# Patient Record
Sex: Male | Born: 1945 | Race: White | Hispanic: No | Marital: Married | Smoking: Current every day smoker
Health system: Southern US, Community
[De-identification: ages and names within clinical notes are randomized; demographics above are authoritative.]

## PROBLEM LIST (undated history)

## (undated) DIAGNOSIS — E785 Hyperlipidemia, unspecified: Secondary | ICD-10-CM

## (undated) DIAGNOSIS — Z720 Tobacco use: Secondary | ICD-10-CM

## (undated) DIAGNOSIS — G894 Chronic pain syndrome: Secondary | ICD-10-CM

## (undated) DIAGNOSIS — M4716 Other spondylosis with myelopathy, lumbar region: Secondary | ICD-10-CM

## (undated) DIAGNOSIS — M51369 Other intervertebral disc degeneration, lumbar region without mention of lumbar back pain or lower extremity pain: Secondary | ICD-10-CM

## (undated) DIAGNOSIS — M542 Cervicalgia: Secondary | ICD-10-CM

## (undated) DIAGNOSIS — I10 Essential (primary) hypertension: Secondary | ICD-10-CM

## (undated) DIAGNOSIS — I509 Heart failure, unspecified: Secondary | ICD-10-CM

## (undated) DIAGNOSIS — C8510 Unspecified B-cell lymphoma, unspecified site: Secondary | ICD-10-CM

## (undated) DIAGNOSIS — R7303 Prediabetes: Secondary | ICD-10-CM

## (undated) DIAGNOSIS — I739 Peripheral vascular disease, unspecified: Secondary | ICD-10-CM

## (undated) DIAGNOSIS — I2510 Atherosclerotic heart disease of native coronary artery without angina pectoris: Secondary | ICD-10-CM

## (undated) DIAGNOSIS — L8990 Pressure ulcer of unspecified site, unspecified stage: Secondary | ICD-10-CM

## (undated) DIAGNOSIS — J449 Chronic obstructive pulmonary disease, unspecified: Secondary | ICD-10-CM

## (undated) DIAGNOSIS — I350 Nonrheumatic aortic (valve) stenosis: Secondary | ICD-10-CM

## (undated) DIAGNOSIS — M5136 Other intervertebral disc degeneration, lumbar region: Secondary | ICD-10-CM

## (undated) DIAGNOSIS — G8929 Other chronic pain: Secondary | ICD-10-CM

## (undated) HISTORY — DX: Chronic obstructive pulmonary disease, unspecified: J44.9

## (undated) HISTORY — DX: Cervicalgia: M54.2

## (undated) HISTORY — DX: Tobacco use: Z72.0

## (undated) HISTORY — DX: Hyperlipidemia, unspecified: E78.5

## (undated) HISTORY — PX: VSD REPAIR: SHX276

## (undated) HISTORY — PX: CORONARY ARTERY BYPASS GRAFT: SHX141

## (undated) HISTORY — DX: Chronic pain syndrome: G89.4

## (undated) HISTORY — DX: Unspecified B-cell lymphoma, unspecified site: C85.10

## (undated) HISTORY — DX: Other intervertebral disc degeneration, lumbar region without mention of lumbar back pain or lower extremity pain: M51.369

## (undated) HISTORY — PX: CORONARY ANGIOPLASTY: SHX604

## (undated) HISTORY — DX: Other spondylosis with myelopathy, lumbar region: M47.16

## (undated) HISTORY — DX: Peripheral vascular disease, unspecified: I73.9

## (undated) HISTORY — DX: Atherosclerotic heart disease of native coronary artery without angina pectoris: I25.10

## (undated) HISTORY — DX: Heart failure, unspecified: I50.9

## (undated) HISTORY — DX: Other intervertebral disc degeneration, lumbar region: M51.36

## (undated) HISTORY — DX: Essential (primary) hypertension: I10

## (undated) HISTORY — DX: Other chronic pain: G89.29

## (undated) HISTORY — DX: Prediabetes: R73.03

## (undated) HISTORY — PX: OTHER SURGICAL HISTORY: SHX169

## (undated) HISTORY — DX: Pressure ulcer of unspecified site, unspecified stage: L89.90

## (undated) HISTORY — DX: Nonrheumatic aortic (valve) stenosis: I35.0

---

## 2004-01-30 ENCOUNTER — Ambulatory Visit: Payer: Self-pay | Admitting: Anesthesiology

## 2004-02-26 ENCOUNTER — Ambulatory Visit: Payer: Self-pay | Admitting: Anesthesiology

## 2004-04-01 ENCOUNTER — Ambulatory Visit: Payer: Self-pay | Admitting: Anesthesiology

## 2004-04-30 ENCOUNTER — Ambulatory Visit: Payer: Self-pay | Admitting: Anesthesiology

## 2004-06-04 ENCOUNTER — Ambulatory Visit: Payer: Self-pay | Admitting: Anesthesiology

## 2004-07-02 ENCOUNTER — Ambulatory Visit: Payer: Self-pay | Admitting: Anesthesiology

## 2004-07-28 ENCOUNTER — Ambulatory Visit: Payer: Self-pay | Admitting: Anesthesiology

## 2004-08-24 ENCOUNTER — Ambulatory Visit: Payer: Self-pay | Admitting: Anesthesiology

## 2004-09-24 ENCOUNTER — Ambulatory Visit: Payer: Self-pay | Admitting: Anesthesiology

## 2004-12-08 ENCOUNTER — Ambulatory Visit: Payer: Self-pay | Admitting: Anesthesiology

## 2005-01-21 ENCOUNTER — Ambulatory Visit: Payer: Self-pay | Admitting: Anesthesiology

## 2005-02-16 ENCOUNTER — Ambulatory Visit: Payer: Self-pay | Admitting: Anesthesiology

## 2005-02-25 ENCOUNTER — Ambulatory Visit: Payer: Self-pay | Admitting: Anesthesiology

## 2005-05-12 ENCOUNTER — Ambulatory Visit: Payer: Self-pay | Admitting: Anesthesiology

## 2005-06-10 ENCOUNTER — Ambulatory Visit: Payer: Self-pay | Admitting: Anesthesiology

## 2005-07-08 ENCOUNTER — Ambulatory Visit: Payer: Self-pay | Admitting: Anesthesiology

## 2005-07-28 ENCOUNTER — Ambulatory Visit: Payer: Self-pay | Admitting: Anesthesiology

## 2005-09-09 ENCOUNTER — Ambulatory Visit: Payer: Self-pay | Admitting: Anesthesiology

## 2005-09-30 ENCOUNTER — Ambulatory Visit: Payer: Self-pay | Admitting: Anesthesiology

## 2005-11-04 ENCOUNTER — Ambulatory Visit: Payer: Self-pay | Admitting: Anesthesiology

## 2005-12-09 ENCOUNTER — Ambulatory Visit: Payer: Self-pay | Admitting: Anesthesiology

## 2006-01-11 ENCOUNTER — Ambulatory Visit: Payer: Self-pay | Admitting: Anesthesiology

## 2006-02-10 ENCOUNTER — Ambulatory Visit: Payer: Self-pay | Admitting: Anesthesiology

## 2006-03-10 ENCOUNTER — Ambulatory Visit: Payer: Self-pay | Admitting: Anesthesiology

## 2006-03-30 ENCOUNTER — Ambulatory Visit: Payer: Self-pay | Admitting: Anesthesiology

## 2006-05-11 ENCOUNTER — Ambulatory Visit: Payer: Self-pay | Admitting: Anesthesiology

## 2006-06-06 ENCOUNTER — Ambulatory Visit: Payer: Self-pay | Admitting: Anesthesiology

## 2006-06-30 ENCOUNTER — Ambulatory Visit: Payer: Self-pay | Admitting: Anesthesiology

## 2006-08-08 ENCOUNTER — Ambulatory Visit: Payer: Self-pay | Admitting: Anesthesiology

## 2006-09-08 ENCOUNTER — Ambulatory Visit: Payer: Self-pay | Admitting: Anesthesiology

## 2006-10-10 ENCOUNTER — Ambulatory Visit: Payer: Self-pay | Admitting: Anesthesiology

## 2006-10-31 ENCOUNTER — Ambulatory Visit: Payer: Self-pay | Admitting: Anesthesiology

## 2006-11-28 ENCOUNTER — Ambulatory Visit: Payer: Self-pay | Admitting: Anesthesiology

## 2007-01-11 ENCOUNTER — Ambulatory Visit: Payer: Self-pay | Admitting: Anesthesiology

## 2007-02-14 ENCOUNTER — Ambulatory Visit: Payer: Self-pay | Admitting: Anesthesiology

## 2007-03-10 ENCOUNTER — Ambulatory Visit: Payer: Self-pay | Admitting: Anesthesiology

## 2007-04-05 ENCOUNTER — Ambulatory Visit: Payer: Self-pay | Admitting: Anesthesiology

## 2007-05-08 ENCOUNTER — Ambulatory Visit: Payer: Self-pay | Admitting: Anesthesiology

## 2007-06-02 ENCOUNTER — Ambulatory Visit: Payer: Self-pay | Admitting: Anesthesiology

## 2007-06-27 ENCOUNTER — Ambulatory Visit: Payer: Self-pay | Admitting: Anesthesiology

## 2007-07-27 ENCOUNTER — Ambulatory Visit: Payer: Self-pay | Admitting: Anesthesiology

## 2007-09-06 ENCOUNTER — Ambulatory Visit: Payer: Self-pay | Admitting: Anesthesiology

## 2007-10-06 ENCOUNTER — Ambulatory Visit: Payer: Self-pay | Admitting: Anesthesiology

## 2007-11-27 ENCOUNTER — Ambulatory Visit: Payer: Self-pay | Admitting: Anesthesiology

## 2008-01-30 ENCOUNTER — Ambulatory Visit: Payer: Self-pay | Admitting: Anesthesiology

## 2008-03-29 ENCOUNTER — Ambulatory Visit: Payer: Self-pay | Admitting: Anesthesiology

## 2008-05-21 ENCOUNTER — Ambulatory Visit: Payer: Self-pay | Admitting: Anesthesiology

## 2008-07-22 ENCOUNTER — Ambulatory Visit: Payer: Self-pay | Admitting: Anesthesiology

## 2008-10-09 ENCOUNTER — Ambulatory Visit: Payer: Self-pay | Admitting: Anesthesiology

## 2008-12-02 ENCOUNTER — Ambulatory Visit: Payer: Self-pay | Admitting: Anesthesiology

## 2009-02-06 ENCOUNTER — Ambulatory Visit: Payer: Self-pay | Admitting: Anesthesiology

## 2009-03-26 ENCOUNTER — Ambulatory Visit: Payer: Self-pay | Admitting: Anesthesiology

## 2009-06-04 ENCOUNTER — Ambulatory Visit: Payer: Self-pay | Admitting: Anesthesiology

## 2009-07-25 ENCOUNTER — Ambulatory Visit: Payer: Self-pay | Admitting: Anesthesiology

## 2009-09-25 ENCOUNTER — Ambulatory Visit: Payer: Self-pay | Admitting: Anesthesiology

## 2009-12-03 ENCOUNTER — Ambulatory Visit: Payer: Self-pay | Admitting: Anesthesiology

## 2010-01-26 ENCOUNTER — Ambulatory Visit: Payer: Self-pay | Admitting: Anesthesiology

## 2010-05-21 ENCOUNTER — Ambulatory Visit: Payer: Self-pay | Admitting: Anesthesiology

## 2010-07-17 ENCOUNTER — Ambulatory Visit: Payer: Self-pay | Admitting: Anesthesiology

## 2010-10-27 ENCOUNTER — Ambulatory Visit: Payer: Self-pay | Admitting: Anesthesiology

## 2011-01-25 ENCOUNTER — Ambulatory Visit: Payer: Self-pay | Admitting: Anesthesiology

## 2011-04-23 ENCOUNTER — Ambulatory Visit: Payer: Self-pay | Admitting: Anesthesiology

## 2011-07-14 ENCOUNTER — Ambulatory Visit: Payer: Self-pay | Admitting: Anesthesiology

## 2011-10-19 DIAGNOSIS — I2510 Atherosclerotic heart disease of native coronary artery without angina pectoris: Secondary | ICD-10-CM | POA: Insufficient documentation

## 2011-10-19 DIAGNOSIS — I350 Nonrheumatic aortic (valve) stenosis: Secondary | ICD-10-CM | POA: Insufficient documentation

## 2011-10-21 ENCOUNTER — Ambulatory Visit: Payer: Self-pay | Admitting: Anesthesiology

## 2012-01-10 ENCOUNTER — Ambulatory Visit: Payer: Self-pay | Admitting: Anesthesiology

## 2012-04-10 ENCOUNTER — Ambulatory Visit: Payer: Self-pay | Admitting: Anesthesiology

## 2012-06-14 ENCOUNTER — Ambulatory Visit: Payer: Self-pay | Admitting: Anesthesiology

## 2012-10-03 ENCOUNTER — Ambulatory Visit: Payer: Self-pay | Admitting: Anesthesiology

## 2013-01-02 ENCOUNTER — Ambulatory Visit: Payer: Self-pay | Admitting: Anesthesiology

## 2013-03-28 ENCOUNTER — Ambulatory Visit: Payer: Self-pay | Admitting: Anesthesiology

## 2013-07-03 ENCOUNTER — Ambulatory Visit: Payer: Self-pay | Admitting: Anesthesiology

## 2013-10-04 ENCOUNTER — Ambulatory Visit: Payer: Self-pay | Admitting: Anesthesiology

## 2013-11-21 ENCOUNTER — Ambulatory Visit: Payer: Self-pay | Admitting: Internal Medicine

## 2013-12-27 ENCOUNTER — Ambulatory Visit: Payer: Self-pay | Admitting: Anesthesiology

## 2014-04-03 ENCOUNTER — Ambulatory Visit: Payer: Self-pay | Admitting: Anesthesiology

## 2014-07-02 ENCOUNTER — Ambulatory Visit: Payer: Self-pay | Admitting: Anesthesiology

## 2014-09-26 DIAGNOSIS — M7918 Myalgia, other site: Secondary | ICD-10-CM | POA: Insufficient documentation

## 2014-09-26 DIAGNOSIS — M47816 Spondylosis without myelopathy or radiculopathy, lumbar region: Secondary | ICD-10-CM | POA: Insufficient documentation

## 2014-09-26 DIAGNOSIS — M5136 Other intervertebral disc degeneration, lumbar region: Secondary | ICD-10-CM | POA: Insufficient documentation

## 2014-09-26 NOTE — Progress Notes (Signed)
Chief complaint is low back pain  Procedure: none  History of present illness: [name] continues to do reasonably  well with current medication management. Pain continues to stay reasonably and no  significant changes are  noted in baseline symptom complex. No change in lower extremity strength or function or bowel bladder function. Based on the  narcotic assessment sheet, the  patient continues to do well with this current regimen with no evidence of diverting or illicit use.  There were no vitals taken for this visit.  No current outpatient prescriptions on file.  Patient Active Problem List   Diagnosis Date Noted  . Facet arthritis of lumbar region 09/26/2014  . DDD (degenerative disc disease), lumbar 09/26/2014  . Myofacial muscle pain 09/26/2014    Allergies not on file  Physical exam pupils are equally round and reactive to light  Extraocular muscles are intact   Heart is regular rate and rhythm and lower extremity strength and function remains a baseline with no significant changes noted.  Assessment #1 chronic low back pain #2 chronic opioid management #  Plan: We'll refill medications at present with return to clinic in the next 2-3 months for reevaluation. Patient is to continue with physical therapy exercises and aerobic conditioning as tolerated and continue follow-up with her primary care physician  Dr. Vashti Hey 7:12 PM

## 2014-09-30 ENCOUNTER — Encounter: Payer: Self-pay | Admitting: Anesthesiology

## 2014-09-30 ENCOUNTER — Ambulatory Visit: Payer: Medicare Other | Attending: Anesthesiology | Admitting: Anesthesiology

## 2014-09-30 VITALS — BP 128/48 | HR 68 | Temp 98.8°F | Resp 18 | Ht 68.0 in | Wt 169.0 lb

## 2014-09-30 DIAGNOSIS — M47816 Spondylosis without myelopathy or radiculopathy, lumbar region: Secondary | ICD-10-CM

## 2014-09-30 DIAGNOSIS — M51369 Other intervertebral disc degeneration, lumbar region without mention of lumbar back pain or lower extremity pain: Secondary | ICD-10-CM

## 2014-09-30 DIAGNOSIS — M7918 Myalgia, other site: Secondary | ICD-10-CM

## 2014-09-30 DIAGNOSIS — M545 Low back pain: Secondary | ICD-10-CM | POA: Insufficient documentation

## 2014-09-30 DIAGNOSIS — F1190 Opioid use, unspecified, uncomplicated: Secondary | ICD-10-CM | POA: Diagnosis not present

## 2014-09-30 DIAGNOSIS — M5136 Other intervertebral disc degeneration, lumbar region: Secondary | ICD-10-CM | POA: Diagnosis not present

## 2014-09-30 DIAGNOSIS — M791 Myalgia: Secondary | ICD-10-CM | POA: Diagnosis not present

## 2014-09-30 MED ORDER — FENTANYL 100 MCG/HR TD PT72: 100.0000 ug | MEDICATED_PATCH | TRANSDERMAL | Status: DC

## 2014-09-30 NOTE — Progress Notes (Signed)
Discharged to home ambulatory with script for fentaNYL PATCH  X 3 IN HAND.

## 2014-10-10 ENCOUNTER — Other Ambulatory Visit: Payer: Self-pay | Admitting: Anesthesiology

## 2014-12-09 ENCOUNTER — Encounter: Payer: Self-pay | Admitting: Anesthesiology

## 2014-12-09 ENCOUNTER — Ambulatory Visit: Payer: Medicare Other | Attending: Anesthesiology | Admitting: Anesthesiology

## 2014-12-09 VITALS — BP 122/56 | HR 93 | Temp 99.0°F | Resp 16 | Ht 69.0 in | Wt 174.0 lb

## 2014-12-09 DIAGNOSIS — I739 Peripheral vascular disease, unspecified: Secondary | ICD-10-CM | POA: Insufficient documentation

## 2014-12-09 DIAGNOSIS — F1190 Opioid use, unspecified, uncomplicated: Secondary | ICD-10-CM | POA: Diagnosis not present

## 2014-12-09 DIAGNOSIS — M545 Low back pain: Secondary | ICD-10-CM | POA: Insufficient documentation

## 2014-12-09 DIAGNOSIS — M5441 Lumbago with sciatica, right side: Secondary | ICD-10-CM

## 2014-12-09 DIAGNOSIS — M5136 Other intervertebral disc degeneration, lumbar region: Secondary | ICD-10-CM | POA: Diagnosis not present

## 2014-12-09 DIAGNOSIS — G8929 Other chronic pain: Secondary | ICD-10-CM | POA: Diagnosis not present

## 2014-12-09 DIAGNOSIS — M47816 Spondylosis without myelopathy or radiculopathy, lumbar region: Secondary | ICD-10-CM

## 2014-12-09 DIAGNOSIS — M1288 Other specific arthropathies, not elsewhere classified, other specified site: Secondary | ICD-10-CM | POA: Insufficient documentation

## 2014-12-09 MED ORDER — FENTANYL 100 MCG/HR TD PT72: 100.0000 ug | MEDICATED_PATCH | TRANSDERMAL | Status: DC

## 2014-12-09 NOTE — Patient Instructions (Signed)
A prescription for [place] X 3 was given to you today.

## 2014-12-09 NOTE — Progress Notes (Signed)
Safety precautions to be maintained throughout the outpatient stay will include: orient to surroundings, keep bed in low position, maintain call bell within reach at all times, provide assistance with transfer out of bed and ambulation.  

## 2014-12-10 NOTE — Progress Notes (Signed)
Chief complaint is low back pain  Procedure: none  History of present illness: [name] continues to do reasonably  well with current medication management. Pain continues to be stable in nature  and no  significant changes are  noted in baseline symptom complex. No change in lower extremity strength or function or bowel bladder function. Based on the  narcotic assessment sheet, the  patient continues to do well with this current regimen with no evidence of diverting or illicit use.  BP 122/56 mmHg  Pulse 93  Temp(Src) 99 F (37.2 C) (Oral)  Resp 16  Ht 5\' 9"  (1.753 m)  Wt 174 lb (78.926 kg)  BMI 25.68 kg/m2  SpO2 99%   Current outpatient prescriptions:  .  aspirin 325 MG tablet, Take 325 mg by mouth daily., Disp: , Rfl:  .  busPIRone (BUSPAR) 15 MG tablet, Take 15 mg by mouth 2 (two) times daily., Disp: , Rfl:  .  butalbital-acetaminophen-caffeine (FIORICET, ESGIC) 50-325-40 MG per tablet, Take 1 tablet by mouth 2 (two) times daily as needed for headache., Disp: , Rfl:  .  citalopram (CELEXA) 10 MG tablet, Take 10 mg by mouth daily., Disp: , Rfl:  .  docusate sodium (COLACE) 100 MG capsule, Take 100 mg by mouth 2 (two) times daily., Disp: , Rfl:  .  fentaNYL (DURAGESIC - DOSED MCG/HR) 100 MCG/HR, Place 1 patch (100 mcg total) onto the skin every other day., Disp: 15 patch, Rfl: 0 .  gemfibrozil (LOPID) 600 MG tablet, Take 600 mg by mouth 2 (two) times daily before a meal., Disp: , Rfl:  .  lisinopril (PRINIVIL,ZESTRIL) 10 MG tablet, Take 10 mg by mouth daily., Disp: , Rfl:  .  metoprolol tartrate (LOPRESSOR) 25 MG tablet, Take 25 mg by mouth daily., Disp: , Rfl:  .  Oxycodone HCl 10 MG TABS, Take by mouth. Prn, does not take every day, Disp: , Rfl:  .  tamsulosin (FLOMAX) 0.4 MG CAPS capsule, Take 0.4 mg by mouth daily., Disp: , Rfl:  .  busPIRone (BUSPAR) 10 MG tablet, Take 10 mg by mouth 3 (three) times daily., Disp: , Rfl:   Patient Active Problem List   Diagnosis Date  Noted  . Facet arthritis of lumbar region 09/26/2014  . DDD (degenerative disc disease), lumbar 09/26/2014  . Myofacial muscle pain 09/26/2014    Allergies  Allergen Reactions  . Penicillins Other (See Comments)    unknown    Physical exam pupils are equally round and reactive to light  Extraocular muscles are intact   Heart is regular rate and rhythm and lower extremity strength and function remains a baseline with no significant changes noted.  Assessment #1 chronic low back pain  #2 chronic opioid management #34facet arthropathy #4 peripheral vascular disease and history of aortic stenosis with valve repair  Plan: We'll refill medications at present with return to clinic in the next 2-3 months for reevaluation. Patient is to continue with physical therapy exercises and aerobic conditioning as tolerated and continue follow-up with her primary care physician  Dr. Vashti Hey 3:57 PM

## 2015-03-13 ENCOUNTER — Telehealth: Payer: Self-pay | Admitting: *Deleted

## 2015-03-13 NOTE — Telephone Encounter (Signed)
Per Dr. Andree Elk request : Called Dr. Doy Hutching office and informed that we give [name] fentanyl patches 151mcg -  15patches per month. Dr. Doy Hutching gives patient oxycodone and Dr. Andree Elk wanted to confirm that Dr. Doy Hutching was aware that we also give patient fentanyl.

## 2015-03-27 ENCOUNTER — Encounter: Payer: Self-pay | Admitting: Anesthesiology

## 2015-03-27 ENCOUNTER — Other Ambulatory Visit: Payer: Self-pay | Admitting: Anesthesiology

## 2015-03-27 ENCOUNTER — Ambulatory Visit: Payer: Medicare Other | Attending: Anesthesiology | Admitting: Anesthesiology

## 2015-03-27 VITALS — BP 124/52 | HR 72 | Temp 98.4°F | Resp 16 | Ht 68.0 in | Wt 176.0 lb

## 2015-03-27 DIAGNOSIS — M5136 Other intervertebral disc degeneration, lumbar region: Secondary | ICD-10-CM | POA: Insufficient documentation

## 2015-03-27 DIAGNOSIS — M5441 Lumbago with sciatica, right side: Secondary | ICD-10-CM | POA: Diagnosis not present

## 2015-03-27 DIAGNOSIS — M797 Fibromyalgia: Secondary | ICD-10-CM

## 2015-03-27 DIAGNOSIS — G8929 Other chronic pain: Secondary | ICD-10-CM | POA: Diagnosis not present

## 2015-03-27 DIAGNOSIS — M7918 Myalgia, other site: Secondary | ICD-10-CM

## 2015-03-27 DIAGNOSIS — I739 Peripheral vascular disease, unspecified: Secondary | ICD-10-CM | POA: Insufficient documentation

## 2015-03-27 DIAGNOSIS — M545 Low back pain: Secondary | ICD-10-CM | POA: Insufficient documentation

## 2015-03-27 DIAGNOSIS — Z9889 Other specified postprocedural states: Secondary | ICD-10-CM | POA: Insufficient documentation

## 2015-03-27 DIAGNOSIS — M1288 Other specific arthropathies, not elsewhere classified, other specified site: Secondary | ICD-10-CM | POA: Insufficient documentation

## 2015-03-27 DIAGNOSIS — M47816 Spondylosis without myelopathy or radiculopathy, lumbar region: Secondary | ICD-10-CM

## 2015-03-27 DIAGNOSIS — F1190 Opioid use, unspecified, uncomplicated: Secondary | ICD-10-CM | POA: Diagnosis not present

## 2015-03-27 MED ORDER — FENTANYL 100 MCG/HR TD PT72: 100.0000 ug | MEDICATED_PATCH | TRANSDERMAL | Status: DC

## 2015-03-27 NOTE — Progress Notes (Signed)
Safety precautions to be maintained throughout the outpatient stay will include: orient to surroundings, keep bed in low position, maintain call bell within reach at all times, provide assistance with transfer out of bed and ambulation.  

## 2015-03-28 NOTE — Progress Notes (Signed)
Chief complaint is low back pain  Procedure: none  History of present illness: [name] was last seen in August and continues to have baseline low back pain with radiation into the right hip and buttock and anterior thigh. The quality characteristic and distribution of his low back pain and leg pain main stable in nature with no changes noted today. He is taking his medication including the Duragesic patch applied every 2 days as recommended and this is well tolerated based on his narcotic assessment sheet. He does mention that he does receive when necessary oxycodone from Dr. Doy Hutching for additional pain affecting his shoulder and for breakthrough. He uses this approximately one or 2 tablets per day when necessary to cover breakthrough pain. He has been on opioids for a considerable period of time based on his diffuse body pain and has come quite tolerant. He denies problems with bowel or bladder function or any diverting or illicit use.    BP 124/52 mmHg  Pulse 72  Temp(Src) 98.4 F (36.9 C) (Oral)  Resp 16  Ht 5\' 8"  (1.727 m)  Wt 176 lb (79.833 kg)  BMI 26.77 kg/m2  SpO2 100%   Current outpatient prescriptions:  .  aspirin 325 MG tablet, Take 325 mg by mouth daily., Disp: , Rfl:  .  busPIRone (BUSPAR) 15 MG tablet, Take 15 mg by mouth 2 (two) times daily., Disp: , Rfl:  .  butalbital-acetaminophen-caffeine (FIORICET, ESGIC) 50-325-40 MG per tablet, Take 1 tablet by mouth 2 (two) times daily as needed for headache., Disp: , Rfl:  .  citalopram (CELEXA) 10 MG tablet, Take 10 mg by mouth daily., Disp: , Rfl:  .  docusate sodium (COLACE) 100 MG capsule, Take 100 mg by mouth 2 (two) times daily., Disp: , Rfl:  .  fentaNYL (DURAGESIC - DOSED MCG/HR) 100 MCG/HR, Place 1 patch (100 mcg total) onto the skin every other day., Disp: 15 patch, Rfl: 0 .  gemfibrozil (LOPID) 600 MG tablet, Take 600 mg by mouth 2 (two) times daily before a meal., Disp: , Rfl:  .  lisinopril (PRINIVIL,ZESTRIL) 10  MG tablet, Take 10 mg by mouth daily., Disp: , Rfl:  .  metoprolol tartrate (LOPRESSOR) 25 MG tablet, Take 25 mg by mouth daily., Disp: , Rfl:  .  Oxycodone HCl 10 MG TABS, Take by mouth. Prn, does not take every day, Disp: , Rfl:  .  tamsulosin (FLOMAX) 0.4 MG CAPS capsule, Take 0.4 mg by mouth daily., Disp: , Rfl:  .  busPIRone (BUSPAR) 10 MG tablet, Take 10 mg by mouth 3 (three) times daily., Disp: , Rfl:   Patient Active Problem List   Diagnosis Date Noted  . Facet arthritis of lumbar region 09/26/2014  . DDD (degenerative disc disease), lumbar 09/26/2014  . Myofacial muscle pain 09/26/2014    Allergies  Allergen Reactions  . Penicillins Other (See Comments)    unknown    Physical exam pupils are equally round and reactive to light  Extraocular muscles are intact   Heart is regular rate and rhythm and lower extremity strength and function remains a baseline with no significant changes noted.  Assessment  #1 chronic low back pain  #2 chronic opioid management #74facet arthropathy #4 peripheral vascular disease and history of aortic stenosis with valve repair  Plan: We'll refill medications at present with return to clinic in the next 2 months for reevaluation. He  is to continue with physical therapy exercises and aerobic conditioning as tolerated and continue  follow-up with his primary care physician, Dr. Doy Hutching. Should he have any changes in his symptom quality characteristic and distribution or strength he has been instructed to contact us for further evaluation. At this point he seems to be doing quite well with his regimen.  Dr. Vashti Hey 3:35 PM

## 2015-04-03 LAB — TOXASSURE SELECT 13 (MW), URINE

## 2015-05-21 ENCOUNTER — Ambulatory Visit: Payer: Medicare Other | Attending: Anesthesiology | Admitting: Anesthesiology

## 2015-05-21 ENCOUNTER — Encounter: Payer: Self-pay | Admitting: Anesthesiology

## 2015-05-21 VITALS — BP 124/84 | HR 70 | Temp 98.7°F | Resp 16 | Ht 68.0 in | Wt 175.0 lb

## 2015-05-21 DIAGNOSIS — M5136 Other intervertebral disc degeneration, lumbar region: Secondary | ICD-10-CM | POA: Diagnosis not present

## 2015-05-21 DIAGNOSIS — I739 Peripheral vascular disease, unspecified: Secondary | ICD-10-CM | POA: Diagnosis not present

## 2015-05-21 DIAGNOSIS — M797 Fibromyalgia: Secondary | ICD-10-CM | POA: Diagnosis not present

## 2015-05-21 DIAGNOSIS — M5441 Lumbago with sciatica, right side: Secondary | ICD-10-CM

## 2015-05-21 DIAGNOSIS — M1288 Other specific arthropathies, not elsewhere classified, other specified site: Secondary | ICD-10-CM | POA: Diagnosis not present

## 2015-05-21 DIAGNOSIS — M545 Low back pain: Secondary | ICD-10-CM | POA: Insufficient documentation

## 2015-05-21 DIAGNOSIS — F1190 Opioid use, unspecified, uncomplicated: Secondary | ICD-10-CM | POA: Insufficient documentation

## 2015-05-21 DIAGNOSIS — M4690 Unspecified inflammatory spondylopathy, site unspecified: Secondary | ICD-10-CM | POA: Diagnosis not present

## 2015-05-21 DIAGNOSIS — G8929 Other chronic pain: Secondary | ICD-10-CM | POA: Diagnosis not present

## 2015-05-21 DIAGNOSIS — M7918 Myalgia, other site: Secondary | ICD-10-CM

## 2015-05-21 DIAGNOSIS — I350 Nonrheumatic aortic (valve) stenosis: Secondary | ICD-10-CM | POA: Insufficient documentation

## 2015-05-21 DIAGNOSIS — M47816 Spondylosis without myelopathy or radiculopathy, lumbar region: Secondary | ICD-10-CM | POA: Diagnosis not present

## 2015-05-21 MED ORDER — FENTANYL 100 MCG/HR TD PT72: 100.0000 ug | MEDICATED_PATCH | TRANSDERMAL | Status: DC

## 2015-05-21 NOTE — Patient Instructions (Signed)
You were given 2 scripts for Duragesic patch today

## 2015-05-21 NOTE — Progress Notes (Signed)
Safety precautions to be maintained throughout the outpatient stay will include: orient to surroundings, keep bed in low position, maintain call bell within reach at all times, provide assistance with transfer out of bed and ambulation.  

## 2015-05-22 NOTE — Progress Notes (Signed)
Chief complaint is low back pain  Procedure: none  History of present illness: [name] was last seen in December and continues to have baseline low back pain with radiation into the right hip and buttock and anterior thigh. The quality characteristic and distribution of his low back pain and leg pain main stable in nature with no changes noted today. He is taking his medication including the Duragesic patch applied every 2 days as recommended and this is well tolerated based on his narcotic assessment sheet. He is scheduled for a vascular evaluation for some peripheral vascular disease of the lower extremities is still followed along with his primary care physicians. No new changes in lower extremity strength or function or bowel bladder function noted and is doing well with his medications based on his narcotic assessment sheet.   BP 124/84 mmHg  Pulse 70  Temp(Src) 98.7 F (37.1 C) (Oral)  Resp 16  Ht 5\' 8"  (1.727 m)  Wt 175 lb (79.379 kg)  BMI 26.61 kg/m2  SpO2 99%   Current outpatient prescriptions:  .  aspirin 325 MG tablet, Take 325 mg by mouth daily., Disp: , Rfl:  .  busPIRone (BUSPAR) 15 MG tablet, Take 15 mg by mouth 2 (two) times daily., Disp: , Rfl:  .  butalbital-acetaminophen-caffeine (FIORICET, ESGIC) 50-325-40 MG per tablet, Take 1 tablet by mouth 2 (two) times daily as needed for headache., Disp: , Rfl:  .  citalopram (CELEXA) 10 MG tablet, Take 10 mg by mouth daily., Disp: , Rfl:  .  docusate sodium (COLACE) 100 MG capsule, Take 100 mg by mouth 2 (two) times daily., Disp: , Rfl:  .  fentaNYL (DURAGESIC - DOSED MCG/HR) 100 MCG/HR, Place 1 patch (100 mcg total) onto the skin every other day. Fill on [id number], Disp: 15 patch, Rfl: 0 .  gemfibrozil (LOPID) 600 MG tablet, Take 600 mg by mouth 2 (two) times daily before a meal., Disp: , Rfl:  .  lisinopril (PRINIVIL,ZESTRIL) 10 MG tablet, Take 10 mg by mouth daily., Disp: , Rfl:  .  metoprolol tartrate (LOPRESSOR) 25  MG tablet, Take 25 mg by mouth daily., Disp: , Rfl:  .  Oxycodone HCl 10 MG TABS, Take by mouth. Prn, does not take every day, Disp: , Rfl:  .  tamsulosin (FLOMAX) 0.4 MG CAPS capsule, Take 0.4 mg by mouth daily., Disp: , Rfl:  .  busPIRone (BUSPAR) 10 MG tablet, Take 10 mg by mouth 3 (three) times daily. Reported on 05/21/2015, Disp: , Rfl:   Patient Active Problem List   Diagnosis Date Noted  . Facet arthritis of lumbar region 09/26/2014  . DDD (degenerative disc disease), lumbar 09/26/2014  . Myofacial muscle pain 09/26/2014    Allergies  Allergen Reactions  . Penicillins Other (See Comments)    unknown    Physical exam pupils are equally round and reactive to light  Extraocular muscles are intact   Heart is regular rate and rhythm and lower extremity strength and function remains a baseline with no significant changes noted.  Assessment  #1 chronic low back pain  #2 chronic opioid management #90facet arthropathy #4 peripheral vascular disease and history of aortic stenosis with valve repair  Plan: We'll refill medications at present with return to clinic in the next 2 months for reevaluation. He  is to continue with physical therapy exercises and aerobic conditioning as tolerated and continue follow-up with his primary care physician, Dr. Doy Hutching. Should he have any changes in his symptom quality  characteristic and distribution or strength he has been instructed to contact us for further evaluation. At this point he seems to be doing quite well with his regimen.  Dr. Vashti Hey 9:58 AM

## 2015-07-17 ENCOUNTER — Encounter: Payer: Self-pay | Admitting: Anesthesiology

## 2015-07-17 ENCOUNTER — Ambulatory Visit: Payer: Medicare Other | Attending: Anesthesiology | Admitting: Anesthesiology

## 2015-07-17 VITALS — BP 107/75 | HR 74 | Temp 98.4°F | Resp 16 | Ht 68.5 in | Wt 175.0 lb

## 2015-07-17 DIAGNOSIS — M47816 Spondylosis without myelopathy or radiculopathy, lumbar region: Secondary | ICD-10-CM

## 2015-07-17 DIAGNOSIS — M5136 Other intervertebral disc degeneration, lumbar region: Secondary | ICD-10-CM | POA: Diagnosis not present

## 2015-07-17 DIAGNOSIS — M797 Fibromyalgia: Secondary | ICD-10-CM | POA: Diagnosis not present

## 2015-07-17 DIAGNOSIS — G8929 Other chronic pain: Secondary | ICD-10-CM | POA: Insufficient documentation

## 2015-07-17 DIAGNOSIS — I739 Peripheral vascular disease, unspecified: Secondary | ICD-10-CM | POA: Insufficient documentation

## 2015-07-17 DIAGNOSIS — M791 Myalgia: Secondary | ICD-10-CM | POA: Insufficient documentation

## 2015-07-17 DIAGNOSIS — M4696 Unspecified inflammatory spondylopathy, lumbar region: Secondary | ICD-10-CM | POA: Insufficient documentation

## 2015-07-17 DIAGNOSIS — F1120 Opioid dependence, uncomplicated: Secondary | ICD-10-CM | POA: Insufficient documentation

## 2015-07-17 DIAGNOSIS — M545 Low back pain: Secondary | ICD-10-CM | POA: Diagnosis present

## 2015-07-17 DIAGNOSIS — I350 Nonrheumatic aortic (valve) stenosis: Secondary | ICD-10-CM | POA: Diagnosis not present

## 2015-07-17 DIAGNOSIS — M5441 Lumbago with sciatica, right side: Secondary | ICD-10-CM | POA: Diagnosis not present

## 2015-07-17 DIAGNOSIS — M7918 Myalgia, other site: Secondary | ICD-10-CM

## 2015-07-17 MED ORDER — FENTANYL 100 MCG/HR TD PT72: 100.0000 ug | MEDICATED_PATCH | TRANSDERMAL | Status: DC

## 2015-07-17 NOTE — Progress Notes (Signed)
Patient here for medication management Safety precautions to be maintained throughout the outpatient stay will include: orient to surroundings, keep bed in low position, maintain call bell within reach at all times, provide assistance with transfer out of bed and ambulation.  

## 2015-07-18 NOTE — Progress Notes (Signed)
Chief complaint is low back pain  Procedure: none  History of present illness: [name] presents today for reevaluation. He was last seen 2 months ago and pain control Center. The quality characteristic distribution pain continued to remain stable. He is taking his medications as prescribed. No significant changes are noted for reevaluation today. He is scheduled for a CT angiogram for lower extremity peripheral vascular disease in the near future.    BP 107/75 mmHg  Pulse 74  Temp(Src) 98.4 F (36.9 C) (Oral)  Resp 16  Ht 5' 8.5" (1.74 m)  Wt 175 lb (79.379 kg)  BMI 26.22 kg/m2  SpO2 100%   Current outpatient prescriptions:  .  aspirin 325 MG tablet, Take 325 mg by mouth daily., Disp: , Rfl:  .  busPIRone (BUSPAR) 15 MG tablet, Take 15 mg by mouth 2 (two) times daily., Disp: , Rfl:  .  butalbital-acetaminophen-caffeine (FIORICET, ESGIC) 50-325-40 MG per tablet, Take 1 tablet by mouth 2 (two) times daily as needed for headache., Disp: , Rfl:  .  citalopram (CELEXA) 10 MG tablet, Take 10 mg by mouth daily., Disp: , Rfl:  .  docusate sodium (COLACE) 100 MG capsule, Take 100 mg by mouth 2 (two) times daily., Disp: , Rfl:  .  gemfibrozil (LOPID) 600 MG tablet, Take 600 mg by mouth 2 (two) times daily before a meal., Disp: , Rfl:  .  lisinopril (PRINIVIL,ZESTRIL) 10 MG tablet, Take 10 mg by mouth daily., Disp: , Rfl:  .  metoprolol tartrate (LOPRESSOR) 25 MG tablet, Take 25 mg by mouth daily., Disp: , Rfl:  .  Oxycodone HCl 10 MG TABS, Take by mouth. Prn, does not take every day, Disp: , Rfl:  .  tamsulosin (FLOMAX) 0.4 MG CAPS capsule, Take 0.4 mg by mouth daily., Disp: , Rfl:  .  busPIRone (BUSPAR) 10 MG tablet, Take 10 mg by mouth 3 (three) times daily. Reported on 07/17/2015, Disp: , Rfl:  .  fentaNYL (DURAGESIC - DOSED MCG/HR) 100 MCG/HR, Place 1 patch (100 mcg total) onto the skin every other day., Disp: 15 patch, Rfl: 0  Patient Active Problem List   Diagnosis Date Noted  .  Facet arthritis of lumbar region 09/26/2014  . DDD (degenerative disc disease), lumbar 09/26/2014  . Myofacial muscle pain 09/26/2014    Allergies  Allergen Reactions  . Penicillins Other (See Comments)    unknown    Physical exam pupils are equally round and reactive to light  Extraocular muscles are intact   Heart is regular rate and rhythm and lower extremity strength and function remains a baseline with no significant changes noted.  His right leg strength compared to left slightly diminished which is his baseline. Otherwise no change on examination noted  Assessment  #1 chronic low back pain  #2 chronic opioid management #32facet arthropathy #4 peripheral vascular disease and history of aortic stenosis with valve repair  Plan: We'll refill medications at present with return to clinic in the next 2 months for reevaluation. He  is to continue with physical therapy exercises and aerobic conditioning as tolerated and continue follow-up with his primary care physician, Dr. Doy Hutching. Should he have any changes in his symptom quality characteristic and distribution or strength he has been instructed to contact us for further evaluation. At this point he seems to be doing quite well with his regimen.  Dr. Vashti Hey 8:24 AM

## 2015-09-11 ENCOUNTER — Encounter: Payer: Self-pay | Admitting: Anesthesiology

## 2015-09-11 ENCOUNTER — Ambulatory Visit: Payer: Medicare Other | Attending: Anesthesiology | Admitting: Anesthesiology

## 2015-09-11 VITALS — BP 129/59 | HR 74 | Temp 98.9°F | Resp 14 | Ht 68.0 in | Wt 172.0 lb

## 2015-09-11 DIAGNOSIS — M5136 Other intervertebral disc degeneration, lumbar region: Secondary | ICD-10-CM | POA: Insufficient documentation

## 2015-09-11 DIAGNOSIS — M7918 Myalgia, other site: Secondary | ICD-10-CM

## 2015-09-11 DIAGNOSIS — G8929 Other chronic pain: Secondary | ICD-10-CM | POA: Insufficient documentation

## 2015-09-11 DIAGNOSIS — M5441 Lumbago with sciatica, right side: Secondary | ICD-10-CM | POA: Diagnosis not present

## 2015-09-11 DIAGNOSIS — L8990 Pressure ulcer of unspecified site, unspecified stage: Secondary | ICD-10-CM

## 2015-09-11 DIAGNOSIS — M1288 Other specific arthropathies, not elsewhere classified, other specified site: Secondary | ICD-10-CM | POA: Diagnosis not present

## 2015-09-11 DIAGNOSIS — I350 Nonrheumatic aortic (valve) stenosis: Secondary | ICD-10-CM | POA: Diagnosis not present

## 2015-09-11 DIAGNOSIS — M47816 Spondylosis without myelopathy or radiculopathy, lumbar region: Secondary | ICD-10-CM | POA: Diagnosis not present

## 2015-09-11 DIAGNOSIS — M545 Low back pain: Secondary | ICD-10-CM | POA: Insufficient documentation

## 2015-09-11 DIAGNOSIS — I739 Peripheral vascular disease, unspecified: Secondary | ICD-10-CM | POA: Diagnosis not present

## 2015-09-11 DIAGNOSIS — M797 Fibromyalgia: Secondary | ICD-10-CM | POA: Diagnosis not present

## 2015-09-11 MED ORDER — FENTANYL 100 MCG/HR TD PT72: 100.0000 ug | MEDICATED_PATCH | TRANSDERMAL | Status: DC

## 2015-09-11 MED ORDER — MEPERIDINE HCL 50 MG PO TABS: 50.0000 mg | ORAL_TABLET | ORAL | Status: DC

## 2015-09-11 NOTE — Patient Instructions (Signed)
You were given 1 prescription for Meperidine and 2 prescriptions for Duragesic patch.

## 2015-09-11 NOTE — Progress Notes (Signed)
Safety precautions to be maintained throughout the outpatient stay will include: orient to surroundings, keep bed in low position, maintain call bell within reach at all times, provide assistance with transfer out of bed and ambulation.  #18 demerol 50 mg wasted in toilet with patient as witness and DW, DM, CG.

## 2015-09-15 NOTE — Progress Notes (Signed)
Chief complaint is low back pain  Procedure: none  History of present illness: [name] presents today for reevaluation. He was last seen 2 months ago and pain control Center. The quality characteristic distribution pain continued to remain stable. He is taking his medications as prescribed. No significant changes are noted for reevaluation today. Otherwise no changes to mention today and is doing well with his medications based on his narcotic assessment sheet. He has asked that we refill his meperidine and he is brought his previous tablets hear that he uses for breakthrough pain.    BP 129/59 mmHg  Pulse 74  Temp(Src) 98.9 F (37.2 C) (Oral)  Resp 14  Ht 5\' 8"  (1.727 m)  Wt 172 lb (78.019 kg)  BMI 26.16 kg/m2  SpO2 99%   Current outpatient prescriptions:  .  aspirin 325 MG tablet, Take 325 mg by mouth daily., Disp: , Rfl:  .  busPIRone (BUSPAR) 15 MG tablet, Take 15 mg by mouth 3 (three) times daily. , Disp: , Rfl:  .  butalbital-acetaminophen-caffeine (FIORICET, ESGIC) 50-325-40 MG per tablet, Take 1 tablet by mouth 2 (two) times daily as needed for headache., Disp: , Rfl:  .  citalopram (CELEXA) 10 MG tablet, Take 10 mg by mouth daily., Disp: , Rfl:  .  clotrimazole-betamethasone (LOTRISONE) cream, Apply 1 application topically 2 (two) times daily., Disp: , Rfl:  .  docusate sodium (COLACE) 100 MG capsule, Take 100 mg by mouth 2 (two) times daily., Disp: , Rfl:  .  fentaNYL (DURAGESIC - DOSED MCG/HR) 100 MCG/HR, Place 1 patch (100 mcg total) onto the skin every other day., Disp: 15 patch, Rfl: 0 .  gemfibrozil (LOPID) 600 MG tablet, Take 600 mg by mouth 2 (two) times daily before a meal., Disp: , Rfl:  .  lisinopril (PRINIVIL,ZESTRIL) 10 MG tablet, Take 10 mg by mouth daily., Disp: , Rfl:  .  metoprolol tartrate (LOPRESSOR) 25 MG tablet, Take 25 mg by mouth daily., Disp: , Rfl:  .  Oxycodone HCl 10 MG TABS, Take by mouth. Prn, does not take every day, Disp: , Rfl:  .   tamsulosin (FLOMAX) 0.4 MG CAPS capsule, Take 0.4 mg by mouth daily., Disp: , Rfl:  .  busPIRone (BUSPAR) 10 MG tablet, Take 10 mg by mouth 3 (three) times daily. Reported on 09/11/2015, Disp: , Rfl:  .  meperidine (DEMEROL) 50 MG tablet, Take 1 tablet (50 mg total) by mouth 1 day or 1 dose., Disp: 30 tablet, Rfl: 0  Patient Active Problem List   Diagnosis Date Noted  . Facet arthritis of lumbar region 09/26/2014  . DDD (degenerative disc disease), lumbar 09/26/2014  . Myofacial muscle pain 09/26/2014    Allergies  Allergen Reactions  . Celebrex [Celecoxib] Other (See Comments)    Chest pain  . Penicillins Other (See Comments)    unknown    Physical exam pupils are equally round and reactive to light  Extraocular muscles are intact   Heart is regular rate and rhythm and lower extremity strength and function remains a baseline with no significant changes noted.  His right leg strength compared to left slightly diminished which is his baseline. Otherwise no change on examination noted  Assessment  #1 chronic low back pain  #2 chronic opioid management #663facet arthropathy #4 peripheral vascular disease and history of aortic stenosis with valve repair  Plan: We'll refill medications at present with return to clinic in the next 2 months for reevaluation. He  is to  continue with physical therapy exercises and aerobic conditioning as tolerated and continue follow-up with his primary care physician, Dr. Doy Hutching. Should he have any changes in his symptom quality characteristic and distribution or strength he has been instructed to contact us for further evaluation. At this point he seems to be doing quite well with his regimen.  Dr. Vashti Hey 9:24 AM

## 2015-09-20 LAB — TOXASSURE SELECT 13 (MW), URINE

## 2015-09-29 ENCOUNTER — Encounter: Payer: Medicare Other | Attending: Surgery | Admitting: Surgery

## 2015-09-29 DIAGNOSIS — I509 Heart failure, unspecified: Secondary | ICD-10-CM | POA: Insufficient documentation

## 2015-09-29 DIAGNOSIS — I4891 Unspecified atrial fibrillation: Secondary | ICD-10-CM | POA: Diagnosis not present

## 2015-09-29 DIAGNOSIS — F17218 Nicotine dependence, cigarettes, with other nicotine-induced disorders: Secondary | ICD-10-CM | POA: Diagnosis not present

## 2015-09-29 DIAGNOSIS — Z880 Allergy status to penicillin: Secondary | ICD-10-CM | POA: Diagnosis not present

## 2015-09-29 DIAGNOSIS — L89153 Pressure ulcer of sacral region, stage 3: Secondary | ICD-10-CM | POA: Insufficient documentation

## 2015-09-29 DIAGNOSIS — G894 Chronic pain syndrome: Secondary | ICD-10-CM | POA: Insufficient documentation

## 2015-09-29 DIAGNOSIS — I739 Peripheral vascular disease, unspecified: Secondary | ICD-10-CM | POA: Insufficient documentation

## 2015-09-29 DIAGNOSIS — J449 Chronic obstructive pulmonary disease, unspecified: Secondary | ICD-10-CM | POA: Insufficient documentation

## 2015-09-29 DIAGNOSIS — I350 Nonrheumatic aortic (valve) stenosis: Secondary | ICD-10-CM | POA: Insufficient documentation

## 2015-09-29 DIAGNOSIS — I110 Hypertensive heart disease with heart failure: Secondary | ICD-10-CM | POA: Diagnosis not present

## 2015-09-29 DIAGNOSIS — I2510 Atherosclerotic heart disease of native coronary artery without angina pectoris: Secondary | ICD-10-CM | POA: Diagnosis not present

## 2015-09-29 NOTE — Progress Notes (Signed)
[name] ([id number]) Visit Report for 09/29/2015 Chief Complaint Document Details Patient Name: [name], [name] Date of Service: 09/29/2015 9:30 AM Medical Record Number: [id number] Patient Account Number: [id number] Date of Birth/Sex: 1946-04-19 (70 y.o. Male) Treating RN: [name] Primary Care Physician: [name] Other Clinician: Referring Physician: Fulton [name] Treating Physician/Extender: [name] in Treatment: 0 Information Obtained from: Patient Chief Complaint Patient presents to the wound care center for a consult due non healing wound for about 4 months now Electronic Signature(s) Signed: 09/29/2015 10:59:33 AM By: Christin Fudge MD, FACS Entered By: Christin Fudge on 09/29/2015 10:59:33 [name] ([id number]) -------------------------------------------------------------------------------- HPI Details Patient Name: [name], [name] Date of Service: 09/29/2015 9:30 AM Medical Record Number: [id number] Patient Account Number: [id number] Date of Birth/Sex: 1946/01/28 (70 y.o. Male) Treating RN: [name] Primary Care Physician: [name] Other Clinician: Referring Physician: Fulton [name] Treating Physician/Extender: [name] in Treatment: 0 History of Present Illness Location: ulcerated areas in the region of the sacrum bilaterally Quality: Patient reports experiencing a shooting pain to affected area(s). Severity: Patient states wound are getting worse. Duration: Patient has had the wound for > 3 months prior to seeking treatment at the wound center Timing: Pain in wound is constant (hurts all the time) Context: The wound would happen gradually Modifying Factors: Other treatment(s) tried include:has been treated by his PCP and has already been on chronic pain medications Associated Signs and Symptoms: Patient reports having difficulty standing for long periods. HPI Description: 70 year old gentleman  who was referred to [place] by his PCP Dr. Fulton [name] for decubitus ulcers in the region of the sacrum which are very painful and these had them for about 4 months now. The patient's past medical history includes aortic stenosis, atrial fibrillation, coronary artery disease, CHF, COPD, coronary artery disease, prediabetes, peripheral vascular disease, nicotine addiction. In the past he's had carotid artery angioplasty and coronary artery bypass graft and VSD repair. He now has a chronic hip condition which is causing him a lot of pain and he goes to a pain clinic for a long while for this. He's been a smoker for over 40 years and smokes about a pack of cigarettes a day. His last hemoglobin A1c done in February of this year was 5.7 Electronic Signature(s) Signed: 09/29/2015 11:02:23 AM By: Christin Fudge MD, FACS Entered By: Christin Fudge on 09/29/2015 11:02:23 [name] ([id number]) -------------------------------------------------------------------------------- Physical Exam Details Patient Name: [name], [name] Date of Service: 09/29/2015 9:30 AM Medical Record Number: [id number] Patient Account Number: [id number] Date of Birth/Sex: Jun 28, 1945 (70 y.o. Male) Treating RN: [name] Primary Care Physician: [name] Other Clinician: Referring Physician: Fulton [name] Treating Physician/Extender: [name] in Treatment: 0 Constitutional . Pulse regular. Respirations normal and unlabored. Afebrile. . Eyes Nonicteric. Reactive to light. Ears, Nose, Mouth, and Throat Lips, teeth, and gums WNL.Marland Kitchen Moist mucosa without lesions. Neck supple and nontender. No palpable supraclavicular or cervical adenopathy. Normal sized without goiter. Respiratory WNL. No retractions.. Cardiovascular Pedal Pulses WNL. No clubbing, cyanosis or edema. Gastrointestinal (GI) Abdomen without masses or tenderness.. No liver or spleen enlargement or tenderness.. Lymphatic No  adneopathy. No adenopathy. No adenopathy. Musculoskeletal Adexa without tenderness or enlargement.. Digits and nails w/o clubbing, cyanosis, infection, petechiae, ischemia, or inflammatory conditions.. Integumentary (Hair, Skin) No suspicious lesions. No crepitus or fluctuance. No peri-wound warmth or erythema. No masses.Marland Kitchen Psychiatric Judgement and insight Intact.. No evidence of depression, anxiety, or agitation.. Notes bilateral sacral region and gluteal areas he's got  stage III pressure ulcers with significant amount of tenderness and slough. No debridement was possible today. There is no surrounding erythema. Electronic Signature(s) Signed: 09/29/2015 11:02:57 AM By: Christin Fudge MD, FACS Entered By: Christin Fudge on 09/29/2015 11:02:57 [name] ([id number]) -------------------------------------------------------------------------------- Physician Orders Details Patient Name: [name], [name] Date of Service: 09/29/2015 9:30 AM Medical Record Number: [id number] Patient Account Number: [id number] Date of Birth/Sex: December 03, 1945 (70 y.o. Male) Treating RN: [name] Primary Care Physician: [name] Other Clinician: Referring Physician: Fulton [name] Treating Physician/Extender: [name] in Treatment: 0 Verbal / Phone Orders: Yes Clinician: Pinkerton, Debi Read Back and Verified: Yes Diagnosis Coding Wound Cleansing Wound #1 Left Gluteus o Clean wound with Normal Saline. o Cleanse wound with mild soap and water o May Shower, gently pat wound dry prior to applying new dressing. Wound #2 Right Gluteus o Clean wound with Normal Saline. o Cleanse wound with mild soap and water o May Shower, gently pat wound dry prior to applying new dressing. Anesthetic Wound #1 Left Gluteus o Topical Lidocaine 4% cream applied to wound bed prior to debridement - for clinic use Wound #2 Right Gluteus o Topical Lidocaine 4% cream applied to  wound bed prior to debridement - for clinic use Skin Barriers/Peri-Wound Care Wound #1 Left Gluteus o Skin Prep o Barrier cream Wound #2 Right Gluteus o Skin Prep o Barrier cream Primary Wound Dressing Wound #1 Left Gluteus o Santyl Ointment Wound #2 Right Gluteus o Santyl Ointment Secondary Dressing Wound #1 Left Gluteus Laneve, Zell B. ([id number]) o Dry Gauze o Boardered Foam Dressing Wound #2 Right Gluteus o Dry Gauze o Boardered Foam Dressing Dressing Change Frequency Wound #1 Left Gluteus o Change dressing every day. Wound #2 Right Gluteus o Change dressing every day. Follow-up Appointments Wound #1 Left Gluteus o Return Appointment in 1 week. Wound #2 Right Gluteus o Return Appointment in 1 week. Off-Loading Wound #1 Left Gluteus o Turn and reposition every 2 hours Wound #2 Right Gluteus o Turn and reposition every 2 hours Additional Orders / Instructions Wound #1 Left Gluteus o Stop Smoking o Increase protein intake. Wound #2 Right Gluteus o Stop Smoking o Increase protein intake. Medications-please add to medication list. Wound #1 Left Gluteus o Other: - Vitamin A, Vitamin C, Zinc, Multivitamins Wound #2 Right Gluteus o Santyl Enzymatic Ointment o Other: - Vitamin A, Vitamin C, Zinc, Multivitamins Patient Medications [name], [name] ([id number]) Allergies: Celebrex, penicillin Notifications Medication Indication Start End Santyl 09/29/2015 DOSE topical 250 unit/gram ointment - ointment topical as directed Electronic Signature(s) Signed: 09/29/2015 12:00:29 PM By: Christin Fudge MD, FACS Entered By: Christin Fudge on 09/29/2015 12:00:28 [name] ([id number]) -------------------------------------------------------------------------------- Prescription 09/29/2015 Patient Name: [name], [name] Physician: Christin Fudge MD Date of Birth: 07-Oct-1945 NPI#: [id number] Sex: Valeta Harms:  [id number] Phone #: [phone] License #: Patient Address: [place] [address] [place], [place] [address] [facility] [address], [place], [place] [address] [phone] Allergies Celebrex Reaction: chest pain Severity: Severe penicillin Reaction: unknown (childhood) Severity: Severe Physician's Orders Santyl Enzymatic Ointment Signature(s): Date(s): Electronic Signature(s) Signed: 09/29/2015 4:01:06 PM By: Christin Fudge MD, FACS Entered By: Christin Fudge on 09/29/2015 12:00:30 [name] ([id number]) --------------------------------------------------------------------------------  Problem List Details Patient Name: [name], [name] Date of Service: 09/29/2015 9:30 AM Medical Record Number: [id number] Patient Account Number: [id number] Date of Birth/Sex: 19-Sep-1945 (70 y.o. Male) Treating RN: [name] Primary Care Physician: [name] Other Clinician: Referring Physician:  Fulton [name] Treating Physician/Extender: [name] in Treatment: 0 Active Problems ICD-10 Encounter Code Description Active Date Diagnosis L89.153 Pressure ulcer of sacral region, stage 3 09/29/2015 Yes F17.218 Nicotine dependence, cigarettes, with other nicotine- 09/29/2015 Yes induced disorders G89.4 Chronic pain syndrome 09/29/2015 Yes Inactive Problems Resolved Problems Electronic Signature(s) Signed: 09/29/2015 10:59:11 AM By: Christin Fudge MD, FACS Entered By: Christin Fudge on 09/29/2015 10:59:10 [name] ([id number]) -------------------------------------------------------------------------------- Progress Note Details Patient Name: [name], [name] Date of Service: 09/29/2015 9:30 AM Medical Record Number: [id number] Patient Account Number: [id number] Date of Birth/Sex: [birth year] (70 y.o. Male) Treating RN: [name] Primary Care Physician: [name] Other  Clinician: Referring Physician: Fulton [name] Treating Physician/Extender: [name] in Treatment: 0 Subjective Chief Complaint Information obtained from Patient Patient presents to the wound care center for a consult due non healing wound for about 4 months now History of Present Illness (HPI) The following HPI elements were documented for the patient's wound: Location: ulcerated areas in the region of the sacrum bilaterally Quality: Patient reports experiencing a shooting pain to affected area(s). Severity: Patient states wound are getting worse. Duration: Patient has had the wound for > 3 months prior to seeking treatment at the wound center Timing: Pain in wound is constant (hurts all the time) Context: The wound would happen gradually Modifying Factors: Other treatment(s) tried include:has been treated by his PCP and has already been on chronic pain medications Associated Signs and Symptoms: Patient reports having difficulty standing for long periods. 69 year old gentleman who was referred to [place] by his PCP Dr. Fulton [name] for decubitus ulcers in the region of the sacrum which are very painful and these had them for about 4 months now. The patient's past medical history includes aortic stenosis, atrial fibrillation, coronary artery disease, CHF, COPD, coronary artery disease, prediabetes, peripheral vascular disease, nicotine addiction. In the past he's had carotid artery angioplasty and coronary artery bypass graft and VSD repair. He now has a chronic hip condition which is causing him a lot of pain and he goes to a pain clinic for a long while for this. He's been a smoker for over 40 years and smokes about a pack of cigarettes a day. His last hemoglobin A1c done in February of this year was 5.7 Wound History Patient presents with 1 open wound that has been present for approximately 4 months. Patient has been treating wound in the following manner: lotrisone,  aspercream wiht lidocaine. Laboratory tests have not been performed in the last month. Patient reportedly has not tested positive for an antibiotic resistant organism. Patient reportedly has not tested positive for osteomyelitis. Patient reportedly has had testing performed to evaluate circulation in the legs. Patient experiences the following problems associated with their wounds: infection, swelling. Patient History Information obtained from Patient, . Allergies Nelligan, Lavaris B. ([id number]) Celebrex (Severity: Severe, Reaction: chest pain), penicillin (Severity: Severe, Reaction: unknown (childhood)) Family History Diabetes - Siblings, Heart Disease - Siblings, Mother, Father, Paternal Grandparents, Maternal Grandparents, Kidney Disease - Mother, Seizures - Siblings, Thyroid Problems - Siblings, No family history of Cancer, Hereditary Spherocytosis, Hypertension, Lung Disease, Stroke, Tuberculosis. Social History Current some day smoker, Marital Status - Married, Alcohol Use - Never, Drug Use - No History, Caffeine Use - Daily. Medical History Respiratory Patient has history of Chronic Obstructive Pulmonary Disease (COPD) Cardiovascular Patient has history of Arrhythmia - A-FIB, Congestive Heart Failure, Coronary Artery Disease, Hypertension, Peripheral Venous Disease Denies history of Deep Vein Thrombosis Musculoskeletal Patient has history of Osteoarthritis  Review of Systems (ROS) Constitutional Symptoms (General Health) The patient has no complaints or symptoms. Eyes The patient has no complaints or symptoms. Ear/Nose/Mouth/Throat The patient has no complaints or symptoms. Hematologic/Lymphatic The patient has no complaints or symptoms. Cardiovascular aortic stenosis heart murmur hyperlipidemia Gastrointestinal The patient has no complaints or symptoms. Endocrine hx of DM pre DM II Genitourinary problems passing urine Immunological The patient has no complaints or  symptoms. Integumentary (Skin) Complains or has symptoms of Wounds. Musculoskeletal degenerative disc carpal tunnel Neurologic The patient has no complaints or symptoms. Oncologic The patient has no complaints or symptoms. Psychiatric GRAPER, [name] ([id number]) Complains or has symptoms of Anxiety. Medications Fiorinal 50 mg-325 mg-40 mg capsule oral 2 2 capsule oral four times daily aspirin 325 mg tablet,delayed release oral 1 1 tablet,delayed release (DR/EC) oral daily fentanyl 100 mcg/hr transdermal patch transdermal 1 1 patch 72 hour transdermal every other day oxycodone 10 mg tablet oral 1 1 tablet oral evey four hours as needed buspirone 15 mg tablet oral 1 1 tablet oral three times daily Valium 5 mg tablet oral 1 1 tablet oral every six hours as needed atorvastatin 10 mg tablet oral 1 1 tablet oral daily lisinopril 10 mg tablet oral 1 1 tablet oral daily tamsulosin 0.4 mg capsule oral 1 1 capsule,extended release 24hr oral daily metoprolol tartrate 25 mg tablet oral 1 1 tablet oral two times daily docusate sodium 100 mg capsule oral 1 1 capsule oral two times daily gemfibrozil 600 mg tablet oral 1 1 tablet oral two times daily before meals citalopram 20 mg tablet oral 1 1 tablet oral daily clotrimazole-betamethasone 1 %-0.05 % topical cream topical cream topical apply two times daily nitroglycerin 0.4 mg sublingual tablet sublingual 1 1 tablet, sublingual every five minutes up to three doses Objective Constitutional Pulse regular. Respirations normal and unlabored. Afebrile. Vitals Time Taken: 9:32 AM, Height: 68 in, Source: Stated, Weight: 175 lbs, Source: Stated, BMI: 26.6, Temperature: 98.6 F, Pulse: 77 bpm, Respiratory Rate: 20 breaths/min, Blood Pressure: 115/71 mmHg. Eyes Nonicteric. Reactive to light. Ears, Nose, Mouth, and Throat Lips, teeth, and gums WNL.Marland Kitchen Moist mucosa without lesions. Neck supple and nontender. No palpable supraclavicular or cervical  adenopathy. Normal sized without goiter. Respiratory WNL. No retractions.. Cardiovascular Pedal Pulses WNL. No clubbing, cyanosis or edema. [name], [name] ([id number]) Gastrointestinal (GI) Abdomen without masses or tenderness.. No liver or spleen enlargement or tenderness.. Lymphatic No adneopathy. No adenopathy. No adenopathy. Musculoskeletal Adexa without tenderness or enlargement.. Digits and nails w/o clubbing, cyanosis, infection, petechiae, ischemia, or inflammatory conditions.Marland Kitchen Psychiatric Judgement and insight Intact.. No evidence of depression, anxiety, or agitation.. General Notes: bilateral sacral region and gluteal areas he's got stage III pressure ulcers with significant amount of tenderness and slough. No debridement was possible today. There is no surrounding erythema. Integumentary (Hair, Skin) No suspicious lesions. No crepitus or fluctuance. No peri-wound warmth or erythema. No masses.. Wound #1 status is Open. Original cause of wound was Gradually Appeared. The wound is located on the Left Gluteus. The wound measures 7cm length x 1.5cm width x 0.1cm depth; 8.247cm^2 area and 0.825cm^3 volume. The wound is limited to skin breakdown. There is no tunneling or undermining noted. There is a large amount of serous drainage noted. The wound margin is flat and intact. There is small (1-33%) granulation within the wound bed. There is a large (67-100%) amount of necrotic tissue within the wound bed including Eschar. The periwound skin appearance exhibited: Localized Edema, Moist, Erythema. The surrounding wound  skin color is noted with erythema which is circumferential. Periwound temperature was noted as No Abnormality. The periwound has tenderness on palpation. Wound #2 status is Open. Original cause of wound was Gradually Appeared. The wound is located on the Right Gluteus. The wound measures 6cm length x 3.3cm width x 0.1cm depth; 15.551cm^2 area and 1.555cm^3 volume.  The wound is limited to skin breakdown. There is no tunneling or undermining noted. There is a large amount of serous drainage noted. The wound margin is flat and intact. There is small (1-33%) granulation within the wound bed. The periwound skin appearance exhibited: Localized Edema, Moist, Erythema. The surrounding wound skin color is noted with erythema which is circumferential. Periwound temperature was noted as No Abnormality. The periwound has tenderness on palpation. Assessment Active Problems ICD-10 L89.153 - Pressure ulcer of sacral region, stage 3 F17.218 - Nicotine dependence, cigarettes, with other nicotine-induced disorders G89.4 - Chronic pain syndrome PETIE, [name] ([id number]) this 70 year old gentleman who has chronic pain syndrome and has bilateral hip problems which restrict his movements and he spending several hours of the day sitting in a recliner and causing a lot of pressure injury to his lower sacrum and gluteal area. I have recommended: 1. Offloading and details of this have been discussed with him at great lengths 2. Santyl ointment locally and protecting it with a bordered foam dressing. 3. Adequate protein nutrition and vitamin A, vitamin C, zinc supplements 4. Spent over 3 minutes of time discussing the need to completely give up smoking. he and his wife have had all questions answered and the patient says she's been to be compliant Plan Wound Cleansing: Wound #1 Left Gluteus: Clean wound with Normal Saline. Cleanse wound with mild soap and water May Shower, gently pat wound dry prior to applying new dressing. Wound #2 Right Gluteus: Clean wound with Normal Saline. Cleanse wound with mild soap and water May Shower, gently pat wound dry prior to applying new dressing. Anesthetic: Wound #1 Left Gluteus: Topical Lidocaine 4% cream applied to wound bed prior to debridement - for clinic use Wound #2 Right Gluteus: Topical Lidocaine 4% cream applied to  wound bed prior to debridement - for clinic use Skin Barriers/Peri-Wound Care: Wound #1 Left Gluteus: Skin Prep Barrier cream Wound #2 Right Gluteus: Skin Prep Barrier cream Primary Wound Dressing: Wound #1 Left Gluteus: Santyl Ointment Wound #2 Right Gluteus: Santyl Ointment Secondary Dressing: Wound #1 Left GluteusNitai [name], [name] ([id number]) Dry Gauze Boardered Foam Dressing Wound #2 Right Gluteus: Dry Gauze Boardered Foam Dressing Dressing Change Frequency: Wound #1 Left Gluteus: Change dressing every day. Wound #2 Right Gluteus: Change dressing every day. Follow-up Appointments: Wound #1 Left Gluteus: Return Appointment in 1 week. Wound #2 Right Gluteus: Return Appointment in 1 week. Off-Loading: Wound #1 Left Gluteus: Turn and reposition every 2 hours Wound #2 Right Gluteus: Turn and reposition every 2 hours Additional Orders / Instructions: Wound #1 Left Gluteus: Stop Smoking Increase protein intake. Wound #2 Right Gluteus: Stop Smoking Increase protein intake. Medications-please add to medication list.: Wound #1 Left Gluteus: Other: - Vitamin A, Vitamin C, Zinc, Multivitamins Wound #2 Right Gluteus: Santyl Enzymatic Ointment Other: - Vitamin A, Vitamin C, Zinc, Multivitamins The following medication(s) was prescribed: Santyl topical 250 unit/gram ointment ointment topical as directed starting 09/29/2015 this 70 year old gentleman who has chronic pain syndrome and has bilateral hip problems which restrict his movements and he spending several hours of the day sitting in a recliner and causing a lot of pressure injury  to his lower sacrum and gluteal area. I have recommended: 1. Offloading and details of this have been discussed with him at great lengths 2. Santyl ointment locally and protecting it with a bordered foam dressing. [name], [name] ([id number]) 3. Adequate protein nutrition and vitamin A, vitamin C, zinc supplements 4. Spent over 3  minutes of time discussing the need to completely give up smoking. he and his wife have had all questions answered and the patient says she's been to be compliant Electronic Signature(s) Signed: 09/29/2015 12:00:57 PM By: Christin Fudge MD, FACS Previous Signature: 09/29/2015 11:05:09 AM Version By: Christin Fudge MD, FACS Entered By: Christin Fudge on 09/29/2015 12:00:57 [name] ([id number]) -------------------------------------------------------------------------------- ROS/PFSH Details Patient Name: [name], Dayten B. Date of Service: 09/29/2015 9:30 AM Medical Record Number: [id number] Patient Account Number: [id number] Date of Birth/Sex: 05-31-1945 (70 y.o. Male) Treating RN: [name] Primary Care Physician: [name] Other Clinician: Referring Physician: Fulton [name] Treating Physician/Extender: [name] in Treatment: 0 Information Obtained From Patient Other: Wound History Do you currently have one or more open woundso Yes How many open wounds do you currently haveo 1 Approximately how long have you had your woundso 4 months How have you been treating your wound(s) until nowo lotrisone, aspercream wiht lidocaine Has your wound(s) ever healed and then re-openedo No Have you had any lab work done in the past montho No Have you tested positive for an antibiotic resistant organism No (MRSA, VRE)o Have you tested positive for osteomyelitis (bone infection)o No Have you had any tests for circulation on your legso Yes [facility] ([facility] Augusto Gamble) [facility] Have you had other problems associated with your woundso Infection, Swelling Integumentary (Skin) Complaints and Symptoms: Positive for: Wounds Psychiatric Complaints and Symptoms: Positive for: Anxiety Constitutional Symptoms (General Health) Complaints and Symptoms: No Complaints or Symptoms Eyes Complaints and Symptoms: No Complaints or  Symptoms Ear/Nose/Mouth/Throat HILMAN, CATAPANO B. ([id number]) Complaints and Symptoms: No Complaints or Symptoms Hematologic/Lymphatic Complaints and Symptoms: No Complaints or Symptoms Respiratory Medical History: Positive for: Chronic Obstructive Pulmonary Disease (COPD) Cardiovascular Complaints and Symptoms: Review of System Notes: aortic stenosis heart murmur hyperlipidemia Medical History: Positive for: Arrhythmia - A-FIB; Congestive Heart Failure; Coronary Artery Disease; Hypertension; Peripheral Venous Disease Negative for: Deep Vein Thrombosis Gastrointestinal Complaints and Symptoms: No Complaints or Symptoms Endocrine Complaints and Symptoms: Review of System Notes: hx of DM pre DM II Genitourinary Complaints and Symptoms: Review of System Notes: problems passing urine Immunological Complaints and Symptoms: No Complaints or Symptoms Musculoskeletal Complaints and Symptoms: Review of System NotesRONON, GESLER. ([id number]) degenerative disc carpal tunnel Medical History: Positive for: Osteoarthritis Neurologic Complaints and Symptoms: No Complaints or Symptoms Oncologic Complaints and Symptoms: No Complaints or Symptoms Family and Social History Cancer: No; Diabetes: Yes - Siblings; Heart Disease: Yes - Siblings, Mother, Father, Paternal Grandparents, Maternal Grandparents; Hereditary Spherocytosis: No; Hypertension: No; Kidney Disease: Yes - Mother; Lung Disease: No; Seizures: Yes - Siblings; Stroke: No; Thyroid Problems: Yes - Siblings; Tuberculosis: No; Current some day smoker; Marital Status - Married; Alcohol Use: Never; Drug Use: No History; Caffeine Use: Daily; Financial Concerns: No; Food, Clothing or Shelter Needs: No; Support System Lacking: No; Transportation Concerns: No; Advanced Directives: No; Patient does not want information on Advanced Directives; Do not resuscitate: No; Living Will: No; Medical Power of Attorney:  No Physician Affirmation I have reviewed and agree with the above information. Electronic Signature(s) Signed: 09/29/2015 10:21:14 AM By: Christin Fudge MD,  FACS Signed: 09/29/2015 4:25:01 PM By: Alric Quan Previous Signature: 09/29/2015 10:19:20 AM Version By: Christin Fudge MD, FACS Entered By: Christin Fudge on 09/29/2015 10:21:13 [name] ([id number]) -------------------------------------------------------------------------------- SuperBill Details Patient Name: [name], Trentan B. Date of Service: 09/29/2015 Medical Record Number: [id number] Patient Account Number: [id number] Date of Birth/Sex: [birth year] (70 y.o. 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 dependence, cigarettes, with other nicotine-induced disorders G89.4 Chronic pain syndrome Facility Procedures CPT4 Code: [id number] Description: 99214 - WOUND CARE VISIT-LEV 4 EST PT Modifier: Quantity: 1 Physician Procedures CPT4 Code Description: [id number] - WC PHYS LEVEL 4 - NEW PT ICD-10 Description Diagnosis L89.153 Pressure ulcer of sacral region, stage 3 F17.218 Nicotine dependence, cigarettes, with other nicotine G89.4 Chronic pain syndrome Modifier: -induced dis Quantity: 1 orders CPT4 Code Description: [id number]- SMOKING CESSATION 3-10 MINS ICD-10 Description Diagnosis L89.153 Pressure ulcer of sacral region, stage 3 F17.218 Nicotine dependence, cigarettes, with other nicotine G89.4 Chronic pain syndrome Modifier: -induced dis Quantity: 1 orders Electronic Signature(s) Signed: 09/29/2015 4:01:06 PM By: Christin Fudge MD, FACS Signed: 09/29/2015 4:25:01 PM By: Alric Quan Previous Signature: 09/29/2015 11:05:41 AM Version By: Christin Fudge MD,  FACS Entered By: Alric Quan on 09/29/2015 11:19:24

## 2015-09-29 NOTE — Progress Notes (Signed)
[facility] ([id number]) Visit Report for 09/29/2015 Allergy List Details Patient Name: COWICK, Massie B. Date of Service: 09/29/2015 9:30 AM Medical Record Number: [id number] Patient Account Number: [id number] Date of Birth/Sex: 1945-10-19 (70 y.o. Male) Treating RN: Ahmed Prima Primary Care Physician: Fulton Reek Other Clinician: Referring Physician: Fulton Reek Treating Physician/Extender: Frann Rider in Treatment: 0 Allergies Active Allergies Celebrex Reaction: chest pain Severity: Severe penicillin Reaction: unknown (childhood) Severity: Severe Allergy Notes Electronic Signature(s) Signed: 09/29/2015 4:25:01 PM By: Alric Quan Entered By: Alric Quan on 09/29/2015 09:34:55 [name] ([id number]) -------------------------------------------------------------------------------- Arrival Information Details Patient Name: [name], Heriberto B. Date of Service: 09/29/2015 9:30 AM Medical Record Number: [id number] Patient Account Number: [id number] Date of Birth/Sex: 1945/05/05 (70 y.o. Male) Treating RN: Ahmed Prima Primary Care Physician: Fulton Reek Other Clinician: Referring Physician: Fulton Reek Treating Physician/Extender: Frann Rider in Treatment: 0 Visit Information Patient Arrived: Ambulatory Arrival Time: 09:28 Accompanied By: wife Transfer Assistance: None Patient Identification Verified: Yes Secondary Verification Process Yes Completed: Patient Requires Transmission-Based No Precautions: Patient Has Alerts: No Electronic Signature(s) Signed: 09/29/2015 4:25:01 PM By: Alric Quan Entered By: Alric Quan on 09/29/2015 09:31:55 [name] ([id number]) -------------------------------------------------------------------------------- Clinic Level of Care Assessment Details Patient Name: [name], Trinten B. Date of Service: 09/29/2015 9:30 AM Medical Record Number: [id number] Patient  Account Number: [id number] Date of Birth/Sex: Sep 13, 1945 (70 y.o. Male) Treating RN: Ahmed Prima Primary Care Physician: Fulton Reek Other Clinician: Referring Physician: Fulton Reek Treating Physician/Extender: Frann Rider in Treatment: 0 Clinic Level of Care Assessment Items TOOL 2 Quantity Score X - Use when only an EandM is performed on the INITIAL visit 1 0 ASSESSMENTS - Nursing Assessment / Reassessment X - General Physical Exam (combine w/ comprehensive assessment (listed just 1 20 below) when performed on new pt. evals) X - Comprehensive Assessment (HX, ROS, Risk Assessments, Wounds Hx, etc.) 1 25 ASSESSMENTS - Wound and Skin Assessment / Reassessment []  - Simple Wound Assessment / Reassessment - one wound 0 X - Complex Wound Assessment / Reassessment - multiple wounds 2 5 []  - Dermatologic / Skin Assessment (not related to wound area) 0 ASSESSMENTS - Ostomy and/or Continence Assessment and Care []  - Incontinence Assessment and Management 0 []  - Ostomy Care Assessment and Management (repouching, etc.) 0 PROCESS - Coordination of Care []  - Simple Patient / Family Education for ongoing care 0 X - Complex (extensive) Patient / Family Education for ongoing care 1 20 []  - Staff obtains Programmer, systems, Records, Test Results / Process Orders 0 []  - Staff telephones HHA, Nursing Homes / Clarify orders / etc 0 []  - Routine Transfer to another Facility (non-emergent condition) 0 []  - Routine Hospital Admission (non-emergent condition) 0 X - New Admissions / Biomedical engineer / Ordering NPWT, Apligraf, etc. 1 15 []  - Emergency Hospital Admission (emergent condition) 0 X - Simple Discharge Coordination 1 10 Cornacchia, Jsoeph B. ([id number]) []  - Complex (extensive) Discharge Coordination 0 PROCESS - Special Needs []  - Pediatric / Minor Patient Management 0 []  - Isolation Patient Management 0 []  - Hearing / Language / Visual special needs 0 []  - Assessment of  Community assistance (transportation, D/C planning, etc.) 0 []  - Additional assistance / Altered mentation 0 []  - Support Surface(s) Assessment (bed, cushion, seat, etc.) 0 INTERVENTIONS - Wound Cleansing / Measurement X - Wound Imaging (photographs - any number of wounds) 1 5 []  - Wound Tracing (instead of photographs) 0 []  - Simple Wound Measurement - one wound 0 X - Complex Wound  Measurement - multiple wounds 2 5 []  - Simple Wound Cleansing - one wound 0 X - Complex Wound Cleansing - multiple wounds 2 5 INTERVENTIONS - Wound Dressings []  - Small Wound Dressing one or multiple wounds 0 X - Medium Wound Dressing one or multiple wounds 1 15 []  - Large Wound Dressing one or multiple wounds 0 []  - Application of Medications - injection 0 INTERVENTIONS - Miscellaneous []  - External ear exam 0 []  - Specimen Collection (cultures, biopsies, blood, body fluids, etc.) 0 []  - Specimen(s) / Culture(s) sent or taken to Lab for analysis 0 []  - Patient Transfer (multiple staff / Harrel Lemon Lift / Similar devices) 0 []  - Simple Staple / Suture removal (25 or less) 0 []  - Complex Staple / Suture removal (26 or more) 0 Kysar, Garvey B. ([id number]) []  - Hypo / Hyperglycemic Management (close monitor of Blood Glucose) 0 []  - Ankle / Brachial Index (ABI) - do not check if billed separately 0 Has the patient been seen at the hospital within the last three years: Yes Total Score: 140 Level Of Care: New/Established - Level 4 Electronic Signature(s) Signed: 09/29/2015 4:25:01 PM By: Alric Quan Entered By: Alric Quan on 09/29/2015 11:19:14 [name] ([id number]) -------------------------------------------------------------------------------- Encounter Discharge Information Details Patient Name: [name], Clarnce B. Date of Service: 09/29/2015 9:30 AM Medical Record Number: [id number] Patient Account Number: [id number] Date of Birth/Sex: 12-18-1945 (70 y.o. Male) Treating RN: Ahmed Prima Primary Care Physician: Fulton Reek Other Clinician: Referring Physician: Fulton Reek Treating Physician/Extender: Frann Rider in Treatment: 0 Encounter Discharge Information Items Discharge Pain Level: 0 Discharge Condition: Stable Ambulatory Status: Ambulatory Discharge Destination: Home Private Transportation: Auto Accompanied By: wife Schedule Follow-up Appointment: Yes Medication Reconciliation completed and Yes provided to Patient/Care Giavana Rooke: Clinical Summary of Care: Electronic Signature(s) Signed: 09/29/2015 4:25:01 PM By: Alric Quan Entered By: Alric Quan on 09/29/2015 10:13:23 [name] ([id number]) -------------------------------------------------------------------------------- Lower Extremity Assessment Details Patient Name: [name], Daiwik B. Date of Service: 09/29/2015 9:30 AM Medical Record Number: [id number] Patient Account Number: [id number] Date of Birth/Sex: 01/02/1946 (70 y.o. Male) Treating RN: Ahmed Prima Primary Care Physician: Fulton Reek Other Clinician: Referring Physician: Fulton Reek Treating Physician/Extender: Frann Rider in Treatment: 0 Electronic Signature(s) Signed: 09/29/2015 4:25:01 PM By: Alric Quan Entered By: Alric Quan on 09/29/2015 09:33:41 [name] ([id number]) -------------------------------------------------------------------------------- Multi Wound Chart Details Patient Name: [name], Quavion B. Date of Service: 09/29/2015 9:30 AM Medical Record Number: [id number] Patient Account Number: [id number] Date of Birth/Sex: Jul 07, 1945 (70 y.o. Male) Treating RN: Ahmed Prima Primary Care Physician: Fulton Reek Other Clinician: Referring Physician: Fulton Reek Treating Physician/Extender: Frann Rider in Treatment: 0 Vital Signs Height(in): 68 Pulse(bpm): 77 Weight(lbs): 175 Blood Pressure 115/71 (mmHg): Body Mass Index(BMI):  27 Temperature(F): 98.6 Respiratory Rate 20 (breaths/min): Photos: [1:No Photos] [2:No Photos] [N/A:N/A] Wound Location: [1:Left Gluteus] [2:Right Gluteus] [N/A:N/A] Wounding Event: [1:Gradually Appeared] [2:Gradually Appeared] [N/A:N/A] Primary Etiology: [1:Pressure Ulcer] [2:Pressure Ulcer] [N/A:N/A] Comorbid History: [1:Chronic Obstructive Pulmonary Disease (COPD), Arrhythmia, Congestive Heart Failure, Coronary Artery Disease, Hypertension, Peripheral Venous Disease, Osteoarthritis] [2:Chronic Obstructive Pulmonary Disease (COPD), Arrhythmia,  Congestive Heart Failure, Coronary Artery Disease, Hypertension, Peripheral Venous Disease, Osteoarthritis] [N/A:N/A] Date Acquired: [1:06/01/2015] [2:06/01/2015] [N/A:N/A] Weeks of Treatment: [1:0] [2:0] [N/A:N/A] Wound Status: [1:Open] [2:Open] [N/A:N/A] Measurements L x W x D 7x1.5x0.1 [2:6x3.3x0.1] [N/A:N/A] (cm) Area (cm) : [1:8.247] [2:15.551] [N/A:N/A] Volume (cm) : [1:0.825] [2:1.555] [N/A:N/A] % Reduction in Area: [1:-180.00%] [2:N/A] [N/A:N/A] % Reduction in Volume: -179.70% [2:N/A] [N/A:N/A] Classification: [1:Category/Stage II] [2:Category/Stage II] [N/A:N/A]  Exudate Amount: [1:Large] [2:Large] [N/A:N/A] Exudate Type: [1:Serous] [2:Serous] [N/A:N/A] Exudate Color: [1:amber] [2:amber] [N/A:N/A] Wound Margin: [1:Flat and Intact] [2:Flat and Intact] [N/A:N/A] Granulation Amount: [1:Small (1-33%)] [2:Small (1-33%)] [N/A:N/A] Necrotic Amount: [1:Large (67-100%)] [2:N/A] [N/A:N/A] Necrotic Tissue: [1:Eschar] [2:N/A] [N/A:N/A] Exposed Structures: [N/A:N/A] Fascia: No Fascia: No Fat: No Fat: No Tendon: No Tendon: No Muscle: No Muscle: No Joint: No Joint: No Bone: No Bone: No Limited to Skin Limited to Skin Breakdown Breakdown Epithelialization: None None N/A Periwound Skin Texture: Edema: Yes Edema: Yes N/A Periwound Skin Moist: Yes Moist: Yes N/A Moisture: Periwound Skin Color: Erythema: Yes Erythema: Yes N/A Erythema  Location: Circumferential Circumferential N/A Temperature: No Abnormality No Abnormality N/A Tenderness on Yes Yes N/A Palpation: Wound Preparation: Ulcer Cleansing: Ulcer Cleansing: N/A Rinsed/Irrigated with Rinsed/Irrigated with Saline Saline Topical Anesthetic Topical Anesthetic Applied: Other: lidocaine Applied: Other: lidocaine 4% 4% Treatment Notes Electronic Signature(s) Signed: 09/29/2015 4:25:01 PM By: Alric Quan Entered By: Alric Quan on 09/29/2015 10:25:07 [name] ([id number]) -------------------------------------------------------------------------------- [place] Details Patient Name: [name], Durwood B. Date of Service: 09/29/2015 9:30 AM Medical Record Number: [id number] Patient Account Number: [id number] Date of Birth/Sex: Jun 25, 1945 (70 y.o. Male) Treating RN: Ahmed Prima Primary Care Physician: Fulton Reek Other Clinician: Referring Physician: Fulton Reek Treating Physician/Extender: Frann Rider in Treatment: 0 Active Inactive Orientation to the Wound Care Program Nursing Diagnoses: Knowledge deficit related to the wound healing center program Goals: Patient/caregiver will verbalize understanding of the [facility] Program Date Initiated: 09/29/2015 Goal Status: Active Interventions: Provide education on orientation to the wound center Notes: Pain, Acute or Chronic Nursing Diagnoses: Pain, acute or chronic: actual or potential Potential alteration in comfort, pain Goals: Patient will verbalize adequate pain control and receive pain control interventions during procedures as needed Date Initiated: 09/29/2015 Goal Status: Active Interventions: Assess comfort goal upon admission Complete pain assessment as per visit requirements Notes: Pressure Nursing Diagnoses: Knowledge deficit related to causes and risk factors for pressure ulcer development Knowledge deficit related to management  of pressures ulcers Birchard, Rylan B. ([id number]) Goals: Patient will remain free from development of additional pressure ulcers Date Initiated: 09/29/2015 Goal Status: Active Interventions: Assess offloading mechanisms upon admission and as needed Assess potential for pressure ulcer upon admission and as needed Notes: Wound/Skin Impairment Nursing Diagnoses: Impaired tissue integrity Knowledge deficit related to smoking impact on wound healing Goals: Patient will demonstrate a reduced rate of smoking or cessation of smoking Date Initiated: 09/29/2015 Goal Status: Active Ulcer/skin breakdown will have a volume reduction of 30% by week 4 Date Initiated: 09/29/2015 Goal Status: Active Ulcer/skin breakdown will have a volume reduction of 50% by week 8 Date Initiated: 09/29/2015 Goal Status: Active Ulcer/skin breakdown will have a volume reduction of 80% by week 12 Date Initiated: 09/29/2015 Goal Status: Active Interventions: Assess patient/caregiver ability to obtain necessary supplies Assess ulceration(s) every visit Provide education on smoking Notes: Electronic Signature(s) Signed: 09/29/2015 4:25:01 PM By: Alric Quan Entered By: Alric Quan on 09/29/2015 10:13:08 [name] ([id number]) -------------------------------------------------------------------------------- Pain Assessment Details Patient Name: [name], Danile B. Date of Service: 09/29/2015 9:30 AM Medical Record Number: [id number] Patient Account Number: [id number] Date of Birth/Sex: 1945/05/25 (70 y.o. Male) Treating RN: Ahmed Prima Primary Care Physician: Fulton Reek Other Clinician: Referring Physician: Fulton Reek Treating Physician/Extender: Frann Rider in Treatment: 0 Active Problems Location of Pain Severity and Description of Pain Patient Has Paino No Site Locations With Dressing Change: Yes Duration of the Pain. Constant / Intermittento Constant Rate the  pain.  Current Pain Level: 10 Worst Pain Level: 10 Least Pain Level: 10 Character of Pain Describe the Pain: Stabbing, Throbbing Pain Management and Medication Current Pain Management: Electronic Signature(s) Signed: 09/29/2015 4:25:01 PM By: Alric Quan Entered By: Alric Quan on 09/29/2015 09:32:51 [name] ([id number]) -------------------------------------------------------------------------------- Patient/Caregiver Education Details Patient Name: [name], Keedan B. Date of Service: 09/29/2015 9:30 AM Medical Record Number: [id number] Patient Account Number: [id number] Date of Birth/Gender: 11-10-1945 (70 y.o. Male) Treating RN: Ahmed Prima Primary Care Physician: Fulton Reek Other Clinician: Referring Physician: Fulton Reek Treating Physician/Extender: Frann Rider in Treatment: 0 Education Assessment Education Provided To: Patient Education Topics Provided Wound/Skin Impairment: Handouts: Other: change dressing as ordered Methods: Demonstration, Explain/Verbal Responses: State content correctly Electronic Signature(s) Signed: 09/29/2015 4:25:01 PM By: Alric Quan Entered By: Alric Quan on 09/29/2015 10:13:34 [name] ([id number]) -------------------------------------------------------------------------------- Wound Assessment Details Patient Name: [name], Thunder B. Date of Service: 09/29/2015 9:30 AM Medical Record Number: [id number] Patient Account Number: [id number] Date of Birth/Sex: [birth year] (70 y.o. Male) Treating RN: Ahmed Prima Primary Care Physician: Fulton Reek Other Clinician: Referring Physician: Fulton Reek Treating Physician/Extender: Frann Rider in Treatment: 0 Wound Status Wound Number: 1 Primary Pressure Ulcer Etiology: Wound Location: Left Gluteus Wound Open Wounding Event: Gradually Appeared Status: Date Acquired: 06/01/2015 Comorbid Chronic Obstructive Pulmonary  Disease Weeks Of Treatment: 0 History: (COPD), Arrhythmia, Congestive Heart Clustered Wound: No Failure, Coronary Artery Disease, Hypertension, Peripheral Venous Disease, Osteoarthritis Photos Photo Uploaded By: Alric Quan on 09/29/2015 15:49:29 Wound Measurements Length: (cm) 7 Width: (cm) 1.5 Depth: (cm) 0.1 Area: (cm) 8.247 Volume: (cm) 0.825 % Reduction in Area: -180% % Reduction in Volume: -179.7% Epithelialization: None Tunneling: No Undermining: No Wound Description Classification: Category/Stage II Wound Margin: Flat and Intact Exudate Amount: Large Exudate Type: Serous Exudate Color: amber Foul Odor After Cleansing: No Wound Bed Granulation Amount: Small (1-33%) Exposed Structure Necrotic Amount: Large (67-100%) Fascia Exposed: No Nesby, Shamond B. ([id number]) Necrotic Quality: Eschar Fat Layer Exposed: No Tendon Exposed: No Muscle Exposed: No Joint Exposed: No Bone Exposed: No Limited to Skin Breakdown Periwound Skin Texture Texture Color No Abnormalities Noted: No No Abnormalities Noted: No Localized Edema: Yes Erythema: Yes Erythema Location: Circumferential Moisture No Abnormalities Noted: No Temperature / Pain Moist: Yes Temperature: No Abnormality Tenderness on Palpation: Yes Wound Preparation Ulcer Cleansing: Rinsed/Irrigated with Saline Topical Anesthetic Applied: Other: lidocaine 4%, Treatment Notes Wound #1 (Left Gluteus) 1. Cleansed with: Clean wound with Normal Saline 2. Anesthetic Topical Lidocaine 4% cream to wound bed prior to debridement 3. Peri-wound Care: Barrier cream Skin Prep 4. Dressing Applied: Santyl Ointment 5. Secondary Dressing Applied Bordered Foam Dressing Dry Gauze Electronic Signature(s) Signed: 09/29/2015 4:25:01 PM By: Alric Quan Entered By: Alric Quan on 09/29/2015 10:08:42 [name] -------------------------------------------------------------------------------- Wound Assessment Details Patient Name: [name], Gaylin B. Date of Service: 09/29/2015 9:30 AM Medical Record Number: [id number] Patient Account Number: [id number] Date of Birth/Sex: 1945-06-17 (70 y.o. Male) Treating RN: Ahmed Prima Primary Care Physician: Fulton Reek Other Clinician: Referring Physician: Fulton Reek Treating Physician/Extender: Frann Rider in Treatment: 0 Wound Status Wound Number: 2 Primary Pressure Ulcer Etiology: Wound Location: Right Gluteus Wound Open Wounding Event: Gradually Appeared Status: Date Acquired: 06/01/2015 Comorbid Chronic Obstructive Pulmonary Disease Weeks Of Treatment: 0 History: (COPD), Arrhythmia, Congestive Heart Clustered Wound: No Failure, Coronary Artery Disease, Hypertension, Peripheral Venous Disease, Osteoarthritis Photos Photo Uploaded By: Alric Quan on 09/29/2015 15:49:29 Wound Measurements Length: (cm) 6 Width: (cm) 3.3 Depth: (cm) 0.1 Area: (cm) 15.551 Volume: (  cm) 1.555 % Reduction in Area: % Reduction in Volume: Epithelialization: None Tunneling: No Undermining: No Wound Description Classification: Category/Stage II Wound Margin: Flat and Intact Exudate Amount: Large Exudate Type: Serous Exudate Color: amber Foul Odor After Cleansing: No Wound Bed Granulation Amount: Small (1-33%) Exposed Structure Fascia Exposed: No Decarlo, Asah B. ([id number]) Fat Layer Exposed: No Tendon Exposed: No Muscle Exposed: No Joint Exposed: No Bone Exposed: No Limited to Skin Breakdown Periwound Skin Texture Texture Color No Abnormalities Noted: No No Abnormalities Noted: No Localized Edema: Yes Erythema: Yes Erythema Location: Circumferential Moisture No Abnormalities Noted: No Temperature / Pain Moist: Yes Temperature: No Abnormality Tenderness on Palpation: Yes Wound Preparation Ulcer  Cleansing: Rinsed/Irrigated with Saline Topical Anesthetic Applied: Other: lidocaine 4%, Treatment Notes Wound #2 (Right Gluteus) 1. Cleansed with: Clean wound with Normal Saline 2. Anesthetic Topical Lidocaine 4% cream to wound bed prior to debridement 3. Peri-wound Care: Barrier cream Skin Prep 4. Dressing Applied: Santyl Ointment 5. Secondary Dressing Applied Bordered Foam Dressing Dry Gauze Electronic Signature(s) Signed: 09/29/2015 4:25:01 PM By: Alric Quan Entered By: Alric Quan on 09/29/2015 10:08:29 [name] ([id number]) -------------------------------------------------------------------------------- Vitals Details Patient Name: [name], Franke B. Date of Service: 09/29/2015 9:30 AM Medical Record Number: [id number] Patient Account Number: [id number] Date of Birth/Sex: June 04, 1945 (70 y.o. Male) Treating RN: Carolyne Fiscal, Debi Primary Care Physician: Fulton Reek Other Clinician: Referring Physician: Fulton Reek Treating Physician/Extender: Frann Rider in Treatment: 0 Vital Signs Time Taken: 09:32 Temperature (F): 98.6 Height (in): 68 Pulse (bpm): 77 Source: Stated Respiratory Rate (breaths/min): 20 Weight (lbs): 175 Blood Pressure (mmHg): 115/71 Source: Stated Reference Range: 80 - 120 mg / dl Body Mass Index (BMI): 26.6 Electronic Signature(s) Signed: 09/29/2015 4:25:01 PM By: Alric Quan Entered By: Alric Quan on 09/29/2015 09:33:33

## 2015-09-29 NOTE — Progress Notes (Signed)
KAIO, PIROLLI ([id number]) Visit Report for 09/29/2015 Abuse/Suicide Risk Screen Details Patient Name: [name], [name] Date of Service: 09/29/2015 9:30 AM Medical Record Number: [id number] Patient Account Number: [id number] Date of Birth/Sex: 1945/09/09 (70 y.o. Male) Treating RN: Ahmed Prima Primary Care Physician: Fulton Reek Other Clinician: Referring Physician: Fulton Reek Treating Physician/Extender: Frann Rider in Treatment: 0 Abuse/Suicide Risk Screen Items Answer ABUSE/SUICIDE RISK SCREEN: Has anyone close to you tried to hurt or harm you recentlyo No Do you feel uncomfortable with anyone in your familyo No Has anyone forced you do things that you didnot want to doo No Do you have any thoughts of harming yourselfo No Patient displays signs or symptoms of abuse and/or neglect. No Electronic Signature(s) Signed: 09/29/2015 4:25:01 PM By: Alric Quan Entered By: Alric Quan on 09/29/2015 09:47:33 [name] ([id number]) -------------------------------------------------------------------------------- Activities of Daily Living Details Patient Name: [name], [name] Date of Service: 09/29/2015 9:30 AM Medical Record Number: [id number] Patient Account Number: [id number] Date of Birth/Sex: 1945/08/05 (70 y.o. Male) Treating RN: Ahmed Prima Primary Care Physician: Fulton Reek Other Clinician: Referring Physician: Fulton Reek Treating Physician/Extender: Frann Rider in Treatment: 0 Activities of Daily Living Items Answer Activities of Daily Living (Please select one for each item) Drive Automobile Completely Able Take Medications Completely Able Use Telephone Completely Able Care for Appearance Completely Able Use Toilet Completely Able Bath / Shower Completely Able Dress Self Completely Able Feed Self Completely Able Walk Completely Able Get In / Out Bed Completely Able Housework Completely Able Prepare  Meals Completely Able Handle Money Completely Able Shop for Self Completely Able Electronic Signature(s) Signed: 09/29/2015 4:25:01 PM By: Alric Quan Entered By: Alric Quan on 09/29/2015 09:47:56 [name] ([id number]) -------------------------------------------------------------------------------- Education Assessment Details Patient Name: [name], [name] Date of Service: 09/29/2015 9:30 AM Medical Record Number: [id number] Patient Account Number: [id number] Date of Birth/Sex: 10/16/1945 (70 y.o. Male) Treating RN: Ahmed Prima Primary Care Physician: Fulton Reek Other Clinician: Referring Physician: Fulton Reek Treating Physician/Extender: Frann Rider in Treatment: 0 Learning Preferences/Education Level/Primary Language Learning Preference: Explanation, Printed Material Highest Education Level: High School Preferred Language: English Cognitive Barrier Assessment/Beliefs Language Barrier: No Translator Needed: No Memory Deficit: No Emotional Barrier: No Cultural/Religious Beliefs Affecting Medical No Care: Physical Barrier Assessment Impaired Vision: No Impaired Hearing: No Decreased Hand dexterity: No Knowledge/Comprehension Assessment Knowledge Level: High Comprehension Level: High Ability to understand written High instructions: Ability to understand verbal High instructions: Motivation Assessment Anxiety Level: Calm Cooperation: Cooperative Education Importance: Acknowledges Need Interest in Health Problems: Asks Questions Perception: Coherent Willingness to Engage in Self- High Management Activities: Readiness to Engage in Self- High Management Activities: Electronic Signature(s) Signed: 09/29/2015 4:25:01 PM By: Ronnie Derby, [name] ([id number]) Entered By: Alric Quan on 09/29/2015 09:48:19 [name] -------------------------------------------------------------------------------- Fall Risk Assessment Details Patient Name: [name], [name] Date of Service: 09/29/2015 9:30 AM Medical Record Number: [id number] Patient Account Number: [id number] Date of Birth/Sex: Jun 21, 1945 (70 y.o. Male) Treating RN: Ahmed Prima Primary Care Physician: Fulton Reek Other Clinician: Referring Physician: Fulton Reek Treating Physician/Extender: Frann Rider in Treatment: 0 Fall Risk Assessment Items Have you had 2 or more falls in the last 12 monthso 0 Yes Have you had any fall that resulted in injury in the last 12 monthso 0 No FALL RISK ASSESSMENT: History of falling - immediate or within 3 months 0 No Secondary diagnosis 0 No Ambulatory aid None/bed rest/wheelchair/nurse 0 No Crutches/cane/walker 0 No Furniture 0 No IV Access/Saline  Lock 0 No Gait/Training Normal/bed rest/immobile 0 No Weak 0 No Impaired 0 No Mental Status Oriented to own ability 0 Yes Electronic Signature(s) Signed: 09/29/2015 4:25:01 PM By: Alric Quan Entered By: Alric Quan on 09/29/2015 09:49:36 [name] ([id number]) -------------------------------------------------------------------------------- Nutrition Risk Assessment Details Patient Name: [name], [name] Date of Service: 09/29/2015 9:30 AM Medical Record Number: [id number] Patient Account Number: [id number] Date of Birth/Sex: 06/05/1945 (70 y.o. Male) Treating RN: Ahmed Prima Primary Care Physician: Fulton Reek Other Clinician: Referring Physician: Fulton Reek Treating Physician/Extender: Frann Rider in Treatment: 0 Height (in): 68 Weight (lbs): 175 Body Mass Index (BMI): 26.6 Nutrition Risk Assessment Items NUTRITION RISK SCREEN: I have an illness or condition that made me change the kind and/or 0 No amount of food I eat I eat fewer than two meals per day 0 No I eat few fruits and  vegetables, or milk products 0 No I have three or more drinks of beer, liquor or wine almost every day 0 No I have tooth or mouth problems that make it hard for me to eat 0 No I don't always have enough money to buy the food I need 0 No I eat alone most of the time 0 No I take three or more different prescribed or over-the-counter drugs a 1 Yes day Without wanting to, I have lost or gained 10 pounds in the last six 0 No months I am not always physically able to shop, cook and/or feed myself 0 No Nutrition Protocols Good Risk Protocol Moderate Risk Protocol Electronic Signature(s) Signed: 09/29/2015 4:25:01 PM By: Alric Quan Entered By: Alric Quan on 09/29/2015 09:49:51

## 2015-10-06 ENCOUNTER — Encounter: Payer: Medicare Other | Admitting: Surgery

## 2015-10-06 DIAGNOSIS — L89153 Pressure ulcer of sacral region, stage 3: Secondary | ICD-10-CM | POA: Diagnosis not present

## 2015-10-07 NOTE — Progress Notes (Signed)
TRIGG, BANKOWSKI ([id number]) Visit Report for 10/06/2015 Arrival Information Details Patient Name: DEMIAN, Sun B. Date of Service: 10/06/2015 11:00 AM Medical Record Number: [id number] Patient Account Number: [id number] Date of Birth/Sex: 03/12/1946 (70 y.o. Male) Treating RN: Ahmed Prima Primary Care Physician: Fulton Reek Other Clinician: Referring Physician: Fulton Reek Treating Physician/Extender: Frann Rider in Treatment: 1 Visit Information History Since Last Visit All ordered tests and consults were completed: No Patient Arrived: Ambulatory Added or deleted any medications: No Arrival Time: 11:32 Any new allergies or adverse reactions: No Accompanied By: wife Had a fall or experienced change in No Transfer Assistance: None activities of daily living that may affect Patient Identification Verified: Yes risk of falls: Secondary Verification Process Yes Signs or symptoms of abuse/neglect since last No Completed: visito Patient Requires Transmission-Based No Hospitalized since last visit: No Precautions: Pain Present Now: Yes Patient Has Alerts: No Electronic Signature(s) Signed: 10/06/2015 5:25:44 PM By: Alric Quan Entered By: Alric Quan on 10/06/2015 11:32:35 [name] ([id number]) -------------------------------------------------------------------------------- Clinic Level of Care Assessment Details Patient Name: [name], Tobie B. Date of Service: 10/06/2015 11:00 AM Medical Record Number: [id number] Patient Account Number: [id number] Date of Birth/Sex: November 30, 1945 (70 y.o. Male) Treating RN: Ahmed Prima Primary Care Physician: Fulton Reek Other Clinician: Referring Physician: Fulton Reek Treating Physician/Extender: Frann Rider in Treatment: 1 Clinic Level of Care Assessment Items TOOL 4 Quantity Score X - Use when only an EandM is performed on FOLLOW-UP visit 1 0 ASSESSMENTS - Nursing  Assessment / Reassessment []  - Reassessment of Co-morbidities (includes updates in patient status) 0 []  - Reassessment of Adherence to Treatment Plan 0 ASSESSMENTS - Wound and Skin Assessment / Reassessment []  - Simple Wound Assessment / Reassessment - one wound 0 X - Complex Wound Assessment / Reassessment - multiple wounds 2 5 []  - Dermatologic / Skin Assessment (not related to wound area) 0 ASSESSMENTS - Focused Assessment []  - Circumferential Edema Measurements - multi extremities 0 []  - Nutritional Assessment / Counseling / Intervention 0 []  - Lower Extremity Assessment (monofilament, tuning fork, pulses) 0 []  - Peripheral Arterial Disease Assessment (using hand held doppler) 0 ASSESSMENTS - Ostomy and/or Continence Assessment and Care []  - Incontinence Assessment and Management 0 []  - Ostomy Care Assessment and Management (repouching, etc.) 0 PROCESS - Coordination of Care X - Simple Patient / Family Education for ongoing care 1 15 []  - Complex (extensive) Patient / Family Education for ongoing care 0 X - Staff obtains Programmer, systems, Records, Test Results / Process Orders 1 10 []  - Staff telephones HHA, Nursing Homes / Clarify orders / etc 0 []  - Routine Transfer to another Facility (non-emergent condition) 0 [place], [place] ([id number]) []  - Routine Hospital Admission (non-emergent condition) 0 []  - New Admissions / Insurance Authorizations / Ordering NPWT, Apligraf, etc. 0 []  - Emergency Hospital Admission (emergent condition) 0 X - Simple Discharge Coordination 1 10 []  - Complex (extensive) Discharge Coordination 0 PROCESS - Special Needs []  - Pediatric / Minor Patient Management 0 []  - Isolation Patient Management 0 []  - Hearing / Language / Visual special needs 0 []  - Assessment of Community assistance (transportation, D/C planning, etc.) 0 []  - Additional assistance / Altered mentation 0 []  - Support Surface(s) Assessment (bed, cushion, seat, etc.) 0 INTERVENTIONS - Wound  Cleansing / Measurement []  - Simple Wound Cleansing - one wound 0 X - Complex Wound Cleansing - multiple wounds 2 5 X - Wound Imaging (photographs - any number of wounds) 1 5 []  -  Wound Tracing (instead of photographs) 0 []  - Simple Wound Measurement - one wound 0 X - Complex Wound Measurement - multiple wounds 2 5 INTERVENTIONS - Wound Dressings []  - Small Wound Dressing one or multiple wounds 0 X - Medium Wound Dressing one or multiple wounds 1 15 []  - Large Wound Dressing one or multiple wounds 0 X - Application of Medications - topical 1 5 []  - Application of Medications - injection 0 INTERVENTIONS - Miscellaneous []  - External ear exam 0 Sessa, Llewellyn B. ([id number]) []  - Specimen Collection (cultures, biopsies, blood, body fluids, etc.) 0 []  - Specimen(s) / Culture(s) sent or taken to Lab for analysis 0 []  - Patient Transfer (multiple staff / Harrel Lemon Lift / Similar devices) 0 []  - Simple Staple / Suture removal (25 or less) 0 []  - Complex Staple / Suture removal (26 or more) 0 []  - Hypo / Hyperglycemic Management (close monitor of Blood Glucose) 0 []  - Ankle / Brachial Index (ABI) - do not check if billed separately 0 X - Vital Signs 1 5 Has the patient been seen at the hospital within the last three years: Yes Total Score: 95 Level Of Care: New/Established - Level 3 Electronic Signature(s) Signed: 10/06/2015 5:25:44 PM By: Alric Quan Entered By: Alric Quan on 10/06/2015 16:40:59 [name] ([id number]) -------------------------------------------------------------------------------- Encounter Discharge Information Details Patient Name: [name], Noah B. Date of Service: 10/06/2015 11:00 AM Medical Record Number: [id number] Patient Account Number: [id number] Date of Birth/Sex: Jul 16, 1945 (70 y.o. Male) Treating RN: Ahmed Prima Primary Care Physician: Fulton Reek Other Clinician: Referring Physician: Fulton Reek Treating Physician/Extender:  Frann Rider in Treatment: 1 Encounter Discharge Information Items Discharge Pain Level: 0 Discharge Condition: Stable Ambulatory Status: Ambulatory Discharge Destination: Home Transportation: Private Auto Accompanied By: self Schedule Follow-up Appointment: Yes Medication Reconciliation completed and provided to Patient/Care Yes Nattaly Yebra: Provided on Clinical Summary of Care: 10/06/2015 Form Type Recipient Paper Patient PB Electronic Signature(s) Signed: 10/06/2015 12:04:02 PM By: Ruthine Dose Entered By: Ruthine Dose on 10/06/2015 12:04:02 [name] ([id number]) -------------------------------------------------------------------------------- Lower Extremity Assessment Details Patient Name: [name], Campbell B. Date of Service: 10/06/2015 11:00 AM Medical Record Number: [id number] Patient Account Number: [id number] Date of Birth/Sex: 11-Oct-1945 (70 y.o. Male) Treating RN: Ahmed Prima Primary Care Physician: Fulton Reek Other Clinician: Referring Physician: Fulton Reek Treating Physician/Extender: Frann Rider in Treatment: 1 Electronic Signature(s) Signed: 10/06/2015 5:25:44 PM By: Alric Quan Entered By: Alric Quan on 10/06/2015 11:34:44 [name] ([id number]) -------------------------------------------------------------------------------- Multi Wound Chart Details Patient Name: [name], Tycen B. Date of Service: 10/06/2015 11:00 AM Medical Record Number: [id number] Patient Account Number: [id number] Date of Birth/Sex: 1946-03-24 (70 y.o. Male) Treating RN: Ahmed Prima Primary Care Physician: Fulton Reek Other Clinician: Referring Physician: Fulton Reek Treating Physician/Extender: Frann Rider in Treatment: 1 Vital Signs Height(in): 68 Pulse(bpm): 65 Weight(lbs): 175 Blood Pressure 120/53 (mmHg): Body Mass Index(BMI): 27 Temperature(F): 98.7 Respiratory  Rate 20 (breaths/min): Photos: [1:No Photos] [2:No Photos] [N/A:N/A] Wound Location: [1:Left Gluteus] [2:Right Gluteus] [N/A:N/A] Wounding Event: [1:Gradually Appeared] [2:Gradually Appeared] [N/A:N/A] Primary Etiology: [1:Pressure Ulcer] [2:Pressure Ulcer] [N/A:N/A] Comorbid History: [1:Chronic Obstructive Pulmonary Disease (COPD), Arrhythmia, Congestive Heart Failure, Coronary Artery Disease, Hypertension, Peripheral Venous Disease, Osteoarthritis] [2:Chronic Obstructive Pulmonary Disease (COPD), Arrhythmia,  Congestive Heart Failure, Coronary Artery Disease, Hypertension, Peripheral Venous Disease, Osteoarthritis] [N/A:N/A] Date Acquired: [1:06/01/2015] [2:06/01/2015] [N/A:N/A] Weeks of Treatment: [1:1] [2:1] [N/A:N/A] Wound Status: [1:Open] [2:Open] [N/A:N/A] Measurements L x W x D 7x2.7x0.1 [2:6x2.9x0.1] [N/A:N/A] (cm) Area (cm) : [1:14.844] [2:13.666] [N/A:N/A] Volume (cm) : [  1:1.484] [2:1.367] [N/A:N/A] % Reduction in Area: [1:-80.00%] [2:12.10%] [N/A:N/A] % Reduction in Volume: -79.90% [2:12.10%] [N/A:N/A] Classification: [1:Category/Stage II] [2:Category/Stage II] [N/A:N/A] Exudate Amount: [1:Large] [2:Large] [N/A:N/A] Exudate Type: [1:Serous] [2:Serous] [N/A:N/A] Exudate Color: [1:amber] [2:amber] [N/A:N/A] Wound Margin: [1:Flat and Intact] [2:Flat and Intact] [N/A:N/A] Granulation Amount: [1:None Present (0%)] [2:Small (1-33%)] [N/A:N/A] Necrotic Amount: [1:Large (67-100%)] [2:Large (67-100%)] [N/A:N/A] Necrotic Tissue: [1:Eschar] [2:Adherent Slough] [N/A:N/A] Exposed Structures: [N/A:N/A] Fascia: No Fascia: No Fat: No Fat: No Tendon: No Tendon: No Muscle: No Muscle: No Joint: No Joint: No Bone: No Bone: No Limited to Skin Limited to Skin Breakdown Breakdown Epithelialization: None None N/A Periwound Skin Texture: Edema: Yes Edema: Yes N/A Periwound Skin Moist: Yes Moist: Yes N/A Moisture: Periwound Skin Color: Erythema: Yes Erythema: Yes N/A Erythema  Location: Circumferential Circumferential N/A Temperature: No Abnormality No Abnormality N/A Tenderness on Yes Yes N/A Palpation: Wound Preparation: Ulcer Cleansing: Ulcer Cleansing: N/A Rinsed/Irrigated with Rinsed/Irrigated with Saline Saline Topical Anesthetic Topical Anesthetic Applied: Other: lidocaine Applied: Other: lidocaine 4% 4% Treatment Notes Electronic Signature(s) Signed: 10/06/2015 5:25:44 PM By: Alric Quan Entered By: Alric Quan on 10/06/2015 11:43:18 [name] ([id number]) -------------------------------------------------------------------------------- George Details Patient Name: [name], Ronzell B. Date of Service: 10/06/2015 11:00 AM Medical Record Number: [id number] Patient Account Number: [id number] Date of Birth/Sex: 1946/04/18 (70 y.o. Male) Treating RN: Ahmed Prima Primary Care Physician: Fulton Reek Other Clinician: Referring Physician: Fulton Reek Treating Physician/Extender: Frann Rider in Treatment: 1 Active Inactive Electronic Signature(s) Signed: 10/06/2015 5:25:44 PM By: Alric Quan Entered By: Alric Quan on 10/06/2015 11:50:06 [name] ([id number]) -------------------------------------------------------------------------------- Pain Assessment Details Patient Name: [name], Jason B. Date of Service: 10/06/2015 11:00 AM Medical Record Number: [id number] Patient Account Number: [id number] Date of Birth/Sex: 04-11-1946 (70 y.o. Male) Treating RN: Ahmed Prima Primary Care Physician: Fulton Reek Other Clinician: Referring Physician: Fulton Reek Treating Physician/Extender: Frann Rider in Treatment: 1 Active Problems Location of Pain Severity and Description of Pain Patient Has Paino Yes Site Locations Pain Location: Pain in Ulcers With Dressing Change: Yes Duration of the Pain. Constant / Intermittento Constant Rate the pain. Current  Pain Level: 8 Character of Pain Describe the Pain: Aching, Stabbing, Throbbing Pain Management and Medication Current Pain Management: Electronic Signature(s) Signed: 10/06/2015 5:25:44 PM By: Alric Quan Entered By: Alric Quan on 10/06/2015 11:32:53 [name] ([id number]) -------------------------------------------------------------------------------- Patient/Caregiver Education Details Patient Name: [name], Lowen B. Date of Service: 10/06/2015 11:00 AM Medical Record Number: [id number] Patient Account Number: [id number] Date of Birth/Gender: 1945/07/02 (70 y.o. Male) Treating RN: Ahmed Prima Primary Care Physician: Fulton Reek Other Clinician: Referring Physician: Fulton Reek Treating Physician/Extender: Frann Rider in Treatment: 1 Education Assessment Education Provided To: Patient Education Topics Provided Wound/Skin Impairment: Handouts: Other: change dressing as ordered Methods: Demonstration, Explain/Verbal Responses: State content correctly Electronic Signature(s) Signed: 10/06/2015 5:25:44 PM By: Alric Quan Entered By: Alric Quan on 10/06/2015 11:49:08 [name] ([id number]) -------------------------------------------------------------------------------- Wound Assessment Details Patient Name: [name], Rilley B. Date of Service: 10/06/2015 11:00 AM Medical Record Number: [id number] Patient Account Number: [id number] Date of Birth/Sex: 06-12-1945 (70 y.o. Male) Treating RN: Ahmed Prima Primary Care Physician: Fulton Reek Other Clinician: Referring Physician: Fulton Reek Treating Physician/Extender: Frann Rider in Treatment: 1 Wound Status Wound Number: 1 Primary Pressure Ulcer Etiology: Wound Location: Left Gluteus Wound Open Wounding Event: Gradually Appeared Status: Date Acquired: 06/01/2015 Comorbid Chronic Obstructive Pulmonary Disease Weeks Of Treatment: 1 History: (COPD),  Arrhythmia, Congestive Heart Clustered Wound: No Failure, Coronary Artery Disease, Hypertension, Peripheral Venous  Disease, Osteoarthritis Photos Photo Uploaded By: Alric Quan on 10/06/2015 16:45:04 Wound Measurements Length: (cm) 7 Width: (cm) 2.7 Depth: (cm) 0.1 Area: (cm) 14.844 Volume: (cm) 1.484 % Reduction in Area: -80% % Reduction in Volume: -79.9% Epithelialization: None Tunneling: No Undermining: No Wound Description Classification: Category/Stage II Wound Margin: Flat and Intact Exudate Amount: Large Exudate Type: Serous Exudate Color: amber Foul Odor After Cleansing: No Wound Bed Granulation Amount: None Present (0%) Exposed Structure Necrotic Amount: Large (67-100%) Fascia Exposed: No Kozlov, Ayuub B. ([id number]) Necrotic Quality: Eschar Fat Layer Exposed: No Tendon Exposed: No Muscle Exposed: No Joint Exposed: No Bone Exposed: No Limited to Skin Breakdown Periwound Skin Texture Texture Color No Abnormalities Noted: No No Abnormalities Noted: No Localized Edema: Yes Erythema: Yes Erythema Location: Circumferential Moisture No Abnormalities Noted: No Temperature / Pain Moist: Yes Temperature: No Abnormality Tenderness on Palpation: Yes Wound Preparation Ulcer Cleansing: Rinsed/Irrigated with Saline Topical Anesthetic Applied: Other: lidocaine 4%, Treatment Notes Wound #1 (Left Gluteus) 1. Cleansed with: Clean wound with Normal Saline 2. Anesthetic Topical Lidocaine 4% cream to wound bed prior to debridement 3. Peri-wound Care: Skin Prep 4. Dressing Applied: Santyl Ointment 5. Secondary Dressing Applied Bordered Foam Dressing Dry Gauze Electronic Signature(s) Signed: 10/06/2015 5:25:44 PM By: Alric Quan Entered By: Alric Quan on 10/06/2015 11:42:19 [name] ([id number]) -------------------------------------------------------------------------------- Wound Assessment Details Patient Name: [name],  Kazmir B. Date of Service: 10/06/2015 11:00 AM Medical Record Number: [id number] Patient Account Number: [id number] Date of Birth/Sex: Sep 12, 1945 (70 y.o. Male) Treating RN: Ahmed Prima Primary Care Physician: Fulton Reek Other Clinician: Referring Physician: Fulton Reek Treating Physician/Extender: Frann Rider in Treatment: 1 Wound Status Wound Number: 2 Primary Pressure Ulcer Etiology: Wound Location: Right Gluteus Wound Open Wounding Event: Gradually Appeared Status: Date Acquired: 06/01/2015 Comorbid Chronic Obstructive Pulmonary Disease Weeks Of Treatment: 1 History: (COPD), Arrhythmia, Congestive Heart Clustered Wound: No Failure, Coronary Artery Disease, Hypertension, Peripheral Venous Disease, Osteoarthritis Photos Photo Uploaded By: Alric Quan on 10/06/2015 16:45:04 Wound Measurements Length: (cm) 6 Width: (cm) 2.9 Depth: (cm) 0.1 Area: (cm) 13.666 Volume: (cm) 1.367 % Reduction in Area: 12.1% % Reduction in Volume: 12.1% Epithelialization: None Tunneling: No Undermining: No Wound Description Classification: Category/Stage II Wound Margin: Flat and Intact Exudate Amount: Large Exudate Type: Serous Exudate Color: amber Foul Odor After Cleansing: No Wound Bed Granulation Amount: Small (1-33%) Exposed Structure Necrotic Amount: Large (67-100%) Fascia Exposed: No Jarquin, Raydan B. ([id number]) Necrotic Quality: Adherent Slough Fat Layer Exposed: No Tendon Exposed: No Muscle Exposed: No Joint Exposed: No Bone Exposed: No Limited to Skin Breakdown Periwound Skin Texture Texture Color No Abnormalities Noted: No No Abnormalities Noted: No Localized Edema: Yes Erythema: Yes Erythema Location: Circumferential Moisture No Abnormalities Noted: No Temperature / Pain Moist: Yes Temperature: No Abnormality Tenderness on Palpation: Yes Wound Preparation Ulcer Cleansing: Rinsed/Irrigated with Saline Topical Anesthetic  Applied: Other: lidocaine 4%, Treatment Notes Wound #2 (Right Gluteus) 1. Cleansed with: Clean wound with Normal Saline 2. Anesthetic Topical Lidocaine 4% cream to wound bed prior to debridement 3. Peri-wound Care: Skin Prep 4. Dressing Applied: Santyl Ointment 5. Secondary Dressing Applied Bordered Foam Dressing Dry Gauze Electronic Signature(s) Signed: 10/06/2015 5:25:44 PM By: Alric Quan Entered By: Alric Quan on 10/06/2015 11:42:48 [name] ([id number]) -------------------------------------------------------------------------------- Vitals Details Patient Name: [name], Jasai B. Date of Service: 10/06/2015 11:00 AM Medical Record Number: [id number] Patient Account Number: [id number] Date of Birth/Sex: April 08, 1946 (70 y.o. Male) Treating RN: Ahmed Prima Primary Care Physician: Fulton Reek Other Clinician: Referring Physician: Fulton Reek  Treating Physician/Extender: Frann Rider in Treatment: 1 Vital Signs Time Taken: 11:32 Temperature (F): 98.7 Height (in): 68 Pulse (bpm): 65 Weight (lbs): 175 Respiratory Rate (breaths/min): 20 Body Mass Index (BMI): 26.6 Blood Pressure (mmHg): 120/53 Reference Range: 80 - 120 mg / dl Electronic Signature(s) Signed: 10/06/2015 5:25:44 PM By: Alric Quan Entered By: Alric Quan on 10/06/2015 11:34:36

## 2015-10-07 NOTE — Progress Notes (Signed)
[name], [name] ([id number]) Visit Report for 10/06/2015 Chief Complaint Document Details Patient Name: [name], [name] Date of Service: 10/06/2015 11:00 AM Medical Record Number: [id number] Patient Account Number: [id number] Date of Birth/Sex: 1946-03-03 (70 y.o. Male) Treating RN: Ahmed Prima Primary Care Physician: Fulton Reek Other Clinician: Referring Physician: Fulton Reek Treating Physician/Extender: Frann Rider in Treatment: 1 Information Obtained from: Patient Chief Complaint Patient presents to the wound care center for a consult due non healing wound for about 4 months now Electronic Signature(s) Signed: 10/06/2015 11:59:18 AM By: Christin Fudge MD, FACS Entered By: Christin Fudge on 10/06/2015 11:59:17 [name] ([id number]) -------------------------------------------------------------------------------- HPI Details Patient Name: [name], Becker B. Date of Service: 10/06/2015 11:00 AM Medical Record Number: [id number] Patient Account Number: [id number] Date of Birth/Sex: 1946-03-26 (70 y.o. Male) Treating RN: Ahmed Prima Primary Care Physician: Fulton Reek Other Clinician: Referring Physician: Fulton Reek Treating Physician/Extender: Frann Rider in Treatment: 1 History of Present Illness Location: ulcerated areas in the region of the sacrum bilaterally Quality: Patient reports experiencing a shooting pain to affected area(s). Severity: Patient states wound are getting worse. Duration: Patient has had the wound for > 3 months prior to seeking treatment at the wound center Timing: Pain in wound is constant (hurts all the time) Context: The wound would happen gradually Modifying Factors: Other treatment(s) tried include:has been treated by his PCP and has already been on chronic pain medications Associated Signs and Symptoms: Patient reports having difficulty standing for long periods. HPI Description: 70 year old  gentleman who was referred to [place] by his PCP Dr. Fulton Reek for decubitus ulcers in the region of the sacrum which are very painful and these had them for about 4 months now. The patient's past medical history includes aortic stenosis, atrial fibrillation, coronary artery disease, CHF, COPD, coronary artery disease, prediabetes, peripheral vascular disease, nicotine addiction. In the past he's had carotid artery angioplasty and coronary artery bypass graft and VSD repair. He now has a chronic hip condition which is causing him a lot of pain and he goes to a pain clinic for a long while for this. He's been a smoker for over 40 years and smokes about a pack of cigarettes a day. His last hemoglobin A1c done in February of this year was 5.7. 10/06/2015 -- he continues to have a lot of pain around his decubitus ulcers and other than that has been trying his best to offload. He is also working on his smoking cessation. Electronic Signature(s) Signed: 10/06/2015 12:00:00 PM By: Christin Fudge MD, FACS Entered By: Christin Fudge on 10/06/2015 12:00:00 [name] ([id number]) -------------------------------------------------------------------------------- Physical Exam Details Patient Name: [name], Terrelle B. Date of Service: 10/06/2015 11:00 AM Medical Record Number: [id number] Patient Account Number: [id number] Date of Birth/Sex: January 11, 1946 (70 y.o. Male) Treating RN: Ahmed Prima Primary Care Physician: Fulton Reek Other Clinician: Referring Physician: Fulton Reek Treating Physician/Extender: Frann Rider in Treatment: 1 Constitutional . Pulse regular. Respirations normal and unlabored. Afebrile. . Eyes Nonicteric. Reactive to light. Ears, Nose, Mouth, and Throat Lips, teeth, and gums WNL.Marland Kitchen Moist mucosa without lesions. Neck supple and nontender. No palpable supraclavicular or cervical adenopathy. Normal sized without goiter. Respiratory WNL. No  retractions.. Cardiovascular Pedal Pulses WNL. No clubbing, cyanosis or edema. Lymphatic No adneopathy. No adenopathy. No adenopathy. Musculoskeletal Adexa without tenderness or enlargement.. Digits and nails w/o clubbing, cyanosis, infection, petechiae, ischemia, or inflammatory conditions.. Integumentary (Hair, Skin) No suspicious lesions. No crepitus or fluctuance. No peri-wound warmth or erythema. No masses.Marland Kitchen Psychiatric Judgement  and insight Intact.. No evidence of depression, anxiety, or agitation.. Notes the ulcerations continue to have significant amount of slough but in spite of using local lidocaine he says his pain is about an 8 out of 10 and hence debridement is not possible. Electronic Signature(s) Signed: 10/06/2015 12:00:47 PM By: Christin Fudge MD, FACS Entered By: Christin Fudge on 10/06/2015 12:00:47 [name] ([id number]) -------------------------------------------------------------------------------- Physician Orders Details Patient Name: [name], [name] Date of Service: 10/06/2015 11:00 AM Medical Record Number: [id number] Patient Account Number: [id number] Date of Birth/Sex: 1946-02-02 (70 y.o. Male) Treating RN: Ahmed Prima Primary Care Physician: Fulton Reek Other Clinician: Referring Physician: Fulton Reek Treating Physician/Extender: Frann Rider in Treatment: 1 Verbal / Phone Orders: Yes Clinician: Pinkerton, Debi Read Back and Verified: Yes Diagnosis Coding Wound Cleansing Wound #1 Left Gluteus o Clean wound with Normal Saline. o Cleanse wound with mild soap and water o May Shower, gently pat wound dry prior to applying new dressing. Wound #2 Right Gluteus o Clean wound with Normal Saline. o Cleanse wound with mild soap and water o May Shower, gently pat wound dry prior to applying new dressing. Anesthetic Wound #1 Left Gluteus o Topical Lidocaine 4% cream applied to wound bed prior to debridement - for  clinic use Wound #2 Right Gluteus o Topical Lidocaine 4% cream applied to wound bed prior to debridement - for clinic use Skin Barriers/Peri-Wound Care Wound #1 Left Gluteus o Skin Prep o Barrier cream Wound #2 Right Gluteus o Skin Prep o Barrier cream Primary Wound Dressing Wound #1 Left Gluteus o Santyl Ointment Wound #2 Right Gluteus o Santyl Ointment Secondary Dressing Wound #1 Left Gluteus Knowlton, Ric B. ([id number]) o Dry Gauze o Boardered Foam Dressing Wound #2 Right Gluteus o Dry Gauze o Boardered Foam Dressing Dressing Change Frequency Wound #1 Left Gluteus o Change dressing every day. Wound #2 Right Gluteus o Change dressing every day. Follow-up Appointments Wound #1 Left Gluteus o Return Appointment in 1 week. Wound #2 Right Gluteus o Return Appointment in 1 week. Off-Loading Wound #1 Left Gluteus o Turn and reposition every 2 hours Wound #2 Right Gluteus o Turn and reposition every 2 hours Additional Orders / Instructions Wound #1 Left Gluteus o Stop Smoking o Increase protein intake. Wound #2 Right Gluteus o Stop Smoking o Increase protein intake. Medications-please add to medication list. Wound #1 Left Gluteus o Other: - Vitamin C, Zinc, Multivitamins Wound #2 Right Gluteus o Santyl Enzymatic Ointment o Other: - Vitamin C, Zinc, Multivitamins Maksim, Sicotte Medhansh B. ([id number]) Electronic Signature(s) Signed: 10/06/2015 4:16:32 PM By: Christin Fudge MD, FACS Signed: 10/06/2015 5:25:44 PM By: Alric Quan Entered By: Alric Quan on 10/06/2015 11:50:49 [name] ([id number]) -------------------------------------------------------------------------------- Prescription 10/06/2015 Patient Name: [name], Macio B. Physician: Christin Fudge MD Date of Birth: 21-Sep-1945 NPI#: [id number] Sex: Valeta Harms: [id number] Phone #: [phone] License #: Patient Address: [facility] [address] [place], [place] [address] [facility] [address] [place], [place] [address] [phone] Allergies Celebrex Reaction: chest pain Severity: Severe penicillin Reaction: unknown (childhood) Severity: Severe Physician's Orders Santyl Enzymatic Ointment Signature(s): Date(s): Electronic Signature(s) Signed: 10/06/2015 4:16:32 PM By: Christin Fudge MD, FACS Signed: 10/06/2015 5:25:44 PM By: Alric Quan Entered By: Alric Quan on 10/06/2015 11:50:49 [name] ([id number]) [name], Adger B. ([id number]) --------------------------------------------------------------------------------  Problem List Details Patient Name: [name], Cheston B. Date of Service: 10/06/2015 11:00 AM Medical Record Number: [id number] Patient Account Number: [id number] Date of Birth/Sex: 07-31-1945 (69  y.o. Male) Treating RN: Carolyne Fiscal, Debi Primary Care Physician: Fulton Reek Other Clinician: Referring Physician: Fulton Reek Treating Physician/Extender: Frann Rider in Treatment: 1 Active Problems ICD-10 Encounter Code Description Active Date Diagnosis L89.153 Pressure ulcer of sacral region, stage 3 09/29/2015 Yes F17.218 Nicotine dependence, cigarettes, with other nicotine- 09/29/2015 Yes induced disorders G89.4 Chronic pain syndrome 09/29/2015 Yes Inactive Problems Resolved Problems Electronic Signature(s) Signed: 10/06/2015 11:59:07 AM By: Christin Fudge MD, FACS Entered By: Christin Fudge on 10/06/2015 11:59:06 [name] ([id number]) -------------------------------------------------------------------------------- Progress Note Details Patient Name: [name], Jamieon B. Date of Service: 10/06/2015 11:00 AM Medical Record Number: [id number] Patient Account Number: [id number] Date of Birth/Sex: May 13, 1945 (70 y.o. Male) Treating RN: Ahmed Prima Primary Care Physician: Fulton Reek Other Clinician: Referring Physician: Fulton Reek Treating Physician/Extender: Frann Rider in Treatment: 1 Subjective Chief Complaint Information obtained from Patient Patient presents to the wound care center for a consult due non healing wound for about 4 months now History of Present Illness (HPI) The following HPI elements were documented for the patient's wound: Location: ulcerated areas in the region of the sacrum bilaterally Quality: Patient reports experiencing a shooting pain to affected area(s). Severity: Patient states wound are getting worse. Duration: Patient has had the wound for > 3 months prior to seeking treatment at the wound center Timing: Pain in wound is constant (hurts all the time) Context: The wound would happen gradually Modifying Factors: Other treatment(s) tried include:has been treated by his PCP and has already been on chronic pain medications Associated Signs and Symptoms: Patient reports having difficulty standing for long periods. 70 year old gentleman who was referred to [place] by his PCP Dr. Fulton Reek for decubitus ulcers in the region of the sacrum which are very painful and these had them for about 4 months now. The patient's past medical history includes aortic stenosis, atrial fibrillation, coronary artery disease, CHF, COPD, coronary artery disease, prediabetes, peripheral vascular disease, nicotine addiction. In the past he's had carotid artery angioplasty and coronary artery bypass graft and VSD repair. He now has a chronic hip condition which is causing him a lot of pain and he goes to a pain clinic for a long while for this. He's been a smoker for over 40 years and smokes about a pack of cigarettes a day. His last hemoglobin A1c done in February of this year was 5.7. 10/06/2015 -- he continues to have a lot of pain around his decubitus ulcers and other than that has been trying his best to offload. He is also working on his  smoking cessation. Objective Constitutional Pulse regular. Respirations normal and unlabored. Afebrile. Vitals Time Taken: 11:32 AM, Height: 68 in, Weight: 175 lbs, BMI: 26.6, Temperature: 98.7 F, Pulse: 65 Pagliuca, Yul B. ([id number]) bpm, Respiratory Rate: 20 breaths/min, Blood Pressure: 120/53 mmHg. Eyes Nonicteric. Reactive to light. Ears, Nose, Mouth, and Throat Lips, teeth, and gums WNL.Marland Kitchen Moist mucosa without lesions. Neck supple and nontender. No palpable supraclavicular or cervical adenopathy. Normal sized without goiter. Respiratory WNL. No retractions.. Cardiovascular Pedal Pulses WNL. No clubbing, cyanosis or edema. Lymphatic No adneopathy. No adenopathy. No adenopathy. Musculoskeletal Adexa without tenderness or enlargement.. Digits and nails w/o clubbing, cyanosis, infection, petechiae, ischemia, or inflammatory conditions.Marland Kitchen Psychiatric Judgement and insight Intact.. No evidence of depression, anxiety, or agitation.. General Notes: the ulcerations continue to have significant amount of slough but in spite of using local lidocaine he says his pain is about an 8 out of 10 and hence debridement is not possible. Integumentary (Hair,  Skin) No suspicious lesions. No crepitus or fluctuance. No peri-wound warmth or erythema. No masses.. Wound #1 status is Open. Original cause of wound was Gradually Appeared. The wound is located on the Left Gluteus. The wound measures 7cm length x 2.7cm width x 0.1cm depth; 14.844cm^2 area and 1.484cm^3 volume. The wound is limited to skin breakdown. There is no tunneling or undermining noted. There is a large amount of serous drainage noted. The wound margin is flat and intact. There is no granulation within the wound bed. There is a large (67-100%) amount of necrotic tissue within the wound bed including Eschar. The periwound skin appearance exhibited: Localized Edema, Moist, Erythema. The surrounding wound skin color is noted with  erythema which is circumferential. Periwound temperature was noted as No Abnormality. The periwound has tenderness on palpation. Wound #2 status is Open. Original cause of wound was Gradually Appeared. The wound is located on the Right Gluteus. The wound measures 6cm length x 2.9cm width x 0.1cm depth; 13.666cm^2 area and 1.367cm^3 volume. The wound is limited to skin breakdown. There is no tunneling or undermining noted. There is a large amount of serous drainage noted. The wound margin is flat and intact. There is small (1-33%) granulation within the wound bed. There is a large (67-100%) amount of necrotic tissue within the wound bed including Adherent Slough. The periwound skin appearance exhibited: Localized Edema, Moist, Erythema. The surrounding wound skin color is noted with erythema which is circumferential. Periwound Haye, Aveer B. ([id number]) temperature was noted as No Abnormality. The periwound has tenderness on palpation. Assessment Active Problems ICD-10 L89.153 - Pressure ulcer of sacral region, stage 3 F17.218 - Nicotine dependence, cigarettes, with other nicotine-induced disorders G89.4 - Chronic pain syndrome I have recommended: 1. Offloading and details of this have been discussed with him at great lengths 2. Santyl ointment locally and protecting it with a bordered foam dressing. 3. Adequate protein nutrition and vitamin C, zinc supplements. he thinks vitamin A is causing him swelling of the legs and so years. This. 4. we have had a discussion about the need to completely give up smoking. he and his wife have had all questions answered and the patient says she's been to be compliant Plan Wound Cleansing: Wound #1 Left Gluteus: Clean wound with Normal Saline. Cleanse wound with mild soap and water May Shower, gently pat wound dry prior to applying new dressing. Wound #2 Right Gluteus: Clean wound with Normal Saline. Cleanse wound with mild soap and water May  Shower, gently pat wound dry prior to applying new dressing. Anesthetic: Wound #1 Left Gluteus: Topical Lidocaine 4% cream applied to wound bed prior to debridement - for clinic use Wound #2 Right Gluteus: Topical Lidocaine 4% cream applied to wound bed prior to debridement - for clinic use Skin Barriers/Peri-Wound Care: Wound #1 Left Gluteus: Skin Prep Barrier cream Larry, Bartos Jaeshaun B. ([id number]) Wound #2 Right Gluteus: Skin Prep Barrier cream Primary Wound Dressing: Wound #1 Left Gluteus: Santyl Ointment Wound #2 Right Gluteus: Santyl Ointment Secondary Dressing: Wound #1 Left Gluteus: Dry Gauze Boardered Foam Dressing Wound #2 Right Gluteus: Dry Gauze Boardered Foam Dressing Dressing Change Frequency: Wound #1 Left Gluteus: Change dressing every day. Wound #2 Right Gluteus: Change dressing every day. Follow-up Appointments: Wound #1 Left Gluteus: Return Appointment in 1 week. Wound #2 Right Gluteus: Return Appointment in 1 week. Off-Loading: Wound #1 Left Gluteus: Turn and reposition every 2 hours Wound #2 Right Gluteus: Turn and reposition every 2 hours Additional Orders / Instructions: Wound #  1 Left Gluteus: Stop Smoking Increase protein intake. Wound #2 Right Gluteus: Stop Smoking Increase protein intake. Medications-please add to medication list.: Wound #1 Left Gluteus: Other: - Vitamin C, Zinc, Multivitamins Wound #2 Right Gluteus: Santyl Enzymatic Ointment Other: - Vitamin C, Zinc, Multivitamins I have recommended: Steichen, Armanii B. ([id number]) 1. Offloading and details of this have been discussed with him at great lengths 2. Santyl ointment locally and protecting it with a bordered foam dressing. 3. Adequate protein nutrition and vitamin C, zinc supplements. he thinks vitamin A is causing him swelling of the legs and so years. This. 4. we have had a discussion about the need to completely give up smoking. he and his wife have had all questions  answered and the patient says she's been to be compliant Electronic Signature(s) Signed: 10/06/2015 12:02:28 PM By: Christin Fudge MD, FACS Entered By: Christin Fudge on 10/06/2015 12:02:28 [name] ([id number]) -------------------------------------------------------------------------------- SuperBill Details Patient Name: [name], Vernard B. Date of Service: 10/06/2015 Medical Record Number: [id number] Patient Account Number: [id number] Date of Birth/Sex: 1946/03/14 (70 y.o. Male) Treating RN: Ahmed Prima Primary Care Physician: Fulton Reek Other Clinician: Referring Physician: Fulton Reek Treating Physician/Extender: Frann Rider in Treatment: 1 Diagnosis Coding ICD-10 Codes Code Description [phone] Pressure ulcer of sacral region, stage 3 F17.218 Nicotine dependence, cigarettes, with other nicotine-induced disorders G89.4 Chronic pain syndrome Facility Procedures CPT4 Code: [id number] Description: 99213 - WOUND CARE VISIT-LEV 3 EST PT Modifier: Quantity: 1 Physician Procedures CPT4 Code Description: [id number] - WC PHYS LEVEL 3 - EST PT ICD-10 Description Diagnosis L89.153 Pressure ulcer of sacral region, stage 3 F17.218 Nicotine dependence, cigarettes, with other nicotin G89.4 Chronic pain syndrome Modifier: e-induced dis Quantity: 1 orders Electronic Signature(s) Signed: 10/06/2015 5:25:44 PM By: Alric Quan Previous Signature: 10/06/2015 12:02:44 PM Version By: Christin Fudge MD, FACS Entered By: Alric Quan on 10/06/2015 16:41:15

## 2015-10-13 ENCOUNTER — Encounter: Payer: Medicare Other | Admitting: Surgery

## 2015-10-13 DIAGNOSIS — L89153 Pressure ulcer of sacral region, stage 3: Secondary | ICD-10-CM | POA: Diagnosis not present

## 2015-10-13 NOTE — Progress Notes (Signed)
[name], [name] ([id number]) Visit Report for 10/13/2015 Arrival Information Details Patient Name: [name], [name] Date of Service: 10/13/2015 10:45 AM Medical Record Number: [id number] Patient Account Number: [id number] Date of Birth/Sex: Nov 01, 1945 (70 y.o. Male) Treating RN: Baruch Gouty, RN, BSN, Velva Harman Primary Care Physician: Fulton Reek Other Clinician: Referring Physician: Fulton Reek Treating Physician/Extender: Frann Rider in Treatment: 2 Visit Information History Since Last Visit Added or deleted any medications: No Patient Arrived: Ambulatory Any new allergies or adverse reactions: No Arrival Time: 10:50 Had a fall or experienced change in No Accompanied By: wife activities of daily living that may affect Transfer Assistance: None risk of falls: Patient Identification Verified: Yes Signs or symptoms of abuse/neglect since last No Secondary Verification Process Yes visito Completed: Hospitalized since last visit: No Patient Requires Transmission-Based No Has Dressing in Place as Prescribed: Yes Precautions: Pain Present Now: No Patient Has Alerts: No Electronic Signature(s) Signed: 10/13/2015 2:18:57 PM By: Regan Lemming BSN, RN Entered By: Regan Lemming on 10/13/2015 10:50:36 [name] ([id number]) -------------------------------------------------------------------------------- Clinic Level of Care Assessment Details Patient Name: [name], [name] Date of Service: 10/13/2015 10:45 AM Medical Record Number: [id number] Patient Account Number: [id number] Date of Birth/Sex: 09/10/1945 (70 y.o. Male) Treating RN: Baruch Gouty, RN, BSN, [place] Primary Care Physician: Fulton Reek Other Clinician: Referring Physician: Fulton Reek Treating Physician/Extender: Frann Rider in Treatment: 2 Clinic Level of Care Assessment Items TOOL 4 Quantity Score []  - Use when only an EandM is performed on FOLLOW-UP visit 0 ASSESSMENTS - Nursing Assessment  / Reassessment X - Reassessment of Co-morbidities (includes updates in patient status) 1 10 X - Reassessment of Adherence to Treatment Plan 1 5 ASSESSMENTS - Wound and Skin Assessment / Reassessment X - Simple Wound Assessment / Reassessment - one wound 1 5 []  - Complex Wound Assessment / Reassessment - multiple wounds 0 []  - Dermatologic / Skin Assessment (not related to wound area) 0 ASSESSMENTS - Focused Assessment []  - Circumferential Edema Measurements - multi extremities 0 []  - Nutritional Assessment / Counseling / Intervention 0 []  - Lower Extremity Assessment (monofilament, tuning fork, pulses) 0 []  - Peripheral Arterial Disease Assessment (using hand held doppler) 0 ASSESSMENTS - Ostomy and/or Continence Assessment and Care []  - Incontinence Assessment and Management 0 []  - Ostomy Care Assessment and Management (repouching, etc.) 0 PROCESS - Coordination of Care X - Simple Patient / Family Education for ongoing care 1 15 []  - Complex (extensive) Patient / Family Education for ongoing care 0 []  - Staff obtains Programmer, systems, Records, Test Results / Process Orders 0 []  - Staff telephones HHA, Nursing Homes / Clarify orders / etc 0 []  - Routine Transfer to another Facility (non-emergent condition) 0 [place], [place]. ([id number]) []  - Routine Hospital Admission (non-emergent condition) 0 []  - New Admissions / Biomedical engineer / Ordering NPWT, Apligraf, etc. 0 []  - Emergency Hospital Admission (emergent condition) 0 []  - Simple Discharge Coordination 0 []  - Complex (extensive) Discharge Coordination 0 PROCESS - Special Needs []  - Pediatric / Minor Patient Management 0 []  - Isolation Patient Management 0 []  - Hearing / Language / Visual special needs 0 []  - Assessment of Community assistance (transportation, D/C planning, etc.) 0 []  - Additional assistance / Altered mentation 0 []  - Support Surface(s) Assessment (bed, cushion, seat, etc.) 0 INTERVENTIONS - Wound Cleansing /  Measurement X - Simple Wound Cleansing - one wound 1 5 []  - Complex Wound Cleansing - multiple wounds 0 X - Wound Imaging (photographs - any number of wounds)  1 5 []  - Wound Tracing (instead of photographs) 0 X - Simple Wound Measurement - one wound 1 5 []  - Complex Wound Measurement - multiple wounds 0 INTERVENTIONS - Wound Dressings []  - Small Wound Dressing one or multiple wounds 0 X - Medium Wound Dressing one or multiple wounds 1 15 []  - Large Wound Dressing one or multiple wounds 0 []  - Application of Medications - topical 0 []  - Application of Medications - injection 0 INTERVENTIONS - Miscellaneous []  - External ear exam 0 Tvedt, Goran B. ([id number]) []  - Specimen Collection (cultures, biopsies, blood, body fluids, etc.) 0 []  - Specimen(s) / Culture(s) sent or taken to Lab for analysis 0 []  - Patient Transfer (multiple staff / Harrel Lemon Lift / Similar devices) 0 []  - Simple Staple / Suture removal (25 or less) 0 []  - Complex Staple / Suture removal (26 or more) 0 []  - Hypo / Hyperglycemic Management (close monitor of Blood Glucose) 0 []  - Ankle / Brachial Index (ABI) - do not check if billed separately 0 X - Vital Signs 1 5 Has the patient been seen at the hospital within the last three years: Yes Total Score: 70 Level Of Care: New/Established - Level 2 Electronic Signature(s) Signed: 10/13/2015 2:18:57 PM By: Regan Lemming BSN, RN Entered By: Regan Lemming on 10/13/2015 11:08:58 [name] ([id number]) -------------------------------------------------------------------------------- Encounter Discharge Information Details Patient Name: [name], [name] Date of Service: 10/13/2015 10:45 AM Medical Record Number: [id number] Patient Account Number: [id number] Date of Birth/Sex: 1946/04/06 (70 y.o. Male) Treating RN: Baruch Gouty, RN, BSN, Velva Harman Primary Care Physician: Fulton Reek Other Clinician: Referring Physician: Fulton Reek Treating Physician/Extender: Frann Rider in Treatment: 2 Encounter Discharge Information Items Discharge Pain Level: 0 Discharge Condition: Stable Ambulatory Status: Ambulatory Discharge Destination: Home Transportation: Private Auto Accompanied By: self Schedule Follow-up Appointment: No Medication Reconciliation completed and provided to Patient/Care No Maddison Kilner: Provided on Clinical Summary of Care: 10/13/2015 Form Type Recipient Paper Patient PB Electronic Signature(s) Signed: 10/13/2015 11:14:45 AM By: Ruthine Dose Entered By: Ruthine Dose on 10/13/2015 11:14:45 [name] ([id number]) -------------------------------------------------------------------------------- Multi Wound Chart Details Patient Name: [name], Masaichi B. Date of Service: 10/13/2015 10:45 AM Medical Record Number: [id number] Patient Account Number: [id number] Date of Birth/Sex: 05-03-1945 (70 y.o. Male) Treating RN: Baruch Gouty, RN, BSN, Velva Harman Primary Care Physician: Fulton Reek Other Clinician: Referring Physician: Fulton Reek Treating Physician/Extender: Frann Rider in Treatment: 2 Vital Signs Height(in): 68 Pulse(bpm): 67 Weight(lbs): 175 Blood Pressure 132/68 (mmHg): Body Mass Index(BMI): 27 Temperature(F): 98.4 Respiratory Rate 18 (breaths/min): Photos: [1:No Photos] [2:No Photos] [N/A:N/A] Wound Location: [1:Left Gluteus] [2:Right Gluteus] [N/A:N/A] Wounding Event: [1:Gradually Appeared] [2:Gradually Appeared] [N/A:N/A] Primary Etiology: [1:Pressure Ulcer] [2:Pressure Ulcer] [N/A:N/A] Comorbid History: [1:N/A] [2:Chronic Obstructive Pulmonary Disease (COPD), Arrhythmia, Congestive Heart Failure, Coronary Artery Disease, Hypertension, Peripheral Venous Disease, Osteoarthritis] [N/A:N/A] Date Acquired: [1:06/01/2015] [2:06/01/2015] [N/A:N/A] Weeks of Treatment: [1:2] [2:2] [N/A:N/A] Wound Status: [1:Converted] [2:Open] [N/A:N/A] Measurements L x W x D 0x0x0 [2:8x6.5x0.1] [N/A:N/A] (cm) Area (cm) :  [1:0] [2:40.841] [N/A:N/A] Volume (cm) : [1:0] [2:4.084] [N/A:N/A] % Reduction in Area: [1:100.00%] [2:-162.60%] [N/A:N/A] % Reduction in Volume: 100.00% [2:-162.60%] [N/A:N/A] Classification: [1:Category/Stage II] [2:Category/Stage II] [N/A:N/A] Exudate Amount: [1:N/A] [2:Large] [N/A:N/A] Exudate Type: [1:N/A] [2:Serous] [N/A:N/A] Exudate Color: [1:N/A] [2:amber] [N/A:N/A] Wound Margin: [1:N/A] [2:Flat and Intact] [N/A:N/A] Granulation Amount: [1:N/A] [2:Small (1-33%)] [N/A:N/A] Necrotic Amount: [1:N/A] [2:Large (67-100%)] [N/A:N/A] Epithelialization: [1:N/A] [2:Small (1-33%)] [N/A:N/A] Periwound Skin Texture: No Abnormalities Noted [2:No Abnormalities Noted] [N/A:N/A] Periwound Skin No Abnormalities Noted Moist: Yes N/A Moisture:  Periwound Skin Color: No Abnormalities Noted Erythema: Yes N/A Erythema Location: N/A Circumferential N/A Temperature: N/A No Abnormality N/A Tenderness on No Yes N/A Palpation: Wound Preparation: N/A Ulcer Cleansing: N/A Rinsed/Irrigated with Saline Topical Anesthetic Applied: Other: lidocaine 4% Treatment Notes Electronic Signature(s) Signed: 10/13/2015 2:18:57 PM By: Regan Lemming BSN, RN Entered By: Regan Lemming on 10/13/2015 11:07:34 [name] ([id number]) -------------------------------------------------------------------------------- [place] Details Patient Name: [name], Chrisangel B. Date of Service: 10/13/2015 10:45 AM Medical Record Number: [id number] Patient Account Number: [id number] Date of Birth/Sex: 12-Aug-1945 (70 y.o. Male) Treating RN: Baruch Gouty, RN, BSN, Velva Harman Primary Care Physician: Fulton Reek Other Clinician: Referring Physician: Fulton Reek Treating Physician/Extender: Frann Rider in Treatment: 2 Active Inactive Electronic Signature(s) Signed: 10/13/2015 2:18:57 PM By: Regan Lemming BSN, RN Entered By: Regan Lemming on 10/13/2015 11:04:20 [name] -------------------------------------------------------------------------------- Pain Assessment Details Patient Name: [name], Sagan B. Date of Service: 10/13/2015 10:45 AM Medical Record Number: [id number] Patient Account Number: [id number] Date of Birth/Sex: [birth year] (70 y.o. Male) Treating RN: Baruch Gouty, RN, BSN, Velva Harman Primary Care Physician: Fulton Reek Other Clinician: Referring Physician: Fulton Reek Treating Physician/Extender: Frann Rider in Treatment: 2 Active Problems Location of Pain Severity and Description of Pain Patient Has Paino No Site Locations With Dressing Change: No Pain Management and Medication Current Pain Management: Electronic Signature(s) Signed: 10/13/2015 2:18:57 PM By: Regan Lemming BSN, RN Entered By: Regan Lemming on 10/13/2015 10:50:43 [name] ([id number]) -------------------------------------------------------------------------------- Patient/Caregiver Education Details Patient Name: [name], Janzen B. Date of Service: 10/13/2015 10:45 AM Medical Record Number: [id number] Patient Account Number: [id number] Date of Birth/Gender: 07/03/1945 (70 y.o. Male) Treating RN: Baruch Gouty, RN, BSN, Velva Harman Primary Care Physician: Fulton Reek Other Clinician: Referring Physician: Fulton Reek Treating Physician/Extender: Frann Rider in Treatment: 2 Education Assessment Education Provided To: Patient Education Topics Provided Basic Hygiene: Methods: Explain/Verbal Responses: State content correctly Wound/Skin Impairment: Methods: Explain/Verbal Responses: State content correctly Electronic Signature(s) Signed: 10/13/2015 2:18:57 PM By: Regan Lemming BSN, RN Entered By: Regan Lemming on 10/13/2015 11:13:20 [name] ([id number]) -------------------------------------------------------------------------------- Wound Assessment Details Patient Name: [name], Sheridan B. Date of Service: 10/13/2015 10:45  AM Medical Record Number: [id number] Patient Account Number: [id number] Date of Birth/Sex: [birth year] (70 y.o. Male) Treating RN: Baruch Gouty, RN, BSN, Velva Harman Primary Care Physician: Fulton Reek Other Clinician: Referring Physician: Fulton Reek Treating Physician/Extender: Frann Rider in Treatment: 2 Wound Status Wound Number: 1 Primary Etiology: Pressure Ulcer Wound Location: Left Gluteus Wound Status: Converted Wounding Event: Gradually Appeared Date Acquired: 06/01/2015 Weeks Of Treatment: 2 Clustered Wound: No Photos Photo Uploaded By: Regan Lemming on 10/13/2015 14:17:15 Wound Measurements Length: (cm) 0 % Reduction i Width: (cm) 0 % Reduction i Depth: (cm) 0 Area: (cm) 0 Volume: (cm) 0 n Area: 100% n Volume: 100% Wound Description Classification: Category/Stage II Periwound Skin Texture Texture Color No Abnormalities Noted: No No Abnormalities Noted: No Tu, Emanuelle B. ([id number]) Moisture No Abnormalities Noted: No Electronic Signature(s) Signed: 10/13/2015 2:18:57 PM By: Regan Lemming BSN, RN Entered By: Regan Lemming on 10/13/2015 10:57:24 [name] ([id number]) -------------------------------------------------------------------------------- Wound Assessment Details Patient Name: [name], Payton B. Date of Service: 10/13/2015 10:45 AM Medical Record Number: [id number] Patient Account Number: [id number] Date of Birth/Sex: 1946/02/10 (70 y.o. Male) Treating RN: Baruch Gouty, RN, BSN, Velva Harman Primary Care Physician: Fulton Reek Other Clinician: Referring Physician: Fulton Reek Treating Physician/Extender: Frann Rider in Treatment: 2 Wound Status Wound Number: 2 Primary Pressure Ulcer Etiology: Wound Location: Right Gluteus Wound Open Wounding Event: Gradually Appeared Status: Date  Acquired: 06/01/2015 Comorbid Chronic Obstructive Pulmonary Disease Weeks Of Treatment: 2 History: (COPD), Arrhythmia, Congestive Heart Clustered  Wound: No Failure, Coronary Artery Disease, Hypertension, Peripheral Venous Disease, Osteoarthritis Photos Photo Uploaded By: Regan Lemming on 10/13/2015 14:17:15 Wound Measurements Length: (cm) 8 Width: (cm) 6.5 Depth: (cm) 0.1 Area: (cm) 40.841 Volume: (cm) 4.084 % Reduction in Area: -162.6% % Reduction in Volume: -162.6% Epithelialization: Small (1-33%) Tunneling: No Undermining: No Wound Description Classification: Category/Stage II Wound Margin: Flat and Intact Dendinger, Koki B. ([id number]) Foul Odor After Cleansing: No Exudate Amount: Large Exudate Type: Serous Exudate Color: amber Wound Bed Granulation Amount: Small (1-33%) Exposed Structure Necrotic Amount: Large (67-100%) Fascia Exposed: No Necrotic Quality: Adherent Slough Fat Layer Exposed: No Tendon Exposed: No Muscle Exposed: No Joint Exposed: No Bone Exposed: No Limited to Skin Breakdown Periwound Skin Texture Texture Color No Abnormalities Noted: No No Abnormalities Noted: No Erythema: Yes Moisture Erythema Location: Circumferential No Abnormalities Noted: No Moist: Yes Temperature / Pain Temperature: No Abnormality Tenderness on Palpation: Yes Wound Preparation Ulcer Cleansing: Rinsed/Irrigated with Saline Topical Anesthetic Applied: Other: lidocaine 4%, Electronic Signature(s) Signed: 10/13/2015 2:18:57 PM By: Regan Lemming BSN, RN Entered By: Regan Lemming on 10/13/2015 10:57:59 [name] ([id number]) -------------------------------------------------------------------------------- Vitals Details Patient Name: [name], Jamesmichael B. Date of Service: 10/13/2015 10:45 AM Medical Record Number: [id number] Patient Account Number: [id number] Date of Birth/Sex: Dec 22, 1945 (70 y.o. Male) Treating RN: Baruch Gouty, RN, BSN, [place] Primary Care Physician: Fulton Reek Other Clinician: Referring Physician: Fulton Reek Treating Physician/Extender: Frann Rider in Treatment: 2 Vital  Signs Time Taken: 10:50 Temperature (F): 98.4 Height (in): 68 Pulse (bpm): 67 Weight (lbs): 175 Respiratory Rate (breaths/min): 18 Body Mass Index (BMI): 26.6 Blood Pressure (mmHg): 132/68 Reference Range: 80 - 120 mg / dl Electronic Signature(s) Signed: 10/13/2015 2:18:57 PM By: Regan Lemming BSN, RN Entered By: Regan Lemming on 10/13/2015 10:51:00

## 2015-10-13 NOTE — Progress Notes (Addendum)
IVA, BANDUCCI ([id number]) Visit Report for 10/13/2015 Chief Complaint Document Details Patient Name: [name] Date of Service: 10/13/2015 10:45 AM Medical Record Number: [id number] Patient Account Number: [id number] Date of Birth/Sex: 03-23-1946 (70 y.o. Male) Treating RN: Baruch Gouty, RN, BSN, Velva Harman Primary Care Physician: Fulton Reek Other Clinician: Referring Physician: Fulton Reek Treating Physician/Extender: Frann Rider in Treatment: 2 Information Obtained from: Patient Chief Complaint Patient presents to the wound care center for a consult due non healing wound for about 4 months now Electronic Signature(s) Signed: 10/13/2015 11:11:06 AM By: Christin Fudge MD, FACS Entered By: Christin Fudge on 10/13/2015 11:11:06 [name] ([id number]) -------------------------------------------------------------------------------- HPI Details Patient Name: [name] Date of Service: 10/13/2015 10:45 AM Medical Record Number: [id number] Patient Account Number: [id number] Date of Birth/Sex: [birth year] (70 y.o. Male) Treating RN: Baruch Gouty, RN, BSN, Velva Harman Primary Care Physician: Fulton Reek Other Clinician: Referring Physician: Fulton Reek Treating Physician/Extender: Frann Rider in Treatment: 2 History of Present Illness Location: ulcerated areas in the region of the sacrum bilaterally Quality: Patient reports experiencing a shooting pain to affected area(s). Severity: Patient states wound are getting worse. Duration: Patient has had the wound for > 3 months prior to seeking treatment at the wound center Timing: Pain in wound is constant (hurts all the time) Context: The wound would happen gradually Modifying Factors: Other treatment(s) tried include:has been treated by his PCP and has already been on chronic pain medications Associated Signs and Symptoms: Patient reports having difficulty standing for long periods. HPI Description:  70 year old gentleman who was referred to [place] by his PCP Dr. Fulton Reek for decubitus ulcers in the region of the sacrum which are very painful and these had them for about 4 months now. The patient's past medical history includes aortic stenosis, atrial fibrillation, coronary artery disease, CHF, COPD, coronary artery disease, prediabetes, peripheral vascular disease, nicotine addiction. In the past he's had carotid artery angioplasty and coronary artery bypass graft and VSD repair. He now has a chronic hip condition which is causing him a lot of pain and he goes to a pain clinic for a long while for this. He's been a smoker for over 40 years and smokes about a pack of cigarettes a day. His last hemoglobin A1c done in February of this year was 5.7. 10/06/2015 -- he continues to have a lot of pain around his decubitus ulcers and other than that has been trying his best to offload. He is also working on his smoking cessation. Electronic Signature(s) Signed: 10/13/2015 11:11:18 AM By: Christin Fudge MD, FACS Entered By: Christin Fudge on 10/13/2015 11:11:18 [name] ([id number]) -------------------------------------------------------------------------------- Physical Exam Details Patient Name: [name], [name] Date of Service: 10/13/2015 10:45 AM Medical Record Number: [id number] Patient Account Number: [id number] Date of Birth/Sex: 04-02-1946 (70 y.o. Male) Treating RN: Baruch Gouty, RN, BSN, Velva Harman Primary Care Physician: Fulton Reek Other Clinician: Referring Physician: Fulton Reek Treating Physician/Extender: Frann Rider in Treatment: 2 Constitutional . Pulse regular. Respirations normal and unlabored. Afebrile. . Eyes Nonicteric. Reactive to light. Ears, Nose, Mouth, and Throat Lips, teeth, and gums WNL.Marland Kitchen Moist mucosa without lesions. Neck supple and nontender. No palpable supraclavicular or cervical adenopathy. Normal sized without goiter. Respiratory WNL. No  retractions.. Cardiovascular Pedal Pulses WNL. No clubbing, cyanosis or edema. Lymphatic No adneopathy. No adenopathy. No adenopathy. Musculoskeletal Adexa without tenderness or enlargement.. Digits and nails w/o clubbing, cyanosis, infection, petechiae, ischemia, or inflammatory conditions.. Integumentary (Hair, Skin) No suspicious lesions. No crepitus or fluctuance. No peri-wound warmth  or erythema. No masses.Marland Kitchen Psychiatric Judgement and insight Intact.. No evidence of depression, anxiety, or agitation.. Notes the ulcerations are still covered partially with slough and some of them are looking better with clean granulation tissue. He also has had some tape burns in the region of the buttocks. No debridement is possible today as his pain is extremely high. Electronic Signature(s) Signed: 10/13/2015 11:12:09 AM By: Christin Fudge MD, FACS Entered By: Christin Fudge on 10/13/2015 11:12:08 [name] ([id number]) -------------------------------------------------------------------------------- Physician Orders Details Patient Name: [name], [name] Date of Service: 10/13/2015 10:45 AM Medical Record Number: [id number] Patient Account Number: [id number] Date of Birth/Sex: Mar 13, 1946 (70 y.o. Male) Treating RN: Baruch Gouty, RN, BSN, Velva Harman Primary Care Physician: Fulton Reek Other Clinician: Referring Physician: Fulton Reek Treating Physician/Extender: Frann Rider in Treatment: 2 Verbal / Phone Orders: Yes Clinician: Afful, RN, BSN, Rita Read Back and Verified: Yes Diagnosis Coding Wound Cleansing Wound #2 Sacrum o Clean wound with Normal Saline. o Cleanse wound with mild soap and water o May Shower, gently pat wound dry prior to applying new dressing. Anesthetic Wound #2 Sacrum o Topical Lidocaine 4% cream applied to wound bed prior to debridement - for clinic use Skin Barriers/Peri-Wound Care Wound #2 Sacrum o Skin Prep o Barrier cream Primary  Wound Dressing Wound #2 Sacrum o Santyl Ointment Secondary Dressing Wound #2 Sacrum o Dry Gauze o Boardered Foam Dressing Dressing Change Frequency Wound #2 Sacrum o Change dressing every day. Follow-up Appointments Wound #2 Sacrum o Return Appointment in 1 week. Off-Loading Wound #2 Sacrum o Turn and reposition every 2 hours [place], [name] ([id number]) Additional Orders / Instructions Wound #2 Sacrum o Stop Smoking o Increase protein intake. Medications-please add to medication list. Wound #2 Sacrum o Santyl Enzymatic Ointment o Other: - Vitamin C, Zinc, Multivitamins Electronic Signature(s) Signed: 10/13/2015 2:18:57 PM By: Regan Lemming BSN, RN Signed: 10/13/2015 5:05:24 PM By: Christin Fudge MD, FACS Entered By: Regan Lemming on 10/13/2015 11:08:10 [name] ([id number]) -------------------------------------------------------------------------------- Prescription 10/13/2015 Patient Name: [name] Physician: Christin Fudge MD Date of Birth: 07/08/1945 NPI#: [id number] Sex: Valeta Harms: [id number] Phone #: [phone] License #: Patient Address: [place] [address] [place], [place] [address] [facility] [address], [place], [place] [address] [phone] Allergies Celebrex Reaction: chest pain Severity: Severe penicillin Reaction: unknown (childhood) Severity: Severe Physician's Orders Santyl Enzymatic Ointment Signature(s): Date(s): Electronic Signature(s) Signed: 10/13/2015 2:18:57 PM By: Regan Lemming BSN, RN Signed: 10/13/2015 5:05:24 PM By: Christin Fudge MD, FACS Entered By: Regan Lemming on 10/13/2015 11:08:12 [name] ([id number]) [name], [name] BMarland Kitchen ([id number]) --------------------------------------------------------------------------------  Problem List Details Patient Name: [name] Date of Service: 10/13/2015 10:45  AM Medical Record Number: [id number] Patient Account Number: [id number] Date of Birth/Sex: Nov 21, 1945 (70 y.o. Male) Treating RN: Baruch Gouty, RN, BSN, Velva Harman Primary Care Physician: Fulton Reek Other Clinician: Referring Physician: Fulton Reek Treating Physician/Extender: Frann Rider in Treatment: 2 Active Problems ICD-10 Encounter Code Description Active Date Diagnosis L89.153 Pressure ulcer of sacral region, stage 3 09/29/2015 Yes F17.218 Nicotine dependence, cigarettes, with other nicotine- 09/29/2015 Yes induced disorders G89.4 Chronic pain syndrome 09/29/2015 Yes L89.313 Pressure ulcer of right buttock, stage 3 10/20/2015 Yes Inactive Problems Resolved Problems Electronic Signature(s) Signed: 10/20/2015 10:33:07 AM By: Christin Fudge MD, FACS Previous Signature: 10/13/2015 11:10:35 AM Version By: Christin Fudge MD, FACS Entered By: Christin Fudge on 10/20/2015 10:33:07 [name] ([id number]) -------------------------------------------------------------------------------- Progress Note Details Patient Name: [name] Date  of Service: 10/13/2015 10:45 AM Medical Record Number: [id number] Patient Account Number: [id number] Date of Birth/Sex: Jun 15, 1945 (70 y.o. Male) Treating RN: Baruch Gouty, RN, BSN, Velva Harman Primary Care Physician: Fulton Reek Other Clinician: Referring Physician: Fulton Reek Treating Physician/Extender: Frann Rider in Treatment: 2 Subjective Chief Complaint Information obtained from Patient Patient presents to the wound care center for a consult due non healing wound for about 4 months now History of Present Illness (HPI) The following HPI elements were documented for the patient's wound: Location: ulcerated areas in the region of the sacrum bilaterally Quality: Patient reports experiencing a shooting pain to affected area(s). Severity: Patient states wound are getting worse. Duration: Patient has had the wound for > 3 months  prior to seeking treatment at the wound center Timing: Pain in wound is constant (hurts all the time) Context: The wound would happen gradually Modifying Factors: Other treatment(s) tried include:has been treated by his PCP and has already been on chronic pain medications Associated Signs and Symptoms: Patient reports having difficulty standing for long periods. 70 year old gentleman who was referred to [place] by his PCP Dr. Fulton Reek for decubitus ulcers in the region of the sacrum which are very painful and these had them for about 4 months now. The patient's past medical history includes aortic stenosis, atrial fibrillation, coronary artery disease, CHF, COPD, coronary artery disease, prediabetes, peripheral vascular disease, nicotine addiction. In the past he's had carotid artery angioplasty and coronary artery bypass graft and VSD repair. He now has a chronic hip condition which is causing him a lot of pain and he goes to a pain clinic for a long while for this. He's been a smoker for over 40 years and smokes about a pack of cigarettes a day. His last hemoglobin A1c done in February of this year was 5.7. 10/06/2015 -- he continues to have a lot of pain around his decubitus ulcers and other than that has been trying his best to offload. He is also working on his smoking cessation. Objective Constitutional Pulse regular. Respirations normal and unlabored. Afebrile. Vitals Time Taken: 10:50 AM, Height: 68 in, Weight: 175 lbs, BMI: 26.6, Temperature: 98.4 F, Pulse: 67 Ir[place]oyen, Woodruff [name] ([id number]) bpm, Respiratory Rate: 18 breaths/min, Blood Pressure: 132/68 mmHg. Eyes Nonicteric. Reactive to light. Ears, Nose, Mouth, and Throat Lips, teeth, and gums WNL.Marland Kitchen Moist mucosa without lesions. Neck supple and nontender. No palpable supraclavicular or cervical adenopathy. Normal sized without goiter. Respiratory WNL. No retractions.. Cardiovascular Pedal Pulses WNL. No clubbing, cyanosis  or edema. Lymphatic No adneopathy. No adenopathy. No adenopathy. Musculoskeletal Adexa without tenderness or enlargement.. Digits and nails w/o clubbing, cyanosis, infection, petechiae, ischemia, or inflammatory conditions.Marland Kitchen Psychiatric Judgement and insight Intact.. No evidence of depression, anxiety, or agitation.. General Notes: the ulcerations are still covered partially with slough and some of them are looking better with clean granulation tissue. He also has had some tape burns in the region of the buttocks. No debridement is possible today as his pain is extremely high. Integumentary (Hair, Skin) No suspicious lesions. No crepitus or fluctuance. No peri-wound warmth or erythema. No masses.. Wound #1 status is Converted. Original cause of wound was Gradually Appeared. The wound is located on the Left Gluteus. The wound measures 0cm length x 0cm width x 0cm depth; 0cm^2 area and 0cm^3 volume. Wound #2 status is Open. Original cause of wound was Gradually Appeared. The wound is located on the Sacrum. The wound measures 8cm length x 6.5cm width x 0.1cm depth; 40.841cm^2 area and 4.084cm^3  volume. The wound is limited to skin breakdown. There is no tunneling or undermining noted. There is a large amount of serous drainage noted. The wound margin is flat and intact. There is small (1-33%) granulation within the wound bed. There is a large (67-100%) amount of necrotic tissue within the wound bed including Adherent Slough. The periwound skin appearance exhibited: Moist, Erythema. The surrounding wound skin color is noted with erythema which is circumferential. Periwound temperature was noted as No Abnormality. The periwound has tenderness on palpation. DRAXTON, PARROW ([id number]) Assessment Active Problems ICD-10 L89.153 - Pressure ulcer of sacral region, stage 3 F17.218 - Nicotine dependence, cigarettes, with other nicotine-induced disorders G89.4 - Chronic pain  syndrome Plan Wound Cleansing: Wound #2 Sacrum: Clean wound with Normal Saline. Cleanse wound with mild soap and water May Shower, gently pat wound dry prior to applying new dressing. Anesthetic: Wound #2 Sacrum: Topical Lidocaine 4% cream applied to wound bed prior to debridement - for clinic use Skin Barriers/Peri-Wound Care: Wound #2 Sacrum: Skin Prep Barrier cream Primary Wound Dressing: Wound #2 Sacrum: Santyl Ointment Secondary Dressing: Wound #2 Sacrum: Dry Gauze Boardered Foam Dressing Dressing Change Frequency: Wound #2 Sacrum: Change dressing every day. Follow-up Appointments: Wound #2 Sacrum: Return Appointment in 1 week. Off-Loading: Wound #2 Sacrum: Turn and reposition every 2 hours Additional Orders / Instructions: Wound #2 Sacrum: Stop Smoking Increase protein intake. Sandford, Badgley Keylon [name] ([id number]) Medications-please add to medication list.: Wound #2 Sacrum: Santyl Enzymatic Ointment Other: - Vitamin C, Zinc, Multivitamins I have recommended: 1. Offloading and details of this have been discussed with him at great lengths 2. Santyl ointment locally and protecting it with a bordered foam dressing. 3. Adequate protein nutrition and vitamin C, zinc supplements. he thinks vitamin A is causing him swelling of the legs and so years. This. 4. we have had a discussion about the need to completely give up smoking. he is working on it and says he's cut down significantly. he and his wife have had all questions answered and the patient says she's been to be compliant Electronic Signature(s) Signed: 10/13/2015 11:12:36 AM By: Christin Fudge MD, FACS Entered By: Christin Fudge on 10/13/2015 11:12:36 [name] ([id number]) -------------------------------------------------------------------------------- SuperBill Details Patient Name: [name], Avi [name] Date of Service: 10/13/2015 Medical Record Number: [id number] Patient Account Number: [id number] Date of  Birth/Sex: 1946-03-14 (69 y.o. Male) Treating RN: Baruch Gouty, RN, BSN, Velva Harman Primary Care Physician: Fulton Reek Other Clinician: Referring Physician: Fulton Reek Treating Physician/Extender: Frann Rider in Treatment: 2 Diagnosis Coding ICD-10 Codes Code Description [phone] Pressure ulcer of sacral region, stage 3 F17.218 Nicotine dependence, cigarettes, with other nicotine-induced disorders G89.4 Chronic pain syndrome Facility Procedures CPT4 Code: [id number] Description: [phone] - WOUND CARE VISIT-LEV 2 EST PT Modifier: Quantity: 1 Physician Procedures CPT4 Code Description: [id number] - WC PHYS LEVEL 3 - EST PT ICD-10 Description Diagnosis L89.153 Pressure ulcer of sacral region, stage 3 F17.218 Nicotine dependence, cigarettes, with other nicot G89.4 Chronic pain syndrome Modifier: ine-induced di Quantity: 1 sorders Electronic Signature(s) Signed: 10/13/2015 11:17:26 AM By: Christin Fudge MD, FACS Entered By: Christin Fudge on 10/13/2015 11:17:26

## 2015-10-20 ENCOUNTER — Encounter: Payer: Medicare Other | Admitting: Surgery

## 2015-10-20 DIAGNOSIS — L89153 Pressure ulcer of sacral region, stage 3: Secondary | ICD-10-CM | POA: Diagnosis not present

## 2015-10-20 NOTE — Progress Notes (Addendum)
JOREL, NARDONE ([id number]) Visit Report for 10/20/2015 Arrival Information Details Patient Name: [name], [name] Date of Service: 10/20/2015 10:00 AM Medical Record Number: [id number] Patient Account Number: [id number] Date of Birth/Sex: 02/15/1946 (70 y.o. Male) Treating RN: Cornell Barman Primary Care Physician: Fulton Reek Other Clinician: Referring Physician: Fulton Reek Treating Physician/Extender: Frann Rider in Treatment: 3 Visit Information History Since Last Visit Added or deleted any medications: No Patient Arrived: Ambulatory Any new allergies or adverse reactions: No Arrival Time: 10:08 Had a fall or experienced change in No Accompanied By: wife activities of daily living that may affect Transfer Assistance: None risk of falls: Patient Identification Verified: Yes Signs or symptoms of abuse/neglect since last No Secondary Verification Process Yes visito Completed: Hospitalized since last visit: No Patient Requires Transmission-Based No Pain Present Now: No Precautions: Patient Has Alerts: No Electronic Signature(s) Signed: 10/20/2015 5:08:33 PM By: Gretta Cool, RN, BSN, Kim RN, BSN Entered By: Gretta Cool, RN, BSN, Kim on 10/20/2015 10:09:03 [name] ([id number]) -------------------------------------------------------------------------------- Encounter Discharge Information Details Patient Name: [name], Asuncion B. Date of Service: 10/20/2015 10:00 AM Medical Record Number: [id number] Patient Account Number: [id number] Date of Birth/Sex: January 13, 1946 (70 y.o. Male) Treating RN: Cornell Barman Primary Care Physician: Fulton Reek Other Clinician: Referring Physician: Fulton Reek Treating Physician/Extender: Frann Rider in Treatment: 3 Encounter Discharge Information Items Discharge Pain Level: 3 Discharge Condition: Stable Ambulatory Status: Ambulatory Discharge Destination: Home Transportation: Private Auto Accompanied By:  wife Schedule Follow-up Appointment: Yes Medication Reconciliation completed and provided to Patient/Care Yes Malic Rosten: Provided on Clinical Summary of Care: 10/20/2015 Form Type Recipient Paper Patient PB Electronic Signature(s) Signed: 10/20/2015 10:42:21 AM By: Ruthine Dose Entered By: Ruthine Dose on 10/20/2015 10:42:21 [name] ([id number]) -------------------------------------------------------------------------------- Multi Wound Chart Details Patient Name: [name], Artavis B. Date of Service: 10/20/2015 10:00 AM Medical Record Number: [id number] Patient Account Number: [id number] Date of Birth/Sex: 05-Aug-1945 (70 y.o. Male) Treating RN: Cornell Barman Primary Care Physician: Fulton Reek Other Clinician: Referring Physician: Fulton Reek Treating Physician/Extender: Frann Rider in Treatment: 3 Vital Signs Height(in): 68 Pulse(bpm): 72 Weight(lbs): 175 Blood Pressure 141/60 (mmHg): Body Mass Index(BMI): 27 Temperature(F): 98.5 Respiratory Rate 18 (breaths/min): Photos: [2:No Photos] [3:No Photos] [N/A:N/A] Wound Location: [2:Sacrum] [3:Right Gluteus - Distal] [N/A:N/A] Wounding Event: [2:Gradually Appeared] [3:Pressure Injury] [N/A:N/A] Primary Etiology: [2:Pressure Ulcer] [3:Pressure Ulcer] [N/A:N/A] Comorbid History: [2:Chronic Obstructive Pulmonary Disease (COPD), Arrhythmia, Congestive Heart Failure, Congestive Heart Failure, Coronary Artery Disease, Coronary Artery Disease, Hypertension, Peripheral Hypertension, Peripheral Venous Disease,  Osteoarthritis] [3:Chronic Obstructive Pulmonary Disease (COPD), Arrhythmia, Venous Disease, Osteoarthritis] [N/A:N/A] Date Acquired: [2:06/01/2015] [3:10/06/2015] [N/A:N/A] Weeks of Treatment: [2:3] [3:0] [N/A:N/A] Wound Status: [2:Open] [3:Open] [N/A:N/A] Measurements L x W x D 5x3x0.1 [3:0.9x0.4x0.1] [N/A:N/A] (cm) Area (cm) : [2:11.781] [3:0.283] [N/A:N/A] Volume (cm) : [2:1.178] [3:0.028]  [N/A:N/A] % Reduction in Area: [2:24.20%] [3:N/A] [N/A:N/A] % Reduction in Volume: 24.20% [3:N/A] [N/A:N/A] Classification: [2:Category/Stage II] [3:Category/Stage II] [N/A:N/A] Exudate Amount: [2:Large] [3:Medium] [N/A:N/A] Exudate Type: [2:Serous] [3:Serosanguineous] [N/A:N/A] Exudate Color: [2:amber] [3:red, brown] [N/A:N/A] Wound Margin: [2:Flat and Intact] [3:Flat and Intact] [N/A:N/A] Granulation Amount: [2:Medium (34-66%)] [3:Small (1-33%)] [N/A:N/A] Granulation Quality: [2:Pink] [3:Pink] [N/A:N/A] Necrotic Amount: [2:Medium (34-66%)] [3:Large (67-100%)] [N/A:N/A] Exposed Structures: [N/A:N/A] Fascia: No Fascia: No Fat: No Fat: No Tendon: No Tendon: No Muscle: No Muscle: No Joint: No Joint: No Bone: No Bone: No Limited to Skin Limited to Skin Breakdown Breakdown Epithelialization: Small (1-33%) None N/A Periwound Skin Texture: No Abnormalities Noted Edema: No N/A Excoriation: No Induration: No Callus: No Crepitus: No Fluctuance: No Friable:  No Rash: No Scarring: No Periwound Skin Moist: Yes Moist: Yes N/A Moisture: Maceration: No Dry/Scaly: No Periwound Skin Color: Erythema: Yes Atrophie Blanche: No N/A Cyanosis: No Ecchymosis: No Erythema: No Hemosiderin Staining: No Mottled: No Pallor: No Rubor: No Erythema Location: Circumferential N/A N/A Temperature: No Abnormality N/A N/A Tenderness on Yes No N/A Palpation: Wound Preparation: Ulcer Cleansing: Ulcer Cleansing: N/A Rinsed/Irrigated with Rinsed/Irrigated with Saline Saline Topical Anesthetic Topical Anesthetic Applied: Other: lidocaine Applied: Other: lidociane 4% 4% Treatment Notes Electronic Signature(s) Signed: 10/20/2015 5:08:33 PM By: Gretta Cool, RN, BSN, Kim RN, BSN Entered By: Gretta Cool, RN, BSN, Kim on 10/20/2015 10:21:39 [name] ([id number]) -------------------------------------------------------------------------------- Leroy Plan Details Patient Name:  [name], [name] Date of Service: 10/20/2015 10:00 AM Medical Record Number: [id number] Patient Account Number: [id number] Date of Birth/Sex: 1945/09/27 (70 y.o. Male) Treating RN: Cornell Barman Primary Care Physician: Fulton Reek Other Clinician: Referring Physician: Fulton Reek Treating Physician/Extender: Frann Rider in Treatment: 3 Active Inactive Orientation to the Wound Care Program Nursing Diagnoses: Knowledge deficit related to the wound healing center program Goals: Patient/caregiver will verbalize understanding of the [facility] Date Initiated: 10/20/2015 Goal Status: Active Interventions: Provide education on orientation to the wound center Notes: Pressure Nursing Diagnoses: Knowledge deficit related to management of pressures ulcers Goals: Patient will remain free from development of additional pressure ulcers Date Initiated: 10/20/2015 Goal Status: Active Interventions: Assess: immobility, friction, shearing, incontinence upon admission and as needed Notes: Electronic Signature(s) Signed: 10/20/2015 5:08:33 PM By: Gretta Cool, RN, BSN, Kim RN, BSN Entered By: Gretta Cool, RN, BSN, Kim on 10/20/2015 10:21:31 [name] ([id number]) -------------------------------------------------------------------------------- Pain Assessment Details Patient Name: [name], Donaldo B. Date of Service: 10/20/2015 10:00 AM Medical Record Number: [id number] Patient Account Number: [id number] Date of Birth/Sex: 10/20/1945 (70 y.o. Male) Treating RN: Cornell Barman Primary Care Physician: Fulton Reek Other Clinician: Referring Physician: Fulton Reek Treating Physician/Extender: Frann Rider in Treatment: 3 Active Problems Location of Pain Severity and Description of Pain Patient Has Paino No Site Locations With Dressing Change: No Pain Management and Medication Current Pain Management: Electronic Signature(s) Signed: 10/20/2015 5:08:33 PM  By: Gretta Cool, RN, BSN, Kim RN, BSN Entered By: Gretta Cool, RN, BSN, Kim on 10/20/2015 10:09:11 [name] ([id number]) -------------------------------------------------------------------------------- Patient/Caregiver Education Details Patient Name: [name], Lorin B. Date of Service: 10/20/2015 10:00 AM Medical Record Number: [id number] Patient Account Number: [id number] Date of Birth/Gender: 31-Oct-1945 (70 y.o. Male) Treating RN: Cornell Barman Primary Care Physician: Fulton Reek Other Clinician: Referring Physician: Fulton Reek Treating Physician/Extender: Frann Rider in Treatment: 3 Education Assessment Education Provided To: Patient Education Topics Provided Wound/Skin Impairment: Handouts: Caring for Your Ulcer, Other: wound care as prescribed Electronic Signature(s) Signed: 10/20/2015 5:08:33 PM By: Gretta Cool, RN, BSN, Kim RN, BSN Entered By: Gretta Cool, RN, BSN, Kim on 10/20/2015 10:39:27 [name] ([id number]) -------------------------------------------------------------------------------- Wound Assessment Details Patient Name: [name], Barrington B. Date of Service: 10/20/2015 10:00 AM Medical Record Number: [id number] Patient Account Number: [id number] Date of Birth/Sex: 04-22-1946 (70 y.o. Male) Treating RN: Cornell Barman Primary Care Physician: Fulton Reek Other Clinician: Referring Physician: Fulton Reek Treating Physician/Extender: Frann Rider in Treatment: 3 Wound Status Wound Number: 2 Primary Pressure Ulcer Etiology: Wound Location: Sacrum Wound Open Wounding Event: Gradually Appeared Status: Date Acquired: 06/01/2015 Comorbid Chronic Obstructive Pulmonary Disease Weeks Of Treatment: 3 History: (COPD), Arrhythmia, Congestive Heart Clustered Wound: No Failure, Coronary Artery Disease, Hypertension, Peripheral Venous Disease, Osteoarthritis Photos Photo Uploaded By: Gretta Cool RN, BSN, Kim on 10/20/2015 10:24:20 Wound  Measurements Length: (  cm) 5 Width: (cm) 3 Depth: (cm) 0.1 Area: (cm) 11.781 Volume: (cm) 1.178 % Reduction in Area: 24.2% % Reduction in Volume: 24.2% Epithelialization: Small (1-33%) Wound Description Classification: Category/Stage II Wound Margin: Flat and Intact Exudate Amount: Large Exudate Type: Serous Exudate Color: amber Foul Odor After Cleansing: No Wound Bed Granulation Amount: Medium (34-66%) Exposed Structure Granulation Quality: Pink Fascia Exposed: No Rottman, Theoden B. ([id number]) Necrotic Amount: Medium (34-66%) Fat Layer Exposed: No Necrotic Quality: Adherent Slough Tendon Exposed: No Muscle Exposed: No Joint Exposed: No Bone Exposed: No Limited to Skin Breakdown Periwound Skin Texture Texture Color No Abnormalities Noted: No No Abnormalities Noted: No Erythema: Yes Moisture Erythema Location: Circumferential No Abnormalities Noted: No Moist: Yes Temperature / Pain Temperature: No Abnormality Tenderness on Palpation: Yes Wound Preparation Ulcer Cleansing: Rinsed/Irrigated with Saline Topical Anesthetic Applied: Other: lidocaine 4%, Treatment Notes Wound #2 (Sacrum) 1. Cleansed with: Clean wound with Normal Saline 2. Anesthetic Topical Lidocaine 4% cream to wound bed prior to debridement 4. Dressing Applied: Santyl Ointment 5. Secondary Dressing Applied Bordered Foam Dressing Electronic Signature(s) Signed: 10/20/2015 5:08:33 PM By: Gretta Cool, RN, BSN, Kim RN, BSN Entered By: Gretta Cool, RN, BSN, Kim on 10/20/2015 10:19:15 [name] ([id number]) -------------------------------------------------------------------------------- Wound Assessment Details Patient Name: [name], Hendrik B. Date of Service: 10/20/2015 10:00 AM Medical Record Number: [id number] Patient Account Number: [id number] Date of Birth/Sex: [birth year] (70 y.o. Male) Treating RN: Cornell Barman Primary Care Physician: Fulton Reek Other Clinician: Referring Physician:  Fulton Reek Treating Physician/Extender: Frann Rider in Treatment: 3 Wound Status Wound Number: 3 Primary Pressure Ulcer Etiology: Wound Location: Right Gluteus - Distal Wound Open Wounding Event: Pressure Injury Status: Date Acquired: 10/06/2015 Comorbid Chronic Obstructive Pulmonary Disease Weeks Of Treatment: 0 History: (COPD), Arrhythmia, Congestive Heart Clustered Wound: No Failure, Coronary Artery Disease, Hypertension, Peripheral Venous Disease, Osteoarthritis Photos Photo Uploaded By: Gretta Cool, RN, BSN, Kim on 10/20/2015 10:24:20 Wound Measurements Length: (cm) 0.9 Width: (cm) 0.4 Depth: (cm) 0.1 Area: (cm) 0.283 Volume: (cm) 0.028 % Reduction in Area: % Reduction in Volume: Epithelialization: None Tunneling: No Undermining: No Wound Description Classification: Category/Stage II Wound Margin: Flat and Intact Exudate Amount: Medium Exudate Type: Serosanguineous Exudate Color: red, brown Wound Bed Granulation Amount: Small (1-33%) Exposed Structure Granulation Quality: Pink Fascia Exposed: No [place], Jamarrion B. ([id number]) Necrotic Amount: Large (67-100%) Fat Layer Exposed: No Necrotic Quality: Adherent Slough Tendon Exposed: No Muscle Exposed: No Joint Exposed: No Bone Exposed: No Limited to Skin Breakdown Periwound Skin Texture Texture Color No Abnormalities Noted: No No Abnormalities Noted: No Callus: No Atrophie Blanche: No Crepitus: No Cyanosis: No Excoriation: No Ecchymosis: No Fluctuance: No Erythema: No Friable: No Hemosiderin Staining: No Induration: No Mottled: No Localized Edema: No Pallor: No Rash: No Rubor: No Scarring: No Moisture No Abnormalities Noted: No Dry / Scaly: No Maceration: No Moist: Yes Wound Preparation Ulcer Cleansing: Rinsed/Irrigated with Saline Topical Anesthetic Applied: Other: lidociane 4%, Treatment Notes Wound #3 (Right, Distal Gluteus) 1. Cleansed with: Clean wound with Normal  Saline 2. Anesthetic Topical Lidocaine 4% cream to wound bed prior to debridement 4. Dressing Applied: Santyl Ointment 5. Secondary Dressing Applied Bordered Foam Dressing Electronic Signature(s) Signed: 10/20/2015 5:08:33 PM By: Gretta Cool, RN, BSN, Kim RN, BSN Entered By: Gretta Cool, RN, BSN, Kim on 10/20/2015 10:20:35 [name] ([id number]) -------------------------------------------------------------------------------- Vitals Details Patient Name: [name], Kaydan B. Date of Service: 10/20/2015 10:00 AM Medical Record Number: [id number] Patient Account Number: [id number] Date of Birth/Sex: Apr 07, 1946 (70 y.o. Male) Treating RN: Cornell Barman Primary Care Physician: Doy Hutching,  Dellis Filbert Other Clinician: Referring Physician: Fulton Reek Treating Physician/Extender: Frann Rider in Treatment: 3 Vital Signs Time Taken: 10:09 Temperature (F): 98.5 Height (in): 68 Pulse (bpm): 72 Weight (lbs): 175 Respiratory Rate (breaths/min): 18 Body Mass Index (BMI): 26.6 Blood Pressure (mmHg): 141/60 Reference Range: 80 - 120 mg / dl Electronic Signature(s) Signed: 10/20/2015 5:08:33 PM By: Gretta Cool, RN, BSN, Kim RN, BSN Entered By: Gretta Cool, RN, BSN, Kim on 10/20/2015 10:09:40

## 2015-10-20 NOTE — Progress Notes (Addendum)
KIRKE, CAPTAIN ([id number]) Visit Report for 10/20/2015 Chief Complaint Document Details Patient Name: [name], [name] Date of Service: 10/20/2015 10:00 AM Medical Record Number: [id number] Patient Account Number: [id number] Date of Birth/Sex: [birth year] (70 y.o. Male) Treating RN: Cornell Barman Primary Care Physician: Fulton Reek Other Clinician: Referring Physician: Fulton Reek Treating Physician/Extender: Frann Rider in Treatment: 3 Information Obtained from: Patient Chief Complaint Patient presents to the wound care center for a consult due non healing wound for about 4 months now Electronic Signature(s) Signed: 10/20/2015 10:59:47 AM By: Christin Fudge MD, FACS Entered By: Christin Fudge on 10/20/2015 10:59:47 [name] ([id number]) -------------------------------------------------------------------------------- Debridement Details Patient Name: [name], [name] Date of Service: 10/20/2015 10:00 AM Medical Record Number: [id number] Patient Account Number: [id number] Date of Birth/Sex: 03-17-1946 (70 y.o. Male) Treating RN: Cornell Barman Primary Care Physician: Fulton Reek Other Clinician: Referring Physician: Fulton Reek Treating Physician/Extender: Frann Rider in Treatment: 3 Debridement Performed for Wound #2 Sacrum Assessment: Performed By: Physician Christin Fudge, MD Debridement: Debridement Pre-procedure Yes Verification/Time Out Taken: Start Time: 10:24 Pain Control: Other : lidocaine 4% Level: Skin/Subcutaneous Tissue Total Area Debrided (L x 3 (cm) x 2 (cm) = 6 (cm) W): Tissue and other Viable, Non-Viable, Eschar, Exudate, Fibrin/Slough, Subcutaneous material debrided: Instrument: Curette Bleeding: Minimum Hemostasis Achieved: Pressure End Time: 10:27 Procedural Pain: 3 Post Procedural Pain: 0 Response to Treatment: Procedure was tolerated well Post Debridement Measurements of Total Wound Length: (cm)  3 Stage: Category/Stage II Width: (cm) 2 Depth: (cm) 0.1 Volume: (cm) 0.471 Post Procedure Diagnosis Same as Pre-procedure Electronic Signature(s) Signed: 10/20/2015 10:59:41 AM By: Christin Fudge MD, FACS Signed: 10/20/2015 5:08:33 PM By: Gretta Cool RN, BSN, Kim RN, BSN Entered By: Christin Fudge on 10/20/2015 10:59:41 [name] ([id number]) -------------------------------------------------------------------------------- HPI Details Patient Name: [name], [name] Date of Service: 10/20/2015 10:00 AM Medical Record Number: [id number] Patient Account Number: [id number] Date of Birth/Sex: November 01, 1945 (70 y.o. Male) Treating RN: Cornell Barman Primary Care Physician: Fulton Reek Other Clinician: Referring Physician: Fulton Reek Treating Physician/Extender: Frann Rider in Treatment: 3 History of Present Illness Location: ulcerated areas in the region of the sacrum bilaterally Quality: Patient reports experiencing a shooting pain to affected area(s). Severity: Patient states wound are getting worse. Duration: Patient has had the wound for > 3 months prior to seeking treatment at the wound center Timing: Pain in wound is constant (hurts all the time) Context: The wound would happen gradually Modifying Factors: Other treatment(s) tried include:has been treated by his PCP and has already been on chronic pain medications Associated Signs and Symptoms: Patient reports having difficulty standing for long periods. HPI Description: 70 year old gentleman who was referred to [place] by his PCP Dr. Fulton Reek for decubitus ulcers in the region of the sacrum which are very painful and these had them for about 4 months now. The patient's past medical history includes aortic stenosis, atrial fibrillation, coronary artery disease, CHF, COPD, coronary artery disease, prediabetes, peripheral vascular disease, nicotine addiction. In the past he's had carotid artery angioplasty and  coronary artery bypass graft and VSD repair. He now has a chronic hip condition which is causing him a lot of pain and he goes to a pain clinic for a long while for this. He's been a smoker for over 40 years and smokes about a pack of cigarettes a day. His last hemoglobin A1c done in February of this year was 5.7. 10/06/2015 -- he continues to have a lot of pain around his decubitus ulcers and  other than that has been trying his best to offload. He is also working on his smoking cessation. Electronic Signature(s) Signed: 10/20/2015 10:59:52 AM By: Christin Fudge MD, FACS Entered By: Christin Fudge on 10/20/2015 10:59:52 [name] ([id number]) -------------------------------------------------------------------------------- Physical Exam Details Patient Name: [name], [name] Date of Service: 10/20/2015 10:00 AM Medical Record Number: [id number] Patient Account Number: [id number] Date of Birth/Sex: 02-13-1946 (70 y.o. Male) Treating RN: Cornell Barman Primary Care Physician: Fulton Reek Other Clinician: Referring Physician: Fulton Reek Treating Physician/Extender: Frann Rider in Treatment: 3 Constitutional . Pulse regular. Respirations normal and unlabored. Afebrile. . Eyes Nonicteric. Reactive to light. Ears, Nose, Mouth, and Throat Lips, teeth, and gums WNL.Marland Kitchen Moist mucosa without lesions. Neck supple and nontender. No palpable supraclavicular or cervical adenopathy. Normal sized without goiter. Respiratory WNL. No retractions.. Breath sounds WNL, No rubs, rales, rhonchi, or wheeze.. Cardiovascular Heart rhythm and rate regular, no murmur or gallop.. Pedal Pulses WNL. No clubbing, cyanosis or edema. Lymphatic No adneopathy. No adenopathy. No adenopathy. Musculoskeletal Adexa without tenderness or enlargement.. Digits and nails w/o clubbing, cyanosis, infection, petechiae, ischemia, or inflammatory conditions.. Integumentary (Hair, Skin) No suspicious lesions.  No crepitus or fluctuance. No peri-wound warmth or erythema. No masses.Marland Kitchen Psychiatric Judgement and insight Intact.. No evidence of depression, anxiety, or agitation.. Notes using a #3 curet several of the areas were sharply debrided to remove subcutaneous debris. Electronic Signature(s) Signed: 10/20/2015 11:00:17 AM By: Christin Fudge MD, FACS Entered By: Christin Fudge on 10/20/2015 11:00:16 [name] ([id number]) -------------------------------------------------------------------------------- Physician Orders Details Patient Name: [name], Oziel B. Date of Service: 10/20/2015 10:00 AM Medical Record Number: [id number] Patient Account Number: [id number] Date of Birth/Sex: [birth year] (70 y.o. Male) Treating RN: Cornell Barman Primary Care Physician: Fulton Reek Other Clinician: Referring Physician: Fulton Reek Treating Physician/Extender: Frann Rider in Treatment: 3 Verbal / Phone Orders: Yes Clinician: Cornell Barman Read Back and Verified: Yes Diagnosis Coding Wound Cleansing Wound #2 Sacrum o Clean wound with Normal Saline. o Cleanse wound with mild soap and water o May Shower, gently pat wound dry prior to applying new dressing. Wound #3 Right,Distal Gluteus o Clean wound with Normal Saline. o Cleanse wound with mild soap and water o May Shower, gently pat wound dry prior to applying new dressing. Skin Barriers/Peri-Wound Care Wound #2 Sacrum o Skin Prep Wound #3 Right,Distal Gluteus o Skin Prep Primary Wound Dressing Wound #2 Sacrum o Santyl Ointment Wound #3 Right,Distal Gluteus o Santyl Ointment Secondary Dressing Wound #2 Sacrum o Dry Gauze o Boardered Foam Dressing Wound #3 Right,Distal Gluteus o Dry Gauze o Boardered Foam Dressing Dressing Change Frequency Wound #2 Sacrum Tenny, Vannak B. ([id number]) o Change dressing every day. Wound #3 Right,Distal Gluteus o Change dressing every day. Follow-up  Appointments Wound #2 Sacrum o Return Appointment in 1 week. Wound #3 Right,Distal Gluteus o Return Appointment in 1 week. Off-Loading Wound #2 Sacrum o Turn and reposition every 2 hours Wound #3 Right,Distal Gluteus o Turn and reposition every 2 hours Additional Orders / Instructions Wound #2 Sacrum o Stop Smoking o Increase protein intake. Wound #3 Right,Distal Gluteus o Stop Smoking o Increase protein intake. Medications-please add to medication list. Wound #2 Sacrum o Santyl Enzymatic Ointment - continue o Other: - Vitamin C, Zinc, Multivitamins Wound #3 Right,Distal Gluteus o Santyl Enzymatic Ointment - continue o Other: - Vitamin C, Zinc, Multivitamins Patient Medications Allergies: Celebrex, penicillin Notifications Medication Indication Start End lidocaine 10/20/2015 DOSE topical 4 % cream - cream topical during dressing changes only Electronic  Signature(s) TAIYO, KUDER ([id number]) Signed: 10/20/2015 10:57:34 AM By: Christin Fudge MD, FACS Entered By: Christin Fudge on 10/20/2015 10:57:33 [name] ([id number]) -------------------------------------------------------------------------------- Prescription 10/20/2015 Patient Name: [name], Eliab B. Physician: Christin Fudge MD Date of Birth: 1945-09-17 NPI#: [id number] Sex: Valeta Harms: [id number] Phone #: [phone] License #: Patient Address: [place] [address] [place], [place] [address] [facility] [address], [place], [place] [address] [phone] Allergies Celebrex Reaction: chest pain Severity: Severe penicillin Reaction: unknown (childhood) Severity: Severe Physician's Orders Santyl Enzymatic Ointment - continue Signature(s): Date(s): Electronic Signature(s) Signed: 10/20/2015 4:13:37 PM By: Christin Fudge MD, FACS Entered By: Christin Fudge on 10/20/2015 10:57:40 [name]  ([id number]) --------------------------------------------------------------------------------  Problem List Details Patient Name: [name], [name] Date of Service: 10/20/2015 10:00 AM Medical Record Number: [id number] Patient Account Number: [id number] Date of Birth/Sex: August 11, 1945 (70 y.o. 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 Date of Service: 10/20/2015 10:00 AM Medical Record Number: [id number] Patient Account Number: [id number] Date of Birth/Sex: [birth year] (70 y.o. Male) Treating RN: Cornell Barman Primary Care Physician: Fulton Reek Other Clinician: Referring Physician: Fulton Reek Treating Physician/Extender: Frann Rider in Treatment: 3 Subjective Chief Complaint Information obtained from Patient Patient presents to the wound care center for a consult due non healing wound for about 4 months now History of Present Illness (HPI) The following HPI elements were documented for the patient's wound: Location: ulcerated areas in the region of the sacrum bilaterally Quality: Patient reports experiencing a shooting pain to affected area(s). Severity:  Patient states wound are getting worse. Duration: Patient has had the wound for > 3 months prior to seeking treatment at the wound center Timing: Pain in wound is constant (hurts all the time) Context: The wound would happen gradually Modifying Factors: Other treatment(s) tried include:has been treated by his PCP and has already been on chronic pain medications Associated Signs and Symptoms: Patient reports having difficulty standing for long periods. 70 year old gentleman who was referred to [place] by his PCP Dr. Fulton Reek for decubitus ulcers in the region of the sacrum which are very painful and these had them for about 4 months now. The patient's past medical history includes aortic stenosis, atrial fibrillation, coronary artery disease, CHF, COPD, coronary artery disease, prediabetes, peripheral vascular disease, nicotine addiction. In the past he's had carotid artery angioplasty and coronary artery bypass graft and VSD repair. He now has a chronic hip condition which is causing him a lot of pain and he goes to a pain clinic for a long while for this. He's been a smoker for over 40 years and smokes about a pack of cigarettes a day. His last hemoglobin A1c done in February of this year was 5.7. 10/06/2015 -- he continues to have a lot of pain around his decubitus ulcers and other than that has been trying his best to offload. He is also working on  his smoking cessation. Objective Constitutional Pulse regular. Respirations normal and unlabored. Afebrile. Vitals Time Taken: 10:09 AM, Height: 68 in, Weight: 175 lbs, BMI: 26.6, Temperature: 98.5 F, Pulse: 72 Knappenberger, Braxson B. ([id number]) bpm, Respiratory Rate: 18 breaths/min, Blood Pressure: 141/60 mmHg. Eyes Nonicteric. Reactive to light. Ears, Nose, Mouth, and Throat Lips, teeth, and gums WNL.Marland Kitchen Moist mucosa without lesions. Neck supple and nontender. No palpable supraclavicular or cervical adenopathy. Normal sized without  goiter. Respiratory WNL. No retractions.. Breath sounds WNL, No rubs, rales, rhonchi, or wheeze.. Cardiovascular Heart rhythm and rate regular, no murmur or gallop.. Pedal Pulses WNL. No clubbing, cyanosis or edema. Lymphatic No adneopathy. No adenopathy. No adenopathy. Musculoskeletal Adexa without tenderness or enlargement.. Digits and nails w/o clubbing, cyanosis, infection, petechiae, ischemia, or inflammatory conditions.Marland Kitchen Psychiatric Judgement and insight Intact.. No evidence of depression, anxiety, or agitation.. General Notes: using a #3 curet several of the areas were sharply debrided to remove subcutaneous debris. Integumentary (Hair, Skin) No suspicious lesions. No crepitus or fluctuance. No peri-wound warmth or erythema. No masses.. Wound #2 status is Open. Original cause of wound was Gradually Appeared. The wound is located on the Sacrum. The wound measures 5cm length x 3cm width x 0.1cm depth; 11.781cm^2 area and 1.178cm^3 volume. The wound is limited to skin breakdown. There is a large amount of serous drainage noted. The wound margin is flat and intact. There is medium (34-66%) pink granulation within the wound bed. There is a medium (34-66%) amount of necrotic tissue within the wound bed including Adherent Slough. The periwound skin appearance exhibited: Moist, Erythema. The surrounding wound skin color is noted with erythema which is circumferential. Periwound temperature was noted as No Abnormality. The periwound has tenderness on palpation. Wound #3 status is Open. Original cause of wound was Pressure Injury. The wound is located on the Right,Distal Gluteus. The wound measures 0.9cm length x 0.4cm width x 0.1cm depth; 0.283cm^2 area and 0.028cm^3 volume. The wound is limited to skin breakdown. There is no tunneling or undermining noted. There is a medium amount of serosanguineous drainage noted. The wound margin is flat and intact. There is small (1-33%) pink  granulation within the wound bed. There is a large (67-100%) amount of necrotic tissue within the wound bed including Adherent Slough. The periwound skin appearance exhibited: Moist. The periwound skin appearance did not exhibit: Callus, Crepitus, Excoriation, Fluctuance, Aries, Jopp, Damichael B. ([id number]) Induration, Localized Edema, Rash, Scarring, Dry/Scaly, Maceration, Atrophie Blanche, Cyanosis, Ecchymosis, Hemosiderin Staining, Mottled, Pallor, Rubor, Erythema. Assessment Active Problems ICD-10 L89.153 - Pressure ulcer of sacral region, stage 3 F17.218 - Nicotine dependence, cigarettes, with other nicotine-induced disorders G89.4 - Chronic pain syndrome L89.313 - Pressure ulcer of right buttock, stage 3 Procedures Wound #2 Wound #2 is a Pressure Ulcer located on the Sacrum . There was a Skin/Subcutaneous Tissue Debridement [id number]) debridement with total area of 6 sq cm performed by Christin Fudge, MD. with the following instrument(s): Curette to remove Viable and Non-Viable tissue/material including Exudate, Fibrin/Slough, Eschar, and Subcutaneous after achieving pain control using Other (lidocaine 4%). A time out was conducted prior to the start of the procedure. A Minimum amount of bleeding was controlled with Pressure. The procedure was tolerated well with a pain level of 3 throughout and a pain level of 0 following the procedure. Post Debridement Measurements: 3cm length x 2cm width x 0.1cm depth; 0.471cm^3 volume. Post debridement Stage noted as Category/Stage II. Post procedure Diagnosis Wound #2: Same as Pre-Procedure Plan Wound Cleansing: Wound #2 Sacrum: Clean wound  with Normal Saline. Cleanse wound with mild soap and water May Shower, gently pat wound dry prior to applying new dressing. Wound #3 Right,Distal Gluteus: Clean wound with Normal Saline. Cleanse wound with mild soap and water Ruvalcaba, [name] ([id number]) May Shower, gently pat wound dry prior  to applying new dressing. Skin Barriers/Peri-Wound Care: Wound #2 Sacrum: Skin Prep Wound #3 Right,Distal Gluteus: Skin Prep Primary Wound Dressing: Wound #2 Sacrum: Santyl Ointment Wound #3 Right,Distal Gluteus: Santyl Ointment Secondary Dressing: Wound #2 Sacrum: Dry Gauze Boardered Foam Dressing Wound #3 Right,Distal Gluteus: Dry Gauze Boardered Foam Dressing Dressing Change Frequency: Wound #2 Sacrum: Change dressing every day. Wound #3 Right,Distal Gluteus: Change dressing every day. Follow-up Appointments: Wound #2 Sacrum: Return Appointment in 1 week. Wound #3 Right,Distal Gluteus: Return Appointment in 1 week. Off-Loading: Wound #2 Sacrum: Turn and reposition every 2 hours Wound #3 Right,Distal Gluteus: Turn and reposition every 2 hours Additional Orders / Instructions: Wound #2 Sacrum: Stop Smoking Increase protein intake. Wound #3 Right,Distal Gluteus: Stop Smoking Increase protein intake. Medications-please add to medication list.: Wound #2 Sacrum: Santyl Enzymatic Ointment - continue Other: - Vitamin C, Zinc, Multivitamins Wound #3 Right,Distal Gluteus: Santyl Enzymatic Ointment - continue Other: - Vitamin C, Zinc, Multivitamins The following medication(s) was prescribed: lidocaine topical 4 % cream cream topical during dressing changes only starting 10/20/2015 Carbary, [name] ([id number]) I have recommended: 1. Offloading and details of this have been discussed with him at great lengths 2. Santyl ointment locally and protecting it with a bordered foam dressing. the new wound would require a separate bordered foam as it is closer to the buttock and anus 3. We will also give him some 4% lidocaine cream to apply before his dressing changes 4. Adequate protein nutrition and vitamin C, zinc supplements. he thinks vitamin A is causing him swelling of the legs and so years. This. 5. we have had a discussion about the need to completely give up smoking.  he is working on it and says he's cut down significantly. he and his wife have had all questions answered and the patient says she's been to be compliant Electronic Signature(s) Signed: 10/20/2015 4:19:01 PM By: Christin Fudge MD, FACS Previous Signature: 10/20/2015 11:01:10 AM Version By: Christin Fudge MD, FACS Entered By: Christin Fudge on 10/20/2015 16:19:01 [name] ([id number]) -------------------------------------------------------------------------------- SuperBill Details Patient Name: [name], Lynton B. Date of Service: 10/20/2015 Medical Record Number: [id number] Patient Account Number: [id number] Date of Birth/Sex: 08/14/1945 (70 y.o. Male) Treating RN: Cornell Barman Primary Care Physician: Fulton Reek Other Clinician: Referring Physician: Fulton Reek Treating Physician/Extender: Frann Rider in Treatment: 3 Diagnosis Coding ICD-10 Codes Code Description [phone] Pressure ulcer of sacral region, stage 3 F17.218 Nicotine dependence, cigarettes, with other nicotine-induced disorders G89.4 Chronic pain syndrome L89.313 Pressure ulcer of right buttock, stage 3 Facility Procedures CPT4 Code: [id number] Description: 11042 - DEB SUBQ TISSUE 20 SQ CM/< ICD-10 Description Diagnosis L89.153 Pressure ulcer of sacral region, stage 3 L89.313 Pressure ulcer of right buttock, stage 3 Modifier: Quantity: 1 Physician Procedures CPT4 Code: [id number] Description: 99213 - WC PHYS LEVEL 3 - EST PT ICD-10 Description Diagnosis L89.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

## 2015-10-27 ENCOUNTER — Encounter: Payer: Medicare Other | Attending: Surgery | Admitting: Surgery

## 2015-10-27 DIAGNOSIS — L89153 Pressure ulcer of sacral region, stage 3: Secondary | ICD-10-CM | POA: Diagnosis not present

## 2015-10-27 DIAGNOSIS — J449 Chronic obstructive pulmonary disease, unspecified: Secondary | ICD-10-CM | POA: Diagnosis not present

## 2015-10-27 DIAGNOSIS — G894 Chronic pain syndrome: Secondary | ICD-10-CM | POA: Diagnosis not present

## 2015-10-27 DIAGNOSIS — I739 Peripheral vascular disease, unspecified: Secondary | ICD-10-CM | POA: Diagnosis not present

## 2015-10-27 DIAGNOSIS — I110 Hypertensive heart disease with heart failure: Secondary | ICD-10-CM | POA: Insufficient documentation

## 2015-10-27 DIAGNOSIS — I4891 Unspecified atrial fibrillation: Secondary | ICD-10-CM | POA: Insufficient documentation

## 2015-10-27 DIAGNOSIS — F17218 Nicotine dependence, cigarettes, with other nicotine-induced disorders: Secondary | ICD-10-CM | POA: Insufficient documentation

## 2015-10-27 DIAGNOSIS — I350 Nonrheumatic aortic (valve) stenosis: Secondary | ICD-10-CM | POA: Diagnosis not present

## 2015-10-27 DIAGNOSIS — L89313 Pressure ulcer of right buttock, stage 3: Secondary | ICD-10-CM | POA: Insufficient documentation

## 2015-10-27 DIAGNOSIS — I2510 Atherosclerotic heart disease of native coronary artery without angina pectoris: Secondary | ICD-10-CM | POA: Diagnosis not present

## 2015-10-27 DIAGNOSIS — I509 Heart failure, unspecified: Secondary | ICD-10-CM | POA: Diagnosis not present

## 2015-10-27 DIAGNOSIS — Z880 Allergy status to penicillin: Secondary | ICD-10-CM | POA: Diagnosis not present

## 2015-10-27 NOTE — Progress Notes (Signed)
MOICES, MORRICE ([id number]) Visit Report for 10/27/2015 Chief Complaint Document Details Patient Name: [name], [name] Date of Service: 10/27/2015 12:45 PM Medical Record Number: [id number] Patient Account Number: [id number] Date of Birth/Sex: [birth year] (70 y.o. Male) Treating RN: Ahmed Prima Primary Care Physician: Fulton Reek Other Clinician: Referring Physician: Fulton Reek Treating Physician/Extender: Frann Rider in Treatment: 4 Information Obtained from: Patient Chief Complaint Patient presents to the wound care center for a consult due non healing wound for about 4 months now Electronic Signature(s) Signed: 10/27/2015 1:37:34 PM By: Christin Fudge MD, FACS Entered By: Christin Fudge on 10/27/2015 13:37:34 [name] ([id number]) -------------------------------------------------------------------------------- HPI Details Patient Name: [name], [name] Date of Service: 10/27/2015 12:45 PM Medical Record Number: [id number] Patient Account Number: [id number] Date of Birth/Sex: [birth year] (70 y.o. Male) Treating RN: Ahmed Prima Primary Care Physician: Fulton Reek Other Clinician: Referring Physician: Fulton Reek Treating Physician/Extender: Frann Rider in Treatment: 4 History of Present Illness Location: ulcerated areas in the region of the sacrum bilaterally Quality: Patient reports experiencing a shooting pain to affected area(s). Severity: Patient states wound are getting worse. Duration: Patient has had the wound for > 3 months prior to seeking treatment at the wound center Timing: Pain in wound is constant (hurts all the time) Context: The wound would happen gradually Modifying Factors: Other treatment(s) tried include:has been treated by his PCP and has already been on chronic pain medications Associated Signs and Symptoms: Patient reports having difficulty standing for long periods. HPI Description: 70 year old  gentleman who was referred to [place] by his PCP Dr. Fulton Reek for decubitus ulcers in the region of the sacrum which are very painful and these had them for about 4 months now. The patient's past medical history includes aortic stenosis, atrial fibrillation, coronary artery disease, CHF, COPD, coronary artery disease, prediabetes, peripheral vascular disease, nicotine addiction. In the past he's had carotid artery angioplasty and coronary artery bypass graft and VSD repair. He now has a chronic hip condition which is causing him a lot of pain and he goes to a pain clinic for a long while for this. He's been a smoker for over 40 years and smokes about a pack of cigarettes a day. His last hemoglobin A1c done in February of this year was 5.7. 10/06/2015 -- he continues to have a lot of pain around his decubitus ulcers and other than that has been trying his best to offload. He is also working on his smoking cessation. Electronic Signature(s) Signed: 10/27/2015 1:37:38 PM By: Christin Fudge MD, FACS Entered By: Christin Fudge on 10/27/2015 13:37:38 [name] ([id number]) -------------------------------------------------------------------------------- Physical Exam Details Patient Name: [name], [name] Date of Service: 10/27/2015 12:45 PM Medical Record Number: [id number] Patient Account Number: [id number] Date of Birth/Sex: 08/05/1945 (70 y.o. Male) Treating RN: Ahmed Prima Primary Care Physician: Fulton Reek Other Clinician: Referring Physician: Fulton Reek Treating Physician/Extender: Frann Rider in Treatment: 4 Constitutional . Pulse regular. Respirations normal and unlabored. Afebrile. . Eyes Nonicteric. Reactive to light. Ears, Nose, Mouth, and Throat Lips, teeth, and gums WNL.Marland Kitchen Moist mucosa without lesions. Neck supple and nontender. No palpable supraclavicular or cervical adenopathy. Normal sized without goiter. Respiratory WNL. No  retractions.. Cardiovascular Pedal Pulses WNL. No clubbing, cyanosis or edema. Lymphatic No adneopathy. No adenopathy. No adenopathy. Musculoskeletal Adexa without tenderness or enlargement.. Digits and nails w/o clubbing, cyanosis, infection, petechiae, ischemia, or inflammatory conditions.. Integumentary (Hair, Skin) No suspicious lesions. No crepitus or fluctuance. No peri-wound warmth or erythema. No masses.Marland Kitchen Psychiatric Judgement  and insight Intact.. No evidence of depression, anxiety, or agitation.. Notes the wounds are looking very good today and several of them have completely healed. No sharp debridement was required today. Electronic Signature(s) Signed: 10/27/2015 1:38:30 PM By: Christin Fudge MD, FACS Entered By: Christin Fudge on 10/27/2015 13:38:30 [name] ([id number]) -------------------------------------------------------------------------------- Physician Orders Details Patient Name: [name], [name] Date of Service: 10/27/2015 12:45 PM Medical Record Number: [id number] Patient Account Number: [id number] Date of Birth/Sex: 1946/03/27 (70 y.o. Male) Treating RN: Ahmed Prima Primary Care Physician: Fulton Reek Other Clinician: Referring Physician: Fulton Reek Treating Physician/Extender: Frann Rider in Treatment: 4 Verbal / Phone Orders: Yes Clinician: Carolyne Fiscal, Debi Read Back and Verified: Yes Diagnosis Coding Wound Cleansing Wound #2 Sacrum o Clean wound with Normal Saline. o Cleanse wound with mild soap and water o May Shower, gently pat wound dry prior to applying new dressing. Wound #3 Right,Distal Gluteus o Clean wound with Normal Saline. o Cleanse wound with mild soap and water o May Shower, gently pat wound dry prior to applying new dressing. Skin Barriers/Peri-Wound Care Wound #2 Sacrum o Skin Prep Wound #3 Right,Distal Gluteus o Skin Prep Primary Wound Dressing Wound #2 Sacrum o Santyl  Ointment Wound #3 Right,Distal Gluteus o Santyl Ointment Secondary Dressing Wound #2 Sacrum o Dry Gauze o Boardered Foam Dressing Wound #3 Right,Distal Gluteus o Dry Gauze o Boardered Foam Dressing Dressing Change Frequency Wound #2 Sacrum [name], [name] ([id number]) o Change dressing every day. Wound #3 Right,Distal Gluteus o Change dressing every day. Follow-up Appointments Wound #2 Sacrum o Return Appointment in 1 week. Wound #3 Right,Distal Gluteus o Return Appointment in 1 week. Off-Loading Wound #2 Sacrum o Turn and reposition every 2 hours Wound #3 Right,Distal Gluteus o Turn and reposition every 2 hours Additional Orders / Instructions Wound #2 Sacrum o Stop Smoking o Increase protein intake. Wound #3 Right,Distal Gluteus o Stop Smoking o Increase protein intake. Medications-please add to medication list. Wound #2 Sacrum o Santyl Enzymatic Ointment - continue o Other: - Vitamin C, Zinc, Multivitamins Wound #3 Right,Distal Gluteus o Santyl Enzymatic Ointment - continue o Other: - Vitamin C, Zinc, Multivitamins Electronic Signature(s) Signed: 10/27/2015 4:11:02 PM By: Christin Fudge MD, FACS Signed: 10/27/2015 4:51:59 PM By: Alric Quan Entered By: Alric Quan on 10/27/2015 13:12:13 [name] ([id number]) -------------------------------------------------------------------------------- Prescription 10/27/2015 Patient Name: [name], [name] Physician: Christin Fudge MD Date of Birth: 1945-07-24 NPI#: [id number] Sex: Valeta Harms: [id number] Phone #: [phone] License #: Patient Address: [place] [address] [place], [place] [address] [facility] [address] [place], [place] [address] [phone] Allergies Celebrex Reaction: chest pain Severity: Severe penicillin Reaction: unknown (childhood) Severity: Severe Physician's  Orders Santyl Enzymatic Ointment - continue Signature(s): Date(s): Electronic Signature(s) Signed: 10/27/2015 4:11:02 PM By: Christin Fudge MD, FACS Signed: 10/27/2015 4:51:59 PM By: Alric Quan Entered By: Alric Quan on 10/27/2015 13:12:13 [name] ([id number]) [name], Japheth B. ([id number]) --------------------------------------------------------------------------------  Problem List Details Patient Name: [name], [name] Date of Service: 10/27/2015 12:45 PM Medical Record Number: [id number] Patient Account Number: [id number] Date of Birth/Sex: 1945-07-18 (70 y.o. Male) Treating RN: Ahmed Prima Primary Care Physician: Fulton Reek Other Clinician: Referring Physician: Fulton Reek Treating Physician/Extender: Frann Rider in Treatment: 4 Active Problems ICD-10 Encounter Code Description Active Date Diagnosis L89.153 Pressure ulcer of sacral region, stage 3 09/29/2015 Yes F17.218 Nicotine dependence, cigarettes, with other nicotine- 09/29/2015 Yes induced disorders G89.4 Chronic pain syndrome 09/29/2015 Yes L89.313 Pressure  ulcer of right buttock, stage 3 10/20/2015 Yes Inactive Problems Resolved Problems Electronic Signature(s) Signed: 10/27/2015 1:37:28 PM By: Christin Fudge MD, FACS Entered By: Christin Fudge on 10/27/2015 13:37:28 [name] ([id number]) -------------------------------------------------------------------------------- Progress Note Details Patient Name: [name], [name] Date of Service: 10/27/2015 12:45 PM Medical Record Number: [id number] Patient Account Number: [id number] Date of Birth/Sex: 08/15/1945 (70 y.o. Male) Treating RN: Ahmed Prima Primary Care Physician: Fulton Reek Other Clinician: Referring Physician: Fulton Reek Treating Physician/Extender: Frann Rider in Treatment: 4 Subjective Chief Complaint Information obtained from Patient Patient presents to the wound care center for a  consult due non healing wound for about 4 months now History of Present Illness (HPI) The following HPI elements were documented for the patient's wound: Location: ulcerated areas in the region of the sacrum bilaterally Quality: Patient reports experiencing a shooting pain to affected area(s). Severity: Patient states wound are getting worse. Duration: Patient has had the wound for > 3 months prior to seeking treatment at the wound center Timing: Pain in wound is constant (hurts all the time) Context: The wound would happen gradually Modifying Factors: Other treatment(s) tried include:has been treated by his PCP and has already been on chronic pain medications Associated Signs and Symptoms: Patient reports having difficulty standing for long periods. 70 year old gentleman who was referred to [place] by his PCP Dr. Fulton Reek for decubitus ulcers in the region of the sacrum which are very painful and these had them for about 4 months now. The patient's past medical history includes aortic stenosis, atrial fibrillation, coronary artery disease, CHF, COPD, coronary artery disease, prediabetes, peripheral vascular disease, nicotine addiction. In the past he's had carotid artery angioplasty and coronary artery bypass graft and VSD repair. He now has a chronic hip condition which is causing him a lot of pain and he goes to a pain clinic for a long while for this. He's been a smoker for over 40 years and smokes about a pack of cigarettes a day. His last hemoglobin A1c done in February of this year was 5.7. 10/06/2015 -- he continues to have a lot of pain around his decubitus ulcers and other than that has been trying his best to offload. He is also working on his smoking cessation. Objective Constitutional Pulse regular. Respirations normal and unlabored. Afebrile. Vitals Time Taken: 12:53 PM, Height: 68 in, Weight: 175 lbs, BMI: 26.6, Temperature: 98.5 F, Pulse: 72 [name], [name]  ([id number]) bpm, Respiratory Rate: 18 breaths/min, Blood Pressure: 111/52 mmHg. Eyes Nonicteric. Reactive to light. Ears, Nose, Mouth, and Throat Lips, teeth, and gums WNL.Marland Kitchen Moist mucosa without lesions. Neck supple and nontender. No palpable supraclavicular or cervical adenopathy. Normal sized without goiter. Respiratory WNL. No retractions.. Cardiovascular Pedal Pulses WNL. No clubbing, cyanosis or edema. Lymphatic No adneopathy. No adenopathy. No adenopathy. Musculoskeletal Adexa without tenderness or enlargement.. Digits and nails w/o clubbing, cyanosis, infection, petechiae, ischemia, or inflammatory conditions.Marland Kitchen Psychiatric Judgement and insight Intact.. No evidence of depression, anxiety, or agitation.. General Notes: the wounds are looking very good today and several of them have completely healed. No sharp debridement was required today. Integumentary (Hair, Skin) No suspicious lesions. No crepitus or fluctuance. No peri-wound warmth or erythema. No masses.. Wound #2 status is Open. Original cause of wound was Gradually Appeared. The wound is located on the Sacrum. The wound measures 1.5cm length x 1cm width x 0.1cm depth; 1.178cm^2 area and 0.118cm^3 volume. The wound is limited to skin breakdown. There is no tunneling or undermining noted. There is  a large amount of serous drainage noted. The wound margin is flat and intact. There is medium (34-66%) pink granulation within the wound bed. There is a medium (34-66%) amount of necrotic tissue within the wound bed including Adherent Slough. The periwound skin appearance exhibited: Moist, Erythema. The surrounding wound skin color is noted with erythema which is circumferential. Periwound temperature was noted as No Abnormality. The periwound has tenderness on palpation. Wound #3 status is Open. Original cause of wound was Pressure Injury. The wound is located on the Right,Distal Gluteus. The wound measures 4cm length x 0.3cm  width x 0.1cm depth; 0.942cm^2 area and 0.094cm^3 volume. The wound is limited to skin breakdown. There is no tunneling or undermining noted. There is a medium amount of serous drainage noted. The wound margin is flat and intact. There is small (1-33%) pink granulation within the wound bed. There is a large (67-100%) amount of necrotic tissue within the wound bed including Adherent Slough. The periwound skin appearance exhibited: Moist. The periwound skin appearance did not exhibit: Callus, Crepitus, Excoriation, Fluctuance, Friable, Induration, Localized [name], [name] ([id number]) Edema, Rash, Scarring, Dry/Scaly, Maceration, Atrophie Blanche, Cyanosis, Ecchymosis, Hemosiderin Staining, Mottled, Pallor, Rubor, Erythema. Assessment Active Problems ICD-10 L89.153 - Pressure ulcer of sacral region, stage 3 F17.218 - Nicotine dependence, cigarettes, with other nicotine-induced disorders G89.4 - Chronic pain syndrome L89.313 - Pressure ulcer of right buttock, stage 3 Plan Wound Cleansing: Wound #2 Sacrum: Clean wound with Normal Saline. Cleanse wound with mild soap and water May Shower, gently pat wound dry prior to applying new dressing. Wound #3 Right,Distal Gluteus: Clean wound with Normal Saline. Cleanse wound with mild soap and water May Shower, gently pat wound dry prior to applying new dressing. Skin Barriers/Peri-Wound Care: Wound #2 Sacrum: Skin Prep Wound #3 Right,Distal Gluteus: Skin Prep Primary Wound Dressing: Wound #2 Sacrum: Santyl Ointment Wound #3 Right,Distal Gluteus: Santyl Ointment Secondary Dressing: Wound #2 Sacrum: Dry Gauze Boardered Foam Dressing Wound #3 Right,Distal Gluteus: Dry Gauze Boardered Foam Dressing Dressing Change FrequencyKairo [name], [name] ([id number]) Wound #2 Sacrum: Change dressing every day. Wound #3 Right,Distal Gluteus: Change dressing every day. Follow-up Appointments: Wound #2 Sacrum: Return Appointment in 1  week. Wound #3 Right,Distal Gluteus: Return Appointment in 1 week. Off-Loading: Wound #2 Sacrum: Turn and reposition every 2 hours Wound #3 Right,Distal Gluteus: Turn and reposition every 2 hours Additional Orders / Instructions: Wound #2 Sacrum: Stop Smoking Increase protein intake. Wound #3 Right,Distal Gluteus: Stop Smoking Increase protein intake. Medications-please add to medication list.: Wound #2 Sacrum: Santyl Enzymatic Ointment - continue Other: - Vitamin C, Zinc, Multivitamins Wound #3 Right,Distal Gluteus: Santyl Enzymatic Ointment - continue Other: - Vitamin C, Zinc, Multivitamins I have recommended: 1. Offloading and details of this have been discussed with him at great lengths 2. Santyl ointment locally and protecting it with a bordered foam dressing. the new wound would require a separate bordered foam as it is closer to the buttock and anus 3. We will also give him some 4% lidocaine cream to apply before his dressing changes 4. Adequate protein nutrition and vitamin C, zinc supplements. he thinks vitamin A is causing him swelling of the legs and so years. This. 5. we have had a discussion about the need to completely give up smoking. he is working on it and says he's cut down significantly. he and his wife have had all questions answered and the patient says she's been to be compliant [name], [name] ([id number]) Electronic Signature(s) Signed: 10/27/2015 1:41:30 PM  By: Christin Fudge MD, FACS Entered By: Christin Fudge on 10/27/2015 13:41:30 [name] ([id number]) -------------------------------------------------------------------------------- SuperBill Details Patient Name: [name], Chozen B. Date of Service: 10/27/2015 Medical Record Number: [id number] Patient Account Number: [id number] Date of Birth/Sex: [birth year] (70 y.o. Male) Treating RN: Ahmed Prima Primary Care Physician: Fulton Reek Other Clinician: Referring Physician: Fulton Reek Treating Physician/Extender: Frann Rider in Treatment: 4 Diagnosis Coding ICD-10 Codes Code Description [id number] Pressure ulcer of sacral region, stage 3 F17.218 Nicotine dependence, cigarettes, with other nicotine-induced disorders G89.4 Chronic pain syndrome L89.313 Pressure ulcer of right buttock, stage 3 Physician Procedures CPT4 Code Description: [id number] - WC PHYS LEVEL 3 - EST PT ICD-10 Description Diagnosis L89.153 Pressure ulcer of sacral region, stage 3 F17.218 Nicotine dependence, cigarettes, with other nicotin G89.4 Chronic pain syndrome L89.313 Pressure ulcer  of right buttock, stage 3 Modifier: e-induced dis Quantity: 1 orders Electronic Signature(s) Signed: 10/27/2015 1:41:56 PM By: Christin Fudge MD, FACS Entered By: Christin Fudge on 10/27/2015 13:41:56

## 2015-10-27 NOTE — Progress Notes (Signed)
[name] ([id number]) Visit Report for 10/27/2015 Arrival Information Details Patient Name: [name], [name] Date of Service: 10/27/2015 12:45 PM Medical Record Number: [id number] Patient Account Number: [id number] Date of Birth/Sex: 1945/12/05 (70 y.o. Male) Treating RN: Ahmed Prima Primary Care Physician: Fulton Reek Other Clinician: Referring Physician: Fulton Reek Treating Physician/Extender: Frann Rider in Treatment: 4 Visit Information History Since Last Visit All ordered tests and consults were completed: No Patient Arrived: Ambulatory Added or deleted any medications: No Arrival Time: 12:52 Any new allergies or adverse reactions: No Accompanied By: wife Had a fall or experienced change in No Transfer Assistance: None activities of daily living that may affect Patient Identification Verified: Yes risk of falls: Secondary Verification Process Yes Signs or symptoms of abuse/neglect since last No Completed: visito Patient Requires Transmission-Based No Hospitalized since last visit: No Precautions: Pain Present Now: No Patient Has Alerts: No Electronic Signature(s) Signed: 10/27/2015 4:51:59 PM By: Alric Quan Entered By: Alric Quan on 10/27/2015 12:53:13 [name] ([id number]) -------------------------------------------------------------------------------- Encounter Discharge Information Details Patient Name: [name] Ada, [name] Date of Service: 10/27/2015 12:45 PM Medical Record Number: [id number] Patient Account Number: [id number] Date of Birth/Sex: 23-Sep-1945 (70 y.o. Male) Treating RN: Ahmed Prima Primary Care Physician: Fulton Reek Other Clinician: Referring Physician: Fulton Reek Treating Physician/Extender: Frann Rider in Treatment: 4 Encounter Discharge Information Items Discharge Pain Level: 0 Discharge Condition: Stable Ambulatory Status: Ambulatory Discharge Destination:  Home Transportation: Private Auto Accompanied By: wife Schedule Follow-up Appointment: Yes Medication Reconciliation completed and provided to Patient/Care Yes [name]: Provided on Clinical Summary of Care: 10/27/2015 Form Type Recipient Paper Patient PB Electronic Signature(s) Signed: 10/27/2015 1:27:08 PM By: Ruthine Dose Entered By: Ruthine Dose on 10/27/2015 13:27:07 [name] ([id number]) -------------------------------------------------------------------------------- Lower Extremity Assessment Details Patient Name: [name] Ada, [name] Date of Service: 10/27/2015 12:45 PM Medical Record Number: [id number] Patient Account Number: [id number] Date of Birth/Sex: 09-15-1945 (70 y.o. Male) Treating RN: Ahmed Prima Primary Care Physician: Fulton Reek Other Clinician: Referring Physician: Fulton Reek Treating Physician/Extender: Frann Rider in Treatment: 4 Electronic Signature(s) Signed: 10/27/2015 4:51:59 PM By: Alric Quan Entered By: Alric Quan on 10/27/2015 12:55:55 [name] ([id number]) -------------------------------------------------------------------------------- Multi Wound Chart Details Patient Name: [name] Ada, [name] Date of Service: 10/27/2015 12:45 PM Medical Record Number: [id number] Patient Account Number: [id number] Date of Birth/Sex: May 10, 1945 (70 y.o. Male) Treating RN: Ahmed Prima Primary Care Physician: Fulton Reek Other Clinician: Referring Physician: Fulton Reek Treating Physician/Extender: Frann Rider in Treatment: 4 Vital Signs Height(in): 68 Pulse(bpm): 72 Weight(lbs): 175 Blood Pressure 111/52 (mmHg): Body Mass Index(BMI): 27 Temperature(F): 98.5 Respiratory Rate 18 (breaths/min): Photos: [2:No Photos] [3:No Photos] [N/A:N/A] Wound Location: [2:Sacrum] [3:Right Gluteus - Distal] [N/A:N/A] Wounding Event: [2:Gradually Appeared] [3:Pressure Injury] [N/A:N/A] Primary  Etiology: [2:Pressure Ulcer] [3:Pressure Ulcer] [N/A:N/A] Comorbid History: [2:Chronic Obstructive Pulmonary Disease (COPD), Arrhythmia, Congestive Heart Failure, Congestive Heart Failure, Coronary Artery Disease, Coronary Artery Disease, Hypertension, Peripheral Hypertension, Peripheral Venous Disease,  Osteoarthritis] [3:Chronic Obstructive Pulmonary Disease (COPD), Arrhythmia, Venous Disease, Osteoarthritis] [N/A:N/A] Date Acquired: [2:06/01/2015] [3:10/06/2015] [N/A:N/A] Weeks of Treatment: [2:4] [3:1] [N/A:N/A] Wound Status: [2:Open] [3:Open] [N/A:N/A] Measurements L x W x D 4.7x2.4x0.1 [3:0.7x0.3x0.1] [N/A:N/A] (cm) Area (cm) : [2:8.859] [3:0.165] [N/A:N/A] Volume (cm) : [2:0.886] [3:0.016] [N/A:N/A] % Reduction in Area: [2:43.00%] [3:41.70%] [N/A:N/A] % Reduction in Volume: 43.00% [3:42.90%] [N/A:N/A] Classification: [2:Category/Stage II] [3:Category/Stage II] [N/A:N/A] Exudate Amount: [2:Large] [3:Medium] [N/A:N/A] Exudate Type: [2:Serous] [3:Serous] [N/A:N/A] Exudate Color: [2:amber] [3:amber] [N/A:N/A] Wound Margin: [2:Flat and Intact] [3:Flat and Intact] [N/A:N/A] Granulation Amount: [2:Medium (34-66%)] [  3:Small (1-33%)] [N/A:N/A] Granulation Quality: [2:Pink] [3:Pink] [N/A:N/A] Necrotic Amount: [2:Medium (34-66%)] [3:Large (67-100%)] [N/A:N/A] Exposed Structures: [N/A:N/A] Fascia: No Fascia: No Fat: No Fat: No Tendon: No Tendon: No Muscle: No Muscle: No Joint: No Joint: No Bone: No Bone: No Limited to Skin Limited to Skin Breakdown Breakdown Epithelialization: Small (1-33%) None N/A Periwound Skin Texture: No Abnormalities Noted Edema: No N/A Excoriation: No Induration: No Callus: No Crepitus: No Fluctuance: No Friable: No Rash: No Scarring: No Periwound Skin Moist: Yes Moist: Yes N/A Moisture: Maceration: No Dry/Scaly: No Periwound Skin Color: Erythema: Yes Atrophie Blanche: No N/A Cyanosis: No Ecchymosis: No Erythema: No Hemosiderin Staining:  No Mottled: No Pallor: No Rubor: No Erythema Location: Circumferential N/A N/A Temperature: No Abnormality N/A N/A Tenderness on Yes No N/A Palpation: Wound Preparation: Ulcer Cleansing: Ulcer Cleansing: N/A Rinsed/Irrigated with Rinsed/Irrigated with Saline Saline Topical Anesthetic Topical Anesthetic Applied: Other: lidocaine Applied: Other: lidociane 4% 4% Treatment Notes Electronic Signature(s) Signed: 10/27/2015 4:51:59 PM By: Alric Quan Entered By: Alric Quan on 10/27/2015 13:06:10 [name] ([id number]) -------------------------------------------------------------------------------- [place] Plan Details Patient Name: [name] Ada, [name] Date of Service: 10/27/2015 12:45 PM Medical Record Number: [id number] Patient Account Number: [id number] Date of Birth/Sex: 04/12/1946 (70 y.o. Male) Treating RN: Ahmed Prima Primary Care Physician: Fulton Reek Other Clinician: Referring Physician: Fulton Reek Treating Physician/Extender: Frann Rider in Treatment: 4 Active Inactive Orientation to the Wound Care Program Nursing Diagnoses: Knowledge deficit related to the wound healing center program Goals: Patient/caregiver will verbalize understanding of the [facility] Date Initiated: 10/20/2015 Goal Status: Active Interventions: Provide education on orientation to the wound center Notes: Pressure Nursing Diagnoses: Knowledge deficit related to management of pressures ulcers Goals: Patient will remain free from development of additional pressure ulcers Date Initiated: 10/20/2015 Goal Status: Active Interventions: Assess: immobility, friction, shearing, incontinence upon admission and as needed Notes: Electronic Signature(s) Signed: 10/27/2015 4:51:59 PM By: Alric Quan Entered By: Alric Quan on 10/27/2015 [place], [place].  ([id number]) -------------------------------------------------------------------------------- Pain Assessment Details Patient Name: [name] Ada, [name] Date of Service: 10/27/2015 12:45 PM Medical Record Number: [id number] Patient Account Number: [id number] Date of Birth/Sex: June 01, 1945 (70 y.o. Male) Treating RN: Ahmed Prima Primary Care Physician: Fulton Reek Other Clinician: Referring Physician: Fulton Reek Treating Physician/Extender: Frann Rider in Treatment: 4 Active Problems Location of Pain Severity and Description of Pain Patient Has Paino No Site Locations With Dressing Change: No Pain Management and Medication Current Pain Management: Electronic Signature(s) Signed: 10/27/2015 4:51:59 PM By: Alric Quan Entered By: Alric Quan on 10/27/2015 12:53:19 [name] ([id number]) -------------------------------------------------------------------------------- Patient/Caregiver Education Details Patient Name: [name] Ada, Slayton B. Date of Service: 10/27/2015 12:45 PM Medical Record Number: [id number] Patient Account Number: [id number] Date of Birth/Gender: 10/02/1945 (70 y.o. Male) Treating RN: Ahmed Prima Primary Care Physician: Fulton Reek Other Clinician: Referring Physician: Fulton Reek Treating Physician/Extender: Frann Rider in Treatment: 4 Education Assessment Education Provided To: Patient Education Topics Provided Wound/Skin Impairment: Handouts: Other: change dressing as ordered Methods: Demonstration, Explain/Verbal Responses: State content correctly Electronic Signature(s) Signed: 10/27/2015 4:51:59 PM By: Alric Quan Entered By: Alric Quan on 10/27/2015 13:13:21 [name] ([id number]) -------------------------------------------------------------------------------- Wound Assessment Details Patient Name: [name] Ada, [name] Date of Service: 10/27/2015 12:45 PM Medical Record Number:  [id number] Patient Account Number: [id number] Date of Birth/Sex: Apr 14, 1946 (70 y.o. Male) Treating RN: Ahmed Prima Primary Care Physician: Fulton Reek Other Clinician: Referring Physician: Fulton Reek Treating Physician/Extender: Frann Rider in Treatment: 4 Wound Status Wound Number: 2 Primary Pressure Ulcer  Etiology: Wound Location: Sacrum Wound Open Wounding Event: Gradually Appeared Status: Date Acquired: 06/01/2015 Comorbid Chronic Obstructive Pulmonary Disease Weeks Of Treatment: 4 History: (COPD), Arrhythmia, Congestive Heart Clustered Wound: No Failure, Coronary Artery Disease, Hypertension, Peripheral Venous Disease, Osteoarthritis Photos Photo Uploaded By: Alric Quan on 10/27/2015 16:26:13 Wound Measurements Length: (cm) 1.5 Width: (cm) 1 Depth: (cm) 0.1 Area: (cm) 1.178 Volume: (cm) 0.118 % Reduction in Area: 92.4% % Reduction in Volume: 92.4% Epithelialization: Small (1-33%) Tunneling: No Undermining: No Wound Description Classification: Category/Stage II Wound Margin: Flat and Intact Exudate Amount: Large Exudate Type: Serous Exudate Color: amber Foul Odor After Cleansing: No Wound Bed Granulation Amount: Medium (34-66%) Exposed Structure Granulation Quality: Pink Fascia Exposed: No Bitting, Ketih B. ([id number]) Necrotic Amount: Medium (34-66%) Fat Layer Exposed: No Necrotic Quality: Adherent Slough Tendon Exposed: No Muscle Exposed: No Joint Exposed: No Bone Exposed: No Limited to Skin Breakdown Periwound Skin Texture Texture Color No Abnormalities Noted: No No Abnormalities Noted: No Erythema: Yes Moisture Erythema Location: Circumferential No Abnormalities Noted: No Moist: Yes Temperature / Pain Temperature: No Abnormality Tenderness on Palpation: Yes Wound Preparation Ulcer Cleansing: Rinsed/Irrigated with Saline Topical Anesthetic Applied: Other: lidocaine 4%, Treatment Notes Wound #2 (Sacrum) 1.  Cleansed with: Clean wound with Normal Saline 2. Anesthetic Topical Lidocaine 4% cream to wound bed prior to debridement 3. Peri-wound Care: Barrier cream Skin Prep 4. Dressing Applied: Santyl Ointment 5. Secondary Dressing Applied Bordered Foam Dressing Electronic Signature(s) Signed: 10/27/2015 4:51:59 PM By: Alric Quan Entered By: Alric Quan on 10/27/2015 13:16:44 [name] ([id number]) -------------------------------------------------------------------------------- Wound Assessment Details Patient Name: [name] Ada, [name] Date of Service: 10/27/2015 12:45 PM Medical Record Number: [id number] Patient Account Number: [id number] Date of Birth/Sex: 07-15-1945 (70 y.o. Male) Treating RN: Ahmed Prima Primary Care Physician: Fulton Reek Other Clinician: Referring Physician: Fulton Reek Treating Physician/Extender: Frann Rider in Treatment: 4 Wound Status Wound Number: 3 Primary Pressure Ulcer Etiology: Wound Location: Right, Distal Gluteus Wound Open Wounding Event: Pressure Injury Status: Date Acquired: 10/06/2015 Comorbid Chronic Obstructive Pulmonary Disease Weeks Of Treatment: 1 History: (COPD), Arrhythmia, Congestive Heart Clustered Wound: No Failure, Coronary Artery Disease, Hypertension, Peripheral Venous Disease, Osteoarthritis Photos Photo Uploaded By: Alric Quan on 10/27/2015 16:26:13 Wound Measurements Length: (cm) 4 Width: (cm) 0.3 Depth: (cm) 0.1 Area: (cm) 0.942 Volume: (cm) 0.094 % Reduction in Area: -232.9% % Reduction in Volume: -235.7% Epithelialization: None Tunneling: No Undermining: No Wound Description Classification: Category/Stage II Wound Margin: Flat and Intact Exudate Amount: Medium Exudate Type: Serous Exudate Color: amber Wound Bed Granulation Amount: Small (1-33%) Exposed Structure Granulation Quality: Pink Fascia Exposed: No Belk, [name] ([id number]) Necrotic Amount: Large  (67-100%) Fat Layer Exposed: No Necrotic Quality: Adherent Slough Tendon Exposed: No Muscle Exposed: No Joint Exposed: No Bone Exposed: No Limited to Skin Breakdown Periwound Skin Texture Texture Color No Abnormalities Noted: No No Abnormalities Noted: No Callus: No Atrophie Blanche: No Crepitus: No Cyanosis: No Excoriation: No Ecchymosis: No Fluctuance: No Erythema: No Friable: No Hemosiderin Staining: No Induration: No Mottled: No Localized Edema: No Pallor: No Rash: No Rubor: No Scarring: No Moisture No Abnormalities Noted: No Dry / Scaly: No Maceration: No Moist: Yes Wound Preparation Ulcer Cleansing: Rinsed/Irrigated with Saline Topical Anesthetic Applied: Other: lidociane 4%, Treatment Notes Wound #3 (Right, Distal Gluteus) 1. Cleansed with: Clean wound with Normal Saline 2. Anesthetic Topical Lidocaine 4% cream to wound bed prior to debridement 3. Peri-wound Care: Barrier cream Skin Prep 4. Dressing Applied: Santyl Ointment 5. Secondary Dressing Applied Bordered Foam Dressing Electronic  Signature(s) Signed: 10/27/2015 4:51:59 PM By: Alric Quan Entered By: Alric Quan on 10/27/2015 13:16:34 [name] ([id number]) [name] Ada, [name] ([id number]) -------------------------------------------------------------------------------- Vitals Details Patient Name: [name] Ada, [name] Date of Service: 10/27/2015 12:45 PM Medical Record Number: [id number] Patient Account Number: [id number] Date of Birth/Sex: [birth year] (70 y.o. Male) Treating RN: Ahmed Prima Primary Care Physician: Fulton Reek Other Clinician: Referring Physician: Fulton Reek Treating Physician/Extender: Frann Rider in Treatment: 4 Vital Signs Time Taken: 12:53 Temperature (F): 98.5 Height (in): 68 Pulse (bpm): 72 Weight (lbs): 175 Respiratory Rate (breaths/min): 18 Body Mass Index (BMI): 26.6 Blood Pressure (mmHg): 111/52 Reference Range: 80 -  120 mg / dl Electronic Signature(s) Signed: 10/27/2015 4:51:59 PM By: Alric Quan Entered By: Alric Quan on 10/27/2015 12:55:07

## 2015-11-03 ENCOUNTER — Encounter (HOSPITAL_BASED_OUTPATIENT_CLINIC_OR_DEPARTMENT_OTHER): Payer: Medicare Other | Admitting: General Surgery

## 2015-11-03 DIAGNOSIS — L89153 Pressure ulcer of sacral region, stage 3: Secondary | ICD-10-CM | POA: Diagnosis not present

## 2015-11-03 DIAGNOSIS — L89101 Pressure ulcer of unspecified part of back, stage 1: Secondary | ICD-10-CM

## 2015-11-03 NOTE — Progress Notes (Signed)
Ulcers almost healed.  Continue to off load

## 2015-11-04 NOTE — Progress Notes (Addendum)
[name] ([id number]) Visit Report for 11/03/2015 Chief Complaint Document Details Patient Name: [name], [name] Date of Service: 11/03/2015 1:30 PM Medical Record Number: [id number] Patient Account Number: [id number] Date of Birth/Sex: [birth year] (70 y.o. Male) Treating RN: [name] Primary Care Physician: [name] Other Clinician: Referring Physician: Fulton [name] Treating Physician/Extender: [name] in Treatment: 5 Information Obtained from: Patient Chief Complaint Patient presents to the wound care center for a consult due non healing wound for about 4 months now Electronic Signature(s) Signed: 11/03/2015 2:07:21 PM By: [name] Companion MD Entered By: [name] on 11/03/2015 14:07:20 [name] ([id number]) -------------------------------------------------------------------------------- HPI Details Patient Name: [name], [name] Date of Service: 11/03/2015 1:30 PM Medical Record Number: [id number] Patient Account Number: [id number] Date of Birth/Sex: 09-Jan-1946 (69 y.o. Male) Treating RN: [name] Primary Care Physician: [name] Other Clinician: Referring Physician: Fulton [name] Treating Physician/Extender: [name] in Treatment: 5 History of Present Illness Location: ulcerated areas in the region of the sacrum bilaterally Quality: Patient reports experiencing a shooting pain to affected area(s). Severity: Patient states wound are getting worse. Duration: Patient has had the wound for > 3 months prior to seeking treatment at the wound center Timing: Pain in wound is constant (hurts all the time) Context: The wound would happen gradually Modifying Factors: Other treatment(s) tried include:has been treated by his PCP and has already been on chronic pain medications Associated Signs and Symptoms: Patient reports having difficulty standing for long periods. HPI Description: 70 year old gentleman who  was referred to [place] by his PCP Dr. Fulton [name] for decubitus ulcers in the region of the sacrum which are very painful and these had them for about 4 months now. The patient's past medical history includes aortic stenosis, atrial fibrillation, coronary artery disease, CHF, COPD, coronary artery disease, prediabetes, peripheral vascular disease, nicotine addiction. In the past he's had carotid artery angioplasty and coronary artery bypass graft and VSD repair. He now has a chronic hip condition which is causing him a lot of pain and he goes to a pain clinic for a long while for this. He's been a smoker for over 40 years and smokes about a pack of cigarettes a day. His last hemoglobin A1c done in February of this year was 5.7. 10/06/2015 -- he continues to have a lot of pain around his decubitus ulcers and other than that has been trying his best to offload. He is also working on his smoking cessation. Electronic Signature(s) Signed: 11/03/2015 2:07:31 PM By: [name] Companion MD Entered By: [name] on 11/03/2015 14:07:31 [name] ([id number]) -------------------------------------------------------------------------------- Physical Exam Details Patient Name: [name], [name] Date of Service: 11/03/2015 1:30 PM Medical Record Number: [id number] Patient Account Number: [id number] Date of Birth/Sex: [birth year] (70 y.o. Male) Treating RN: [name] Primary Care Physician: [name] Other Clinician: Referring Physician: Fulton [name] Treating Physician/Extender: [name] in Treatment: 5 Electronic Signature(s) Signed: 11/03/2015 2:07:38 PM By: [name] Companion MD Entered By: [name] on 11/03/2015 14:07:38 [name] ([id number]) -------------------------------------------------------------------------------- Physician Orders Details Patient Name: [name], [name] Date of Service: 11/03/2015 1:30 PM Medical Record Number: [id number] Patient  Account Number: [id number] Date of Birth/Sex: [birth year] (70 y.o. Male) Treating RN: [name] Primary Care Physician: [name] Other Clinician: Referring Physician: Fulton [name] Treating Physician/Extender: [name] in Treatment: 5 Verbal / Phone Orders: Yes Clinician: Montey [name] Read Back and Verified: Yes Diagnosis Coding Wound Cleansing Wound #2 Sacrum o Clean wound with Normal Saline. o Cleanse  wound with mild soap and water o May Shower, gently pat wound dry prior to applying new dressing. Skin Barriers/Peri-Wound Care Wound #2 Sacrum o Skin Prep Secondary Dressing Wound #2 Sacrum o Dry Gauze o Boardered Foam Dressing Dressing Change Frequency Wound #2 Sacrum o Change dressing every day. Follow-up Appointments Wound #2 Sacrum o Return Appointment in 1 week. Off-Loading Wound #2 Sacrum o Turn and reposition every 2 hours Additional Orders / Instructions Wound #2 Sacrum o Stop Smoking o Increase protein intake. Medications-please add to medication list. Wound #2 Sacrum o Santyl Enzymatic Ointment - continue [name], [name] ([id number]) o Other: - Vitamin C, Zinc, Multivitamins Electronic Signature(s) Signed: 11/03/2015 5:21:35 PM By: [name] Signed: 11/04/2015 1:16:39 PM By: [name] Companion MD Entered By: [name] on 11/03/2015 13:56:16 [name] ([id number]) -------------------------------------------------------------------------------- Prescription 11/03/2015 Patient Name: [name], [name] Physician: [name] Companion MD Date of Birth: 08-Jul-1945 NPI#: [id number] Sex: M DEA#: Phone #: [phone] License #: Patient Address: [place] [address] [place], [place] [address] [facility] [address] [place], [place] [address] [phone] Allergies Celebrex Reaction: chest pain Severity:  Severe penicillin Reaction: unknown (childhood) Severity: Severe Physician's Orders Santyl Enzymatic Ointment - continue Signature(s): Date(s): Electronic Signature(s) Signed: 11/03/2015 2:09:28 PM By: [name] Companion MD Entered By: [name] on 11/03/2015 14:09:28 [name] ([id number]) --------------------------------------------------------------------------------  Problem List Details Patient Name: [name], [name] Date of Service: 11/03/2015 1:30 PM Medical Record Number: [id number] Patient Account Number: [id number] Date of Birth/Sex: [birth year] (70 y.o. Male) Treating RN: [name] Primary Care Physician: [name] Other Clinician: Referring Physician: Fulton [name] Treating Physician/Extender: [name] in Treatment: 5 Active Problems ICD-10 Encounter Code Description Active Date Diagnosis L89.153 Pressure ulcer of sacral region, stage 3 09/29/2015 Yes F17.218 Nicotine dependence, cigarettes, with other nicotine- 09/29/2015 Yes induced disorders G89.4 Chronic pain syndrome 09/29/2015 Yes L89.313 Pressure ulcer of right buttock, stage 3 10/20/2015 Yes Inactive Problems Resolved Problems Electronic Signature(s) Signed: 11/03/2015 2:07:12 PM By: [name] Companion MD Entered By: [name] on 11/03/2015 14:07:12 [name] ([id number]) -------------------------------------------------------------------------------- Progress Note Details Patient Name: [name], [name] Date of Service: 11/03/2015 1:30 PM Medical Record Number: [id number] Patient Account Number: [id number] Date of Birth/Sex: 25-May-1945 (70 y.o. Male) Treating RN: [name] Primary Care Physician: [name] Other Clinician: Referring Physician: Fulton [name] Treating Physician/Extender: [name] in Treatment: 5 Subjective Chief Complaint Information obtained from Patient Patient presents to the wound care center for a consult due non  healing wound for about 4 months now History of Present Illness (HPI) The following HPI elements were documented for the patient's wound: Location: ulcerated areas in the region of the sacrum bilaterally Quality: Patient reports experiencing a shooting pain to affected area(s). Severity: Patient states wound are getting worse. Duration: Patient has had the wound for > 3 months prior to seeking treatment at the wound center Timing: Pain in wound is constant (hurts all the time) Context: The wound would happen gradually Modifying Factors: Other treatment(s) tried include:has been treated by his PCP and has already been on chronic pain medications Associated Signs and Symptoms: Patient reports having difficulty standing for long periods. 71 year old gentleman who was referred to [place] by his PCP Dr. Fulton [name] for decubitus ulcers in the region of the sacrum which are very painful and these had them for about 4 months now. The patient's past medical history includes aortic stenosis, atrial fibrillation, coronary artery disease, CHF, COPD, coronary  artery disease, prediabetes, peripheral vascular disease, nicotine addiction. In the past he's had carotid artery angioplasty and coronary artery bypass graft and VSD repair. He now has a chronic hip condition which is causing him a lot of pain and he goes to a pain clinic for a long while for this. He's been a smoker for over 40 years and smokes about a pack of cigarettes a day. His last hemoglobin A1c done in February of this year was 5.7. 10/06/2015 -- he continues to have a lot of pain around his decubitus ulcers and other than that has been trying his best to offload. He is also working on his smoking cessation. Objective Constitutional Vitals Time Taken: 1:43 PM, Height: 68 in, Weight: 175 lbs, BMI: 26.6, Temperature: 98.5 F, Pulse: 78 bpm, Respiratory Rate: 18 breaths/min, Blood Pressure: 142/69 mmHg. [name], [name]  ([id number]) Integumentary (Hair, Skin) Wound #2 status is Open. Original cause of wound was Gradually Appeared. The wound is located on the Sacrum. The wound measures 0.6cm length x 0.2cm width x 0.1cm depth; 0.094cm^2 area and 0.009cm^3 volume. The wound is limited to skin breakdown. There is no tunneling or undermining noted. There is a large amount of serous drainage noted. The wound margin is flat and intact. There is medium (34-66%) pink granulation within the wound bed. There is a medium (34-66%) amount of necrotic tissue within the wound bed including Adherent Slough. The periwound skin appearance exhibited: Moist, Erythema. The surrounding wound skin color is noted with erythema which is circumferential. Periwound temperature was noted as No Abnormality. The periwound has tenderness on palpation. Wound #3 status is Healed - Epithelialized. Original cause of wound was Pressure Injury. The wound is located on the Right,Distal Gluteus. The wound measures 0cm length x 0cm width x 0cm depth; 0cm^2 area and 0cm^3 volume. Assessment Active Problems ICD-10 L89.153 - Pressure ulcer of sacral region, stage 3 F17.218 - Nicotine dependence, cigarettes, with other nicotine-induced disorders G89.4 - Chronic pain syndrome L89.313 - Pressure ulcer of right buttock, stage 3 Ulcers 99% healed. Continue prism and off loading Plan Wound Cleansing: Wound #2 Sacrum: Clean wound with Normal Saline. Cleanse wound with mild soap and water May Shower, gently pat wound dry prior to applying new dressing. Skin Barriers/Peri-Wound Care: Wound #2 Sacrum: Skin Prep Secondary Dressing: Wound #2 Sacrum: Dry Gauze Boardered Foam Dressing [name], [name] ([id number]) Dressing Change Frequency: Wound #2 Sacrum: Change dressing every day. Follow-up Appointments: Wound #2 Sacrum: Return Appointment in 1 week. Off-Loading: Wound #2 Sacrum: Turn and reposition every 2 hours Additional Orders /  Instructions: Wound #2 Sacrum: Stop Smoking Increase protein intake. Medications-please add to medication list.: Wound #2 Sacrum: Santyl Enzymatic Ointment - continue Other: - Vitamin C, Zinc, Multivitamins Follow-Up Appointments: A follow-up appointment should be scheduled. Medication Reconciliation completed and provided to Patient/Care Provider. A Patient Clinical Summary of Care was provided to PB Electronic Signature(s) Signed: 11/14/2015 11:01:18 AM By: [name] Companion MD Previous Signature: 11/03/2015 2:09:03 PM Version By: [name] Companion MD Entered By: [name] on 11/14/2015 11:01:18 [name] ([id number]) -------------------------------------------------------------------------------- SuperBill Details Patient Name: [name], [name] Date of Service: 11/03/2015 Medical Record Number: [id number] Patient Account Number: [id number] Date of Birth/Sex: Aug 31, 1945 (70 y.o. Male) Treating RN: [name] Primary Care Physician: [name] Other Clinician: Referring Physician: Fulton [name] Treating Physician/Extender: [name] in Treatment: 5 Diagnosis Coding ICD-10 Codes Code Description [phone] Pressure ulcer of sacral region, stage 3 F17.218 Nicotine dependence, cigarettes, with other nicotine-induced disorders G89.4 Chronic  pain syndrome L89.313 Pressure ulcer of right buttock, stage 3 Facility Procedures CPT4 Code: [id number] Description: [phone] - WOUND CARE VISIT-LEV 2 EST PT Modifier: Quantity: 1 Physician Procedures CPT4 Code: [id number] Description: [phone] - WC PHYS LEVEL 2 - EST PT ICD-10 Description Diagnosis L89.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

## 2015-11-04 NOTE — Progress Notes (Signed)
ZIKORA, DANKERS ([id number]) Visit Report for 11/03/2015 Arrival Information Details Patient Name: [name], [name] Date of Service: 11/03/2015 1:30 PM Medical Record Number: [id number] Patient Account Number: [id number] Date of Birth/Sex: 1946/02/09 (71 y.o. Male) Treating RN: Montey Hora Primary Care Physician: Fulton Reek Other Clinician: Referring Physician: Fulton Reek Treating Physician/Extender: Benjaman Pott in Treatment: 5 Visit Information History Since Last Visit Added or deleted any medications: No Patient Arrived: Ambulatory Any new allergies or adverse reactions: No Arrival Time: 13:41 Had a fall or experienced change in No Accompanied By: spouse activities of daily living that may affect Transfer Assistance: None risk of falls: Patient Identification Verified: Yes Signs or symptoms of abuse/neglect since last No Secondary Verification Process Yes visito Completed: Hospitalized since last visit: No Patient Requires Transmission-Based No Has Dressing in Place as Prescribed: Yes Precautions: Pain Present Now: No Patient Has Alerts: No Electronic Signature(s) Signed: 11/03/2015 5:21:35 PM By: Montey Hora Entered By: Montey Hora on 11/03/2015 13:41:54 [name] ([id number]) -------------------------------------------------------------------------------- Clinic Level of Care Assessment Details Patient Name: [name] Ada, [name] Date of Service: 11/03/2015 1:30 PM Medical Record Number: [id number] Patient Account Number: [id number] Date of Birth/Sex: January 24, 1946 (70 y.o. Male) Treating RN: Montey Hora Primary Care Physician: Fulton Reek Other Clinician: Referring Physician: Fulton Reek Treating Physician/Extender: Benjaman Pott in Treatment: 5 Clinic Level of Care Assessment Items TOOL 4 Quantity Score []  - Use when only an EandM is performed on FOLLOW-UP visit 0 ASSESSMENTS - Nursing Assessment /  Reassessment X - Reassessment of Co-morbidities (includes updates in patient status) 1 10 X - Reassessment of Adherence to Treatment Plan 1 5 ASSESSMENTS - Wound and Skin Assessment / Reassessment X - Simple Wound Assessment / Reassessment - one wound 1 5 []  - Complex Wound Assessment / Reassessment - multiple wounds 0 []  - Dermatologic / Skin Assessment (not related to wound area) 0 ASSESSMENTS - Focused Assessment []  - Circumferential Edema Measurements - multi extremities 0 []  - Nutritional Assessment / Counseling / Intervention 0 []  - Lower Extremity Assessment (monofilament, tuning fork, pulses) 0 []  - Peripheral Arterial Disease Assessment (using hand held doppler) 0 ASSESSMENTS - Ostomy and/or Continence Assessment and Care []  - Incontinence Assessment and Management 0 []  - Ostomy Care Assessment and Management (repouching, etc.) 0 PROCESS - Coordination of Care X - Simple Patient / Family Education for ongoing care 1 15 []  - Complex (extensive) Patient / Family Education for ongoing care 0 []  - Staff obtains Programmer, systems, Records, Test Results / Process Orders 0 []  - Staff telephones HHA, Nursing Homes / Clarify orders / etc 0 []  - Routine Transfer to another Facility (non-emergent condition) 0 [name] ([id number]) []  - Routine Hospital Admission (non-emergent condition) 0 []  - New Admissions / Biomedical engineer / Ordering NPWT, Apligraf, etc. 0 []  - Emergency Hospital Admission (emergent condition) 0 X - Simple Discharge Coordination 1 10 []  - Complex (extensive) Discharge Coordination 0 PROCESS - Special Needs []  - Pediatric / Minor Patient Management 0 []  - Isolation Patient Management 0 []  - Hearing / Language / Visual special needs 0 []  - Assessment of Community assistance (transportation, D/C planning, etc.) 0 []  - Additional assistance / Altered mentation 0 []  - Support Surface(s) Assessment (bed, cushion, seat, etc.) 0 INTERVENTIONS - Wound Cleansing /  Measurement X - Simple Wound Cleansing - one wound 1 5 []  - Complex Wound Cleansing - multiple wounds 0 X - Wound Imaging (photographs - any number of wounds) 1 5 []  - Wound  Tracing (instead of photographs) 0 X - Simple Wound Measurement - one wound 1 5 []  - Complex Wound Measurement - multiple wounds 0 INTERVENTIONS - Wound Dressings X - Small Wound Dressing one or multiple wounds 1 10 []  - Medium Wound Dressing one or multiple wounds 0 []  - Large Wound Dressing one or multiple wounds 0 []  - Application of Medications - topical 0 []  - Application of Medications - injection 0 INTERVENTIONS - Miscellaneous []  - External ear exam 0 [name], [name] ([id number]) []  - Specimen Collection (cultures, biopsies, blood, body fluids, etc.) 0 []  - Specimen(s) / Culture(s) sent or taken to Lab for analysis 0 []  - Patient Transfer (multiple staff / Civil Service fast streamer / Similar devices) 0 []  - Simple Staple / Suture removal (25 or less) 0 []  - Complex Staple / Suture removal (26 or more) 0 []  - Hypo / Hyperglycemic Management (close monitor of Blood Glucose) 0 []  - Ankle / Brachial Index (ABI) - do not check if billed separately 0 X - Vital Signs 1 5 Has the patient been seen at the hospital within the last three years: Yes Total Score: 75 Level Of Care: New/Established - Level 2 Electronic Signature(s) Unsigned Entered By: Montey Hora on 11/04/2015 12:44:38 Signature(s): Date(s): [name] ([id number]) -------------------------------------------------------------------------------- Encounter Discharge Information Details Patient Name: [name], [name] Date of Service: 11/03/2015 1:30 PM Medical Record Number: [id number] Patient Account Number: [id number] Date of Birth/Sex: [birth year] (70 y.o. Male) Treating RN: Montey Hora Primary Care Physician: Fulton Reek Other Clinician: Referring Physician: Fulton Reek Treating Physician/Extender: Benjaman Pott in  Treatment: 5 Encounter Discharge Information Items Discharge Pain Level: 0 Discharge Condition: Stable Ambulatory Status: Ambulatory Discharge Destination: Home Transportation: Private Auto Accompanied By: wife Schedule Follow-up Appointment: Yes Medication Reconciliation completed and provided to Patient/Care Yes Ota Ebersole: Provided on Clinical Summary of Care: 11/03/2015 Form Type Recipient Paper Patient PB Electronic Signature(s) Signed: 11/03/2015 2:10:03 PM By: Judene Companion MD Previous Signature: 11/03/2015 2:03:37 PM Version By: Ruthine Dose Entered By: Judene Companion on 11/03/2015 14:10:03 [name] ([id number]) -------------------------------------------------------------------------------- Lower Extremity Assessment Details Patient Name: [name] Ada, [name] Date of Service: 11/03/2015 1:30 PM Medical Record Number: [id number] Patient Account Number: [id number] Date of Birth/Sex: [birth year] (70 y.o. Male) Treating RN: Montey Hora Primary Care Physician: Fulton Reek Other Clinician: Referring Physician: Fulton Reek Treating Physician/Extender: Judene Companion Weeks in Treatment: 5 Electronic Signature(s) Signed: 11/03/2015 5:21:35 PM By: Montey Hora Entered By: Montey Hora on 11/03/2015 13:46:02 [name] ([id number]) -------------------------------------------------------------------------------- Multi Wound Chart Details Patient Name: [name] Ada, [name] Date of Service: 11/03/2015 1:30 PM Medical Record Number: [id number] Patient Account Number: [id number] Date of Birth/Sex: 1946-01-03 (70 y.o. Male) Treating RN: Montey Hora Primary Care Physician: Fulton Reek Other Clinician: Referring Physician: Fulton Reek Treating Physician/Extender: Benjaman Pott in Treatment: 5 Vital Signs Height(in): 68 Pulse(bpm): 78 Weight(lbs): 175 Blood Pressure 142/69 (mmHg): Body Mass Index(BMI): 27 Temperature(F):  98.5 Respiratory Rate 18 (breaths/min): Photos: [2:No Photos] [3:No Photos] [N/A:N/A] Wound Location: [2:Sacrum] [3:Right, Distal Gluteus] [N/A:N/A] Wounding Event: [2:Gradually Appeared] [3:Pressure Injury] [N/A:N/A] Primary Etiology: [2:Pressure Ulcer] [3:Pressure Ulcer] [N/A:N/A] Comorbid History: [2:Chronic Obstructive Pulmonary Disease (COPD), Arrhythmia, Congestive Heart Failure, Coronary Artery Disease, Hypertension, Peripheral Venous Disease, Osteoarthritis] [3:N/A] [N/A:N/A] Date Acquired: [2:06/01/2015] [3:10/06/2015] [N/A:N/A] Weeks of Treatment: [2:5] [3:2] [N/A:N/A] Wound Status: [2:Open] [3:Healed - Epithelialized] [N/A:N/A] Measurements L x W x D 0.6x0.2x0.1 [3:0x0x0] [N/A:N/A] (cm) Area (cm) : [2:0.094] [3:0] [N/A:N/A] Volume (cm) : [2:0.009] [3:0] [N/A:N/A] % Reduction in Area: [2:99.40%] [3:100.00%] [N/A:N/A] %  Reduction in Volume: 99.40% [3:100.00%] [N/A:N/A] Classification: [2:Category/Stage II] [3:Category/Stage II] [N/A:N/A] Exudate Amount: [2:Large] [3:N/A] [N/A:N/A] Exudate Type: [2:Serous] [3:N/A] [N/A:N/A] Exudate Color: [2:amber] [3:N/A] [N/A:N/A] Wound Margin: [2:Flat and Intact] [3:N/A] [N/A:N/A] Granulation Amount: [2:Medium (34-66%)] [3:N/A] [N/A:N/A] Granulation Quality: [2:Pink] [3:N/A] [N/A:N/A] Necrotic Amount: [2:Medium (34-66%)] [3:N/A] [N/A:N/A] Exposed Structures: [3:N/A] [N/A:N/A] Fascia: No Fat: No Tendon: No Muscle: No Joint: No Bone: No Limited to Skin Breakdown Epithelialization: Medium (34-66%) N/A N/A Periwound Skin Texture: No Abnormalities Noted No Abnormalities Noted N/A Periwound Skin Moist: Yes No Abnormalities Noted N/A Moisture: Periwound Skin Color: Erythema: Yes No Abnormalities Noted N/A Erythema Location: Circumferential N/A N/A Temperature: No Abnormality N/A N/A Tenderness on Yes No N/A Palpation: Wound Preparation: Ulcer Cleansing: N/A N/A Rinsed/Irrigated with Saline Topical Anesthetic Applied: Other:  lidocaine 4% Treatment Notes Electronic Signature(s) Signed: 11/03/2015 5:21:35 PM By: Montey Hora Entered By: Montey Hora on 11/03/2015 13:49:04 [name] ([id number]) -------------------------------------------------------------------------------- [place] Details Patient Name: [name] Ada, [name] Date of Service: 11/03/2015 1:30 PM Medical Record Number: [id number] Patient Account Number: [id number] Date of Birth/Sex: [birth year] (70 y.o. Male) Treating RN: Montey Hora Primary Care Physician: Fulton Reek Other Clinician: Referring Physician: Fulton Reek Treating Physician/Extender: Benjaman Pott in Treatment: 5 Active Inactive Orientation to the Wound Care Program Nursing Diagnoses: Knowledge deficit related to the wound healing center program Goals: Patient/caregiver will verbalize understanding of the [facility] Program Date Initiated: 10/20/2015 Goal Status: Active Interventions: Provide education on orientation to the wound center Notes: Pressure Nursing Diagnoses: Knowledge deficit related to management of pressures ulcers Goals: Patient will remain free from development of additional pressure ulcers Date Initiated: 10/20/2015 Goal Status: Active Interventions: Assess: immobility, friction, shearing, incontinence upon admission and as needed Notes: Electronic Signature(s) Signed: 11/03/2015 5:21:35 PM By: Montey Hora Entered By: Montey Hora on 11/03/2015 13:48:55 [name] ([id number]) -------------------------------------------------------------------------------- Pain Assessment Details Patient Name: [name] Ada, [name] Date of Service: 11/03/2015 1:30 PM Medical Record Number: [id number] Patient Account Number: [id number] Date of Birth/Sex: [birth year] (70 y.o. Male) Treating RN: Montey Hora Primary Care Physician: Fulton Reek Other Clinician: Referring Physician: Fulton Reek Treating Physician/Extender: Benjaman Pott in Treatment: 5 Active Problems Location of Pain Severity and Description of Pain Patient Has Paino Yes Site Locations Pain Location: Pain in Ulcers With Dressing Change: Yes Duration of the Pain. Constant / Intermittento Constant Rate the pain. Current Pain Level: 5 Pain Management and Medication Current Pain Management: Notes Topical or injectable lidocaine is offered to patient for acute pain when surgical debridement is performed. If needed, Patient is instructed to use over the counter pain medication for the following 24-48 hours after debridement. Wound care MDs do not prescribed pain medications. Patient has chronic pain or uncontrolled pain. Patient has been instructed to make an appointment with their Primary Care Physician for pain management. Electronic Signature(s) Signed: 11/03/2015 5:21:35 PM By: Montey Hora Entered By: Montey Hora on 11/03/2015 13:43:24 [name] ([id number]) -------------------------------------------------------------------------------- Patient/Caregiver Education Details Patient Name: [name] Ada, [name] Date of Service: 11/03/2015 1:30 PM Medical Record Number: [id number] Patient Account Number: [id number] Date of Birth/Gender: [birth year] (70 y.o. Male) Treating RN: Montey Hora Primary Care Physician: Fulton Reek Other Clinician: Referring Physician: Fulton Reek Treating Physician/Extender: Benjaman Pott in Treatment: 5 Education Assessment Education Provided To: Patient Education Topics Provided Wound/Skin Impairment: Handouts: Other: change dressing as ordered Methods: Demonstration, Explain/Verbal Responses: State content correctly Electronic Signature(s) Signed: 11/04/2015 1:16:39 PM By: Judene Companion MD Entered By: Judene Companion on 11/03/2015 14:10:15  Chijioke, Lungren Sujay [name] Kitchen  ([id number]) -------------------------------------------------------------------------------- Wound Assessment Details Patient Name: DANEY, [name] Date of Service: 11/03/2015 1:30 PM Medical Record Number: [id number] Patient Account Number: [id number] Date of Birth/Sex: April 08, 1946 (70 y.o. Male) Treating RN: Montey Hora Primary Care Physician: Fulton Reek Other Clinician: Referring Physician: Fulton Reek Treating Physician/Extender: Benjaman Pott in Treatment: 5 Wound Status Wound Number: 2 Primary Pressure Ulcer Etiology: Wound Location: Sacrum Wound Open Wounding Event: Gradually Appeared Status: Date Acquired: 06/01/2015 Comorbid Chronic Obstructive Pulmonary Disease Weeks Of Treatment: 5 History: (COPD), Arrhythmia, Congestive Heart Clustered Wound: No Failure, Coronary Artery Disease, Hypertension, Peripheral Venous Disease, Osteoarthritis Photos Photo Uploaded By: Montey Hora on 11/03/2015 14:53:10 Wound Measurements Length: (cm) 0.6 Width: (cm) 0.2 Depth: (cm) 0.1 Area: (cm) 0.094 Volume: (cm) 0.009 % Reduction in Area: 99.4% % Reduction in Volume: 99.4% Epithelialization: Medium (34-66%) Tunneling: No Undermining: No Wound Description Classification: Category/Stage II Wound Margin: Flat and Intact Exudate Amount: Large Exudate Type: Serous Exudate Color: amber Foul Odor After Cleansing: No Wound Bed Granulation Amount: Medium (34-66%) Exposed Structure Granulation Quality: Pink Fascia Exposed: No [name], [name] ([id number]) Necrotic Amount: Medium (34-66%) Fat Layer Exposed: No Necrotic Quality: Adherent Slough Tendon Exposed: No Muscle Exposed: No Joint Exposed: No Bone Exposed: No Limited to Skin Breakdown Periwound Skin Texture Texture Color No Abnormalities Noted: No No Abnormalities Noted: No Erythema: Yes Moisture Erythema Location: Circumferential No Abnormalities Noted: No Moist: Yes Temperature /  Pain Temperature: No Abnormality Tenderness on Palpation: Yes Wound Preparation Ulcer Cleansing: Rinsed/Irrigated with Saline Topical Anesthetic Applied: Other: lidocaine 4%, Treatment Notes Wound #2 (Sacrum) 1. Cleansed with: Clean wound with Normal Saline 2. Anesthetic Topical Lidocaine 4% cream to wound bed prior to debridement 3. Peri-wound Care: Skin Prep 5. Secondary Dressing Applied Bordered Foam Dressing Electronic Signature(s) Signed: 11/03/2015 5:21:35 PM By: Montey Hora Entered By: Montey Hora on 11/03/2015 13:48:33 [name] ([id number]) -------------------------------------------------------------------------------- Wound Assessment Details Patient Name: [name] Ada, [name] Date of Service: 11/03/2015 1:30 PM Medical Record Number: [id number] Patient Account Number: [id number] Date of Birth/Sex: [birth year] (70 y.o. Male) Treating RN: Montey Hora Primary Care Physician: Fulton Reek Other Clinician: Referring Physician: Fulton Reek Treating Physician/Extender: Benjaman Pott in Treatment: 5 Wound Status Wound Number: 3 Primary Etiology: Pressure Ulcer Wound Location: Right, Distal Gluteus Wound Status: Healed - Epithelialized Wounding Event: Pressure Injury Date Acquired: 10/06/2015 Weeks Of Treatment: 2 Clustered Wound: No Photos Photo Uploaded By: Montey Hora on 11/03/2015 14:53:11 Wound Measurements Length: (cm) 0 Width: (cm) 0 Depth: (cm) 0 Area: (cm) 0 Volume: (cm) 0 % Reduction in Area: 100% % Reduction in Volume: 100% Wound Description Classification: Category/Stage II Periwound Skin Texture Texture Color No Abnormalities Noted: No No Abnormalities Noted: No Moisture No Abnormalities Noted: No Electronic Signature(s) Signed: 11/03/2015 5:21:35 PM By: Michelene Heady, [name] Kitchen ([id number]) Entered By: Montey Hora on 11/03/2015 13:48:46 [name] -------------------------------------------------------------------------------- Vitals Details Patient Name: [name] Ada, [name] Date of Service: 11/03/2015 1:30 PM Medical Record Number: [id number] Patient Account Number: [id number] Date of Birth/Sex: 1945-07-11 (70 y.o. Male) Treating RN: Montey Hora Primary Care Physician: Fulton Reek Other Clinician: Referring Physician: Fulton Reek Treating Physician/Extender: Benjaman Pott in Treatment: 5 Vital Signs Time Taken: 13:43 Temperature (F): 98.5 Height (in): 68 Pulse (bpm): 78 Weight (lbs): 175 Respiratory Rate (breaths/min): 18 Body Mass Index (BMI): 26.6 Blood Pressure (mmHg): 142/69 Reference Range: 80 - 120 mg / dl Electronic Signature(s) Signed: 11/03/2015 5:21:35 PM By: Montey Hora Entered By: Montey Hora on 11/03/2015  13:43:47 

## 2015-11-10 ENCOUNTER — Encounter: Payer: Medicare Other | Admitting: Surgery

## 2015-11-10 DIAGNOSIS — L89153 Pressure ulcer of sacral region, stage 3: Secondary | ICD-10-CM | POA: Diagnosis not present

## 2015-11-10 NOTE — Progress Notes (Signed)
[name], BICKHART ([id number]) Visit Report for 11/10/2015 Chief Complaint Document Details Patient Name: [name], [name] Date of Service: 11/10/2015 10:00 AM Medical Record Number: [id number] Patient Account Number: [id number] Date of Birth/Sex: 06/13/1945 (70 y.o. Male) Treating RN: Montey Hora Primary Care Physician: Fulton Reek Other Clinician: Referring Physician: Fulton Reek Treating Physician/Extender: Frann Rider in Treatment: 6 Information Obtained from: Patient Chief Complaint Patient presents to the wound care center for a consult due non healing wound for about 4 months now Electronic Signature(s) Signed: 11/10/2015 10:32:51 AM By: Christin Fudge MD, FACS Entered By: Christin Fudge on 11/10/2015 10:32:51 [name] ([id number]) -------------------------------------------------------------------------------- Debridement Details Patient Name: [name], [name] Date of Service: 11/10/2015 10:00 AM Medical Record Number: [id number] Patient Account Number: [id number] Date of Birth/Sex: 1946/02/27 (70 y.o. Male) Treating RN: Montey Hora Primary Care Physician: Fulton Reek Other Clinician: Referring Physician: Fulton Reek Treating Physician/Extender: Frann Rider in Treatment: 6 Debridement Performed for Wound #2 Sacrum Assessment: Performed By: Physician Christin Fudge, MD Debridement: Debridement Pre-procedure Yes Verification/Time Out Taken: Start Time: 10:26 Pain Control: Lidocaine 4% Topical Solution Level: Skin/Subcutaneous Tissue Total Area Debrided (L x 0.5 (cm) x 0.2 (cm) = 0.1 (cm) W): Tissue and other Viable, Non-Viable, Fibrin/Slough, Subcutaneous material debrided: Instrument: Curette Bleeding: Minimum Hemostasis Achieved: Pressure End Time: 10:28 Procedural Pain: 0 Post Procedural Pain: 0 Response to Treatment: Procedure was tolerated well Post Debridement Measurements of Total Wound Length: (cm)  0.5 Stage: Category/Stage II Width: (cm) 0.2 Depth: (cm) 0.1 Volume: (cm) 0.008 Post Procedure Diagnosis Same as Pre-procedure Electronic Signature(s) Signed: 11/10/2015 10:32:44 AM By: Christin Fudge MD, FACS Signed: 11/10/2015 1:02:04 PM By: Montey Hora Entered By: Christin Fudge on 11/10/2015 10:32:44 [name] ([id number]) -------------------------------------------------------------------------------- HPI Details Patient Name: [name], [name] Date of Service: 11/10/2015 10:00 AM Medical Record Number: [id number] Patient Account Number: [id number] Date of Birth/Sex: May 29, 1945 (70 y.o. Male) Treating RN: Montey Hora Primary Care Physician: Fulton Reek Other Clinician: Referring Physician: Fulton Reek Treating Physician/Extender: Frann Rider in Treatment: 6 History of Present Illness Location: ulcerated areas in the region of the sacrum bilaterally Quality: Patient reports experiencing a shooting pain to affected area(s). Severity: Patient states wound are getting worse. Duration: Patient has had the wound for > 3 months prior to seeking treatment at the wound center Timing: Pain in wound is constant (hurts all the time) Context: The wound would happen gradually Modifying Factors: Other treatment(s) tried include:has been treated by his PCP and has already been on chronic pain medications Associated Signs and Symptoms: Patient reports having difficulty standing for long periods. HPI Description: 70 year old gentleman who was referred to [place] by his PCP Dr. Fulton Reek for decubitus ulcers in the region of the sacrum which are very painful and these had them for about 4 months now. The patient's past medical history includes aortic stenosis, atrial fibrillation, coronary artery disease, CHF, COPD, coronary artery disease, prediabetes, peripheral vascular disease, nicotine addiction. In the past he's had carotid artery angioplasty and coronary  artery bypass graft and VSD repair. He now has a chronic hip condition which is causing him a lot of pain and he goes to a pain clinic for a long while for this. He's been a smoker for over 40 years and smokes about a pack of cigarettes a day. His last hemoglobin A1c done in February of this year was 5.7. 10/06/2015 -- he continues to have a lot of pain around his decubitus ulcers and other than that has been trying  his best to offload. He is also working on his smoking cessation. Electronic Signature(s) Signed: 11/10/2015 10:32:56 AM By: Christin Fudge MD, FACS Entered By: Christin Fudge on 11/10/2015 10:32:56 [name] ([id number]) -------------------------------------------------------------------------------- Physical Exam Details Patient Name: [name], [name] Date of Service: 11/10/2015 10:00 AM Medical Record Number: [id number] Patient Account Number: [id number] Date of Birth/Sex: [birth year] (70 y.o. Male) Treating RN: Montey Hora Primary Care Physician: Fulton Reek Other Clinician: Referring Physician: Fulton Reek Treating Physician/Extender: Frann Rider in Treatment: 6 Constitutional . Pulse regular. Respirations normal and unlabored. Afebrile. . Eyes Nonicteric. Reactive to light. Ears, Nose, Mouth, and Throat Lips, teeth, and gums WNL.Marland Kitchen Moist mucosa without lesions. Neck supple and nontender. No palpable supraclavicular or cervical adenopathy. Normal sized without goiter. Respiratory WNL. No retractions.. Breath sounds WNL, No rubs, rales, rhonchi, or wheeze.. Cardiovascular Heart rhythm and rate regular, no murmur or gallop.. Pedal Pulses WNL. No clubbing, cyanosis or edema. Lymphatic No adneopathy. No adenopathy. No adenopathy. Musculoskeletal Adexa without tenderness or enlargement.. Digits and nails w/o clubbing, cyanosis, infection, petechiae, ischemia, or inflammatory conditions.. Integumentary (Hair, Skin) No suspicious lesions. No  crepitus or fluctuance. No peri-wound warmth or erythema. No masses.Marland Kitchen Psychiatric Judgement and insight Intact.. No evidence of depression, anxiety, or agitation.. Notes the wounds have done remarkably well and except for a small area on the mid sacral region which need sharp debridement with a #3 curet the rest of them of all healed. Subcutaneous slough was removed and bleeding controlled with pressure. Electronic Signature(s) Signed: 11/10/2015 10:33:31 AM By: Christin Fudge MD, FACS Entered By: Christin Fudge on 11/10/2015 10:33:31 [name] ([id number]) -------------------------------------------------------------------------------- Physician Orders Details Patient Name: [name], Manjinder B. Date of Service: 11/10/2015 10:00 AM Medical Record Number: [id number] Patient Account Number: [id number] Date of Birth/Sex: 01/22/1946 (70 y.o. Male) Treating RN: Montey Hora Primary Care Physician: Fulton Reek Other Clinician: Referring Physician: Fulton Reek Treating Physician/Extender: Frann Rider in Treatment: 6 Verbal / Phone Orders: Yes Clinician: Montey Hora Read Back and Verified: Yes Diagnosis Coding Wound Cleansing Wound #2 Sacrum o Clean wound with Normal Saline. o Cleanse wound with mild soap and water o May Shower, gently pat wound dry prior to applying new dressing. Skin Barriers/Peri-Wound Care Wound #2 Sacrum o Skin Prep Primary Wound Dressing Wound #2 Sacrum o Prisma Ag Secondary Dressing Wound #2 Sacrum o Boardered Foam Dressing Dressing Change Frequency Wound #2 Sacrum o Change dressing every day. Follow-up Appointments Wound #2 Sacrum o Return Appointment in 1 week. Off-Loading Wound #2 Sacrum o Turn and reposition every 2 hours Additional Orders / Instructions Wound #2 Sacrum o Stop Smoking o Increase protein intake. FREDERICH, STELLHORN ([id number]) Medications-please add to medication list. Wound #2  Sacrum o Other: - Vitamin C, Zinc, Multivitamins Electronic Signature(s) Signed: 11/10/2015 1:02:04 PM By: Montey Hora Signed: 11/10/2015 3:43:28 PM By: Christin Fudge MD, FACS Entered By: Montey Hora on 11/10/2015 10:29:11 [name] ([id number]) -------------------------------------------------------------------------------- Problem List Details Patient Name: [name], Betzalel B. Date of Service: 11/10/2015 10:00 AM Medical Record Number: [id number] Patient Account Number: [id number] Date of Birth/Sex: 1945/10/07 (70 y.o. Male) Treating RN: Montey Hora Primary Care Physician: Fulton Reek Other Clinician: Referring Physician: Fulton Reek Treating Physician/Extender: Frann Rider in Treatment: 6 Active Problems ICD-10 Encounter Code Description Active Date Diagnosis L89.153 Pressure ulcer of sacral region, stage 3 09/29/2015 Yes F17.218 Nicotine dependence, cigarettes, with other nicotine- 09/29/2015 Yes induced disorders G89.4 Chronic pain syndrome 09/29/2015 Yes L89.313 Pressure ulcer of right buttock, stage 3 10/20/2015  Yes Inactive Problems Resolved Problems Electronic Signature(s) Signed: 11/10/2015 10:32:21 AM By: Christin Fudge MD, FACS Entered By: Christin Fudge on 11/10/2015 10:32:21 [name] ([id number]) -------------------------------------------------------------------------------- Progress Note Details Patient Name: [name], Sostenes B. Date of Service: 11/10/2015 10:00 AM Medical Record Number: [id number] Patient Account Number: [id number] Date of Birth/Sex: 16-Nov-1945 (70 y.o. Male) Treating RN: Montey Hora Primary Care Physician: Fulton Reek Other Clinician: Referring Physician: Fulton Reek Treating Physician/Extender: Frann Rider in Treatment: 6 Subjective Chief Complaint Information obtained from Patient Patient presents to the wound care center for a consult due non healing wound for about 4 months  now History of Present Illness (HPI) The following HPI elements were documented for the patient's wound: Location: ulcerated areas in the region of the sacrum bilaterally Quality: Patient reports experiencing a shooting pain to affected area(s). Severity: Patient states wound are getting worse. Duration: Patient has had the wound for > 3 months prior to seeking treatment at the wound center Timing: Pain in wound is constant (hurts all the time) Context: The wound would happen gradually Modifying Factors: Other treatment(s) tried include:has been treated by his PCP and has already been on chronic pain medications Associated Signs and Symptoms: Patient reports having difficulty standing for long periods. 70 year old gentleman who was referred to [place] by his PCP Dr. Fulton Reek for decubitus ulcers in the region of the sacrum which are very painful and these had them for about 4 months now. The patient's past medical history includes aortic stenosis, atrial fibrillation, coronary artery disease, CHF, COPD, coronary artery disease, prediabetes, peripheral vascular disease, nicotine addiction. In the past he's had carotid artery angioplasty and coronary artery bypass graft and VSD repair. He now has a chronic hip condition which is causing him a lot of pain and he goes to a pain clinic for a long while for this. He's been a smoker for over 40 years and smokes about a pack of cigarettes a day. His last hemoglobin A1c done in February of this year was 5.7. 10/06/2015 -- he continues to have a lot of pain around his decubitus ulcers and other than that has been trying his best to offload. He is also working on his smoking cessation. Objective Constitutional Pulse regular. Respirations normal and unlabored. Afebrile. Vitals Time Taken: 10:09 AM, Height: 68 in, Weight: 175 lbs, BMI: 26.6, Temperature: 98.5 F, Pulse: 86 Autrey, Raylyn B. ([id number]) bpm, Respiratory Rate: 18 breaths/min, Blood  Pressure: 121/64 mmHg. Eyes Nonicteric. Reactive to light. Ears, Nose, Mouth, and Throat Lips, teeth, and gums WNL.Marland Kitchen Moist mucosa without lesions. Neck supple and nontender. No palpable supraclavicular or cervical adenopathy. Normal sized without goiter. Respiratory WNL. No retractions.. Breath sounds WNL, No rubs, rales, rhonchi, or wheeze.. Cardiovascular Heart rhythm and rate regular, no murmur or gallop.. Pedal Pulses WNL. No clubbing, cyanosis or edema. Lymphatic No adneopathy. No adenopathy. No adenopathy. Musculoskeletal Adexa without tenderness or enlargement.. Digits and nails w/o clubbing, cyanosis, infection, petechiae, ischemia, or inflammatory conditions.Marland Kitchen Psychiatric Judgement and insight Intact.. No evidence of depression, anxiety, or agitation.. General Notes: the wounds have done remarkably well and except for a small area on the mid sacral region which need sharp debridement with a #3 curet the rest of them of all healed. Subcutaneous slough was removed and bleeding controlled with pressure. Integumentary (Hair, Skin) No suspicious lesions. No crepitus or fluctuance. No peri-wound warmth or erythema. No masses.. Wound #2 status is Open. Original cause of wound was Gradually Appeared. The wound is located on the Sacrum.  The wound measures 0.5cm length x 0.2cm width x 0.1cm depth; 0.079cm^2 area and 0.008cm^3 volume. The wound is limited to skin breakdown. There is no tunneling or undermining noted. There is a large amount of serous drainage noted. The wound margin is flat and intact. There is large (67-100%) pink granulation within the wound bed. There is a small (1-33%) amount of necrotic tissue within the wound bed including Adherent Slough. The periwound skin appearance exhibited: Moist, Erythema. The surrounding wound skin color is noted with erythema which is circumferential. Periwound temperature was noted as No Abnormality. The periwound has tenderness on  palpation. Assessment Hobert, Schaum Cher B. ([id number]) Active Problems ICD-10 L89.153 - Pressure ulcer of sacral region, stage 3 F17.218 - Nicotine dependence, cigarettes, with other nicotine-induced disorders G89.4 - Chronic pain syndrome L89.313 - Pressure ulcer of right buttock, stage 3 Procedures Wound #2 Wound #2 is a Pressure Ulcer located on the Sacrum . There was a Skin/Subcutaneous Tissue Debridement [id number]) debridement with total area of 0.1 sq cm performed by Christin Fudge, MD. with the following instrument(s): Curette to remove Viable and Non-Viable tissue/material including Fibrin/Slough and Subcutaneous after achieving pain control using Lidocaine 4% Topical Solution. A time out was conducted prior to the start of the procedure. A Minimum amount of bleeding was controlled with Pressure. The procedure was tolerated well with a pain level of 0 throughout and a pain level of 0 following the procedure. Post Debridement Measurements: 0.5cm length x 0.2cm width x 0.1cm depth; 0.008cm^3 volume. Post debridement Stage noted as Category/Stage II. Post procedure Diagnosis Wound #2: Same as Pre-Procedure Plan Wound Cleansing: Wound #2 Sacrum: Clean wound with Normal Saline. Cleanse wound with mild soap and water May Shower, gently pat wound dry prior to applying new dressing. Skin Barriers/Peri-Wound Care: Wound #2 Sacrum: Skin Prep Primary Wound Dressing: Wound #2 Sacrum: Prisma Ag Secondary Dressing: Wound #2 Sacrum: Boardered Foam Dressing Dressing Change Frequency: Wound #2 Sacrum: Change dressing every day. DONTRAE, GAWRONSKI ([id number]) Follow-up Appointments: Wound #2 Sacrum: Return Appointment in 1 week. Off-Loading: Wound #2 Sacrum: Turn and reposition every 2 hours Additional Orders / Instructions: Wound #2 Sacrum: Stop Smoking Increase protein intake. Medications-please add to medication list.: Wound #2 Sacrum: Other: - Vitamin C, Zinc,  Multivitamins At this stage I have asked him to continue to offload as well as possible. Having removed the slough today we will use Prisma AG and a bordered foam over the open area. I anticipate discharge soon. Electronic Signature(s) Signed: 11/10/2015 10:34:16 AM By: Christin Fudge MD, FACS Entered By: Christin Fudge on 11/10/2015 10:34:16 [name] ([id number]) -------------------------------------------------------------------------------- SuperBill Details Patient Name: [name], Lacy B. Date of Service: 11/10/2015 Medical Record Number: [id number] Patient Account Number: [id number] Date of Birth/Sex: [birth year] (70 y.o. Male) Treating RN: Montey Hora Primary Care Physician: Fulton Reek Other Clinician: Referring Physician: Fulton Reek Treating Physician/Extender: Frann Rider in Treatment: 6 Diagnosis Coding ICD-10 Codes Code Description [phone] Pressure ulcer of sacral region, stage 3 F17.218 Nicotine dependence, cigarettes, with other nicotine-induced disorders G89.4 Chronic pain syndrome L89.313 Pressure ulcer of right buttock, stage 3 Facility Procedures CPT4 Code Description: [id number] - DEB SUBQ TISSUE 20 SQ CM/< ICD-10 Description Diagnosis L89.153 Pressure ulcer of sacral region, stage 3 F17.218 Nicotine dependence, cigarettes, with other nicotin G89.4 Chronic pain syndrome L89.313 Pressure  ulcer of right buttock, stage 3 Modifier: e-induced di Quantity: 1 sorders Physician Procedures CPT4 Code Description: [id number] 11042 - WC PHYS SUBQ TISS 20 SQ CM ICD-10 Description Diagnosis L89.153  Pressure ulcer of sacral region, stage 3 F17.218 Nicotine dependence, cigarettes, with other nicotin G89.4 Chronic pain syndrome L89.313 Pressure ulcer  of right buttock, stage 3 Modifier: e-induced dis Quantity: 1 orders Electronic Signature(s) Signed: 11/10/2015 10:34:26 AM By: Christin Fudge MD, FACS Entered By: Christin Fudge on 11/10/2015 10:34:26

## 2015-11-10 NOTE — Progress Notes (Signed)
[name], BICKSLER ([id number]) Visit Report for 11/10/2015 Arrival Information Details Patient Name: [name], [name] Date of Service: 11/10/2015 10:00 AM Medical Record Number: [id number] Patient Account Number: [id number] Date of Birth/Sex: January 30, 1946 (70 y.o. Male) Treating RN: Montey Hora Primary Care Physician: Fulton Reek Other Clinician: Referring Physician: Fulton Reek Treating Physician/Extender: Frann Rider in Treatment: 6 Visit Information History Since Last Visit Added or deleted any medications: No Patient Arrived: Ambulatory Any new allergies or adverse reactions: No Arrival Time: 10:08 Had a fall or experienced change in No Accompanied By: spouse activities of daily living that may affect Transfer Assistance: None risk of falls: Patient Identification Verified: Yes Signs or symptoms of abuse/neglect since last No Secondary Verification Process Yes visito Completed: Hospitalized since last visit: No Patient Requires Transmission-Based No Pain Present Now: No Precautions: Patient Has Alerts: No Electronic Signature(s) Signed: 11/10/2015 1:02:04 PM By: Montey Hora Entered By: Montey Hora on 11/10/2015 10:08:46 [name] ([id number]) -------------------------------------------------------------------------------- Encounter Discharge Information Details Patient Name: [name] Ada, Halen [name] Date of Service: 11/10/2015 10:00 AM Medical Record Number: [id number] Patient Account Number: [id number] Date of Birth/Sex: 1946-02-08 (70 y.o. Male) Treating RN: Montey Hora Primary Care Physician: Fulton Reek Other Clinician: Referring Physician: Fulton Reek Treating Physician/Extender: Frann Rider in Treatment: 6 Encounter Discharge Information Items Discharge Pain Level: 0 Discharge Condition: Stable Ambulatory Status: Ambulatory Discharge Destination: Home Transportation: Private Auto Accompanied By:  spouse Schedule Follow-up Appointment: Yes Medication Reconciliation completed and provided to Patient/Care No Kioni Stahl: Provided on Clinical Summary of Care: 11/10/2015 Form Type Recipient Paper Patient PB Electronic Signature(s) Signed: 11/10/2015 10:37:11 AM By: Ruthine Dose Entered By: Ruthine Dose on 11/10/2015 10:37:11 [name] ([id number]) -------------------------------------------------------------------------------- Multi Wound Chart Details Patient Name: [name] Ada, Wofford [name] Date of Service: 11/10/2015 10:00 AM Medical Record Number: [id number] Patient Account Number: [id number] Date of Birth/Sex: Jul 29, 1945 (70 y.o. Male) Treating RN: Montey Hora Primary Care Physician: Fulton Reek Other Clinician: Referring Physician: Fulton Reek Treating Physician/Extender: Frann Rider in Treatment: 6 Vital Signs Height(in): 68 Pulse(bpm): 86 Weight(lbs): 175 Blood Pressure 121/64 (mmHg): Body Mass Index(BMI): 27 Temperature(F): 98.5 Respiratory Rate 18 (breaths/min): Photos: [2:No Photos] [N/A:N/A] Wound Location: [2:Sacrum] [N/A:N/A] Wounding Event: [2:Gradually Appeared] [N/A:N/A] Primary Etiology: [2:Pressure Ulcer] [N/A:N/A] Comorbid History: [2:Chronic Obstructive Pulmonary Disease (COPD), Arrhythmia, Congestive Heart Failure, Coronary Artery Disease, Hypertension, Peripheral Venous Disease, Osteoarthritis] [N/A:N/A] Date Acquired: [2:06/01/2015] [N/A:N/A] Weeks of Treatment: [2:6] [N/A:N/A] Wound Status: [2:Open] [N/A:N/A] Measurements L x W x D 0.5x0.2x0.1 [N/A:N/A] (cm) Area (cm) : [2:0.079] [N/A:N/A] Volume (cm) : [2:0.008] [N/A:N/A] % Reduction in Area: [2:99.50%] [N/A:N/A] % Reduction in Volume: 99.50% [N/A:N/A] Classification: [2:Category/Stage II] [N/A:N/A] Exudate Amount: [2:Large] [N/A:N/A] Exudate Type: [2:Serous] [N/A:N/A] Exudate Color: [2:amber] [N/A:N/A] Wound Margin: [2:Flat and Intact] [N/A:N/A] Granulation  Amount: [2:Large (67-100%)] [N/A:N/A] Granulation Quality: [2:Pink] [N/A:N/A] Necrotic Amount: [2:Small (1-33%)] [N/A:N/A] Exposed Structures: [N/A:N/A] Fascia: No Fat: No Tendon: No Muscle: No Joint: No Bone: No Limited to Skin Breakdown Epithelialization: Medium (34-66%) N/A N/A Periwound Skin Texture: No Abnormalities Noted N/A N/A Periwound Skin Moist: Yes N/A N/A Moisture: Periwound Skin Color: Erythema: Yes N/A N/A Erythema Location: Circumferential N/A N/A Temperature: No Abnormality N/A N/A Tenderness on Yes N/A N/A Palpation: Wound Preparation: Ulcer Cleansing: N/A N/A Rinsed/Irrigated with Saline Topical Anesthetic Applied: Other: lidocaine 4% Treatment Notes Electronic Signature(s) Signed: 11/10/2015 1:02:04 PM By: Montey Hora Entered By: Montey Hora on 11/10/2015 10:27:34 [name] ([id number]) -------------------------------------------------------------------------------- [place] Details Patient Name: [name] Ada, Kathy [name] Date of Service: 11/10/2015 10:00 AM Medical  Record Number: [id number] Patient Account Number: [id number] Date of Birth/Sex: [birth year] (70 y.o. Male) Treating RN: Montey Hora Primary Care Physician: Fulton Reek Other Clinician: Referring Physician: Fulton Reek Treating Physician/Extender: Frann Rider in Treatment: 6 Active Inactive Orientation to the Wound Care Program Nursing Diagnoses: Knowledge deficit related to the wound healing center program Goals: Patient/caregiver will verbalize understanding of the [facility] Date Initiated: 10/20/2015 Goal Status: Active Interventions: Provide education on orientation to the wound center Notes: Pressure Nursing Diagnoses: Knowledge deficit related to management of pressures ulcers Goals: Patient will remain free from development of additional pressure ulcers Date Initiated: 10/20/2015 Goal Status:  Active Interventions: Assess: immobility, friction, shearing, incontinence upon admission and as needed Notes: Electronic Signature(s) Signed: 11/10/2015 1:02:04 PM By: Montey Hora Entered By: Montey Hora on 11/10/2015 10:27:27 [name] ([id number]) -------------------------------------------------------------------------------- Pain Assessment Details Patient Name: [name] Ada, Yani [name] Date of Service: 11/10/2015 10:00 AM Medical Record Number: [id number] Patient Account Number: [id number] Date of Birth/Sex: 01/27/1946 (70 y.o. Male) Treating RN: Montey Hora Primary Care Physician: Fulton Reek Other Clinician: Referring Physician: Fulton Reek Treating Physician/Extender: Frann Rider in Treatment: 6 Active Problems Location of Pain Severity and Description of Pain Patient Has Paino No Site Locations Pain Management and Medication Current Pain Management: Notes Topical or injectable lidocaine is offered to patient for acute pain when surgical debridement is performed. If needed, Patient is instructed to use over the counter pain medication for the following 24-48 hours after debridement. Wound care MDs do not prescribed pain medications. Patient has chronic pain or uncontrolled pain. Patient has been instructed to make an appointment with their Primary Care Physician for pain management. Electronic Signature(s) Signed: 11/10/2015 1:02:04 PM By: Montey Hora Entered By: Montey Hora on 11/10/2015 10:09:06 [name] ([id number]) -------------------------------------------------------------------------------- Patient/Caregiver Education Details Patient Name: [name] Ada, Hommer [name] Date of Service: 11/10/2015 10:00 AM Medical Record Number: [id number] Patient Account Number: [id number] Date of Birth/Gender: [birth year] (70 y.o. Male) Treating RN: Montey Hora Primary Care Physician: Fulton Reek Other Clinician: Referring Physician:  Fulton Reek Treating Physician/Extender: Frann Rider in Treatment: 6 Education Assessment Education Provided To: Patient and Caregiver Education Topics Provided Wound/Skin Impairment: Handouts: Other: new wound care as ordered Methods: Demonstration, Explain/Verbal Responses: State content correctly Electronic Signature(s) Signed: 11/10/2015 1:02:04 PM By: Montey Hora Entered By: Montey Hora on 11/10/2015 10:36:47 [name] ([id number]) -------------------------------------------------------------------------------- Wound Assessment Details Patient Name: [name] Ada, Lux [name] Date of Service: 11/10/2015 10:00 AM Medical Record Number: [id number] Patient Account Number: [id number] Date of Birth/Sex: 26-Jul-1945 (70 y.o. Male) Treating RN: Montey Hora Primary Care Physician: Fulton Reek Other Clinician: Referring Physician: Fulton Reek Treating Physician/Extender: Frann Rider in Treatment: 6 Wound Status Wound Number: 2 Primary Pressure Ulcer Etiology: Wound Location: Sacrum Wound Open Wounding Event: Gradually Appeared Status: Date Acquired: 06/01/2015 Comorbid Chronic Obstructive Pulmonary Disease Weeks Of Treatment: 6 History: (COPD), Arrhythmia, Congestive Heart Clustered Wound: No Failure, Coronary Artery Disease, Hypertension, Peripheral Venous Disease, Osteoarthritis Photos Wound Measurements Length: (cm) 0.5 Width: (cm) 0.2 Depth: (cm) 0.1 Area: (cm) 0.079 Volume: (cm) 0.008 % Reduction in Area: 99.5% % Reduction in Volume: 99.5% Epithelialization: Medium (34-66%) Tunneling: No Undermining: No Wound Description Classification: Category/Stage II Wound Margin: Flat and Intact Exudate Amount: Large Exudate Type: Serous Exudate Color: amber Foul Odor After Cleansing: No Wound Bed Granulation Amount: Large (67-100%) Exposed Structure Granulation Quality: Pink Fascia Exposed: No Necrotic Amount: Small  (1-33%) Fat Layer Exposed: No Chait, Gaylon [name] ([id number]) Necrotic Quality: Adherent Slough Tendon  Exposed: No Muscle Exposed: No Joint Exposed: No Bone Exposed: No Limited to Skin Breakdown Periwound Skin Texture Texture Color No Abnormalities Noted: No No Abnormalities Noted: No Erythema: Yes Moisture Erythema Location: Circumferential No Abnormalities Noted: No Moist: Yes Temperature / Pain Temperature: No Abnormality Tenderness on Palpation: Yes Wound Preparation Ulcer Cleansing: Rinsed/Irrigated with Saline Topical Anesthetic Applied: Other: lidocaine 4%, Treatment Notes Wound #2 (Sacrum) 1. Cleansed with: Clean wound with Normal Saline 2. Anesthetic Topical Lidocaine 4% cream to wound bed prior to debridement 3. Peri-wound Care: Skin Prep 4. Dressing Applied: Prisma Ag 5. Secondary Dressing Applied Bordered Foam Dressing Electronic Signature(s) Signed: 11/10/2015 1:02:04 PM By: Montey Hora Entered By: Montey Hora on 11/10/2015 10:30:16 [name] ([id number]) -------------------------------------------------------------------------------- Vitals Details Patient Name: [name] Ada, Durwin [name] Date of Service: 11/10/2015 10:00 AM Medical Record Number: [id number] Patient Account Number: [id number] Date of Birth/Sex: 09-Nov-1945 (70 y.o. Male) Treating RN: Montey Hora Primary Care Physician: Fulton Reek Other Clinician: Referring Physician: Fulton Reek Treating Physician/Extender: Frann Rider in Treatment: 6 Vital Signs Time Taken: 10:09 Temperature (F): 98.5 Height (in): 68 Pulse (bpm): 86 Weight (lbs): 175 Respiratory Rate (breaths/min): 18 Body Mass Index (BMI): 26.6 Blood Pressure (mmHg): 121/64 Reference Range: 80 - 120 mg / dl Electronic Signature(s) Signed: 11/10/2015 1:02:04 PM By: Montey Hora Entered By: Montey Hora on 11/10/2015 10:09:28

## 2015-11-11 ENCOUNTER — Encounter: Payer: Self-pay | Admitting: Anesthesiology

## 2015-11-11 ENCOUNTER — Ambulatory Visit: Payer: Medicare Other | Attending: Anesthesiology | Admitting: Anesthesiology

## 2015-11-11 VITALS — BP 142/72 | HR 75 | Temp 98.5°F | Resp 16 | Ht 68.5 in | Wt 175.0 lb

## 2015-11-11 DIAGNOSIS — L89101 Pressure ulcer of unspecified part of back, stage 1: Secondary | ICD-10-CM | POA: Diagnosis not present

## 2015-11-11 DIAGNOSIS — M5441 Lumbago with sciatica, right side: Secondary | ICD-10-CM

## 2015-11-11 DIAGNOSIS — G8929 Other chronic pain: Secondary | ICD-10-CM | POA: Insufficient documentation

## 2015-11-11 DIAGNOSIS — M5136 Other intervertebral disc degeneration, lumbar region: Secondary | ICD-10-CM | POA: Insufficient documentation

## 2015-11-11 DIAGNOSIS — M791 Myalgia: Secondary | ICD-10-CM | POA: Insufficient documentation

## 2015-11-11 DIAGNOSIS — Z7982 Long term (current) use of aspirin: Secondary | ICD-10-CM | POA: Diagnosis not present

## 2015-11-11 DIAGNOSIS — M1288 Other specific arthropathies, not elsewhere classified, other specified site: Secondary | ICD-10-CM | POA: Diagnosis not present

## 2015-11-11 DIAGNOSIS — I739 Peripheral vascular disease, unspecified: Secondary | ICD-10-CM | POA: Insufficient documentation

## 2015-11-11 DIAGNOSIS — M47816 Spondylosis without myelopathy or radiculopathy, lumbar region: Secondary | ICD-10-CM | POA: Diagnosis not present

## 2015-11-11 DIAGNOSIS — Z79891 Long term (current) use of opiate analgesic: Secondary | ICD-10-CM | POA: Diagnosis not present

## 2015-11-11 DIAGNOSIS — M545 Low back pain: Secondary | ICD-10-CM | POA: Insufficient documentation

## 2015-11-11 DIAGNOSIS — I350 Nonrheumatic aortic (valve) stenosis: Secondary | ICD-10-CM | POA: Diagnosis not present

## 2015-11-11 MED ORDER — FENTANYL 100 MCG/HR TD PT72: 100.0000 ug | MEDICATED_PATCH | TRANSDERMAL | Status: DC

## 2015-11-11 NOTE — Progress Notes (Signed)
Patient here for medication management.  Reports good healing of pressure sores at the base of the spine which is being treated by wound care. Safety precautions to be maintained throughout the outpatient stay will include: orient to surroundings, keep bed in low position, maintain call bell within reach at all times, provide assistance with transfer out of bed and ambulation.

## 2015-11-12 NOTE — Progress Notes (Signed)
Chief complaint is low back pain  Procedure: none  History of present illness: [name] comes in today for a 2 month follow-up. He's been taking his medication as prescribed and doing well with this regimen. No other changes are noted. His strength and lower extremity function are stable and the quality characteristic and distribution of pain once again have remained stable.     BP 142/72 mmHg  Pulse 75  Temp(Src) 98.5 F (36.9 C) (Oral)  Resp 16  Ht 5' 8.5" (1.74 m)  Wt 175 lb (79.379 kg)  BMI 26.22 kg/m2  SpO2 99%   Current outpatient prescriptions:  .  aspirin 325 MG tablet, Take 325 mg by mouth daily., Disp: , Rfl:  .  busPIRone (BUSPAR) 15 MG tablet, Take 15 mg by mouth 3 (three) times daily. , Disp: , Rfl:  .  butalbital-aspirin-caffeine (FIORINAL) 50-325-40 MG capsule, Take 1 capsule by mouth 2 (two) times daily as needed for headache., Disp: , Rfl:  .  citalopram (CELEXA) 10 MG tablet, Take 10 mg by mouth daily., Disp: , Rfl:  .  docusate sodium (COLACE) 100 MG capsule, Take 100 mg by mouth 2 (two) times daily., Disp: , Rfl:  .  fentaNYL (DURAGESIC - DOSED MCG/HR) 100 MCG/HR, Place 1 patch (100 mcg total) onto the skin every other day., Disp: 15 patch, Rfl: 0 .  gemfibrozil (LOPID) 600 MG tablet, Take 600 mg by mouth 2 (two) times daily before a meal., Disp: , Rfl:  .  lidocaine (LIDOCAINE PAK) 5 % ointment, Apply 1 application topically as needed., Disp: , Rfl:  .  lisinopril (PRINIVIL,ZESTRIL) 10 MG tablet, Take 10 mg by mouth daily., Disp: , Rfl:  .  meperidine (DEMEROL) 50 MG tablet, Take 1 tablet (50 mg total) by mouth 1 day or 1 dose., Disp: 30 tablet, Rfl: 0 .  metoprolol tartrate (LOPRESSOR) 25 MG tablet, Take 25 mg by mouth daily., Disp: , Rfl:  .  Oxycodone HCl 10 MG TABS, Take by mouth. Prn, does not take every day, Disp: , Rfl:  .  tamsulosin (FLOMAX) 0.4 MG CAPS capsule, Take 0.4 mg by mouth daily., Disp: , Rfl:  .  busPIRone (BUSPAR) 10 MG tablet, Take 10  mg by mouth 3 (three) times daily. Reported on 11/11/2015, Disp: , Rfl:  .  butalbital-acetaminophen-caffeine (FIORICET, ESGIC) 50-325-40 MG per tablet, Take 1 tablet by mouth 2 (two) times daily as needed for headache. Reported on 11/11/2015, Disp: , Rfl:  .  clotrimazole-betamethasone (LOTRISONE) cream, Apply 1 application topically 2 (two) times daily. Reported on 11/11/2015, Disp: , Rfl:   Patient Active Problem List   Diagnosis Date Noted  . Stage I pressure ulcer of back 11/03/2015  . Facet arthritis of lumbar region 09/26/2014  . DDD (degenerative disc disease), lumbar 09/26/2014  . Myofacial muscle pain 09/26/2014    Allergies  Allergen Reactions  . Celebrex [Celecoxib] Other (See Comments)    Chest pain  . Penicillins Other (See Comments)    unknown    Physical exam pupils are equally round and reactive to light  Extraocular muscles are intact   Heart is regular rate and rhythm and lower extremity strength and function remains a baseline with no significant changes noted.  His right leg strength compared to left slightly diminished which is his baseline. Otherwise no change on examination noted  Assessment  #1 chronic low back pain  #2 chronic opioid management #52facet arthropathy #4 peripheral vascular disease and history of aortic stenosis  with valve repair  Plan: We'll refill medications at present with return to clinic in the next 2 months for reevaluation. He  is to continue with physical therapy exercises and aerobic conditioning as tolerated and continue follow-up with his primary care physician, Dr. Doy Hutching. Should he have any changes in his symptom quality characteristic and distribution or strength he has been instructed to contact us for further evaluation. At this point he seems to be doing quite well with his regimen.  Dr. Vashti Hey 5:03 PM

## 2015-11-17 ENCOUNTER — Encounter: Payer: Medicare Other | Admitting: Surgery

## 2015-11-17 DIAGNOSIS — L89153 Pressure ulcer of sacral region, stage 3: Secondary | ICD-10-CM | POA: Diagnosis not present

## 2015-11-18 NOTE — Progress Notes (Signed)
ZEALAND, KNIGGE ([id number]) Visit Report for 11/17/2015 Arrival Information Details Patient Name: POPPLETON, Jarron B. Date of Service: 11/17/2015 10:00 AM Medical Record Number: [id number] Patient Account Number: [id number] Date of Birth/Sex: 04/09/1946 (70 y.o. Male) Treating RN: Montey Hora Primary Care Physician: Fulton Reek Other Clinician: Referring Physician: Fulton Reek Treating Physician/Extender: Frann Rider in Treatment: 7 Visit Information History Since Last Visit Added or deleted any medications: No Patient Arrived: Ambulatory Any new allergies or adverse reactions: No Arrival Time: 10:07 Had a fall or experienced change in No Accompanied By: spouse activities of daily living that may affect Transfer Assistance: None risk of falls: Patient Identification Verified: Yes Signs or symptoms of abuse/neglect since last No Secondary Verification Process Yes visito Completed: Hospitalized since last visit: No Patient Requires Transmission-Based No Pain Present Now: No Precautions: Patient Has Alerts: No Electronic Signature(s) Signed: 11/17/2015 4:57:30 PM By: Montey Hora Entered By: Montey Hora on 11/17/2015 10:12:33 [name] ([id number]) -------------------------------------------------------------------------------- Encounter Discharge Information Details Patient Name: [name], Byron B. Date of Service: 11/17/2015 10:00 AM Medical Record Number: [id number] Patient Account Number: [id number] Date of Birth/Sex: [birth year] (70 y.o. Male) Treating RN: Montey Hora Primary Care Physician: Fulton Reek Other Clinician: Referring Physician: Fulton Reek Treating Physician/Extender: Frann Rider in Treatment: 7 Encounter Discharge Information Items Discharge Pain Level: 0 Discharge Condition: Stable Ambulatory Status: Ambulatory Discharge Destination: Home Transportation: Private Auto Accompanied By:  spouse Schedule Follow-up Appointment: Yes Medication Reconciliation completed and provided to Patient/Care No Lupita Rosales: Provided on Clinical Summary of Care: 11/17/2015 Form Type Recipient Paper Patient PB Electronic Signature(s) Signed: 11/17/2015 10:52:03 AM By: Ruthine Dose Entered By: Ruthine Dose on 11/17/2015 10:52:02 [name] ([id number]) -------------------------------------------------------------------------------- Multi Wound Chart Details Patient Name: [name], Payton B. Date of Service: 11/17/2015 10:00 AM Medical Record Number: [id number] Patient Account Number: [id number] Date of Birth/Sex: [birth year] (70 y.o. Male) Treating RN: Montey Hora Primary Care Physician: Fulton Reek Other Clinician: Referring Physician: Fulton Reek Treating Physician/Extender: Frann Rider in Treatment: 7 Vital Signs Height(in): 68 Pulse(bpm): 79 Weight(lbs): 175 Blood Pressure 152/64 (mmHg): Body Mass Index(BMI): 27 Temperature(F): 98.2 Respiratory Rate 18 (breaths/min): Photos: [2:No Photos] [N/A:N/A] Wound Location: [2:Sacrum] [N/A:N/A] Wounding Event: [2:Gradually Appeared] [N/A:N/A] Primary Etiology: [2:Pressure Ulcer] [N/A:N/A] Comorbid History: [2:Chronic Obstructive Pulmonary Disease (COPD), Arrhythmia, Congestive Heart Failure, Coronary Artery Disease, Hypertension, Peripheral Venous Disease, Osteoarthritis] [N/A:N/A] Date Acquired: [2:06/01/2015] [N/A:N/A] Weeks of Treatment: [2:7] [N/A:N/A] Wound Status: [2:Open] [N/A:N/A] Measurements L x W x D 0.5x0.2x0.1 [N/A:N/A] (cm) Area (cm) : [2:0.079] [N/A:N/A] Volume (cm) : [2:0.008] [N/A:N/A] % Reduction in Area: [2:99.50%] [N/A:N/A] % Reduction in Volume: 99.50% [N/A:N/A] Classification: [2:Category/Stage II] [N/A:N/A] Exudate Amount: [2:Large] [N/A:N/A] Exudate Type: [2:Serous] [N/A:N/A] Exudate Color: [2:amber] [N/A:N/A] Wound Margin: [2:Flat and Intact] [N/A:N/A] Granulation  Amount: [2:Large (67-100%)] [N/A:N/A] Granulation Quality: [2:Pink] [N/A:N/A] Necrotic Amount: [2:Small (1-33%)] [N/A:N/A] Exposed Structures: [N/A:N/A] Fascia: No Fat: No Tendon: No Muscle: No Joint: No Bone: No Limited to Skin Breakdown Epithelialization: Medium (34-66%) N/A N/A Periwound Skin Texture: No Abnormalities Noted N/A N/A Periwound Skin Moist: Yes N/A N/A Moisture: Periwound Skin Color: Erythema: Yes N/A N/A Erythema Location: Circumferential N/A N/A Temperature: No Abnormality N/A N/A Tenderness on Yes N/A N/A Palpation: Wound Preparation: Ulcer Cleansing: N/A N/A Rinsed/Irrigated with Saline Topical Anesthetic Applied: Other: lidocaine 4% Treatment Notes Electronic Signature(s) Signed: 11/17/2015 4:57:30 PM By: Montey Hora Entered By: Montey Hora on 11/17/2015 10:40:12 [name] ([id number]) -------------------------------------------------------------------------------- [place] Details Patient Name: [name], [name] Date of Service: 11/17/2015 10:00 AM Medical  Record Number: [id number] Patient Account Number: [id number] Date of Birth/Sex: [birth year] (70 y.o. Male) Treating RN: Montey Hora Primary Care Physician: Fulton Reek Other Clinician: Referring Physician: Fulton Reek Treating Physician/Extender: Frann Rider in Treatment: 7 Active Inactive Orientation to the Wound Care Program Nursing Diagnoses: Knowledge deficit related to the wound healing center program Goals: Patient/caregiver will verbalize understanding of the [facility] Date Initiated: 10/20/2015 Goal Status: Active Interventions: Provide education on orientation to the wound center Notes: Pressure Nursing Diagnoses: Knowledge deficit related to management of pressures ulcers Goals: Patient will remain free from development of additional pressure ulcers Date Initiated: 10/20/2015 Goal Status:  Active Interventions: Assess: immobility, friction, shearing, incontinence upon admission and as needed Notes: Electronic Signature(s) Signed: 11/17/2015 4:57:30 PM By: Montey Hora Entered By: Montey Hora on 11/17/2015 10:40:04 [name] ([id number]) -------------------------------------------------------------------------------- Pain Assessment Details Patient Name: [name], Trevyn B. Date of Service: 11/17/2015 10:00 AM Medical Record Number: [id number] Patient Account Number: [id number] Date of Birth/Sex: 04-02-1946 (70 y.o. Male) Treating RN: Montey Hora Primary Care Physician: Fulton Reek Other Clinician: Referring Physician: Fulton Reek Treating Physician/Extender: Frann Rider in Treatment: 7 Active Problems Location of Pain Severity and Description of Pain Patient Has Paino No Site Locations Pain Management and Medication Current Pain Management: Notes Topical or injectable lidocaine is offered to patient for acute pain when surgical debridement is performed. If needed, Patient is instructed to use over the counter pain medication for the following 24-48 hours after debridement. Wound care MDs do not prescribed pain medications. Patient has chronic pain or uncontrolled pain. Patient has been instructed to make an appointment with their Primary Care Physician for pain management. Electronic Signature(s) Signed: 11/17/2015 4:57:30 PM By: Montey Hora Entered By: Montey Hora on 11/17/2015 10:13:00 [name] ([id number]) -------------------------------------------------------------------------------- Patient/Caregiver Education Details Patient Name: [name], Duvall B. Date of Service: 11/17/2015 10:00 AM Medical Record Number: [id number] Patient Account Number: [id number] Date of Birth/Gender: 1945/12/20 (70 y.o. Male) Treating RN: Montey Hora Primary Care Physician: Fulton Reek Other Clinician: Referring Physician:  Fulton Reek Treating Physician/Extender: Frann Rider in Treatment: 7 Education Assessment Education Provided To: Patient and Caregiver Education Topics Provided Wound/Skin Impairment: Handouts: Other: wound care as ordered Methods: Demonstration, Explain/Verbal Responses: State content correctly Electronic Signature(s) Signed: 11/17/2015 4:57:30 PM By: Montey Hora Entered By: Montey Hora on 11/17/2015 10:49:34 [name] ([id number]) -------------------------------------------------------------------------------- Wound Assessment Details Patient Name: [name], Tyjon B. Date of Service: 11/17/2015 10:00 AM Medical Record Number: [id number] Patient Account Number: [id number] Date of Birth/Sex: May 19, 1945 (70 y.o. Male) Treating RN: Montey Hora Primary Care Physician: Fulton Reek Other Clinician: Referring Physician: Fulton Reek Treating Physician/Extender: Frann Rider in Treatment: 7 Wound Status Wound Number: 2 Primary Pressure Ulcer Etiology: Wound Location: Sacrum Wound Open Wounding Event: Gradually Appeared Status: Date Acquired: 06/01/2015 Comorbid Chronic Obstructive Pulmonary Disease Weeks Of Treatment: 7 History: (COPD), Arrhythmia, Congestive Heart Clustered Wound: No Failure, Coronary Artery Disease, Hypertension, Peripheral Venous Disease, Osteoarthritis Photos Wound Measurements Length: (cm) 0.5 Width: (cm) 0.2 Depth: (cm) 0.1 Area: (cm) 0.079 Volume: (cm) 0.008 % Reduction in Area: 99.5% % Reduction in Volume: 99.5% Epithelialization: Medium (34-66%) Tunneling: No Undermining: No Wound Description Classification: Category/Stage II Wound Margin: Flat and Intact Exudate Amount: Large Exudate Type: Serous Exudate Color: amber Foul Odor After Cleansing: No Wound Bed Granulation Amount: Large (67-100%) Exposed Structure Granulation Quality: Pink Fascia Exposed: No Necrotic Amount: Small (1-33%) Fat  Layer Exposed: No Minch, Javonn B. ([id number]) Necrotic Quality: Adherent Slough Tendon Exposed:  No Muscle Exposed: No Joint Exposed: No Bone Exposed: No Limited to Skin Breakdown Periwound Skin Texture Texture Color No Abnormalities Noted: No No Abnormalities Noted: No Erythema: Yes Moisture Erythema Location: Circumferential No Abnormalities Noted: No Moist: Yes Temperature / Pain Temperature: No Abnormality Tenderness on Palpation: Yes Wound Preparation Ulcer Cleansing: Rinsed/Irrigated with Saline Topical Anesthetic Applied: Other: lidocaine 4%, Treatment Notes Wound #2 (Sacrum) 1. Cleansed with: Clean wound with Normal Saline 2. Anesthetic Topical Lidocaine 4% cream to wound bed prior to debridement 4. Dressing Applied: Santyl Ointment 5. Secondary Dressing Applied Bordered Foam Dressing Dry Gauze Electronic Signature(s) Signed: 11/17/2015 4:57:30 PM By: Montey Hora Entered By: Montey Hora on 11/17/2015 11:03:29 [name] ([id number]) -------------------------------------------------------------------------------- Vitals Details Patient Name: [name], Catrell B. Date of Service: 11/17/2015 10:00 AM Medical Record Number: [id number] Patient Account Number: [id number] Date of Birth/Sex: [birth year] (70 y.o. Male) Treating RN: Montey Hora Primary Care Physician: Fulton Reek Other Clinician: Referring Physician: Fulton Reek Treating Physician/Extender: Frann Rider in Treatment: 7 Vital Signs Time Taken: 10:15 Temperature (F): 98.2 Height (in): 68 Pulse (bpm): 79 Weight (lbs): 175 Respiratory Rate (breaths/min): 18 Body Mass Index (BMI): 26.6 Blood Pressure (mmHg): 152/64 Reference Range: 80 - 120 mg / dl Electronic Signature(s) Signed: 11/17/2015 4:57:30 PM By: Montey Hora Entered By: Montey Hora on 11/17/2015 10:15:21

## 2015-11-18 NOTE — Progress Notes (Signed)
[name], [name] ([id number]) Visit Report for 11/17/2015 Chief Complaint Document Details Patient Name: [name], [name] Date of Service: 11/17/2015 10:00 AM Medical Record Number: [id number] Patient Account Number: [id number] Date of Birth/Sex: 10-28-1945 (70 y.o. Male) Treating RN: Montey Hora Primary Care Physician: Fulton Reek Other Clinician: Referring Physician: Fulton Reek Treating Physician/Extender: Frann Rider in Treatment: 7 Information Obtained from: Patient Chief Complaint Patient presents to the wound care center for a consult due non healing wound for about 4 months now Electronic Signature(s) Signed: 11/17/2015 10:47:47 AM By: Christin Fudge MD, FACS Entered By: Christin Fudge on 11/17/2015 10:47:46 [name] ([id number]) -------------------------------------------------------------------------------- Debridement Details Patient Name: [name], [name] Date of Service: 11/17/2015 10:00 AM Medical Record Number: [id number] Patient Account Number: [id number] Date of Birth/Sex: [birth year] (70 y.o. Male) Treating RN: Montey Hora Primary Care Physician: Fulton Reek Other Clinician: Referring Physician: Fulton Reek Treating Physician/Extender: Frann Rider in Treatment: 7 Debridement Performed for Wound #2 Sacrum Assessment: Performed By: Physician Christin Fudge, MD Debridement: Debridement Pre-procedure Yes Verification/Time Out Taken: Start Time: 10:37 Pain Control: Lidocaine 4% Topical Solution Level: Skin/Subcutaneous Tissue Total Area Debrided (L x 0.5 (cm) x 0.2 (cm) = 0.1 (cm) W): Tissue and other Viable, Non-Viable, Eschar, Fibrin/Slough, Subcutaneous material debrided: Instrument: Curette Bleeding: Minimum Hemostasis Achieved: Pressure End Time: 10:39 Procedural Pain: 0 Post Procedural Pain: 0 Response to Treatment: Procedure was tolerated well Post Debridement Measurements of Total Wound Length:  (cm) 0.5 Stage: Category/Stage II Width: (cm) 0.2 Depth: (cm) 0.1 Volume: (cm) 0.008 Post Procedure Diagnosis Same as Pre-procedure Electronic Signature(s) Signed: 11/17/2015 10:47:39 AM By: Christin Fudge MD, FACS Signed: 11/17/2015 4:57:30 PM By: Montey Hora Entered By: Christin Fudge on 11/17/2015 10:47:38 [name] ([id number]) -------------------------------------------------------------------------------- HPI Details Patient Name: [name], [name] Date of Service: 11/17/2015 10:00 AM Medical Record Number: [id number] Patient Account Number: [id number] Date of Birth/Sex: 1945/06/15 (70 y.o. Male) Treating RN: Montey Hora Primary Care Physician: Fulton Reek Other Clinician: Referring Physician: Fulton Reek Treating Physician/Extender: Frann Rider in Treatment: 7 History of Present Illness Location: ulcerated areas in the region of the sacrum bilaterally Quality: Patient reports experiencing a shooting pain to affected area(s). Severity: Patient states wound are getting worse. Duration: Patient has had the wound for > 3 months prior to seeking treatment at the wound center Timing: Pain in wound is constant (hurts all the time) Context: The wound would happen gradually Modifying Factors: Other treatment(s) tried include:has been treated by his PCP and has already been on chronic pain medications Associated Signs and Symptoms: Patient reports having difficulty standing for long periods. HPI Description: 70 year old gentleman who was referred to [place] by his PCP Dr. Fulton Reek for decubitus ulcers in the region of the sacrum which are very painful and these had them for about 4 months now. The patient's past medical history includes aortic stenosis, atrial fibrillation, coronary artery disease, CHF, COPD, coronary artery disease, prediabetes, peripheral vascular disease, nicotine addiction. In the past he's had carotid artery angioplasty and  coronary artery bypass graft and VSD repair. He now has a chronic hip condition which is causing him a lot of pain and he goes to a pain clinic for a long while for this. He's been a smoker for over 40 years and smokes about a pack of cigarettes a day. His last hemoglobin A1c done in February of this year was 5.7. 10/06/2015 -- he continues to have a lot of pain around his decubitus ulcers and other than that has been  trying his best to offload. He is also working on his smoking cessation. Electronic Signature(s) Signed: 11/17/2015 10:47:51 AM By: Christin Fudge MD, FACS Entered By: Christin Fudge on 11/17/2015 10:47:51 [name] ([id number]) -------------------------------------------------------------------------------- Physical Exam Details Patient Name: [name], [name] Date of Service: 11/17/2015 10:00 AM Medical Record Number: [id number] Patient Account Number: [id number] Date of Birth/Sex: 02/04/1946 (70 y.o. Male) Treating RN: Montey Hora Primary Care Physician: Fulton Reek Other Clinician: Referring Physician: Fulton Reek Treating Physician/Extender: Frann Rider in Treatment: 7 Constitutional . Pulse regular. Respirations normal and unlabored. Afebrile. . Eyes Nonicteric. Reactive to light. Ears, Nose, Mouth, and Throat Lips, teeth, and gums WNL.Marland Kitchen Moist mucosa without lesions. Neck supple and nontender. No palpable supraclavicular or cervical adenopathy. Normal sized without goiter. Respiratory WNL. No retractions.. Breath sounds WNL, No rubs, rales, rhonchi, or wheeze.. Cardiovascular Heart rhythm and rate regular, no murmur or gallop.. Pedal Pulses WNL. No clubbing, cyanosis or edema. Lymphatic No adneopathy. No adenopathy. No adenopathy. Musculoskeletal Adexa without tenderness or enlargement.. Digits and nails w/o clubbing, cyanosis, infection, petechiae, ischemia, or inflammatory conditions.. Integumentary (Hair, Skin) No suspicious  lesions. No crepitus or fluctuance. No peri-wound warmth or erythema. No masses.Marland Kitchen Psychiatric Judgement and insight Intact.. No evidence of depression, anxiety, or agitation.. Notes area in the mid sacral region continues to have a lot of subcutaneous debris and I have done some sharp debridement with a #3 curet and minimal bleeding controlled with pressure Electronic Signature(s) Signed: 11/17/2015 10:48:20 AM By: Christin Fudge MD, FACS Entered By: Christin Fudge on 11/17/2015 10:48:20 [name] ([id number]) -------------------------------------------------------------------------------- Physician Orders Details Patient Name: [name], Elvis B. Date of Service: 11/17/2015 10:00 AM Medical Record Number: [id number] Patient Account Number: [id number] Date of Birth/Sex: Sep 01, 1945 (70 y.o. Male) Treating RN: Montey Hora Primary Care Physician: Fulton Reek Other Clinician: Referring Physician: Fulton Reek Treating Physician/Extender: Frann Rider in Treatment: 7 Verbal / Phone Orders: Yes Clinician: Montey Hora Read Back and Verified: Yes Diagnosis Coding Wound Cleansing Wound #2 Sacrum o Clean wound with Normal Saline. o Cleanse wound with mild soap and water o May Shower, gently pat wound dry prior to applying new dressing. Skin Barriers/Peri-Wound Care Wound #2 Sacrum o Skin Prep Primary Wound Dressing Wound #2 Sacrum o Santyl Ointment Secondary Dressing Wound #2 Sacrum o Boardered Foam Dressing Dressing Change Frequency Wound #2 Sacrum o Change dressing every day. Follow-up Appointments Wound #2 Sacrum o Return Appointment in 1 week. Off-Loading Wound #2 Sacrum o Turn and reposition every 2 hours Additional Orders / Instructions Wound #2 Sacrum o Stop Smoking o Increase protein intake. BRADLEIGH, DELLY ([id number]) Medications-please add to medication list. Wound #2 Sacrum o Santyl Enzymatic Ointment o  Other: - Vitamin C, Zinc, Multivitamins Electronic Signature(s) Signed: 11/17/2015 4:20:18 PM By: Christin Fudge MD, FACS Signed: 11/17/2015 4:57:30 PM By: Montey Hora Entered By: Montey Hora on 11/17/2015 10:41:27 [name] ([id number]) -------------------------------------------------------------------------------- Problem List Details Patient Name: [name], Felipe B. Date of Service: 11/17/2015 10:00 AM Medical Record Number: [id number] Patient Account Number: [id number] Date of Birth/Sex: 02-02-1946 (70 y.o. 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 Date of Service: 11/17/2015 10:00 AM Medical Record Number: [id number] Patient Account Number: [id number] Date of Birth/Sex: Jul 10, 1945 (70 y.o. Male) Treating RN: Montey Hora Primary Care Physician: Fulton Reek Other Clinician: Referring Physician: Fulton Reek Treating Physician/Extender: Frann Rider in Treatment: 7 Subjective Chief Complaint Information obtained from Patient Patient presents to the wound care center for a consult due non healing wound for about 4 months now History of  Present Illness (HPI) The following HPI elements were documented for the patient's wound: Location: ulcerated areas in the region of the sacrum bilaterally Quality: Patient reports experiencing a shooting pain to affected area(s). Severity: Patient states wound are getting worse. Duration: Patient has had the wound for > 3 months prior to seeking treatment at the wound center Timing: Pain in wound is constant (hurts all the time) Context: The wound would happen gradually Modifying Factors: Other treatment(s) tried include:has been treated by his PCP and has already been on chronic pain medications Associated Signs and Symptoms: Patient reports having difficulty standing for long periods. 70 year old gentleman who was referred to [place] by his PCP Dr. Fulton Reek for decubitus ulcers in the region of the sacrum which are very painful and these had them for about 4 months now. The patient's past medical history includes aortic stenosis, atrial fibrillation, coronary artery disease, CHF, COPD, coronary artery disease, prediabetes, peripheral vascular disease, nicotine addiction. In the past he's had carotid artery angioplasty and coronary artery bypass graft and VSD repair. He now has a chronic hip condition which is causing him a lot of pain and he goes to a pain clinic for a long while for this. He's been a smoker for over 40 years and smokes about a pack of cigarettes a day. His last hemoglobin A1c done in February of this year was 5.7. 10/06/2015 -- he continues to have a lot of pain around his decubitus ulcers and other than that has been trying his best to offload. He is also working on his smoking cessation. Objective Constitutional Pulse regular. Respirations normal and unlabored. Afebrile. Vitals Time Taken: 10:15 AM, Height: 68 in, Weight: 175 lbs, BMI: 26.6, Temperature: 98.2 F, Pulse: 79 Biddinger, Jaycee B. ([id number]) bpm, Respiratory Rate: 18 breaths/min, Blood Pressure: 152/64  mmHg. Eyes Nonicteric. Reactive to light. Ears, Nose, Mouth, and Throat Lips, teeth, and gums WNL.Marland Kitchen Moist mucosa without lesions. Neck supple and nontender. No palpable supraclavicular or cervical adenopathy. Normal sized without goiter. Respiratory WNL. No retractions.. Breath sounds WNL, No rubs, rales, rhonchi, or wheeze.. Cardiovascular Heart rhythm and rate regular, no murmur or gallop.. Pedal Pulses WNL. No clubbing, cyanosis or edema. Lymphatic No adneopathy. No adenopathy. No adenopathy. Musculoskeletal Adexa without tenderness or enlargement.. Digits and nails w/o clubbing, cyanosis, infection, petechiae, ischemia, or inflammatory conditions.Marland Kitchen Psychiatric Judgement and insight Intact.. No evidence of depression, anxiety, or agitation.. General Notes: area in the mid sacral region continues to have a lot of subcutaneous debris and I have done some sharp debridement with a #3 curet and minimal bleeding controlled with pressure Integumentary (Hair, Skin) No suspicious lesions. No crepitus or fluctuance. No peri-wound warmth or erythema. No masses.. Wound #2 status is Open. Original cause of wound was Gradually Appeared. The wound is located on the Sacrum. The wound measures 0.5cm length x 0.2cm width x 0.1cm depth; 0.079cm^2 area and 0.008cm^3  volume. The wound is limited to skin breakdown. There is no tunneling or undermining noted. There is a large amount of serous drainage noted. The wound margin is flat and intact. There is large (67-100%) pink granulation within the wound bed. There is a small (1-33%) amount of necrotic tissue within the wound bed including Adherent Slough. The periwound skin appearance exhibited: Moist, Erythema. The surrounding wound skin color is noted with erythema which is circumferential. Periwound temperature was noted as No Abnormality. The periwound has tenderness on palpation. Assessment Maddin, [place] Isador B. ([id number]) Active  Problems ICD-10 L89.153 - Pressure ulcer of sacral region, stage 3 F17.218 - Nicotine dependence, cigarettes, with other nicotine-induced disorders G89.4 - Chronic pain syndrome L89.313 - Pressure ulcer of right buttock, stage 3 Procedures Wound #2 Wound #2 is a Pressure Ulcer located on the Sacrum . There was a Skin/Subcutaneous Tissue Debridement [id number]) debridement with total area of 0.1 sq cm performed by Christin Fudge, MD. with the following instrument(s): Curette to remove Viable and Non-Viable tissue/material including Fibrin/Slough, Eschar, and Subcutaneous after achieving pain control using Lidocaine 4% Topical Solution. A time out was conducted prior to the start of the procedure. A Minimum amount of bleeding was controlled with Pressure. The procedure was tolerated well with a pain level of 0 throughout and a pain level of 0 following the procedure. Post Debridement Measurements: 0.5cm length x 0.2cm width x 0.1cm depth; 0.008cm^3 volume. Post debridement Stage noted as Category/Stage II. Post procedure Diagnosis Wound #2: Same as Pre-Procedure Plan Wound Cleansing: Wound #2 Sacrum: Clean wound with Normal Saline. Cleanse wound with mild soap and water May Shower, gently pat wound dry prior to applying new dressing. Skin Barriers/Peri-Wound Care: Wound #2 Sacrum: Skin Prep Primary Wound Dressing: Wound #2 Sacrum: Santyl Ointment Secondary Dressing: Wound #2 Sacrum: Boardered Foam Dressing Dressing Change Frequency: Wound #2 Sacrum: Change dressing every day. TEDFORD, MCGOWEN ([id number]) Follow-up Appointments: Wound #2 Sacrum: Return Appointment in 1 week. Off-Loading: Wound #2 Sacrum: Turn and reposition every 2 hours Additional Orders / Instructions: Wound #2 Sacrum: Stop Smoking Increase protein intake. Medications-please add to medication list.: Wound #2 Sacrum: Santyl Enzymatic Ointment Other: - Vitamin C, Zinc, Multivitamins I have  recommended: 1. Offloading and details of this have been discussed with him at great lengths 2. Santyl ointment locally and protecting it with a bordered foam dressing. 3. He will come back to see me next week Electronic Signature(s) Signed: 11/17/2015 4:23:10 PM By: Christin Fudge MD, FACS Previous Signature: 11/17/2015 4:22:48 PM Version By: Christin Fudge MD, FACS Previous Signature: 11/17/2015 4:22:31 PM Version By: Christin Fudge MD, FACS Previous Signature: 11/17/2015 10:49:19 AM Version By: Christin Fudge MD, FACS Entered By: Christin Fudge on 11/17/2015 16:23:09 [name] ([id number]) -------------------------------------------------------------------------------- SuperBill Details Patient Name: [name], Arren B. Date of Service: 11/17/2015 Medical Record Number: [id number] Patient Account Number: [id number] Date of Birth/Sex: [birth year] (70 y.o. 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, stage 3 F17.218 Nicotine dependence, cigarettes, with other nicotin G89.4 Chronic pain syndrome L89.313 Pressure  ulcer of right buttock, stage 3 Modifier:  e-induced di Quantity: 1 sorders Physician Procedures CPT4 Code Description: [id number] 11042 - WC PHYS SUBQ TISS 20 SQ CM ICD-10 Description Diagnosis L89.153 Pressure ulcer of sacral region, stage 3 F17.218 Nicotine dependence, cigarettes, with other nicotin G89.4 Chronic pain syndrome  L89.313 Pressure ulcer  of right buttock, stage 3 Modifier: e-induced dis Quantity: 1 orders Electronic Signature(s) Signed: 11/17/2015 10:49:29 AM By: Christin Fudge MD, FACS Entered By: Christin Fudge on 11/17/2015 10:49:29

## 2015-11-24 ENCOUNTER — Encounter: Payer: Medicare Other | Admitting: Surgery

## 2015-11-24 DIAGNOSIS — L89153 Pressure ulcer of sacral region, stage 3: Secondary | ICD-10-CM | POA: Diagnosis not present

## 2015-11-24 NOTE — Progress Notes (Addendum)
YOEL, GILCHREST ([id number]) Visit Report for 11/24/2015 Chief Complaint Document Details Patient Name: [name], [name] Date of Service: 11/24/2015 2:15 PM Medical Record Number: [id number] Patient Account Number: [id number] Date of Birth/Sex: 1945/11/12 (70 y.o. Male) Treating RN: Montey Hora Primary Care Physician: Fulton Reek Other Clinician: Referring Physician: Fulton Reek Treating Physician/Extender: Frann Rider in Treatment: 8 Information Obtained from: Patient Chief Complaint Patient presents to the wound care center for a consult due non healing wound for about 4 months now Electronic Signature(s) Signed: 11/24/2015 2:37:52 PM By: Christin Fudge MD, FACS Entered By: Christin Fudge on 11/24/2015 14:37:52 [name] ([id number]) -------------------------------------------------------------------------------- HPI Details Patient Name: [name], Ashely B. Date of Service: 11/24/2015 2:15 PM Medical Record Number: [id number] Patient Account Number: [id number] Date of Birth/Sex: 1945/06/08 (70 y.o. Male) Treating RN: Montey Hora Primary Care Physician: Fulton Reek Other Clinician: Referring Physician: Fulton Reek Treating Physician/Extender: Frann Rider in Treatment: 8 History of Present Illness Location: ulcerated areas in the region of the sacrum bilaterally Quality: Patient reports experiencing a shooting pain to affected area(s). Severity: Patient states wound are getting worse. Duration: Patient has had the wound for > 3 months prior to seeking treatment at the wound center Timing: Pain in wound is constant (hurts all the time) Context: The wound would happen gradually Modifying Factors: Other treatment(s) tried include:has been treated by his PCP and has already been on chronic pain medications Associated Signs and Symptoms: Patient reports having difficulty standing for long periods. HPI Description: 70 year old  gentleman who was referred to [place] by his PCP Dr. Fulton Reek for decubitus ulcers in the region of the sacrum which are very painful and these had them for about 4 months now. The patient's past medical history includes aortic stenosis, atrial fibrillation, coronary artery disease, CHF, COPD, coronary artery disease, prediabetes, peripheral vascular disease, nicotine addiction. In the past he's had carotid artery angioplasty and coronary artery bypass graft and VSD repair. He now has a chronic hip condition which is causing him a lot of pain and he goes to a pain clinic for a long while for this. He's been a smoker for over 40 years and smokes about a pack of cigarettes a day. His last hemoglobin A1c done in February of this year was 5.7. 10/06/2015 -- he continues to have a lot of pain around his decubitus ulcers and other than that has been trying his best to offload. He is also working on his smoking cessation. Electronic Signature(s) Signed: 11/24/2015 2:37:57 PM By: Christin Fudge MD, FACS Entered By: Christin Fudge on 11/24/2015 14:37:57 [name] ([id number]) -------------------------------------------------------------------------------- Physical Exam Details Patient Name: [name], [name] Date of Service: 11/24/2015 2:15 PM Medical Record Number: [id number] Patient Account Number: [id number] Date of Birth/Sex: [birth year] (70 y.o. Male) Treating RN: Montey Hora Primary Care Physician: Fulton Reek Other Clinician: Referring Physician: Fulton Reek Treating Physician/Extender: Frann Rider in Treatment: 8 Constitutional . Pulse regular. Respirations normal and unlabored. Afebrile. . Eyes Nonicteric. Reactive to light. Ears, Nose, Mouth, and Throat Lips, teeth, and gums WNL.Marland Kitchen Moist mucosa without lesions. Neck supple and nontender. No palpable supraclavicular or cervical adenopathy. Normal sized without goiter. Respiratory WNL. No  retractions.. Cardiovascular Pedal Pulses WNL. No clubbing, cyanosis or edema. Lymphatic No adneopathy. No adenopathy. No adenopathy. Musculoskeletal Adexa without tenderness or enlargement.. Digits and nails w/o clubbing, cyanosis, infection, petechiae, ischemia, or inflammatory conditions.. Integumentary (Hair, Skin) No suspicious lesions. No crepitus or fluctuance. No peri-wound warmth or erythema. No masses.Marland Kitchen Psychiatric Judgement  and insight Intact.. No evidence of depression, anxiety, or agitation.. Notes the wound is looking much better than last week and no debridement was required today. We will continue with Santyl ointment locally. Electronic Signature(s) Signed: 11/24/2015 2:38:28 PM By: Christin Fudge MD, FACS Entered By: Christin Fudge on 11/24/2015 14:38:28 [name] ([id number]) -------------------------------------------------------------------------------- Physician Orders Details Patient Name: [name], Hogan B. Date of Service: 11/24/2015 2:15 PM Medical Record Number: [id number] Patient Account Number: [id number] Date of Birth/Sex: 1946-01-16 (70 y.o. Male) Treating RN: Montey Hora Primary Care Physician: Fulton Reek Other Clinician: Referring Physician: Fulton Reek Treating Physician/Extender: Frann Rider in Treatment: 8 Verbal / Phone Orders: Yes Clinician: Montey Hora Read Back and Verified: Yes Diagnosis Coding Wound Cleansing Wound #2 Sacrum o Clean wound with Normal Saline. o Cleanse wound with mild soap and water o May Shower, gently pat wound dry prior to applying new dressing. Skin Barriers/Peri-Wound Care Wound #2 Sacrum o Skin Prep Primary Wound Dressing Wound #2 Sacrum o Santyl Ointment Secondary Dressing Wound #2 Sacrum o Boardered Foam Dressing Dressing Change Frequency Wound #2 Sacrum o Change dressing every day. Follow-up Appointments Wound #2 Sacrum o Return Appointment in 1  week. Off-Loading Wound #2 Sacrum o Turn and reposition every 2 hours Additional Orders / Instructions Wound #2 Sacrum o Stop Smoking o Increase protein intake. RICHRD, DA ([id number]) Medications-please add to medication list. Wound #2 Sacrum o Santyl Enzymatic Ointment o Other: - Vitamin C, Zinc, Multivitamins Electronic Signature(s) Signed: 11/24/2015 4:24:16 PM By: Christin Fudge MD, FACS Signed: 11/24/2015 4:29:04 PM By: Montey Hora Entered By: Montey Hora on 11/24/2015 14:36:42 [name] ([id number]) -------------------------------------------------------------------------------- Problem List Details Patient Name: [name], Zolton B. Date of Service: 11/24/2015 2:15 PM Medical Record Number: [id number] Patient Account Number: [id number] Date of Birth/Sex: August 18, 1945 (70 y.o. Male) Treating RN: Montey Hora Primary Care Physician: Fulton Reek Other Clinician: Referring Physician: Fulton Reek Treating Physician/Extender: Frann Rider in Treatment: 8 Active Problems ICD-10 Encounter Code Description Active Date Diagnosis L89.153 Pressure ulcer of sacral region, stage 3 09/29/2015 Yes F17.218 Nicotine dependence, cigarettes, with other nicotine- 09/29/2015 Yes induced disorders G89.4 Chronic pain syndrome 09/29/2015 Yes L89.313 Pressure ulcer of right buttock, stage 3 10/20/2015 Yes Inactive Problems Resolved Problems Electronic Signature(s) Signed: 11/24/2015 2:37:46 PM By: Christin Fudge MD, FACS Entered By: Christin Fudge on 11/24/2015 14:37:45 [name] ([id number]) -------------------------------------------------------------------------------- Progress Note Details Patient Name: [name], Jacky B. Date of Service: 11/24/2015 2:15 PM Medical Record Number: [id number] Patient Account Number: [id number] Date of Birth/Sex: [birth year] (70 y.o. Male) Treating RN: Montey Hora Primary Care Physician: Fulton Reek Other Clinician: Referring Physician: Fulton Reek Treating Physician/Extender: Frann Rider in Treatment: 8 Subjective Chief Complaint Information obtained from Patient Patient presents to the wound care center for a consult due non healing wound for about 4 months now History of Present Illness (HPI) The following HPI elements were documented for the patient's wound: Location: ulcerated areas in the region of the sacrum bilaterally Quality: Patient reports experiencing a shooting pain to affected area(s). Severity: Patient states wound are getting worse. Duration: Patient has had the wound for > 3 months prior to seeking treatment at the wound center Timing: Pain in wound is constant (hurts all the time) Context: The wound would happen gradually Modifying Factors: Other treatment(s) tried include:has been treated by his PCP and has already been on chronic pain medications Associated Signs and Symptoms: Patient reports having difficulty standing for long periods. 70 year old gentleman who was referred to [place]  by his PCP Dr. Fulton Reek for decubitus ulcers in the region of the sacrum which are very painful and these had them for about 4 months now. The patient's past medical history includes aortic stenosis, atrial fibrillation, coronary artery disease, CHF, COPD, coronary artery disease, prediabetes, peripheral vascular disease, nicotine addiction. In the past he's had carotid artery angioplasty and coronary artery bypass graft and VSD repair. He now has a chronic hip condition which is causing him a lot of pain and he goes to a pain clinic for a long while for this. He's been a smoker for over 40 years and smokes about a pack of cigarettes a day. His last hemoglobin A1c done in February of this year was 5.7. 10/06/2015 -- he continues to have a lot of pain around his decubitus ulcers and other than that has been trying his best to offload. He is also working on his  smoking cessation. Objective Constitutional Pulse regular. Respirations normal and unlabored. Afebrile. Vitals Time Taken: 2:27 PM, Height: 68 in, Weight: 175 lbs, BMI: 26.6, Temperature: 98.5 F, Pulse: 83 Wilkie, Lendon B. ([id number]) bpm, Respiratory Rate: 18 breaths/min, Blood Pressure: 130/65 mmHg. Eyes Nonicteric. Reactive to light. Ears, Nose, Mouth, and Throat Lips, teeth, and gums WNL.Marland Kitchen Moist mucosa without lesions. Neck supple and nontender. No palpable supraclavicular or cervical adenopathy. Normal sized without goiter. Respiratory WNL. No retractions.. Cardiovascular Pedal Pulses WNL. No clubbing, cyanosis or edema. Lymphatic No adneopathy. No adenopathy. No adenopathy. Musculoskeletal Adexa without tenderness or enlargement.. Digits and nails w/o clubbing, cyanosis, infection, petechiae, ischemia, or inflammatory conditions.Marland Kitchen Psychiatric Judgement and insight Intact.. No evidence of depression, anxiety, or agitation.. General Notes: the wound is looking much better than last week and no debridement was required today. We will continue with Santyl ointment locally. Integumentary (Hair, Skin) No suspicious lesions. No crepitus or fluctuance. No peri-wound warmth or erythema. No masses.. Wound #2 status is Open. Original cause of wound was Gradually Appeared. The wound is located on the Sacrum. The wound measures 0.6cm length x 0.2cm width x 0.1cm depth; 0.094cm^2 area and 0.009cm^3 volume. The wound is limited to skin breakdown. There is no tunneling or undermining noted. There is a large amount of serous drainage noted. The wound margin is flat and intact. There is large (67-100%) pink granulation within the wound bed. There is a small (1-33%) amount of necrotic tissue within the wound bed including Adherent Slough. The periwound skin appearance exhibited: Moist, Erythema. The surrounding wound skin color is noted with erythema which is circumferential. Periwound  temperature was noted as No Abnormality. The periwound has tenderness on palpation. Assessment RAMI, CHAPP B. ([id number]) Active Problems ICD-10 L89.153 - Pressure ulcer of sacral region, stage 3 F17.218 - Nicotine dependence, cigarettes, with other nicotine-induced disorders G89.4 - Chronic pain syndrome L89.313 - Pressure ulcer of right buttock, stage 3 Plan Wound Cleansing: Wound #2 Sacrum: Clean wound with Normal Saline. Cleanse wound with mild soap and water May Shower, gently pat wound dry prior to applying new dressing. Skin Barriers/Peri-Wound Care: Wound #2 Sacrum: Skin Prep Primary Wound Dressing: Wound #2 Sacrum: Santyl Ointment Secondary Dressing: Wound #2 Sacrum: Boardered Foam Dressing Dressing Change Frequency: Wound #2 Sacrum: Change dressing every day. Follow-up Appointments: Wound #2 Sacrum: Return Appointment in 1 week. Off-Loading: Wound #2 Sacrum: Turn and reposition every 2 hours Additional Orders / Instructions: Wound #2 Sacrum: Stop Smoking Increase protein intake. Medications-please add to medication list.: Wound #2 Sacrum: Santyl Enzymatic Ointment Other: - Vitamin C, Zinc, Multivitamins Cassatt,  Jerrold B. ([id number]) I have recommended: 1. Offloading and details of this have been discussed with him at great lengths 2. Santyl ointment locally and protecting it with a bordered foam dressing. 3. He will come back to see me next week Electronic Signature(s) Signed: 11/24/2015 4:29:45 PM By: Christin Fudge MD, FACS Previous Signature: 11/24/2015 2:38:59 PM Version By: Christin Fudge MD, FACS Entered By: Christin Fudge on 11/24/2015 16:29:45 [name] ([id number]) -------------------------------------------------------------------------------- SuperBill Details Patient Name: [name], Juston B. Date of Service: 11/24/2015 Medical Record Number: [id number] Patient Account Number: [id number] Date of Birth/Sex: 10/31/1945 (70 y.o.  Male) Treating RN: Montey Hora Primary Care Physician: Fulton Reek Other Clinician: Referring Physician: Fulton Reek Treating Physician/Extender: Frann Rider in Treatment: 8 Diagnosis Coding ICD-10 Codes Code Description [phone] Pressure ulcer of sacral region, stage 3 F17.218 Nicotine dependence, cigarettes, with other nicotine-induced disorders G89.4 Chronic pain syndrome L89.313 Pressure ulcer of right buttock, stage 3 Physician Procedures CPT4 Code Description: [id number] - WC PHYS LEVEL 3 - EST PT ICD-10 Description Diagnosis L89.153 Pressure ulcer of sacral region, stage 3 F17.218 Nicotine dependence, cigarettes, with other nicoti G89.4 Chronic pain syndrome L89.313 Pressure ulcer of  right buttock, stage 3 Modifier: ne-induced dis Quantity: 1 orders Electronic Signature(s) Signed: 11/24/2015 2:39:11 PM By: Christin Fudge MD, FACS Entered By: Christin Fudge on 11/24/2015 14:39:11

## 2015-11-24 NOTE — Progress Notes (Signed)
[name], [name] ([id number]) Visit Report for 11/24/2015 Arrival Information Details Patient Name: [name], [name] Date of Service: 11/24/2015 2:15 PM Medical Record Number: [id number] Patient Account Number: [id number] Date of Birth/Sex: [birth year] (70 y.o. Male) Treating RN: Montey Hora Primary Care Physician: Fulton Reek Other Clinician: Referring Physician: Fulton Reek Treating Physician/Extender: Frann Rider in Treatment: 8 Visit Information History Since Last Visit Added or deleted any medications: No Patient Arrived: Ambulatory Any new allergies or adverse reactions: No Arrival Time: 14:22 Had a fall or experienced change in No Accompanied By: spouse activities of daily living that may affect Transfer Assistance: None risk of falls: Patient Identification Verified: Yes Signs or symptoms of abuse/neglect since last No Secondary Verification Process Yes visito Completed: Hospitalized since last visit: No Patient Requires Transmission-Based No Pain Present Now: No Precautions: Patient Has Alerts: No Electronic Signature(s) Signed: 11/24/2015 4:29:04 PM By: Montey Hora Entered By: Montey Hora on 11/24/2015 14:27:10 [name] ([id number]) -------------------------------------------------------------------------------- Clinic Level of Care Assessment Details Patient Name: [name], [name] Date of Service: 11/24/2015 2:15 PM Medical Record Number: [id number] Patient Account Number: [id number] Date of Birth/Sex: 06-Nov-1945 (70 y.o. Male) Treating RN: Montey Hora Primary Care Physician: Fulton Reek Other Clinician: Referring Physician: Fulton Reek Treating Physician/Extender: Frann Rider in Treatment: 8 Clinic Level of Care Assessment Items TOOL 4 Quantity Score []  - Use when only an EandM is performed on FOLLOW-UP visit 0 ASSESSMENTS - Nursing Assessment / Reassessment X - Reassessment of Co-morbidities  (includes updates in patient status) 1 10 X - Reassessment of Adherence to Treatment Plan 1 5 ASSESSMENTS - Wound and Skin Assessment / Reassessment X - Simple Wound Assessment / Reassessment - one wound 1 5 []  - Complex Wound Assessment / Reassessment - multiple wounds 0 []  - Dermatologic / Skin Assessment (not related to wound area) 0 ASSESSMENTS - Focused Assessment []  - Circumferential Edema Measurements - multi extremities 0 []  - Nutritional Assessment / Counseling / Intervention 0 []  - Lower Extremity Assessment (monofilament, tuning fork, pulses) 0 []  - Peripheral Arterial Disease Assessment (using hand held doppler) 0 ASSESSMENTS - Ostomy and/or Continence Assessment and Care []  - Incontinence Assessment and Management 0 []  - Ostomy Care Assessment and Management (repouching, etc.) 0 PROCESS - Coordination of Care X - Simple Patient / Family Education for ongoing care 1 15 []  - Complex (extensive) Patient / Family Education for ongoing care 0 []  - Staff obtains Programmer, systems, Records, Test Results / Process Orders 0 []  - Staff telephones HHA, Nursing Homes / Clarify orders / etc 0 []  - Routine Transfer to another Facility (non-emergent condition) 0 [place], [place] ([id number]) []  - Routine Hospital Admission (non-emergent condition) 0 []  - New Admissions / Biomedical engineer / Ordering NPWT, Apligraf, etc. 0 []  - Emergency Hospital Admission (emergent condition) 0 X - Simple Discharge Coordination 1 10 []  - Complex (extensive) Discharge Coordination 0 PROCESS - Special Needs []  - Pediatric / Minor Patient Management 0 []  - Isolation Patient Management 0 []  - Hearing / Language / Visual special needs 0 []  - Assessment of Community assistance (transportation, D/C planning, etc.) 0 []  - Additional assistance / Altered mentation 0 []  - Support Surface(s) Assessment (bed, cushion, seat, etc.) 0 INTERVENTIONS - Wound Cleansing / Measurement X - Simple Wound Cleansing - one  wound 1 5 []  - Complex Wound Cleansing - multiple wounds 0 X - Wound Imaging (photographs - any number of wounds) 1 5 []  - Wound Tracing (instead of photographs) 0 X -  Simple Wound Measurement - one wound 1 5 []  - Complex Wound Measurement - multiple wounds 0 INTERVENTIONS - Wound Dressings X - Small Wound Dressing one or multiple wounds 1 10 []  - Medium Wound Dressing one or multiple wounds 0 []  - Large Wound Dressing one or multiple wounds 0 []  - Application of Medications - topical 0 []  - Application of Medications - injection 0 INTERVENTIONS - Miscellaneous []  - External ear exam 0 Bence, Sander B. ([id number]) []  - Specimen Collection (cultures, biopsies, blood, body fluids, etc.) 0 []  - Specimen(s) / Culture(s) sent or taken to Lab for analysis 0 []  - Patient Transfer (multiple staff / Harrel Lemon Lift / Similar devices) 0 []  - Simple Staple / Suture removal (25 or less) 0 []  - Complex Staple / Suture removal (26 or more) 0 []  - Hypo / Hyperglycemic Management (close monitor of Blood Glucose) 0 []  - Ankle / Brachial Index (ABI) - do not check if billed separately 0 X - Vital Signs 1 5 Has the patient been seen at the hospital within the last three years: Yes Total Score: 75 Level Of Care: New/Established - Level 2 Electronic Signature(s) Signed: 11/24/2015 4:29:04 PM By: Montey Hora Entered By: Montey Hora on 11/24/2015 14:41:15 [name] ([id number]) -------------------------------------------------------------------------------- Encounter Discharge Information Details Patient Name: [name], [name] Date of Service: 11/24/2015 2:15 PM Medical Record Number: [id number] Patient Account Number: [id number] Date of Birth/Sex: 1945-11-02 (70 y.o. Male) Treating RN: Montey Hora Primary Care Physician: Fulton Reek Other Clinician: Referring Physician: Fulton Reek Treating Physician/Extender: Frann Rider in Treatment: 8 Encounter Discharge  Information Items Discharge Pain Level: 0 Discharge Condition: Stable Ambulatory Status: Ambulatory Discharge Destination: Home Transportation: Private Auto Accompanied By: spouse Schedule Follow-up Appointment: Yes Medication Reconciliation completed and provided to Patient/Care No Shelle Galdamez: Provided on Clinical Summary of Care: 11/24/2015 Form Type Recipient Paper Patient PB Electronic Signature(s) Signed: 11/24/2015 2:44:57 PM By: Ruthine Dose Entered By: Ruthine Dose on 11/24/2015 14:44:57 [name] ([id number]) -------------------------------------------------------------------------------- Multi Wound Chart Details Patient Name: [name], Derell B. Date of Service: 11/24/2015 2:15 PM Medical Record Number: [id number] Patient Account Number: [id number] Date of Birth/Sex: 08-20-1945 (70 y.o. Male) Treating RN: Montey Hora Primary Care Physician: Fulton Reek Other Clinician: Referring Physician: Fulton Reek Treating Physician/Extender: Frann Rider in Treatment: 8 Vital Signs Height(in): 68 Pulse(bpm): 83 Weight(lbs): 175 Blood Pressure 130/65 (mmHg): Body Mass Index(BMI): 27 Temperature(F): 98.5 Respiratory Rate 18 (breaths/min): Photos: [2:No Photos] [N/A:N/A] Wound Location: [2:Sacrum] [N/A:N/A] Wounding Event: [2:Gradually Appeared] [N/A:N/A] Primary Etiology: [2:Pressure Ulcer] [N/A:N/A] Comorbid History: [2:Chronic Obstructive Pulmonary Disease (COPD), Arrhythmia, Congestive Heart Failure, Coronary Artery Disease, Hypertension, Peripheral Venous Disease, Osteoarthritis] [N/A:N/A] Date Acquired: [2:06/01/2015] [N/A:N/A] Weeks of Treatment: [2:8] [N/A:N/A] Wound Status: [2:Open] [N/A:N/A] Measurements L x W x D 0.6x0.2x0.1 [N/A:N/A] (cm) Area (cm) : [2:0.094] [N/A:N/A] Volume (cm) : [2:0.009] [N/A:N/A] % Reduction in Area: [2:99.40%] [N/A:N/A] % Reduction in Volume: 99.40% [N/A:N/A] Classification: [2:Category/Stage II]  [N/A:N/A] Exudate Amount: [2:Large] [N/A:N/A] Exudate Type: [2:Serous] [N/A:N/A] Exudate Color: [2:amber] [N/A:N/A] Wound Margin: [2:Flat and Intact] [N/A:N/A] Granulation Amount: [2:Large (67-100%)] [N/A:N/A] Granulation Quality: [2:Pink] [N/A:N/A] Necrotic Amount: [2:Small (1-33%)] [N/A:N/A] Exposed Structures: [N/A:N/A] Fascia: No Fat: No Tendon: No Muscle: No Joint: No Bone: No Limited to Skin Breakdown Epithelialization: Medium (34-66%) N/A N/A Periwound Skin Texture: No Abnormalities Noted N/A N/A Periwound Skin Moist: Yes N/A N/A Moisture: Periwound Skin Color: Erythema: Yes N/A N/A Erythema Location: Circumferential N/A N/A Temperature: No Abnormality N/A N/A Tenderness on Yes N/A N/A Palpation: Wound Preparation:  Ulcer Cleansing: N/A N/A Rinsed/Irrigated with Saline Topical Anesthetic Applied: Other: lidocaine 4% Treatment Notes Electronic Signature(s) Signed: 11/24/2015 4:29:04 PM By: Montey Hora Entered By: Montey Hora on 11/24/2015 14:36:19 [name] ([id number]) -------------------------------------------------------------------------------- [place] Details Patient Name: [name], Diontre B. Date of Service: 11/24/2015 2:15 PM Medical Record Number: [id number] Patient Account Number: [id number] Date of Birth/Sex: 13-Oct-1945 (70 y.o. Male) Treating RN: Montey Hora Primary Care Physician: Fulton Reek Other Clinician: Referring Physician: Fulton Reek Treating Physician/Extender: Frann Rider in Treatment: 8 Active Inactive Orientation to the Wound Care Program Nursing Diagnoses: Knowledge deficit related to the wound healing center program Goals: Patient/caregiver will verbalize understanding of the [place] Program Date Initiated: 10/20/2015 Goal Status: Active Interventions: Provide education on orientation to the wound center Notes: Pressure Nursing Diagnoses: Knowledge deficit  related to management of pressures ulcers Goals: Patient will remain free from development of additional pressure ulcers Date Initiated: 10/20/2015 Goal Status: Active Interventions: Assess: immobility, friction, shearing, incontinence upon admission and as needed Notes: Electronic Signature(s) Signed: 11/24/2015 4:29:04 PM By: Montey Hora Entered By: Montey Hora on 11/24/2015 [place], [place]. ([id number]) -------------------------------------------------------------------------------- Pain Assessment Details Patient Name: [name], Noelle B. Date of Service: 11/24/2015 2:15 PM Medical Record Number: [id number] Patient Account Number: [id number] Date of Birth/Sex: Sep 16, 1945 (71 y.o. Male) Treating RN: Montey Hora Primary Care Physician: Fulton Reek Other Clinician: Referring Physician: Fulton Reek Treating Physician/Extender: Frann Rider in Treatment: 8 Active Problems Location of Pain Severity and Description of Pain Patient Has Paino No Site Locations Pain Management and Medication Current Pain Management: Electronic Signature(s) Signed: 11/24/2015 4:29:04 PM By: Montey Hora Entered By: Montey Hora on 11/24/2015 14:27:17 [name] ([id number]) -------------------------------------------------------------------------------- Patient/Caregiver Education Details Patient Name: [name], Guled B. Date of Service: 11/24/2015 2:15 PM Medical Record Number: [id number] Patient Account Number: [id number] Date of Birth/Gender: Oct 13, 1945 (70 y.o. Male) Treating RN: Montey Hora Primary Care Physician: Fulton Reek Other Clinician: Referring Physician: Fulton Reek Treating Physician/Extender: Frann Rider in Treatment: 8 Education Assessment Education Provided To: Patient and Caregiver Education Topics Provided Wound/Skin Impairment: Handouts: Other: wound care to continue as ordered Methods: Demonstration,  Explain/Verbal Responses: State content correctly Electronic Signature(s) Signed: 11/24/2015 4:29:04 PM By: Montey Hora Entered By: Montey Hora on 11/24/2015 14:44:55 [name] ([id number]) -------------------------------------------------------------------------------- Wound Assessment Details Patient Name: [name], Tripton B. Date of Service: 11/24/2015 2:15 PM Medical Record Number: [id number] Patient Account Number: [id number] Date of Birth/Sex: [birth year] (70 y.o. Male) Treating RN: Montey Hora Primary Care Physician: Fulton Reek Other Clinician: Referring Physician: Fulton Reek Treating Physician/Extender: Frann Rider in Treatment: 8 Wound Status Wound Number: 2 Primary Pressure Ulcer Etiology: Wound Location: Sacrum Wound Open Wounding Event: Gradually Appeared Status: Date Acquired: 06/01/2015 Comorbid Chronic Obstructive Pulmonary Disease Weeks Of Treatment: 8 History: (COPD), Arrhythmia, Congestive Heart Clustered Wound: No Failure, Coronary Artery Disease, Hypertension, Peripheral Venous Disease, Osteoarthritis Photos Wound Measurements Length: (cm) 0.6 Width: (cm) 0.2 Depth: (cm) 0.1 Area: (cm) 0.094 Volume: (cm) 0.009 % Reduction in Area: 99.4% % Reduction in Volume: 99.4% Epithelialization: Medium (34-66%) Tunneling: No Undermining: No Wound Description Classification: Category/Stage II Wound Margin: Flat and Intact Exudate Amount: Large Exudate Type: Serous Exudate Color: amber Foul Odor After Cleansing: No Wound Bed Granulation Amount: Large (67-100%) Exposed Structure Granulation Quality: Pink Fascia Exposed: No Necrotic Amount: Small (1-33%) Fat Layer Exposed: No Feazell, Nickalos B. ([id number]) Necrotic Quality: Adherent Slough Tendon Exposed: No Muscle Exposed: No Joint Exposed: No Bone Exposed: No Limited to Skin Breakdown  Periwound Skin Texture Texture Color No Abnormalities Noted: No No  Abnormalities Noted: No Erythema: Yes Moisture Erythema Location: Circumferential No Abnormalities Noted: No Moist: Yes Temperature / Pain Temperature: No Abnormality Tenderness on Palpation: Yes Wound Preparation Ulcer Cleansing: Rinsed/Irrigated with Saline Topical Anesthetic Applied: Other: lidocaine 4%, Treatment Notes Wound #2 (Sacrum) 1. Cleansed with: Clean wound with Normal Saline 2. Anesthetic Topical Lidocaine 4% cream to wound bed prior to debridement 4. Dressing Applied: Santyl Ointment 5. Secondary Dressing Applied Bordered Foam Dressing Dry Gauze Electronic Signature(s) Signed: 11/24/2015 4:29:04 PM By: Montey Hora Entered By: Montey Hora on 11/24/2015 15:18:48 [name] ([id number]) -------------------------------------------------------------------------------- Vitals Details Patient Name: [name], Michele B. Date of Service: 11/24/2015 2:15 PM Medical Record Number: [id number] Patient Account Number: [id number] Date of Birth/Sex: [birth year] (70 y.o. Male) Treating RN: Montey Hora Primary Care Physician: Fulton Reek Other Clinician: Referring Physician: Fulton Reek Treating Physician/Extender: Frann Rider in Treatment: 8 Vital Signs Time Taken: 14:27 Temperature (F): 98.5 Height (in): 68 Pulse (bpm): 83 Weight (lbs): 175 Respiratory Rate (breaths/min): 18 Body Mass Index (BMI): 26.6 Blood Pressure (mmHg): 130/65 Reference Range: 80 - 120 mg / dl Electronic Signature(s) Signed: 11/24/2015 4:29:04 PM By: Montey Hora Entered By: Montey Hora on 11/24/2015 14:27:37

## 2015-12-01 ENCOUNTER — Encounter: Payer: Medicare Other | Attending: Surgery | Admitting: Surgery

## 2015-12-01 DIAGNOSIS — Z880 Allergy status to penicillin: Secondary | ICD-10-CM | POA: Insufficient documentation

## 2015-12-01 DIAGNOSIS — L89153 Pressure ulcer of sacral region, stage 3: Secondary | ICD-10-CM | POA: Diagnosis not present

## 2015-12-01 DIAGNOSIS — I2510 Atherosclerotic heart disease of native coronary artery without angina pectoris: Secondary | ICD-10-CM | POA: Diagnosis not present

## 2015-12-01 DIAGNOSIS — G894 Chronic pain syndrome: Secondary | ICD-10-CM | POA: Diagnosis not present

## 2015-12-01 DIAGNOSIS — L89313 Pressure ulcer of right buttock, stage 3: Secondary | ICD-10-CM | POA: Insufficient documentation

## 2015-12-01 DIAGNOSIS — I509 Heart failure, unspecified: Secondary | ICD-10-CM | POA: Diagnosis not present

## 2015-12-01 DIAGNOSIS — I350 Nonrheumatic aortic (valve) stenosis: Secondary | ICD-10-CM | POA: Diagnosis not present

## 2015-12-01 DIAGNOSIS — J449 Chronic obstructive pulmonary disease, unspecified: Secondary | ICD-10-CM | POA: Insufficient documentation

## 2015-12-01 DIAGNOSIS — I110 Hypertensive heart disease with heart failure: Secondary | ICD-10-CM | POA: Insufficient documentation

## 2015-12-01 DIAGNOSIS — F17218 Nicotine dependence, cigarettes, with other nicotine-induced disorders: Secondary | ICD-10-CM | POA: Diagnosis not present

## 2015-12-01 DIAGNOSIS — I4891 Unspecified atrial fibrillation: Secondary | ICD-10-CM | POA: Diagnosis not present

## 2015-12-01 DIAGNOSIS — I739 Peripheral vascular disease, unspecified: Secondary | ICD-10-CM | POA: Insufficient documentation

## 2015-12-01 NOTE — Progress Notes (Signed)
ANKUSH, WIDRIG ([id number]) Visit Report for 12/01/2015 Chief Complaint Document Details Patient Name: [name], [name] Date of Service: 12/01/2015 12:45 PM Medical Record Number: [id number] Patient Account Number: [id number] Date of Birth/Sex: 1945-09-28 (70 y.o. Male) Treating RN: Ahmed Prima Primary Care Physician: Fulton Reek Other Clinician: Referring Physician: Fulton Reek Treating Physician/Extender: Frann Rider in Treatment: 9 Information Obtained from: Patient Chief Complaint Patient presents to the wound care center for a consult due non healing wound for about 4 months now Electronic Signature(s) Signed: 12/01/2015 1:11:03 PM By: Christin Fudge MD, FACS Entered By: Christin Fudge on 12/01/2015 13:11:03 [name] ([id number]) -------------------------------------------------------------------------------- HPI Details Patient Name: [name], [name] Date of Service: 12/01/2015 12:45 PM Medical Record Number: [id number] Patient Account Number: [id number] Date of Birth/Sex: Sep 29, 1945 (70 y.o. Male) Treating RN: Ahmed Prima Primary Care Physician: Fulton Reek Other Clinician: Referring Physician: Fulton Reek Treating Physician/Extender: Frann Rider in Treatment: 9 History of Present Illness Location: ulcerated areas in the region of the sacrum bilaterally Quality: Patient reports experiencing a shooting pain to affected area(s). Severity: Patient states wound are getting worse. Duration: Patient has had the wound for > 3 months prior to seeking treatment at the wound center Timing: Pain in wound is constant (hurts all the time) Context: The wound would happen gradually Modifying Factors: Other treatment(s) tried include:has been treated by his PCP and has already been on chronic pain medications Associated Signs and Symptoms: Patient reports having difficulty standing for long periods. HPI Description: 70 year old  gentleman who was referred to [place] by his PCP Dr. Fulton Reek for decubitus ulcers in the region of the sacrum which are very painful and these had them for about 4 months now. The patient's past medical history includes aortic stenosis, atrial fibrillation, coronary artery disease, CHF, COPD, coronary artery disease, prediabetes, peripheral vascular disease, nicotine addiction. In the past he's had carotid artery angioplasty and coronary artery bypass graft and VSD repair. He now has a chronic hip condition which is causing him a lot of pain and he goes to a pain clinic for a long while for this. He's been a smoker for over 40 years and smokes about a pack of cigarettes a day. His last hemoglobin A1c done in February of this year was 5.7. 10/06/2015 -- he continues to have a lot of pain around his decubitus ulcers and other than that has been trying his best to offload. He is also working on his smoking cessation. Electronic Signature(s) Signed: 12/01/2015 1:11:09 PM By: Christin Fudge MD, FACS Entered By: Christin Fudge on 12/01/2015 13:11:08 [name] ([id number]) -------------------------------------------------------------------------------- Physical Exam Details Patient Name: [name], [name] Date of Service: 12/01/2015 12:45 PM Medical Record Number: [id number] Patient Account Number: [id number] Date of Birth/Sex: 02-Jun-1945 (70 y.o. Male) Treating RN: Ahmed Prima Primary Care Physician: Fulton Reek Other Clinician: Referring Physician: Fulton Reek Treating Physician/Extender: Frann Rider in Treatment: 9 Constitutional . Pulse regular. Respirations normal and unlabored. Afebrile. . Eyes Nonicteric. Reactive to light. Ears, Nose, Mouth, and Throat Lips, teeth, and gums WNL.Marland Kitchen Moist mucosa without lesions. Neck supple and nontender. No palpable supraclavicular or cervical adenopathy. Normal sized without goiter. Respiratory WNL. No retractions..  Breath sounds WNL, No rubs, rales, rhonchi, or wheeze.. Cardiovascular Heart rhythm and rate regular, no murmur or gallop.. Pedal Pulses WNL. No clubbing, cyanosis or edema. Gastrointestinal (GI) Abdomen without masses or tenderness.. No liver or spleen enlargement or tenderness.. Lymphatic No adneopathy. No adenopathy. No adenopathy. Musculoskeletal Adexa without tenderness or  enlargement.. Digits and nails w/o clubbing, cyanosis, infection, petechiae, ischemia, or inflammatory conditions.. Integumentary (Hair, Skin) No suspicious lesions. No crepitus or fluctuance. No peri-wound warmth or erythema. No masses.Marland Kitchen Psychiatric Judgement and insight Intact.. No evidence of depression, anxiety, or agitation.. Notes wound is looking excellent with no debris at the base and no debridement was required today. Electronic Signature(s) Signed: 12/01/2015 1:11:53 PM By: Christin Fudge MD, FACS Entered By: Christin Fudge on 12/01/2015 13:11:53 [name] ([id number]) -------------------------------------------------------------------------------- Physician Orders Details Patient Name: [name], [name] Date of Service: 12/01/2015 12:45 PM Medical Record Number: [id number] Patient Account Number: [id number] Date of Birth/Sex: April 05, 1946 (70 y.o. Male) Treating RN: Ahmed Prima Primary Care Physician: Fulton Reek Other Clinician: Referring Physician: Fulton Reek Treating Physician/Extender: Frann Rider in Treatment: 9 Verbal / Phone Orders: Yes Clinician: Carolyne Fiscal, Debi Read Back and Verified: Yes Diagnosis Coding Wound Cleansing Wound #2 Sacrum o Clean wound with Normal Saline. o Cleanse wound with mild soap and water o May Shower, gently pat wound dry prior to applying new dressing. Skin Barriers/Peri-Wound Care Wound #2 Sacrum o Skin Prep Primary Wound Dressing Wound #2 Sacrum o Prisma Ag - moisten with saline Secondary Dressing Wound #2  Sacrum o Dry Gauze o Boardered Foam Dressing Dressing Change Frequency Wound #2 Sacrum o Change dressing every other day. Follow-up Appointments Wound #2 Sacrum o Return Appointment in 1 week. Off-Loading Wound #2 Sacrum o Turn and reposition every 2 hours Additional Orders / Instructions Wound #2 Sacrum o Stop Smoking o Increase protein intake. ANTOINO, STAPF ([id number]) Medications-please add to medication list. Wound #2 Sacrum o Other: - Vitamin C, Zinc, Multivitamins Electronic Signature(s) Signed: 12/01/2015 3:30:17 PM By: Christin Fudge MD, FACS Signed: 12/01/2015 4:06:37 PM By: Alric Quan Entered By: Alric Quan on 12/01/2015 13:08:56 [name] ([id number]) -------------------------------------------------------------------------------- Problem List Details Patient Name: [name], [name] Date of Service: 12/01/2015 12:45 PM Medical Record Number: [id number] Patient Account Number: [id number] Date of Birth/Sex: November 01, 1945 (70 y.o. Male) Treating RN: Ahmed Prima Primary Care Physician: Fulton Reek Other Clinician: Referring Physician: Fulton Reek Treating Physician/Extender: Frann Rider in Treatment: 9 Active Problems ICD-10 Encounter Code Description Active Date Diagnosis L89.153 Pressure ulcer of sacral region, stage 3 09/29/2015 Yes F17.218 Nicotine dependence, cigarettes, with other nicotine- 09/29/2015 Yes induced disorders G89.4 Chronic pain syndrome 09/29/2015 Yes L89.313 Pressure ulcer of right buttock, stage 3 10/20/2015 Yes Inactive Problems Resolved Problems Electronic Signature(s) Signed: 12/01/2015 1:10:51 PM By: Christin Fudge MD, FACS Entered By: Christin Fudge on 12/01/2015 13:10:51 [name] ([id number]) -------------------------------------------------------------------------------- Progress Note Details Patient Name: [name], [name] Date of Service: 12/01/2015 12:45 PM Medical  Record Number: [id number] Patient Account Number: [id number] Date of Birth/Sex: [birth year] (70 y.o. Male) Treating RN: Ahmed Prima Primary Care Physician: Fulton Reek Other Clinician: Referring Physician: Fulton Reek Treating Physician/Extender: Frann Rider in Treatment: 9 Subjective Chief Complaint Information obtained from Patient Patient presents to the wound care center for a consult due non healing wound for about 4 months now History of Present Illness (HPI) The following HPI elements were documented for the patient's wound: Location: ulcerated areas in the region of the sacrum bilaterally Quality: Patient reports experiencing a shooting pain to affected area(s). Severity: Patient states wound are getting worse. Duration: Patient has had the wound for > 3 months prior to seeking treatment at the wound center Timing: Pain in wound is constant (hurts all the time) Context: The wound would happen gradually Modifying Factors: Other treatment(s) tried include:has been treated  by his PCP and has already been on chronic pain medications Associated Signs and Symptoms: Patient reports having difficulty standing for long periods. 70 year old gentleman who was referred to [place] by his PCP Dr. Fulton Reek for decubitus ulcers in the region of the sacrum which are very painful and these had them for about 4 months now. The patient's past medical history includes aortic stenosis, atrial fibrillation, coronary artery disease, CHF, COPD, coronary artery disease, prediabetes, peripheral vascular disease, nicotine addiction. In the past he's had carotid artery angioplasty and coronary artery bypass graft and VSD repair. He now has a chronic hip condition which is causing him a lot of pain and he goes to a pain clinic for a long while for this. He's been a smoker for over 40 years and smokes about a pack of cigarettes a day. His last hemoglobin A1c done in February of this year  was 5.7. 10/06/2015 -- he continues to have a lot of pain around his decubitus ulcers and other than that has been trying his best to offload. He is also working on his smoking cessation. Objective Constitutional Pulse regular. Respirations normal and unlabored. Afebrile. Vitals Time Taken: 12:56 PM, Height: 68 in, Weight: 175 lbs, BMI: 26.6, Temperature: 98.3 F, Pulse: 66 Grill, Kealii B. ([id number]) bpm, Respiratory Rate: 18 breaths/min, Blood Pressure: 129/62 mmHg. Eyes Nonicteric. Reactive to light. Ears, Nose, Mouth, and Throat Lips, teeth, and gums WNL.Marland Kitchen Moist mucosa without lesions. Neck supple and nontender. No palpable supraclavicular or cervical adenopathy. Normal sized without goiter. Respiratory WNL. No retractions.. Breath sounds WNL, No rubs, rales, rhonchi, or wheeze.. Cardiovascular Heart rhythm and rate regular, no murmur or gallop.. Pedal Pulses WNL. No clubbing, cyanosis or edema. Gastrointestinal (GI) Abdomen without masses or tenderness.. No liver or spleen enlargement or tenderness.. Lymphatic No adneopathy. No adenopathy. No adenopathy. Musculoskeletal Adexa without tenderness or enlargement.. Digits and nails w/o clubbing, cyanosis, infection, petechiae, ischemia, or inflammatory conditions.Marland Kitchen Psychiatric Judgement and insight Intact.. No evidence of depression, anxiety, or agitation.. General Notes: wound is looking excellent with no debris at the base and no debridement was required today. Integumentary (Hair, Skin) No suspicious lesions. No crepitus or fluctuance. No peri-wound warmth or erythema. No masses.. Wound #2 status is Open. Original cause of wound was Gradually Appeared. The wound is located on the Sacrum. The wound measures 0.7cm length x 0.3cm width x 0.1cm depth; 0.165cm^2 area and 0.016cm^3 volume. The wound is limited to skin breakdown. There is no tunneling or undermining noted. There is a large amount of serous drainage noted. The  wound margin is flat and intact. There is no granulation within the wound bed. There is a large (67-100%) amount of necrotic tissue within the wound bed including Adherent Slough. The periwound skin appearance exhibited: Moist, Erythema. The surrounding wound skin color is noted with erythema which is circumferential. Periwound temperature was noted as No Abnormality. The periwound has tenderness on palpation. Assessment [name], [name] ([id number]) Active Problems ICD-10 L89.153 - Pressure ulcer of sacral region, stage 3 F17.218 - Nicotine dependence, cigarettes, with other nicotine-induced disorders G89.4 - Chronic pain syndrome L89.313 - Pressure ulcer of right buttock, stage 3 I have recommended we stop Santyl ointment and go to [employer] to be changed every other day with a bordered foam. He is again urged to give up smoking completely and he is continuing to work with this. Plan Wound Cleansing: Wound #2 Sacrum: Clean wound with Normal Saline. Cleanse wound with mild soap and water May Shower,  gently pat wound dry prior to applying new dressing. Skin Barriers/Peri-Wound Care: Wound #2 Sacrum: Skin Prep Primary Wound Dressing: Wound #2 Sacrum: Prisma Ag - moisten with saline Secondary Dressing: Wound #2 Sacrum: Dry Gauze Boardered Foam Dressing Dressing Change Frequency: Wound #2 Sacrum: Change dressing every other day. Follow-up Appointments: Wound #2 Sacrum: Return Appointment in 1 week. Off-Loading: Wound #2 Sacrum: Turn and reposition every 2 hours Additional Orders / Instructions: Wound #2 Sacrum: Stop Smoking Increase protein intake. [name], [name] ([id number]) Medications-please add to medication list.: Wound #2 Sacrum: Other: - Vitamin C, Zinc, Multivitamins I have recommended we stop Santyl ointment and go to [employer] to be changed every other day with a bordered foam. He is again urged to give up smoking completely and he is continuing to work  with this. Electronic Signature(s) Signed: 12/01/2015 1:12:49 PM By: Christin Fudge MD, FACS Entered By: Christin Fudge on 12/01/2015 13:12:49 [name] ([id number]) -------------------------------------------------------------------------------- SuperBill Details Patient Name: [name], Merion B. Date of Service: 12/01/2015 Medical Record Number: [id number] Patient Account Number: [id number] Date of Birth/Sex: May 29, 1945 (70 y.o. Male) Treating RN: Ahmed Prima Primary Care Physician: Fulton Reek Other Clinician: Referring Physician: Fulton Reek Treating Physician/Extender: Frann Rider in Treatment: 9 Diagnosis Coding ICD-10 Codes Code Description [phone] Pressure ulcer of sacral region, stage 3 F17.218 Nicotine dependence, cigarettes, with other nicotine-induced disorders G89.4 Chronic pain syndrome L89.313 Pressure ulcer of right buttock, stage 3 Facility Procedures CPT4 Code: [id number] Description: 99213 - WOUND CARE VISIT-LEV 3 EST PT Modifier: Quantity: 1 Physician Procedures CPT4 Code Description: [id number] - WC PHYS LEVEL 3 - EST PT ICD-10 Description Diagnosis L89.153 Pressure ulcer of sacral region, stage 3 F17.218 Nicotine dependence, cigarettes, with other nicotin G89.4 Chronic pain syndrome Modifier: e-induced dis Quantity: 1 orders Electronic Signature(s) Signed: 12/01/2015 3:30:17 PM By: Christin Fudge MD, FACS Signed: 12/01/2015 4:06:37 PM By: Alric Quan Previous Signature: 12/01/2015 1:13:03 PM Version By: Christin Fudge MD, FACS Entered By: Alric Quan on 12/01/2015 14:14:20

## 2015-12-01 NOTE — Progress Notes (Signed)
PHILOPATEER, HEVENER ([id number]) Visit Report for 12/01/2015 Arrival Information Details Patient Name: [name], [name] Date of Service: 12/01/2015 12:45 PM Medical Record Number: [id number] Patient Account Number: [id number] Date of Birth/Sex: 10-17-1945 (70 y.o. Male) Treating RN: Ahmed Prima Primary Care Physician: Fulton Reek Other Clinician: Referring Physician: Fulton Reek Treating Physician/Extender: Frann Rider in Treatment: 9 Visit Information History Since Last Visit All ordered tests and consults were completed: No Patient Arrived: Ambulatory Added or deleted any medications: No Arrival Time: 12:54 Any new allergies or adverse reactions: No Accompanied By: wife Had a fall or experienced change in No Transfer Assistance: None activities of daily living that may affect Patient Identification Verified: Yes risk of falls: Secondary Verification Process Yes Signs or symptoms of abuse/neglect since last No Completed: visito Patient Requires Transmission-Based No Hospitalized since last visit: No Precautions: Pain Present Now: No Patient Has Alerts: No Electronic Signature(s) Signed: 12/01/2015 4:06:37 PM By: Alric Quan Entered By: Alric Quan on 12/01/2015 13:10:29 [name] ([id number]) -------------------------------------------------------------------------------- Clinic Level of Care Assessment Details Patient Name: [name], [name] Date of Service: 12/01/2015 12:45 PM Medical Record Number: [id number] Patient Account Number: [id number] Date of Birth/Sex: [birth year] (70 y.o. Male) Treating RN: Ahmed Prima Primary Care Physician: Fulton Reek Other Clinician: Referring Physician: Fulton Reek Treating Physician/Extender: Frann Rider in Treatment: 9 Clinic Level of Care Assessment Items TOOL 4 Quantity Score X - Use when only an EandM is performed on FOLLOW-UP visit 1 0 ASSESSMENTS - Nursing Assessment /  Reassessment X - Reassessment of Co-morbidities (includes updates in patient status) 1 10 X - Reassessment of Adherence to Treatment Plan 1 5 ASSESSMENTS - Wound and Skin Assessment / Reassessment X - Simple Wound Assessment / Reassessment - one wound 1 5 []  - Complex Wound Assessment / Reassessment - multiple wounds 0 []  - Dermatologic / Skin Assessment (not related to wound area) 0 ASSESSMENTS - Focused Assessment []  - Circumferential Edema Measurements - multi extremities 0 []  - Nutritional Assessment / Counseling / Intervention 0 []  - Lower Extremity Assessment (monofilament, tuning fork, pulses) 0 []  - Peripheral Arterial Disease Assessment (using hand held doppler) 0 ASSESSMENTS - Ostomy and/or Continence Assessment and Care []  - Incontinence Assessment and Management 0 []  - Ostomy Care Assessment and Management (repouching, etc.) 0 PROCESS - Coordination of Care X - Simple Patient / Family Education for ongoing care 1 15 []  - Complex (extensive) Patient / Family Education for ongoing care 0 []  - Staff obtains Programmer, systems, Records, Test Results / Process Orders 0 []  - Staff telephones HHA, Nursing Homes / Clarify orders / etc 0 []  - Routine Transfer to another Facility (non-emergent condition) 0 [place], [place] ([id number]) []  - Routine Hospital Admission (non-emergent condition) 0 []  - New Admissions / Biomedical engineer / Ordering NPWT, Apligraf, etc. 0 []  - Emergency Hospital Admission (emergent condition) 0 X - Simple Discharge Coordination 1 10 []  - Complex (extensive) Discharge Coordination 0 PROCESS - Special Needs []  - Pediatric / Minor Patient Management 0 []  - Isolation Patient Management 0 []  - Hearing / Language / Visual special needs 0 []  - Assessment of Community assistance (transportation, D/C planning, etc.) 0 []  - Additional assistance / Altered mentation 0 []  - Support Surface(s) Assessment (bed, cushion, seat, etc.) 0 INTERVENTIONS - Wound Cleansing /  Measurement X - Simple Wound Cleansing - one wound 1 5 []  - Complex Wound Cleansing - multiple wounds 0 X - Wound Imaging (photographs - any number of wounds) 1 5 []  -  Wound Tracing (instead of photographs) 0 X - Simple Wound Measurement - one wound 1 5 []  - Complex Wound Measurement - multiple wounds 0 INTERVENTIONS - Wound Dressings X - Small Wound Dressing one or multiple wounds 1 10 []  - Medium Wound Dressing one or multiple wounds 0 []  - Large Wound Dressing one or multiple wounds 0 X - Application of Medications - topical 1 5 []  - Application of Medications - injection 0 INTERVENTIONS - Miscellaneous []  - External ear exam 0 [name], [name] ([id number]) []  - Specimen Collection (cultures, biopsies, blood, body fluids, etc.) 0 []  - Specimen(s) / Culture(s) sent or taken to Lab for analysis 0 []  - Patient Transfer (multiple staff / Harrel Lemon Lift / Similar devices) 0 []  - Simple Staple / Suture removal (25 or less) 0 []  - Complex Staple / Suture removal (26 or more) 0 []  - Hypo / Hyperglycemic Management (close monitor of Blood Glucose) 0 []  - Ankle / Brachial Index (ABI) - do not check if billed separately 0 X - Vital Signs 1 5 Has the patient been seen at the hospital within the last three years: Yes Total Score: 80 Level Of Care: New/Established - Level 3 Electronic Signature(s) Signed: 12/01/2015 4:06:37 PM By: Alric Quan Entered By: Alric Quan on 12/01/2015 14:14:07 [name] ([id number]) -------------------------------------------------------------------------------- Encounter Discharge Information Details Patient Name: [name], [name] Date of Service: 12/01/2015 12:45 PM Medical Record Number: [id number] Patient Account Number: [id number] Date of Birth/Sex: 03/23/1946 (70 y.o. Male) Treating RN: Ahmed Prima Primary Care Physician: Fulton Reek Other Clinician: Referring Physician: Fulton Reek Treating Physician/Extender: Frann Rider in Treatment: 9 Encounter Discharge Information Items Discharge Pain Level: 0 Discharge Condition: Stable Ambulatory Status: Ambulatory Discharge Destination: Home Transportation: Private Auto Accompanied By: wife Schedule Follow-up Appointment: Yes Medication Reconciliation completed and provided to Patient/Care Yes Jerold Yoss: Provided on Clinical Summary of Care: 12/01/2015 Form Type Recipient Paper Patient PB Electronic Signature(s) Signed: 12/01/2015 1:22:07 PM By: Ruthine Dose Entered By: Ruthine Dose on 12/01/2015 13:22:07 [name] ([id number]) -------------------------------------------------------------------------------- Lower Extremity Assessment Details Patient Name: [name], [name] Date of Service: 12/01/2015 12:45 PM Medical Record Number: [id number] Patient Account Number: [id number] Date of Birth/Sex: 19-Nov-1945 (70 y.o. Male) Treating RN: Ahmed Prima Primary Care Physician: Fulton Reek Other Clinician: Referring Physician: Fulton Reek Treating Physician/Extender: Frann Rider in Treatment: 9 Electronic Signature(s) Signed: 12/01/2015 4:06:37 PM By: Alric Quan Entered By: Alric Quan on 12/01/2015 12:57:43 [name] ([id number]) -------------------------------------------------------------------------------- Multi Wound Chart Details Patient Name: [name], [name] Date of Service: 12/01/2015 12:45 PM Medical Record Number: [id number] Patient Account Number: [id number] Date of Birth/Sex: 06/25/1945 (70 y.o. 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:Pressure Ulcer] [N/A:N/A] Comorbid History: [2:Chronic Obstructive Pulmonary Disease (COPD), Arrhythmia, Congestive Heart Failure, Coronary Artery Disease, Hypertension, Peripheral Venous Disease, Osteoarthritis] [N/A:N/A] Date Acquired: [2:06/01/2015] [N/A:N/A] Weeks of Treatment: [2:9] [N/A:N/A] Wound Status: [2:Open] [N/A:N/A] Measurements L x W x D 0.7x0.3x0.1 [N/A:N/A] (cm) Area (cm) : [2:0.165] [N/A:N/A] Volume (cm) : [2:0.016] [N/A:N/A] % Reduction in Area: [2:98.90%] [N/A:N/A] % Reduction in Volume: 99.00% [N/A:N/A] Classification: [2:Category/Stage II] [N/A:N/A] Exudate Amount: [2:Large] [N/A:N/A] Exudate Type: [2:Serous] [N/A:N/A] Exudate Color: [2:amber] [N/A:N/A] Wound  Margin: [2:Flat and Intact] [N/A:N/A] Granulation Amount: [2:None Present (0%)] [N/A:N/A] Necrotic Amount: [2:Large (67-100%)] [N/A:N/A] Exposed Structures: [2:Fascia: No Fat: No] [N/A:N/A] Tendon: No Muscle: No Joint: No Bone: No Limited to Skin Breakdown Epithelialization: Medium (34-66%) N/A N/A Periwound Skin Texture: No Abnormalities Noted N/A N/A Periwound Skin Moist: Yes N/A N/A Moisture: Periwound Skin Color: Erythema: Yes N/A N/A Erythema Location: Circumferential N/A N/A Temperature: No Abnormality N/A N/A Tenderness on Yes N/A N/A Palpation: Wound Preparation: Ulcer Cleansing: N/A N/A Rinsed/Irrigated with Saline Topical Anesthetic Applied: Other: lidocaine 4% Treatment Notes Electronic Signature(s) Signed: 12/01/2015 4:06:37 PM By: Alric Quan Entered By: Alric Quan on 12/01/2015 13:03:42 [name] ([id number]) -------------------------------------------------------------------------------- [place] Details Patient Name: [name], [name] Date of Service: 12/01/2015 12:45 PM Medical Record Number:  [id number] Patient Account Number: [id number] Date of Birth/Sex: Nov 25, 1945 (70 y.o. Male) Treating RN: Ahmed Prima Primary Care Physician: Fulton Reek Other Clinician: Referring Physician: Fulton Reek Treating Physician/Extender: Frann Rider in Treatment: 9 Active Inactive Orientation to the Wound Care Program Nursing Diagnoses: Knowledge deficit related to the wound healing center program Goals: Patient/caregiver will verbalize understanding of the [facility] Program Date Initiated: 10/20/2015 Goal Status: Active Interventions: Provide education on orientation to the wound center Notes: Pressure Nursing Diagnoses: Knowledge deficit related to management of pressures ulcers Goals: Patient will remain free from development of additional pressure ulcers Date Initiated: 10/20/2015 Goal Status: Active Interventions: Assess: immobility, friction, shearing, incontinence upon admission and as needed Notes: Electronic Signature(s) Signed: 12/01/2015 4:06:37 PM By: Alric Quan Entered By: Alric Quan on 12/01/2015 13:03:35 [name] ([id number]) -------------------------------------------------------------------------------- Pain Assessment Details Patient Name: [name], [name] Date of Service: 12/01/2015 12:45 PM Medical Record Number: [id number] Patient Account Number: [id number] Date of Birth/Sex: 1946/02/01 (70 y.o. Male) Treating RN: Ahmed Prima Primary Care Physician: Fulton Reek Other Clinician: Referring Physician: Fulton Reek Treating Physician/Extender: Frann Rider in Treatment: 9 Active Problems Location of Pain Severity and Description of Pain Patient Has Paino No Site Locations With Dressing Change: No Pain Management and Medication Current Pain Management: Electronic Signature(s) Signed: 12/01/2015 4:06:37 PM By: Alric Quan Entered By: Alric Quan on 12/01/2015 12:54:59 [name] ([id number]) -------------------------------------------------------------------------------- Patient/Caregiver Education Details Patient Name: [name], [name] Date of Service: 12/01/2015 12:45 PM Medical Record Number: [id number] Patient Account Number: [id number] Date of Birth/Gender: 10/12/1945 (70 y.o. Male) Treating RN: Ahmed Prima Primary Care Physician: Fulton Reek Other Clinician: Referring Physician: Fulton Reek Treating Physician/Extender: Frann Rider in Treatment: 9 Education Assessment Education Provided To: Patient Education Topics Provided Wound/Skin Impairment: Handouts: Other: change dressing as ordered Methods: Demonstration, Explain/Verbal Responses: State content correctly Electronic Signature(s) Signed: 12/01/2015 4:06:37 PM By: Alric Quan Entered By: Alric Quan on 12/01/2015 13:10:01 [name] ([id number]) -------------------------------------------------------------------------------- Wound Assessment Details Patient Name: [name], [name] Date of Service: 12/01/2015 12:45 PM Medical Record Number: [id number] Patient Account Number: [id number] Date of Birth/Sex: 1946-02-10 (70 y.o. Male) Treating RN: Ahmed Prima Primary Care Physician: Fulton Reek Other Clinician: Referring Physician: Fulton Reek Treating Physician/Extender: Frann Rider in Treatment: 9 Wound Status Wound Number: 2 Primary Pressure Ulcer Etiology: Wound Location: Sacrum Wound Open Wounding Event: Gradually Appeared Status: Date Acquired: 06/01/2015 Comorbid Chronic Obstructive Pulmonary Disease Weeks Of Treatment: 9 History: (COPD), Arrhythmia, Congestive Heart Clustered Wound: No Failure, Coronary Artery Disease, Hypertension, Peripheral Venous Disease, Osteoarthritis Photos Photo Uploaded By: Alric Quan on 12/01/2015 15:28:18 Wound Measurements Length: (cm) 0.7 Width: (cm) 0.3 Depth: (cm)  0.1 Area: (cm) 0.165 Volume: (  cm) 0.016 % Reduction in Area: 98.9% % Reduction in Volume: 99% Epithelialization: Medium (34-66%) Tunneling: No Undermining: No Wound Description Classification: Category/Stage II Wound Margin: Flat and Intact Exudate Amount: Large Exudate Type: Serous Exudate Color: amber Foul Odor After Cleansing: No Wound Bed Granulation Amount: None Present (0%) Exposed Structure Necrotic Amount: Large (67-100%) Fascia Exposed: No [name], Zackory B. ([id number]) Necrotic Quality: Adherent Slough Fat Layer Exposed: No Tendon Exposed: No Muscle Exposed: No Joint Exposed: No Bone Exposed: No Limited to Skin Breakdown Periwound Skin Texture Texture Color No Abnormalities Noted: No No Abnormalities Noted: No Erythema: Yes Moisture Erythema Location: Circumferential No Abnormalities Noted: No Moist: Yes Temperature / Pain Temperature: No Abnormality Tenderness on Palpation: Yes Wound Preparation Ulcer Cleansing: Rinsed/Irrigated with Saline Topical Anesthetic Applied: Other: lidocaine 4%, Treatment Notes Wound #2 (Sacrum) 1. Cleansed with: Clean wound with Normal Saline 2. Anesthetic Topical Lidocaine 4% cream to wound bed prior to debridement 3. Peri-wound Care: Skin Prep 4. Dressing Applied: Prisma Ag 5. Secondary Dressing Applied Bordered Foam Dressing Dry Gauze Electronic Signature(s) Signed: 12/01/2015 4:06:37 PM By: Alric Quan Entered By: Alric Quan on 12/01/2015 13:03:28 [name] ([id number]) -------------------------------------------------------------------------------- Vitals Details Patient Name: [name], [name] Date of Service: 12/01/2015 12:45 PM Medical Record Number: [id number] Patient Account Number: [id number] Date of Birth/Sex: 1945-10-05 (70 y.o. Male) Treating RN: Ahmed Prima Primary Care Physician: Fulton Reek Other Clinician: Referring Physician: Fulton Reek Treating  Physician/Extender: Frann Rider in Treatment: 9 Vital Signs Time Taken: 12:56 Temperature (F): 98.3 Height (in): 68 Pulse (bpm): 66 Weight (lbs): 175 Respiratory Rate (breaths/min): 18 Body Mass Index (BMI): 26.6 Blood Pressure (mmHg): 129/62 Reference Range: 80 - 120 mg / dl Electronic Signature(s) Signed: 12/01/2015 4:06:37 PM By: Alric Quan Entered By: Alric Quan on 12/01/2015 12:57:30

## 2015-12-08 ENCOUNTER — Encounter: Payer: Medicare Other | Admitting: Surgery

## 2015-12-08 DIAGNOSIS — L89153 Pressure ulcer of sacral region, stage 3: Secondary | ICD-10-CM | POA: Diagnosis not present

## 2015-12-08 NOTE — Progress Notes (Signed)
[name], [name] ([id number]) Visit Report for 12/08/2015 Arrival Information Details Patient Name: [name], [name] Date of Service: 12/08/2015 3:00 PM Medical Record Number: [id number] Patient Account Number: [id number] Date of Birth/Sex: Jun 03, 1945 (70 y.o. Male) Treating RN: Montey Hora Primary Care Physician: Fulton Reek Other Clinician: Referring Physician: Fulton Reek Treating Physician/Extender: Frann Rider in Treatment: 10 Visit Information History Since Last Visit Added or deleted any medications: No Patient Arrived: Ambulatory Any new allergies or adverse reactions: No Arrival Time: 15:24 Had a fall or experienced change in No Accompanied By: spouse activities of daily living that may affect Transfer Assistance: None risk of falls: Patient Identification Verified: Yes Signs or symptoms of abuse/neglect since last No Secondary Verification Process Yes visito Completed: Hospitalized since last visit: No Patient Requires Transmission-Based No Pain Present Now: Yes Precautions: Patient Has Alerts: No Electronic Signature(s) Signed: 12/08/2015 4:52:28 PM By: Montey Hora Entered By: Montey Hora on 12/08/2015 15:24:28 [name] ([id number]) -------------------------------------------------------------------------------- Encounter Discharge Information Details Patient Name: [name], [name] Date of Service: 12/08/2015 3:00 PM Medical Record Number: [id number] Patient Account Number: [id number] Date of Birth/Sex: 12/29/1945 (70 y.o. Male) Treating RN: Montey Hora Primary Care Physician: Fulton Reek Other Clinician: Referring Physician: Fulton Reek Treating Physician/Extender: Frann Rider in Treatment: 10 Encounter Discharge Information Items Discharge Pain Level: 0 Discharge Condition: Stable Ambulatory Status: Ambulatory Discharge Destination: Home Transportation: Private Auto Accompanied By:  spouse Schedule Follow-up Appointment: Yes Medication Reconciliation completed and provided to Patient/Care No [name]: Provided on Clinical Summary of Care: 12/08/2015 Form Type Recipient Paper Patient PB Electronic Signature(s) Signed: 12/08/2015 3:45:44 PM By: Ruthine Dose Entered By: Ruthine Dose on 12/08/2015 15:45:43 [name] ([id number]) -------------------------------------------------------------------------------- Multi Wound Chart Details Patient Name: [name], [name] Date of Service: 12/08/2015 3:00 PM Medical Record Number: [id number] Patient Account Number: [id number] Date of Birth/Sex: 1946-04-04 (70 y.o. Male) Treating RN: Montey Hora Primary Care Physician: Fulton Reek Other Clinician: Referring Physician: Fulton Reek Treating Physician/Extender: Frann Rider in Treatment: 10 Vital Signs Height(in): 68 Pulse(bpm): 70 Weight(lbs): 175 Blood Pressure 109/40 (mmHg): Body Mass Index(BMI): 27 Temperature(F): 98.2 Respiratory Rate 18 (breaths/min): Photos: [2:No Photos] [N/A:N/A] Wound Location: [2:Sacrum] [N/A:N/A] Wounding Event: [2:Gradually Appeared] [N/A:N/A] Primary Etiology: [2:Pressure Ulcer] [N/A:N/A] Comorbid History: [2:Chronic Obstructive Pulmonary Disease (COPD), Arrhythmia, Congestive Heart Failure, Coronary Artery Disease, Hypertension, Peripheral Venous Disease, Osteoarthritis] [N/A:N/A] Date Acquired: [2:06/01/2015] [N/A:N/A] Weeks of Treatment: [2:10] [N/A:N/A] Wound Status: [2:Open] [N/A:N/A] Measurements L x W x D 0.5x0.2x0.1 [N/A:N/A] (cm) Area (cm) : [2:0.079] [N/A:N/A] Volume (cm) : [2:0.008] [N/A:N/A] % Reduction in Area: [2:99.50%] [N/A:N/A] % Reduction in Volume: 99.50% [N/A:N/A] Classification: [2:Category/Stage II] [N/A:N/A] Exudate Amount: [2:Large] [N/A:N/A] Exudate Type: [2:Serous] [N/A:N/A] Exudate Color: [2:amber] [N/A:N/A] Wound Margin: [2:Flat and Intact] [N/A:N/A] Granulation  Amount: [2:None Present (0%)] [N/A:N/A] Necrotic Amount: [2:Large (67-100%)] [N/A:N/A] Exposed Structures: [2:Fascia: No Fat: No] [N/A:N/A] Tendon: No Muscle: No Joint: No Bone: No Limited to Skin Breakdown Epithelialization: Medium (34-66%) N/A N/A Periwound Skin Texture: No Abnormalities Noted N/A N/A Periwound Skin Moist: Yes N/A N/A Moisture: Periwound Skin Color: Erythema: Yes N/A N/A Erythema Location: Circumferential N/A N/A Temperature: No Abnormality N/A N/A Tenderness on Yes N/A N/A Palpation: Wound Preparation: Ulcer Cleansing: N/A N/A Rinsed/Irrigated with Saline Topical Anesthetic Applied: Other: lidocaine 4% Treatment Notes Electronic Signature(s) Signed: 12/08/2015 4:52:28 PM By: Montey Hora Entered By: Montey Hora on 12/08/2015 15:36:31 [name] ([id number]) -------------------------------------------------------------------------------- [place] Details Patient Name: [name], [name] Date of Service: 12/08/2015 3:00 PM Medical Record Number: [id number]  Patient Account Number: [id number] Date of Birth/Sex: Jul 24, 1945 (70 y.o. Male) Treating RN: Montey Hora Primary Care Physician: Fulton Reek Other Clinician: Referring Physician: Fulton Reek Treating Physician/Extender: Frann Rider in Treatment: 10 Active Inactive Orientation to the Wound Care Program Nursing Diagnoses: Knowledge deficit related to the wound healing center program Goals: Patient/caregiver will verbalize understanding of the [facility] Program Date Initiated: 10/20/2015 Goal Status: Active Interventions: Provide education on orientation to the wound center Notes: Pressure Nursing Diagnoses: Knowledge deficit related to management of pressures ulcers Goals: Patient will remain free from development of additional pressure ulcers Date Initiated: 10/20/2015 Goal Status: Active Interventions: Assess: immobility,  friction, shearing, incontinence upon admission and as needed Notes: Electronic Signature(s) Signed: 12/08/2015 4:52:28 PM By: Montey Hora Entered By: Montey Hora on 12/08/2015 15:35:47 [name] ([id number]) -------------------------------------------------------------------------------- Pain Assessment Details Patient Name: [name], [name] Date of Service: 12/08/2015 3:00 PM Medical Record Number: [id number] Patient Account Number: [id number] Date of Birth/Sex: [birth year] (70 y.o. Male) Treating RN: Montey Hora Primary Care Physician: Fulton Reek Other Clinician: Referring Physician: Fulton Reek Treating Physician/Extender: Frann Rider in Treatment: 10 Active Problems Location of Pain Severity and Description of Pain Patient Has Paino Yes Site Locations Pain Location: Pain in Ulcers With Dressing Change: Yes Duration of the Pain. Constant / Intermittento Constant Pain Management and Medication Current Pain Management: Electronic Signature(s) Signed: 12/08/2015 4:52:28 PM By: Montey Hora Entered By: Montey Hora on 12/08/2015 15:25:00 [name] ([id number]) -------------------------------------------------------------------------------- Patient/Caregiver Education Details Patient Name: [name], [name] Date of Service: 12/08/2015 3:00 PM Medical Record Number: [id number] Patient Account Number: [id number] Date of Birth/Gender: April 15, 1946 (70 y.o. Male) Treating RN: Montey Hora Primary Care Physician: Fulton Reek Other Clinician: Referring Physician: Fulton Reek Treating Physician/Extender: Frann Rider in Treatment: 10 Education Assessment Education Provided To: Patient Education Topics Provided Wound/Skin Impairment: Handouts: Other: wound care as ordered Methods: Demonstration, Explain/Verbal Responses: State content correctly Electronic Signature(s) Signed: 12/08/2015 4:52:28 PM By: Montey Hora Entered By: Montey Hora on 12/08/2015 15:44:41 [name] ([id number]) -------------------------------------------------------------------------------- Wound Assessment Details Patient Name: [name], [name] Date of Service: 12/08/2015 3:00 PM Medical Record Number: [id number] Patient Account Number: [id number] Date of Birth/Sex: 04-07-1946 (70 y.o. Male) Treating RN: Montey Hora Primary Care Physician: Fulton Reek Other Clinician: Referring Physician: Fulton Reek Treating Physician/Extender: Frann Rider in Treatment: 10 Wound Status Wound Number: 2 Primary Pressure Ulcer Etiology: Wound Location: Sacrum Wound Open Wounding Event: Gradually Appeared Status: Date Acquired: 06/01/2015 Comorbid Chronic Obstructive Pulmonary Disease Weeks Of Treatment: 10 History: (COPD), Arrhythmia, Congestive Heart Clustered Wound: No Failure, Coronary Artery Disease, Hypertension, Peripheral Venous Disease, Osteoarthritis Photos Wound Measurements Length: (cm) 0.5 Width: (cm) 0.2 Depth: (cm) 0.1 Area: (cm) 0.079 Volume: (cm) 0.008 % Reduction in Area: 99.5% % Reduction in Volume: 99.5% Epithelialization: Medium (34-66%) Tunneling: No Undermining: No Wound Description Classification: Category/Stage II Wound Margin: Flat and Intact Exudate Amount: Large Exudate Type: Serous Exudate Color: amber Foul Odor After Cleansing: No Wound Bed Granulation Amount: None Present (0%) Exposed Structure Necrotic Amount: Large (67-100%) Fascia Exposed: No Necrotic Quality: Adherent Slough Fat Layer Exposed: No Mcmartin, Tytus B. ([id number]) Tendon Exposed: No Muscle Exposed: No Joint Exposed: No Bone Exposed: No Limited to Skin Breakdown Periwound Skin Texture Texture Color No Abnormalities Noted: No No Abnormalities Noted: No Erythema: Yes Moisture Erythema Location: Circumferential No Abnormalities Noted: No Moist: Yes Temperature /  Pain Temperature: No Abnormality Tenderness on Palpation: Yes Wound Preparation Ulcer Cleansing: Rinsed/Irrigated with Saline Topical  Anesthetic Applied: Other: lidocaine 4%, Treatment Notes Wound #2 (Sacrum) 1. Cleansed with: Clean wound with Normal Saline 2. Anesthetic Topical Lidocaine 4% cream to wound bed prior to debridement 3. Peri-wound Care: Skin Prep 4. Dressing Applied: Prisma Ag 5. Secondary Dressing Applied Bordered Foam Dressing Electronic Signature(s) Signed: 12/08/2015 4:52:28 PM By: Montey Hora Entered By: Montey Hora on 12/08/2015 15:58:08 [name] ([id number]) -------------------------------------------------------------------------------- [place] Details Patient Name: [name], [name] Date of Service: 12/08/2015 3:00 PM Medical Record Number: [id number] Patient Account Number: [id number] Date of Birth/Sex: [birth year] (70 y.o. Male) Treating RN: Montey Hora Primary Care Physician: Fulton Reek Other Clinician: Referring Physician: Fulton Reek Treating Physician/Extender: Frann Rider in Treatment: 10 Vital Signs Time Taken: 15:27 Temperature (F): 98.2 Height (in): 68 Pulse (bpm): 70 Weight (lbs): 175 Respiratory Rate (breaths/min): 18 Body Mass Index (BMI): 26.6 Blood Pressure (mmHg): 109/40 Reference Range: 80 - 120 mg / dl Electronic Signature(s) Signed: 12/08/2015 4:52:28 PM By: Montey Hora Entered By: Montey Hora on 12/08/2015 15:30:14

## 2015-12-08 NOTE — Progress Notes (Signed)
TYMARION, MORRE ([id number]) Visit Report for 12/08/2015 Chief Complaint Document Details Patient Name: [name], [name] Date of Service: 12/08/2015 3:00 PM Medical Record Number: [id number] Patient Account Number: [id number] Date of Birth/Sex: [birth year] (70 y.o. Male) Treating RN: Montey Hora Primary Care Physician: Fulton Reek Other Clinician: Referring Physician: Fulton Reek Treating Physician/Extender: Frann Rider in Treatment: 10 Information Obtained from: Patient Chief Complaint Patient presents to the wound care center for a consult due non healing wound for about 4 months now Electronic Signature(s) Signed: 12/08/2015 3:51:11 PM By: Christin Fudge MD, FACS Entered By: Christin Fudge on 12/08/2015 15:51:10 [name] ([id number]) -------------------------------------------------------------------------------- Debridement Details Patient Name: [name], [name] Date of Service: 12/08/2015 3:00 PM Medical Record Number: [id number] Patient Account Number: [id number] Date of Birth/Sex: 1945-09-29 (70 y.o. Male) Treating RN: Montey Hora Primary Care Physician: Fulton Reek Other Clinician: Referring Physician: Fulton Reek Treating Physician/Extender: Frann Rider in Treatment: 10 Debridement Performed for Wound #2 Sacrum Assessment: Performed By: Physician Christin Fudge, MD Debridement: Debridement Pre-procedure Yes Verification/Time Out Taken: Start Time: 15:38 Pain Control: Lidocaine 4% Topical Solution Level: Skin/Subcutaneous Tissue Total Area Debrided (L x 0.5 (cm) x 0.2 (cm) = 0.1 (cm) W): Tissue and other Viable, Non-Viable, Fibrin/Slough, Subcutaneous material debrided: Instrument: Curette Bleeding: Minimum Hemostasis Achieved: Pressure End Time: 15:40 Procedural Pain: 0 Post Procedural Pain: 0 Response to Treatment: Procedure was tolerated well Post Debridement Measurements of Total Wound Length: (cm)  0.5 Stage: Category/Stage II Width: (cm) 0.2 Depth: (cm) 0.1 Volume: (cm) 0.008 Post Procedure Diagnosis Same as Pre-procedure Electronic Signature(s) Signed: 12/08/2015 3:50:57 PM By: Christin Fudge MD, FACS Signed: 12/08/2015 4:52:28 PM By: Montey Hora Entered By: Christin Fudge on 12/08/2015 15:50:57 [name] ([id number]) -------------------------------------------------------------------------------- HPI Details Patient Name: [name], [name] Date of Service: 12/08/2015 3:00 PM Medical Record Number: [id number] Patient Account Number: [id number] Date of Birth/Sex: Jun 30, 1945 (70 y.o. Male) Treating RN: Montey Hora Primary Care Physician: Fulton Reek Other Clinician: Referring Physician: Fulton Reek Treating Physician/Extender: Frann Rider in Treatment: 10 History of Present Illness Location: ulcerated areas in the region of the sacrum bilaterally Quality: Patient reports experiencing a shooting pain to affected area(s). Severity: Patient states wound are getting worse. Duration: Patient has had the wound for > 3 months prior to seeking treatment at the wound center Timing: Pain in wound is constant (hurts all the time) Context: The wound would happen gradually Modifying Factors: Other treatment(s) tried include:has been treated by his PCP and has already been on chronic pain medications Associated Signs and Symptoms: Patient reports having difficulty standing for long periods. HPI Description: 70 year old gentleman who was referred to [place] by his PCP Dr. Fulton Reek for decubitus ulcers in the region of the sacrum which are very painful and these had them for about 4 months now. The patient's past medical history includes aortic stenosis, atrial fibrillation, coronary artery disease, CHF, COPD, coronary artery disease, prediabetes, peripheral vascular disease, nicotine addiction. In the past he's had carotid artery angioplasty and coronary  artery bypass graft and VSD repair. He now has a chronic hip condition which is causing him a lot of pain and he goes to a pain clinic for a long while for this. He's been a smoker for over 40 years and smokes about a pack of cigarettes a day. His last hemoglobin A1c done in February of this year was 5.7. 10/06/2015 -- he continues to have a lot of pain around his decubitus ulcers and other than that has been trying  his best to offload. He is also working on his smoking cessation. Electronic Signature(s) Signed: 12/08/2015 3:51:18 PM By: Christin Fudge MD, FACS Entered By: Christin Fudge on 12/08/2015 15:51:18 [name] ([id number]) -------------------------------------------------------------------------------- Physical Exam Details Patient Name: [name], [name] Date of Service: 12/08/2015 3:00 PM Medical Record Number: [id number] Patient Account Number: [id number] Date of Birth/Sex: May 28, 1945 (70 y.o. Male) Treating RN: Montey Hora Primary Care Physician: Fulton Reek Other Clinician: Referring Physician: Fulton Reek Treating Physician/Extender: Frann Rider in Treatment: 10 Constitutional . Pulse regular. Respirations normal and unlabored. Afebrile. . Eyes Nonicteric. Reactive to light. Ears, Nose, Mouth, and Throat Lips, teeth, and gums WNL.Marland Kitchen Moist mucosa without lesions. Neck supple and nontender. No palpable supraclavicular or cervical adenopathy. Normal sized without goiter. Respiratory WNL. No retractions.. Breath sounds WNL, No rubs, rales, rhonchi, or wheeze.. Cardiovascular Heart rhythm and rate regular, no murmur or gallop.. Pedal Pulses WNL. No clubbing, cyanosis or edema. Lymphatic No adneopathy. No adenopathy. No adenopathy. Musculoskeletal Adexa without tenderness or enlargement.. Digits and nails w/o clubbing, cyanosis, infection, petechiae, ischemia, or inflammatory conditions.. Integumentary (Hair, Skin) No suspicious lesions. No  crepitus or fluctuance. No peri-wound warmth or erythema. No masses.Marland Kitchen Psychiatric Judgement and insight Intact.. No evidence of depression, anxiety, or agitation.. Notes he has again collected some subcutaneous debris and had to sharply remove it with a #3 curet with minimal tenderness and minimal bleeding Electronic Signature(s) Signed: 12/08/2015 3:51:51 PM By: Christin Fudge MD, FACS Entered By: Christin Fudge on 12/08/2015 15:51:50 [name] ([id number]) -------------------------------------------------------------------------------- Physician Orders Details Patient Name: [name], Tyger B. Date of Service: 12/08/2015 3:00 PM Medical Record Number: [id number] Patient Account Number: [id number] Date of Birth/Sex: [birth year] (70 y.o. Male) Treating RN: Montey Hora Primary Care Physician: Fulton Reek Other Clinician: Referring Physician: Fulton Reek Treating Physician/Extender: Frann Rider in Treatment: 10 Verbal / Phone Orders: Yes Clinician: Montey Hora Read Back and Verified: Yes Diagnosis Coding Wound Cleansing Wound #2 Sacrum o Clean wound with Normal Saline. o Cleanse wound with mild soap and water o May Shower, gently pat wound dry prior to applying new dressing. Skin Barriers/Peri-Wound Care Wound #2 Sacrum o Skin Prep Primary Wound Dressing Wound #2 Sacrum o Prisma Ag - moisten with saline Secondary Dressing Wound #2 Sacrum o Dry Gauze o Boardered Foam Dressing Dressing Change Frequency Wound #2 Sacrum o Change dressing every other day. Follow-up Appointments Wound #2 Sacrum o Return Appointment in 1 week. Off-Loading Wound #2 Sacrum o Turn and reposition every 2 hours Additional Orders / Instructions Wound #2 Sacrum o Stop Smoking o Increase protein intake. ARYEN, KISE ([id number]) Medications-please add to medication list. Wound #2 Sacrum o Other: - Vitamin C, Zinc,  Multivitamins Electronic Signature(s) Signed: 12/08/2015 4:23:27 PM By: Christin Fudge MD, FACS Signed: 12/08/2015 4:52:28 PM By: Montey Hora Entered By: Montey Hora on 12/08/2015 15:38:25 [name] ([id number]) -------------------------------------------------------------------------------- Problem List Details Patient Name: [name], Demetrick B. Date of Service: 12/08/2015 3:00 PM Medical Record Number: [id number] Patient Account Number: [id number] Date of Birth/Sex: November 28, 1945 (70 y.o. Male) Treating RN: Montey Hora Primary Care Physician: Fulton Reek Other Clinician: Referring Physician: Fulton Reek Treating Physician/Extender: Frann Rider in Treatment: 10 Active Problems ICD-10 Encounter Code Description Active Date Diagnosis L89.153 Pressure ulcer of sacral region, stage 3 09/29/2015 Yes F17.218 Nicotine dependence, cigarettes, with other nicotine- 09/29/2015 Yes induced disorders G89.4 Chronic pain syndrome 09/29/2015 Yes L89.313 Pressure ulcer of right buttock, stage 3 10/20/2015 Yes Inactive Problems Resolved Problems Electronic Signature(s) Signed: 12/08/2015 3:50:43  PM By: Christin Fudge MD, FACS Entered By: Christin Fudge on 12/08/2015 15:50:42 [name] ([id number]) -------------------------------------------------------------------------------- Progress Note Details Patient Name: [name], Koleton B. Date of Service: 12/08/2015 3:00 PM Medical Record Number: [id number] Patient Account Number: [id number] Date of Birth/Sex: 1945/12/07 (70 y.o. Male) Treating RN: Montey Hora Primary Care Physician: Fulton Reek Other Clinician: Referring Physician: Fulton Reek Treating Physician/Extender: Frann Rider in Treatment: 10 Subjective Chief Complaint Information obtained from Patient Patient presents to the wound care center for a consult due non healing wound for about 4 months now History of Present Illness (HPI) The  following HPI elements were documented for the patient's wound: Location: ulcerated areas in the region of the sacrum bilaterally Quality: Patient reports experiencing a shooting pain to affected area(s). Severity: Patient states wound are getting worse. Duration: Patient has had the wound for > 3 months prior to seeking treatment at the wound center Timing: Pain in wound is constant (hurts all the time) Context: The wound would happen gradually Modifying Factors: Other treatment(s) tried include:has been treated by his PCP and has already been on chronic pain medications Associated Signs and Symptoms: Patient reports having difficulty standing for long periods. 70 year old gentleman who was referred to [place] by his PCP Dr. Fulton Reek for decubitus ulcers in the region of the sacrum which are very painful and these had them for about 4 months now. The patient's past medical history includes aortic stenosis, atrial fibrillation, coronary artery disease, CHF, COPD, coronary artery disease, prediabetes, peripheral vascular disease, nicotine addiction. In the past he's had carotid artery angioplasty and coronary artery bypass graft and VSD repair. He now has a chronic hip condition which is causing him a lot of pain and he goes to a pain clinic for a long while for this. He's been a smoker for over 40 years and smokes about a pack of cigarettes a day. His last hemoglobin A1c done in February of this year was 5.7. 10/06/2015 -- he continues to have a lot of pain around his decubitus ulcers and other than that has been trying his best to offload. He is also working on his smoking cessation. Objective Constitutional Pulse regular. Respirations normal and unlabored. Afebrile. Vitals Time Taken: 3:27 PM, Height: 68 in, Weight: 175 lbs, BMI: 26.6, Temperature: 98.2 F, Pulse: 70 Delude, Mujtaba B. ([id number]) bpm, Respiratory Rate: 18 breaths/min, Blood Pressure: 109/40 mmHg. Eyes Nonicteric.  Reactive to light. Ears, Nose, Mouth, and Throat Lips, teeth, and gums WNL.Marland Kitchen Moist mucosa without lesions. Neck supple and nontender. No palpable supraclavicular or cervical adenopathy. Normal sized without goiter. Respiratory WNL. No retractions.. Breath sounds WNL, No rubs, rales, rhonchi, or wheeze.. Cardiovascular Heart rhythm and rate regular, no murmur or gallop.. Pedal Pulses WNL. No clubbing, cyanosis or edema. Lymphatic No adneopathy. No adenopathy. No adenopathy. Musculoskeletal Adexa without tenderness or enlargement.. Digits and nails w/o clubbing, cyanosis, infection, petechiae, ischemia, or inflammatory conditions.Marland Kitchen Psychiatric Judgement and insight Intact.. No evidence of depression, anxiety, or agitation.. General Notes: he has again collected some subcutaneous debris and had to sharply remove it with a #3 curet with minimal tenderness and minimal bleeding Integumentary (Hair, Skin) No suspicious lesions. No crepitus or fluctuance. No peri-wound warmth or erythema. No masses.. Wound #2 status is Open. Original cause of wound was Gradually Appeared. The wound is located on the Sacrum. The wound measures 0.5cm length x 0.2cm width x 0.1cm depth; 0.079cm^2 area and 0.008cm^3 volume. The wound is limited to skin breakdown. There is no tunneling or  undermining noted. There is a large amount of serous drainage noted. The wound margin is flat and intact. There is no granulation within the wound bed. There is a large (67-100%) amount of necrotic tissue within the wound bed including Adherent Slough. The periwound skin appearance exhibited: Moist, Erythema. The surrounding wound skin color is noted with erythema which is circumferential. Periwound temperature was noted as No Abnormality. The periwound has tenderness on palpation. Assessment Shade, Mccalip Mearle B. ([id number]) Active Problems ICD-10 L89.153 - Pressure ulcer of sacral region, stage 3 F17.218 - Nicotine dependence,  cigarettes, with other nicotine-induced disorders G89.4 - Chronic pain syndrome L89.313 - Pressure ulcer of right buttock, stage 3 Procedures Wound #2 Wound #2 is a Pressure Ulcer located on the Sacrum . There was a Skin/Subcutaneous Tissue Debridement [id number]) debridement with total area of 0.1 sq cm performed by Christin Fudge, MD. with the following instrument(s): Curette to remove Viable and Non-Viable tissue/material including Fibrin/Slough and Subcutaneous after achieving pain control using Lidocaine 4% Topical Solution. A time out was conducted prior to the start of the procedure. A Minimum amount of bleeding was controlled with Pressure. The procedure was tolerated well with a pain level of 0 throughout and a pain level of 0 following the procedure. Post Debridement Measurements: 0.5cm length x 0.2cm width x 0.1cm depth; 0.008cm^3 volume. Post debridement Stage noted as Category/Stage II. Post procedure Diagnosis Wound #2: Same as Pre-Procedure Plan Wound Cleansing: Wound #2 Sacrum: Clean wound with Normal Saline. Cleanse wound with mild soap and water May Shower, gently pat wound dry prior to applying new dressing. Skin Barriers/Peri-Wound Care: Wound #2 Sacrum: Skin Prep Primary Wound Dressing: Wound #2 Sacrum: Prisma Ag - moisten with saline Secondary Dressing: Wound #2 Sacrum: Dry Gauze Boardered Foam Dressing Dressing Change Frequency: Wound #2 Sacrum: Whiteside, Bartlett B. ([id number]) Change dressing every other day. Follow-up Appointments: Wound #2 Sacrum: Return Appointment in 1 week. Off-Loading: Wound #2 Sacrum: Turn and reposition every 2 hours Additional Orders / Instructions: Wound #2 Sacrum: Stop Smoking Increase protein intake. Medications-please add to medication list.: Wound #2 Sacrum: Other: - Vitamin C, Zinc, Multivitamins We will continue with RTD foam. the dressing can be changed about twice this week Electronic Signature(s) Signed:  12/08/2015 4:27:33 PM By: Christin Fudge MD, FACS Previous Signature: 12/08/2015 4:27:23 PM Version By: Christin Fudge MD, FACS Previous Signature: 12/08/2015 3:52:51 PM Version By: Christin Fudge MD, FACS Entered By: Christin Fudge on 12/08/2015 16:27:33 [name] ([id number]) -------------------------------------------------------------------------------- SuperBill Details Patient Name: [name], Asiel B. Date of Service: 12/08/2015 Medical Record Number: [id number] Patient Account Number: [id number] Date of Birth/Sex: Aug 31, 1945 (70 y.o. Male) Treating RN: Montey Hora Primary Care Physician: Fulton Reek Other Clinician: Referring Physician: Fulton Reek Treating Physician/Extender: Frann Rider in Treatment: 10 Diagnosis Coding ICD-10 Codes Code Description [phone] Pressure ulcer of sacral region, stage 3 F17.218 Nicotine dependence, cigarettes, with other nicotine-induced disorders G89.4 Chronic pain syndrome L89.313 Pressure ulcer of right buttock, stage 3 Facility Procedures CPT4 Code: [id number] Description: 11042 - DEB SUBQ TISSUE 20 SQ CM/< ICD-10 Description Diagnosis L89.153 Pressure ulcer of sacral region, stage 3 G89.4 Chronic pain syndrome Modifier: Quantity: 1 Physician Procedures CPT4 Code: [id number] Description: 11042 - WC PHYS SUBQ TISS 20 SQ CM ICD-10 Description Diagnosis L89.153 Pressure ulcer of sacral region, stage 3 G89.4 Chronic pain syndrome Modifier: Quantity: 1 Electronic Signature(s) Signed: 12/08/2015 3:53:38 PM By: Christin Fudge MD, FACS Entered By: Christin Fudge on 12/08/2015 15:53:37

## 2015-12-15 ENCOUNTER — Encounter: Payer: Medicare Other | Admitting: Surgery

## 2015-12-15 DIAGNOSIS — L89153 Pressure ulcer of sacral region, stage 3: Secondary | ICD-10-CM | POA: Diagnosis not present

## 2015-12-16 NOTE — Progress Notes (Signed)
NAHSIR, IULIANO ([id number]) Visit Report for 12/15/2015 Chief Complaint Document Details Patient Name: [name] Date of Service: 12/15/2015 2:15 PM Medical Record Number: [id number] Patient Account Number: [id number] Date of Birth/Sex: Jan 06, 1946 (70 y.o. Male) Treating RN: Carole Civil Primary Care Physician: Fulton Reek Other Clinician: Referring Physician: Fulton Reek Treating Physician/Extender: Frann Rider in Treatment: 11 Information Obtained from: Patient Chief Complaint Patient presents to the wound care center for a consult due non healing wound for about 4 months now Electronic Signature(s) Signed: 12/15/2015 3:18:07 PM By: Christin Fudge MD, FACS Entered By: Christin Fudge on 12/15/2015 15:18:07 [name] ([id number]) -------------------------------------------------------------------------------- Debridement Details Patient Name: [name] [name] Date of Service: 12/15/2015 2:15 PM Medical Record Number: [id number] Patient Account Number: [id number] Date of Birth/Sex: Mar 10, 1946 (70 y.o. Male) Treating RN: Carole Civil Primary Care Physician: Fulton Reek Other Clinician: Referring Physician: Fulton Reek Treating Physician/Extender: Frann Rider in Treatment: 11 Debridement Performed for Wound #2 Sacrum Assessment: Performed By: Physician Christin Fudge, MD Debridement: Debridement Pre-procedure Yes - 15:03 Verification/Time Out Taken: Start Time: 15:03 Pain Control: Lidocaine 4% Topical Solution Level: Skin/Subcutaneous Tissue Total Area Debrided (L x 0.6 (cm) x 0.2 (cm) = 0.12 (cm) W): Tissue and other Viable, Non-Viable, Fibrin/Slough, Skin, Subcutaneous material debrided: Instrument: Curette Bleeding: Minimum Hemostasis Achieved: Pressure End Time: 15:04 Procedural Pain: 6 Post Procedural Pain: 5 Response to Treatment: Procedure was tolerated well Post Debridement Measurements of Total  Wound Length: (cm) 0.6 Stage: Category/Stage II Width: (cm) 0.2 Depth: (cm) 0.1 Volume: (cm) 0.009 Character of Wound/Ulcer Post Improved Debridement: Severity of Tissue Post Fat layer exposed Debridement: Post Procedure Diagnosis Same as Pre-procedure Electronic Signature(s) Signed: 12/15/2015 3:17:59 PM By: Christin Fudge MD, FACS Signed: 12/15/2015 4:58:52 PM By: Carole Civil Entered By: Christin Fudge on 12/15/2015 15:17:59 [name] ([id number]) [name], [name] ([id number]) -------------------------------------------------------------------------------- HPI Details Patient Name: [name] [name] Date of Service: 12/15/2015 2:15 PM Medical Record Number: [id number] Patient Account Number: [id number] Date of Birth/Sex: April 12, 1946 (70 y.o. Male) Treating RN: Carole Civil Primary Care Physician: Fulton Reek Other Clinician: Referring Physician: Fulton Reek Treating Physician/Extender: Frann Rider in Treatment: 11 History of Present Illness Location: ulcerated areas in the region of the sacrum bilaterally Quality: Patient reports experiencing a shooting pain to affected area(s). Severity: Patient states wound are getting worse. Duration: Patient has had the wound for > 3 months prior to seeking treatment at the wound center Timing: Pain in wound is constant (hurts all the time) Context: The wound would happen gradually Modifying Factors: Other treatment(s) tried include:has been treated by his PCP and has already been on chronic pain medications Associated Signs and Symptoms: Patient reports having difficulty standing for long periods. HPI Description: 70 year old gentleman who was referred to [place] by his PCP Dr. Fulton Reek for decubitus ulcers in the region of the sacrum which are very painful and these had them for about 4 months now. The patient's past medical history includes aortic stenosis, atrial fibrillation, coronary artery disease,  CHF, COPD, coronary artery disease, prediabetes, peripheral vascular disease, nicotine addiction. In the past he's had carotid artery angioplasty and coronary artery bypass graft and VSD repair. He now has a chronic hip condition which is causing him a lot of pain and he goes to a pain clinic for a long while for this. He's been a smoker for over 40 years and smokes about a pack of cigarettes a day. His last hemoglobin A1c done in February of this year was 5.7.  10/06/2015 -- he continues to have a lot of pain around his decubitus ulcers and other than that has been trying his best to offload. He is also working on his smoking cessation. Electronic Signature(s) Signed: 12/15/2015 3:18:15 PM By: Christin Fudge MD, FACS Entered By: Christin Fudge on 12/15/2015 15:18:15 [name] ([id number]) -------------------------------------------------------------------------------- Physical Exam Details Patient Name: [name] [name] Date of Service: 12/15/2015 2:15 PM Medical Record Number: [id number] Patient Account Number: [id number] Date of Birth/Sex: 19-May-1945 (70 y.o. Male) Treating RN: Carole Civil Primary Care Physician: Fulton Reek Other Clinician: Referring Physician: Fulton Reek Treating Physician/Extender: Frann Rider in Treatment: 11 Constitutional . Pulse regular. Respirations normal and unlabored. Afebrile. . Eyes Nonicteric. Reactive to light. Ears, Nose, Mouth, and Throat Lips, teeth, and gums WNL.Marland Kitchen Moist mucosa without lesions. Neck supple and nontender. No palpable supraclavicular or cervical adenopathy. Normal sized without goiter. Respiratory WNL. No retractions.. Cardiovascular Pedal Pulses WNL. No clubbing, cyanosis or edema. Lymphatic No adneopathy. No adenopathy. No adenopathy. Musculoskeletal Adexa without tenderness or enlargement.. Digits and nails w/o clubbing, cyanosis, infection, petechiae, ischemia, or inflammatory  conditions.. Integumentary (Hair, Skin) No suspicious lesions. No crepitus or fluctuance. No peri-wound warmth or erythema. No masses.Marland Kitchen Psychiatric Judgement and insight Intact.. No evidence of depression, anxiety, or agitation.. Notes he required sharp debridement with a #3 curet to remove some of the subcutaneous debris. No bleeding. Electronic Signature(s) Signed: 12/15/2015 3:18:58 PM By: Christin Fudge MD, FACS Entered By: Christin Fudge on 12/15/2015 15:18:58 [name] ([id number]) -------------------------------------------------------------------------------- Physician Orders Details Patient Name: [name] [name] Date of Service: 12/15/2015 2:15 PM Medical Record Number: [id number] Patient Account Number: [id number] Date of Birth/Sex: August 04, 1945 (70 y.o. Male) Treating RN: Carole Civil Primary Care Physician: Fulton Reek Other Clinician: Referring Physician: Fulton Reek Treating Physician/Extender: Frann Rider in Treatment: 53 Verbal / Phone Orders: No Diagnosis Coding Wound Cleansing Wound #2 Sacrum o Clean wound with Normal Saline. o Cleanse wound with mild soap and water o May Shower, gently pat wound dry prior to applying new dressing. Skin Barriers/Peri-Wound Care Wound #2 Sacrum o Skin Prep Primary Wound Dressing Wound #2 Sacrum o RTD Secondary Dressing Wound #2 Sacrum o Dry Gauze o Boardered Foam Dressing Dressing Change Frequency Wound #2 Sacrum o Change dressing every other day. Follow-up Appointments Wound #2 Sacrum o Return Appointment in 1 week. Off-Loading Wound #2 Sacrum o Turn and reposition every 2 hours Additional Orders / Instructions Wound #2 Sacrum o Stop Smoking o Increase protein intake. MAXENCE, LAPRAIRIE ([id number]) Medications-please add to medication list. Wound #2 Sacrum o Other: - Vitamin C, Zinc, Multivitamins Electronic Signature(s) Signed: 12/15/2015 4:08:58 PM By:  Christin Fudge MD, FACS Signed: 12/15/2015 4:58:52 PM By: Carole Civil Entered By: Carole Civil on 12/15/2015 15:18:36 [name] ([id number]) -------------------------------------------------------------------------------- Problem List Details Patient Name: [name] [name] Date of Service: 12/15/2015 2:15 PM Medical Record Number: [id number] Patient Account Number: [id number] Date of Birth/Sex: 10-Aug-1945 (70 y.o. Male) Treating RN: Carole Civil Primary Care Physician: Fulton Reek Other Clinician: Referring Physician: Fulton Reek Treating Physician/Extender: Frann Rider in Treatment: 11 Active Problems ICD-10 Encounter Code Description Active Date Diagnosis L89.153 Pressure ulcer of sacral region, stage 3 09/29/2015 Yes F17.218 Nicotine dependence, cigarettes, with other nicotine- 09/29/2015 Yes induced disorders G89.4 Chronic pain syndrome 09/29/2015 Yes Inactive Problems Resolved Problems ICD-10 Code Description Active Date Resolved Date L89.313 Pressure ulcer of right buttock, stage 3 10/20/2015 10/20/2015 Electronic Signature(s) Signed: 12/15/2015 3:17:39 PM By: Christin Fudge MD, FACS Entered By: Con Memos  Brolin Dambrosia on 12/15/2015 15:17:39 [name] [name] Kitchen ([id number]) -------------------------------------------------------------------------------- Progress Note Details Patient Name: [name] Date of Service: 12/15/2015 2:15 PM Medical Record Number: [id number] Patient Account Number: [id number] Date of Birth/Sex: 25-Apr-1946 (70 y.o. Male) Treating RN: Carole Civil Primary Care Physician: Fulton Reek Other Clinician: Referring Physician: Fulton Reek Treating Physician/Extender: Frann Rider in Treatment: 11 Subjective Chief Complaint Information obtained from Patient Patient presents to the wound care center for a consult due non healing wound for about 4 months now History of Present Illness (HPI) The following HPI  elements were documented for the patient's wound: Location: ulcerated areas in the region of the sacrum bilaterally Quality: Patient reports experiencing a shooting pain to affected area(s). Severity: Patient states wound are getting worse. Duration: Patient has had the wound for > 3 months prior to seeking treatment at the wound center Timing: Pain in wound is constant (hurts all the time) Context: The wound would happen gradually Modifying Factors: Other treatment(s) tried include:has been treated by his PCP and has already been on chronic pain medications Associated Signs and Symptoms: Patient reports having difficulty standing for long periods. 70 year old gentleman who was referred to [place] by his PCP Dr. Fulton Reek for decubitus ulcers in the region of the sacrum which are very painful and these had them for about 4 months now. The patient's past medical history includes aortic stenosis, atrial fibrillation, coronary artery disease, CHF, COPD, coronary artery disease, prediabetes, peripheral vascular disease, nicotine addiction. In the past he's had carotid artery angioplasty and coronary artery bypass graft and VSD repair. He now has a chronic hip condition which is causing him a lot of pain and he goes to a pain clinic for a long while for this. He's been a smoker for over 40 years and smokes about a pack of cigarettes a day. His last hemoglobin A1c done in February of this year was 5.7. 10/06/2015 -- he continues to have a lot of pain around his decubitus ulcers and other than that has been trying his best to offload. He is also working on his smoking cessation. Objective Constitutional Pulse regular. Respirations normal and unlabored. Afebrile. Vitals Time Taken: 2:44 PM, Height: 68 in, Weight: 175 lbs, BMI: 26.6, Temperature: 98.5 F, Pulse: 71 [name] ([id number]) bpm, Respiratory Rate: 16 breaths/min, Blood Pressure: 117/53 mmHg. Eyes Nonicteric. Reactive to  light. Ears, Nose, Mouth, and Throat Lips, teeth, and gums WNL.Marland Kitchen Moist mucosa without lesions. Neck supple and nontender. No palpable supraclavicular or cervical adenopathy. Normal sized without goiter. Respiratory WNL. No retractions.. Cardiovascular Pedal Pulses WNL. No clubbing, cyanosis or edema. Lymphatic No adneopathy. No adenopathy. No adenopathy. Musculoskeletal Adexa without tenderness or enlargement.. Digits and nails w/o clubbing, cyanosis, infection, petechiae, ischemia, or inflammatory conditions.Marland Kitchen Psychiatric Judgement and insight Intact.. No evidence of depression, anxiety, or agitation.. General Notes: he required sharp debridement with a #3 curet to remove some of the subcutaneous debris. No bleeding. Integumentary (Hair, Skin) No suspicious lesions. No crepitus or fluctuance. No peri-wound warmth or erythema. No masses.. Wound #2 status is Open. Original cause of wound was Gradually Appeared. The wound is located on the Sacrum. The wound measures 0.6cm length x 0.2cm width x 0.1cm depth; 0.094cm^2 area and 0.009cm^3 volume. The wound is limited to skin breakdown. There is no tunneling or undermining noted. There is a small amount of serous drainage noted. The wound margin is flat and intact. There is no granulation within the wound bed. There is a large (67-100%) amount of  necrotic tissue within the wound bed including Adherent Slough. The periwound skin appearance exhibited: Moist. The periwound skin appearance did not exhibit: Erythema. Periwound temperature was noted as No Abnormality. The periwound has tenderness on palpation. Assessment [name] [name] ([id number]) Active Problems ICD-10 L89.153 - Pressure ulcer of sacral region, stage 3 F17.218 - Nicotine dependence, cigarettes, with other nicotine-induced disorders G89.4 - Chronic pain syndrome Procedures Wound #2 Wound #2 is a Pressure Ulcer located on the Sacrum . There was a Skin/Subcutaneous  Tissue Debridement [id number]) debridement with total area of 0.12 sq cm performed by Christin Fudge, MD. with the following instrument(s): Curette to remove Viable and Non-Viable tissue/material including Fibrin/Slough, Skin, and Subcutaneous after achieving pain control using Lidocaine 4% Topical Solution. A time out was conducted at 15:03, prior to the start of the procedure. A Minimum amount of bleeding was controlled with Pressure. The procedure was tolerated well with a pain level of 6 throughout and a pain level of 5 following the procedure. Post Debridement Measurements: 0.6cm length x 0.2cm width x 0.1cm depth; 0.009cm^3 volume. Post debridement Stage noted as Category/Stage II. Character of Wound/Ulcer Post Debridement is improved. Severity of Tissue Post Debridement is: Fat layer exposed. Post procedure Diagnosis Wound #2: Same as Pre-Procedure Plan Wound Cleansing: Wound #2 Sacrum: Clean wound with Normal Saline. Cleanse wound with mild soap and water May Shower, gently pat wound dry prior to applying new dressing. Skin Barriers/Peri-Wound Care: Wound #2 Sacrum: Skin Prep Primary Wound Dressing: Wound #2 Sacrum: RTD Secondary Dressing: Wound #2 Sacrum: Dry Gauze Boardered Foam Dressing Dressing Change Frequency: [name] [name] ([id number]) Wound #2 Sacrum: Change dressing every other day. Follow-up Appointments: Wound #2 Sacrum: Return Appointment in 1 week. Off-Loading: Wound #2 Sacrum: Turn and reposition every 2 hours Additional Orders / Instructions: Wound #2 Sacrum: Stop Smoking Increase protein intake. Medications-please add to medication list.: Wound #2 Sacrum: Other: - Vitamin C, Zinc, Multivitamins We will continue with RTD foam. the dressing can be changed about twice this week Electronic Signature(s) Signed: 12/15/2015 3:19:44 PM By: Christin Fudge MD, FACS Entered By: Christin Fudge on 12/15/2015 15:19:44 [name]  ([id number]) -------------------------------------------------------------------------------- SuperBill Details Patient Name: [name] [name] Date of Service: 12/15/2015 Medical Record Number: [id number] Patient Account Number: [id number] Date of Birth/Sex: 1945-05-31 (70 y.o. Male) Treating RN: Carole Civil Primary Care Physician: Fulton Reek Other Clinician: Referring Physician: Fulton Reek Treating Physician/Extender: Frann Rider in Treatment: 11 Diagnosis Coding ICD-10 Codes Code Description [phone] Pressure ulcer of sacral region, stage 3 F17.218 Nicotine dependence, cigarettes, with other nicotine-induced disorders G89.4 Chronic pain syndrome Facility Procedures CPT4 Code Description: [id number] - WOUND CARE VISIT-LEV 1 EST PT Modifier: Quantity: 1 CPT4 Code Description: [id number] - DEB SUBQ TISSUE 20 SQ CM/< ICD-10 Description Diagnosis L89.153 Pressure ulcer of sacral region, stage 3 F17.218 Nicotine dependence, cigarettes, with other nicotin G89.4 Chronic pain syndrome Modifier: e-induced di Quantity: 1 sorders Physician Procedures CPT4 Code Description: [id number] - WC PHYS SUBQ TISS 20 SQ CM ICD-10 Description Diagnosis L89.153 Pressure ulcer of sacral region, stage 3 F17.218 Nicotine dependence, cigarettes, with other nicoti G89.4 Chronic pain syndrome Modifier: ne-induced di Quantity: 1 sorders Electronic Signature(s) Signed: 12/15/2015 3:19:54 PM By: Christin Fudge MD, FACS Entered By: Christin Fudge on 12/15/2015 15:19:54

## 2015-12-16 NOTE — Progress Notes (Signed)
[name] ([id number]) Visit Report for 12/15/2015 Arrival Information Details Patient Name: [name], [name] Date of Service: 12/15/2015 2:15 PM Medical Record Number: [id number] Patient Account Number: [id number] Date of Birth/Sex: [birth year] (70 y.o. Male) Treating RN: Carole Civil Primary Care Physician: Fulton Reek Other Clinician: Referring Physician: Fulton Reek Treating Physician/Extender: Frann Rider in Treatment: 11 Visit Information History Since Last Visit All ordered tests and consults were completed: No Patient Arrived: Ambulatory Added or deleted any medications: No Arrival Time: 14:41 Any new allergies or adverse reactions: No Accompanied By: wife Had a fall or experienced change in No Transfer Assistance: None activities of daily living that may affect Patient Identification Verified: Yes risk of falls: Secondary Verification Process Yes Signs or symptoms of abuse/neglect since last No Completed: visito Patient Requires Transmission-Based No Hospitalized since last visit: No Precautions: Has Dressing in Place as Prescribed: Yes Patient Has Alerts: No Pain Present Now: Yes Electronic Signature(s) Signed: 12/15/2015 4:58:52 PM By: Carole Civil Entered By: Carole Civil on 12/15/2015 14:42:48 [name] ([id number]) -------------------------------------------------------------------------------- Clinic Level of Care Assessment Details Patient Name: [name], [name] Date of Service: 12/15/2015 2:15 PM Medical Record Number: [id number] Patient Account Number: [id number] Date of Birth/Sex: 05-Oct-1945 (70 y.o. Male) Treating RN: Carole Civil Primary Care Physician: Fulton Reek Other Clinician: Referring Physician: Fulton Reek Treating Physician/Extender: Frann Rider in Treatment: 11 Clinic Level of Care Assessment Items TOOL 4 Quantity Score X - Use when only an EandM is performed on FOLLOW-UP visit 1  0 ASSESSMENTS - Nursing Assessment / Reassessment X - Reassessment of Co-morbidities (includes updates in patient status) 1 10 X - Reassessment of Adherence to Treatment Plan 1 5 ASSESSMENTS - Wound and Skin Assessment / Reassessment X - Simple Wound Assessment / Reassessment - one wound 1 5 []  - Complex Wound Assessment / Reassessment - multiple wounds 0 []  - Dermatologic / Skin Assessment (not related to wound area) 0 ASSESSMENTS - Focused Assessment []  - Circumferential Edema Measurements - multi extremities 0 []  - Nutritional Assessment / Counseling / Intervention 0 []  - Lower Extremity Assessment (monofilament, tuning fork, pulses) 0 []  - Peripheral Arterial Disease Assessment (using hand held doppler) 0 ASSESSMENTS - Ostomy and/or Continence Assessment and Care []  - Incontinence Assessment and Management 0 []  - Ostomy Care Assessment and Management (repouching, etc.) 0 PROCESS - Coordination of Care []  - Simple Patient / Family Education for ongoing care 0 []  - Complex (extensive) Patient / Family Education for ongoing care 0 []  - Staff obtains Programmer, systems, Records, Test Results / Process Orders 0 []  - Staff telephones HHA, Nursing Homes / Clarify orders / etc 0 []  - Routine Transfer to another Facility (non-emergent condition) 0 Giammona, Laura. ([id number]) []  - Routine Hospital Admission (non-emergent condition) 0 []  - New Admissions / Biomedical engineer / Ordering NPWT, Apligraf, etc. 0 []  - Emergency Hospital Admission (emergent condition) 0 []  - Simple Discharge Coordination 0 []  - Complex (extensive) Discharge Coordination 0 PROCESS - Special Needs []  - Pediatric / Minor Patient Management 0 []  - Isolation Patient Management 0 []  - Hearing / Language / Visual special needs 0 []  - Assessment of Community assistance (transportation, D/C planning, etc.) 0 []  - Additional assistance / Altered mentation 0 []  - Support Surface(s) Assessment (bed, cushion, seat, etc.)  0 INTERVENTIONS - Wound Cleansing / Measurement []  - Simple Wound Cleansing - one wound 0 []  - Complex Wound Cleansing - multiple wounds 0 []  - Wound Imaging (photographs - any number of  wounds) 0 []  - Wound Tracing (instead of photographs) 0 []  - Simple Wound Measurement - one wound 0 []  - Complex Wound Measurement - multiple wounds 0 INTERVENTIONS - Wound Dressings []  - Small Wound Dressing one or multiple wounds 0 []  - Medium Wound Dressing one or multiple wounds 0 []  - Large Wound Dressing one or multiple wounds 0 []  - Application of Medications - topical 0 []  - Application of Medications - injection 0 INTERVENTIONS - Miscellaneous []  - External ear exam 0 Wetherell, Dominyk B. ([id number]) []  - Specimen Collection (cultures, biopsies, blood, body fluids, etc.) 0 []  - Specimen(s) / Culture(s) sent or taken to Lab for analysis 0 []  - Patient Transfer (multiple staff / Harrel Lemon Lift / Similar devices) 0 []  - Simple Staple / Suture removal (25 or less) 0 []  - Complex Staple / Suture removal (26 or more) 0 []  - Hypo / Hyperglycemic Management (close monitor of Blood Glucose) 0 []  - Ankle / Brachial Index (ABI) - do not check if billed separately 0 []  - Vital Signs 0 Has the patient been seen at the hospital within the last three years: Yes Total Score: 20 Level Of Care: New/Established - Level 1 Electronic Signature(s) Signed: 12/15/2015 4:58:52 PM By: Carole Civil Entered By: Carole Civil on 12/15/2015 15:17:24 [name] ([id number]) -------------------------------------------------------------------------------- Encounter Discharge Information Details Patient Name: [name], [name] Date of Service: 12/15/2015 2:15 PM Medical Record Number: [id number] Patient Account Number: [id number] Date of Birth/Sex: Aug 29, 1945 (70 y.o. Male) Treating RN: Carole Civil Primary Care Physician: Fulton Reek Other Clinician: Referring Physician: Fulton Reek Treating  Physician/Extender: Frann Rider in Treatment: 11 Encounter Discharge Information Items Discharge Pain Level: 2 Discharge Condition: Stable Ambulatory Status: Ambulatory Discharge Destination: Home Transportation: Private Auto Accompanied By: wife Schedule Follow-up Appointment: Yes Medication Reconciliation completed Yes and provided to Patient/Care Odis Turck: Patient Clinical Summary of Care: Declined Electronic Signature(s) Signed: 12/15/2015 4:58:52 PM By: Carole Civil Entered By: Carole Civil on 12/15/2015 15:21:47 [name] ([id number]) -------------------------------------------------------------------------------- Lower Extremity Assessment Details Patient Name: [name], Billey B. Date of Service: 12/15/2015 2:15 PM Medical Record Number: [id number] Patient Account Number: [id number] Date of Birth/Sex: 1945-10-27 (70 y.o. Male) Treating RN: Carole Civil Primary Care Physician: Fulton Reek Other Clinician: Referring Physician: Fulton Reek Treating Physician/Extender: Frann Rider in Treatment: 11 Electronic Signature(s) Signed: 12/15/2015 4:58:52 PM By: Carole Civil Entered By: Carole Civil on 12/15/2015 14:46:23 [name] ([id number]) -------------------------------------------------------------------------------- Multi Wound Chart Details Patient Name: [name], Kirsten B. Date of Service: 12/15/2015 2:15 PM Medical Record Number: [id number] Patient Account Number: [id number] Date of Birth/Sex: [birth year] (70 y.o. Male) Treating RN: Carole Civil Primary Care Physician: Fulton Reek Other Clinician: Referring Physician: Fulton Reek Treating Physician/Extender: Frann Rider in Treatment: 11 Vital Signs Height(in): 68 Pulse(bpm): 71 Weight(lbs): 175 Blood Pressure 117/53 (mmHg): Body Mass Index(BMI): 27 Temperature(F): 98.5 Respiratory Rate 16 (breaths/min): Photos: [2:No Photos] [N/A:N/A] Wound  Location: [2:Sacrum] [N/A:N/A] Wounding Event: [2:Gradually Appeared] [N/A:N/A] Primary Etiology: [2:Pressure Ulcer] [N/A:N/A] Comorbid History: [2:Chronic Obstructive Pulmonary Disease (COPD), Arrhythmia, Congestive Heart Failure, Coronary Artery Disease, Hypertension, Peripheral Venous Disease, Osteoarthritis] [N/A:N/A] Date Acquired: [2:06/01/2015] [N/A:N/A] Weeks of Treatment: [2:11] [N/A:N/A] Wound Status: [2:Open] [N/A:N/A] Measurements L x W x D 0.6x0.2x0.1 [N/A:N/A] (cm) Area (cm) : [2:0.094] [N/A:N/A] Volume (cm) : [2:0.009] [N/A:N/A] % Reduction in Area: [2:99.40%] [N/A:N/A] % Reduction in Volume: 99.40% [N/A:N/A] Classification: [2:Category/Stage II] [N/A:N/A] Exudate Amount: [2:Small] [N/A:N/A] Exudate Type: [2:Serous] [N/A:N/A] Exudate Color: [2:amber] [N/A:N/A] Wound Margin: [2:Flat and Intact] [N/A:N/A] Granulation Amount: [  2:None Present (0%)] [N/A:N/A] Necrotic Amount: [2:Large (67-100%)] [N/A:N/A] Exposed Structures: [2:Fascia: No Fat: No] [N/A:N/A] Tendon: No Muscle: No Joint: No Bone: No Limited to Skin Breakdown Epithelialization: Medium (34-66%) N/A N/A Periwound Skin Texture: No Abnormalities Noted N/A N/A Periwound Skin Moist: Yes N/A N/A Moisture: Periwound Skin Color: Erythema: No N/A N/A Temperature: No Abnormality N/A N/A Tenderness on Yes N/A N/A Palpation: Wound Preparation: Ulcer Cleansing: N/A N/A Rinsed/Irrigated with Saline Topical Anesthetic Applied: Other: lidocaine 4% Treatment Notes Electronic Signature(s) Signed: 12/15/2015 4:58:52 PM By: Carole Civil Entered By: Carole Civil on 12/15/2015 14:58:01 [name] ([id number]) -------------------------------------------------------------------------------- [facility] Details Patient Name: [name], Zaryan B. Date of Service: 12/15/2015 2:15 PM Medical Record Number: [id number] Patient Account Number: [id number] Date of Birth/Sex: 10/15/1945 (70 y.o.  Male) Treating RN: Carole Civil Primary Care Physician: Fulton Reek Other Clinician: Referring Physician: Fulton Reek Treating Physician/Extender: Frann Rider in Treatment: 26 Active Inactive Orientation to the Wound Care Program Nursing Diagnoses: Knowledge deficit related to the wound healing center program Goals: Patient/caregiver will verbalize understanding of the [facility] Date Initiated: 10/20/2015 Goal Status: Active Interventions: Provide education on orientation to the wound center Notes: Pressure Nursing Diagnoses: Knowledge deficit related to management of pressures ulcers Goals: Patient will remain free from development of additional pressure ulcers Date Initiated: 10/20/2015 Goal Status: Active Interventions: Assess: immobility, friction, shearing, incontinence upon admission and as needed Notes: Electronic Signature(s) Signed: 12/15/2015 4:58:52 PM By: Carole Civil Entered By: Carole Civil on 12/15/2015 14:57:43 [name] ([id number]) -------------------------------------------------------------------------------- Pain Assessment Details Patient Name: [name], Oddie B. Date of Service: 12/15/2015 2:15 PM Medical Record Number: [id number] Patient Account Number: [id number] Date of Birth/Sex: 1945-07-05 (70 y.o. Male) Treating RN: Carole Civil Primary Care Physician: Fulton Reek Other Clinician: Referring Physician: Fulton Reek Treating Physician/Extender: Frann Rider in Treatment: 11 Active Problems Location of Pain Severity and Description of Pain Patient Has Paino Yes Site Locations Pain Location: Pain in Ulcers With Dressing Change: Yes Duration of the Pain. Constant / Intermittento Intermittent Rate the pain. Current Pain Level: 8 Pain Management and Medication Current Pain Management: How does your pain impact your activities of daily livingo Sleep: Yes Appetite: No Electronic  Signature(s) Signed: 12/15/2015 4:58:52 PM By: Carole Civil Entered By: Carole Civil on 12/15/2015 14:43:44 [name] ([id number]) -------------------------------------------------------------------------------- Patient/Caregiver Education Details Patient Name: [name], Llewellyn B. Date of Service: 12/15/2015 2:15 PM Medical Record Number: [id number] Patient Account Number: [id number] Date of Birth/Gender: 07/19/1945 (70 y.o. Male) Treating RN: Carole Civil Primary Care Physician: Fulton Reek Other Clinician: Referring Physician: Fulton Reek Treating Physician/Extender: Frann Rider in Treatment: 11 Education Assessment Education Provided To: Patient and Caregiver Education Topics Provided Wound Debridement: Handouts: Other: useing RTD Methods: Explain/Verbal Responses: State content correctly Electronic Signature(s) Signed: 12/15/2015 4:58:52 PM By: Carole Civil Entered By: Carole Civil on 12/15/2015 15:22:18 [name] ([id number]) -------------------------------------------------------------------------------- Wound Assessment Details Patient Name: [name], Airam B. Date of Service: 12/15/2015 2:15 PM Medical Record Number: [id number] Patient Account Number: [id number] Date of Birth/Sex: 1945-09-17 (70 y.o. Male) Treating RN: Carole Civil Primary Care Physician: Fulton Reek Other Clinician: Referring Physician: Fulton Reek Treating Physician/Extender: Frann Rider in Treatment: 11 Wound Status Wound Number: 2 Primary Pressure Ulcer Etiology: Wound Location: Sacrum Wound Open Wounding Event: Gradually Appeared Status: Date Acquired: 06/01/2015 Comorbid Chronic Obstructive Pulmonary Disease Weeks Of Treatment: 11 History: (COPD), Arrhythmia, Congestive Heart Clustered Wound: No Failure, Coronary Artery Disease, Hypertension, Peripheral Venous Disease, Osteoarthritis Photos Photo Uploaded By:  Montey Hora on  12/15/2015 16:36:51 Wound Measurements Length: (cm) 0.6 Width: (cm) 0.2 Depth: (cm) 0.1 Area: (cm) 0.094 Volume: (cm) 0.009 % Reduction in Area: 99.4% % Reduction in Volume: 99.4% Epithelialization: Medium (34-66%) Tunneling: No Undermining: No Wound Description Classification: Category/Stage II Wound Margin: Flat and Intact Exudate Amount: Small Exudate Type: Serous Exudate Color: amber Foul Odor After Cleansing: No Wound Bed Granulation Amount: None Present (0%) Exposed Structure Necrotic Amount: Large (67-100%) Fascia Exposed: No Barrows, Jaydien B. ([id number]) Necrotic Quality: Adherent Slough Fat Layer Exposed: No Tendon Exposed: No Muscle Exposed: No Joint Exposed: No Bone Exposed: No Limited to Skin Breakdown Periwound Skin Texture Texture Color No Abnormalities Noted: No No Abnormalities Noted: No Erythema: No Moisture No Abnormalities Noted: No Temperature / Pain Moist: Yes Temperature: No Abnormality Tenderness on Palpation: Yes Wound Preparation Ulcer Cleansing: Rinsed/Irrigated with Saline Topical Anesthetic Applied: Other: lidocaine 4%, Electronic Signature(s) Signed: 12/15/2015 4:58:52 PM By: Carole Civil Entered By: Carole Civil on 12/15/2015 14:57:16 [name] ([id number]) -------------------------------------------------------------------------------- Vitals Details Patient Name: [name], Xane B. Date of Service: 12/15/2015 2:15 PM Medical Record Number: [id number] Patient Account Number: [id number] Date of Birth/Sex: May 06, 1945 (70 y.o. Male) Treating RN: Carole Civil Primary Care Physician: Fulton Reek Other Clinician: Referring Physician: Fulton Reek Treating Physician/Extender: Frann Rider in Treatment: 11 Vital Signs Time Taken: 14:44 Temperature (F): 98.5 Height (in): 68 Pulse (bpm): 71 Weight (lbs): 175 Respiratory Rate (breaths/min): 16 Body Mass Index (BMI): 26.6 Blood Pressure (mmHg):  117/53 Reference Range: 80 - 120 mg / dl Electronic Signature(s) Signed: 12/15/2015 4:58:52 PM By: Carole Civil Entered By: Carole Civil on 12/15/2015 14:44:51

## 2015-12-22 ENCOUNTER — Encounter: Payer: Medicare Other | Admitting: Surgery

## 2015-12-22 DIAGNOSIS — L89153 Pressure ulcer of sacral region, stage 3: Secondary | ICD-10-CM | POA: Diagnosis not present

## 2015-12-22 NOTE — Progress Notes (Signed)
[name], [name] ([id number]) Visit Report for 12/22/2015 Arrival Information Details Patient Name: [name], [name] Date of Service: 12/22/2015 10:45 AM Medical Record Number: [id number] Patient Account Number: [id number] Date of Birth/Sex: 11-06-1945 (70 y.o. Male) Treating RN: Montey Hora Primary Care Physician: Fulton Reek Other Clinician: Referring Physician: Fulton Reek Treating Physician/Extender: Frann Rider in Treatment: 12 Visit Information History Since Last Visit Added or deleted any medications: No Patient Arrived: Ambulatory Any new allergies or adverse reactions: No Arrival Time: 10:50 Had a fall or experienced change in No Accompanied By: spouse activities of daily living that may affect Transfer Assistance: None risk of falls: Patient Identification Verified: Yes Signs or symptoms of abuse/neglect since last No Secondary Verification Process Yes visito Completed: Hospitalized since last visit: No Patient Requires Transmission-Based No Pain Present Now: Yes Precautions: Patient Has Alerts: No Electronic Signature(s) Signed: 12/22/2015 4:44:46 PM By: Montey Hora Entered By: Montey Hora on 12/22/2015 11:00:01 [name] ([id number]) -------------------------------------------------------------------------------- Clinic Level of Care Assessment Details Patient Name: [name], [name] Date of Service: 12/22/2015 10:45 AM Medical Record Number: [id number] Patient Account Number: [id number] Date of Birth/Sex: 1946/03/25 (70 y.o. Male) Treating RN: Montey Hora Primary Care Physician: Fulton Reek Other Clinician: Referring Physician: Fulton Reek Treating Physician/Extender: Frann Rider in Treatment: 12 Clinic Level of Care Assessment Items TOOL 4 Quantity Score []  - Use when only an EandM is performed on FOLLOW-UP visit 0 ASSESSMENTS - Nursing Assessment / Reassessment X - Reassessment of Co-morbidities  (includes updates in patient status) 1 10 X - Reassessment of Adherence to Treatment Plan 1 5 ASSESSMENTS - Wound and Skin Assessment / Reassessment X - Simple Wound Assessment / Reassessment - one wound 1 5 []  - Complex Wound Assessment / Reassessment - multiple wounds 0 []  - Dermatologic / Skin Assessment (not related to wound area) 0 ASSESSMENTS - Focused Assessment []  - Circumferential Edema Measurements - multi extremities 0 []  - Nutritional Assessment / Counseling / Intervention 0 []  - Lower Extremity Assessment (monofilament, tuning fork, pulses) 0 []  - Peripheral Arterial Disease Assessment (using hand held doppler) 0 ASSESSMENTS - Ostomy and/or Continence Assessment and Care []  - Incontinence Assessment and Management 0 []  - Ostomy Care Assessment and Management (repouching, etc.) 0 PROCESS - Coordination of Care X - Simple Patient / Family Education for ongoing care 1 15 []  - Complex (extensive) Patient / Family Education for ongoing care 0 []  - Staff obtains Programmer, systems, Records, Test Results / Process Orders 0 []  - Staff telephones HHA, Nursing Homes / Clarify orders / etc 0 []  - Routine Transfer to another Facility (non-emergent condition) 0 Pepperman, [place] ([id number]) []  - Routine Hospital Admission (non-emergent condition) 0 []  - New Admissions / Biomedical engineer / Ordering NPWT, Apligraf, etc. 0 []  - Emergency Hospital Admission (emergent condition) 0 X - Simple Discharge Coordination 1 10 []  - Complex (extensive) Discharge Coordination 0 PROCESS - Special Needs []  - Pediatric / Minor Patient Management 0 []  - Isolation Patient Management 0 []  - Hearing / Language / Visual special needs 0 []  - Assessment of Community assistance (transportation, D/C planning, etc.) 0 []  - Additional assistance / Altered mentation 0 []  - Support Surface(s) Assessment (bed, cushion, seat, etc.) 0 INTERVENTIONS - Wound Cleansing / Measurement X - Simple Wound Cleansing - one  wound 1 5 []  - Complex Wound Cleansing - multiple wounds 0 X - Wound Imaging (photographs - any number of wounds) 1 5 []  - Wound Tracing (instead of photographs) 0 X -  Simple Wound Measurement - one wound 1 5 []  - Complex Wound Measurement - multiple wounds 0 INTERVENTIONS - Wound Dressings X - Small Wound Dressing one or multiple wounds 1 10 []  - Medium Wound Dressing one or multiple wounds 0 []  - Large Wound Dressing one or multiple wounds 0 []  - Application of Medications - topical 0 []  - Application of Medications - injection 0 INTERVENTIONS - Miscellaneous []  - External ear exam 0 [place], [place] B. ([id number]) []  - Specimen Collection (cultures, biopsies, blood, body fluids, etc.) 0 []  - Specimen(s) / Culture(s) sent or taken to Lab for analysis 0 []  - Patient Transfer (multiple staff / Harrel Lemon Lift / Similar devices) 0 []  - Simple Staple / Suture removal (25 or less) 0 []  - Complex Staple / Suture removal (26 or more) 0 []  - Hypo / Hyperglycemic Management (close monitor of Blood Glucose) 0 []  - Ankle / Brachial Index (ABI) - do not check if billed separately 0 X - Vital Signs 1 5 Has the patient been seen at the hospital within the last three years: Yes Total Score: 75 Level Of Care: New/Established - Level 2 Electronic Signature(s) Signed: 12/22/2015 4:44:46 PM By: Montey Hora Entered By: Montey Hora on 12/22/2015 11:15:19 [name] ([id number]) -------------------------------------------------------------------------------- Encounter Discharge Information Details Patient Name: [name], Kinney B. Date of Service: 12/22/2015 10:45 AM Medical Record Number: [id number] Patient Account Number: [id number] Date of Birth/Sex: 01/27/1946 (70 y.o. Male) Treating RN: Montey Hora Primary Care Physician: Fulton Reek Other Clinician: Referring Physician: Fulton Reek Treating Physician/Extender: Frann Rider in Treatment: 12 Encounter Discharge  Information Items Discharge Pain Level: 0 Discharge Condition: Stable Ambulatory Status: Ambulatory Discharge Destination: Home Transportation: Private Auto Accompanied By: spouse Schedule Follow-up Appointment: Yes Medication Reconciliation completed and provided to Patient/Care No Shaquel Chavous: Provided on Clinical Summary of Care: 12/22/2015 Form Type Recipient Paper Patient PB Electronic Signature(s) Signed: 12/22/2015 11:45:35 AM By: Montey Hora Previous Signature: 12/22/2015 11:22:41 AM Version By: Ruthine Dose Entered By: Montey Hora on 12/22/2015 11:45:34 [name] ([id number]) -------------------------------------------------------------------------------- Multi Wound Chart Details Patient Name: [name], Graeden B. Date of Service: 12/22/2015 10:45 AM Medical Record Number: [id number] Patient Account Number: [id number] Date of Birth/Sex: Oct 11, 1945 (70 y.o. Male) Treating RN: Montey Hora Primary Care Physician: Fulton Reek Other Clinician: Referring Physician: Fulton Reek Treating Physician/Extender: Frann Rider in Treatment: 12 Vital Signs Height(in): 68 Pulse(bpm): 66 Weight(lbs): 175 Blood Pressure 126/60 (mmHg): Body Mass Index(BMI): 27 Temperature(F): 98.2 Respiratory Rate 16 (breaths/min): Photos: [2:No Photos] [N/A:N/A] Wound Location: [2:Sacrum] [N/A:N/A] Wounding Event: [2:Gradually Appeared] [N/A:N/A] Primary Etiology: [2:Pressure Ulcer] [N/A:N/A] Comorbid History: [2:Chronic Obstructive Pulmonary Disease (COPD), Arrhythmia, Congestive Heart Failure, Coronary Artery Disease, Hypertension, Peripheral Venous Disease, Osteoarthritis] [N/A:N/A] Date Acquired: [2:06/01/2015] [N/A:N/A] Weeks of Treatment: [2:12] [N/A:N/A] Wound Status: [2:Open] [N/A:N/A] Measurements L x W x D 0.6x0.3x0.1 [N/A:N/A] (cm) Area (cm) : [2:0.141] [N/A:N/A] Volume (cm) : [2:0.014] [N/A:N/A] % Reduction in Area: [2:99.10%] [N/A:N/A] %  Reduction in Volume: 99.10% [N/A:N/A] Classification: [2:Category/Stage III] [N/A:N/A] Exudate Amount: [2:Small] [N/A:N/A] Exudate Type: [2:Serous] [N/A:N/A] Exudate Color: [2:amber] [N/A:N/A] Wound Margin: [2:Flat and Intact] [N/A:N/A] Granulation Amount: [2:None Present (0%)] [N/A:N/A] Necrotic Amount: [2:Large (67-100%)] [N/A:N/A] Exposed Structures: [2:Fascia: No Fat: No] [N/A:N/A] Tendon: No Muscle: No Joint: No Bone: No Limited to Skin Breakdown Epithelialization: Medium (34-66%) N/A N/A Periwound Skin Texture: No Abnormalities Noted N/A N/A Periwound Skin Moist: Yes N/A N/A Moisture: Periwound Skin Color: Erythema: No N/A N/A Temperature: No Abnormality N/A N/A Tenderness on Yes N/A N/A Palpation: Wound  Preparation: Ulcer Cleansing: N/A N/A Rinsed/Irrigated with Saline Topical Anesthetic Applied: Other: lidocaine 4% Treatment Notes Electronic Signature(s) Signed: 12/22/2015 4:44:46 PM By: Montey Hora Entered By: Montey Hora on 12/22/2015 11:05:28 [name] ([id number]) -------------------------------------------------------------------------------- [place] Details Patient Name: [name], Heaven B. Date of Service: 12/22/2015 10:45 AM Medical Record Number: [id number] Patient Account Number: [id number] Date of Birth/Sex: August 11, 1945 (70 y.o. Male) Treating RN: Montey Hora Primary Care Physician: Fulton Reek Other Clinician: Referring Physician: Fulton Reek Treating Physician/Extender: Frann Rider in Treatment: 12 Active Inactive Orientation to the Wound Care Program Nursing Diagnoses: Knowledge deficit related to the wound healing center program Goals: Patient/caregiver will verbalize understanding of the [facility] Program Date Initiated: 10/20/2015 Goal Status: Active Interventions: Provide education on orientation to the wound center Notes: Pressure Nursing Diagnoses: Knowledge deficit  related to management of pressures ulcers Goals: Patient will remain free from development of additional pressure ulcers Date Initiated: 10/20/2015 Goal Status: Active Interventions: Assess: immobility, friction, shearing, incontinence upon admission and as needed Notes: Electronic Signature(s) Signed: 12/22/2015 4:44:46 PM By: Montey Hora Entered By: Montey Hora on 12/22/2015 11:05:18 [name] ([id number]) -------------------------------------------------------------------------------- Pain Assessment Details Patient Name: [name], Viviano B. Date of Service: 12/22/2015 10:45 AM Medical Record Number: [id number] Patient Account Number: [id number] Date of Birth/Sex: 20-Jul-1945 (70 y.o. Male) Treating RN: Montey Hora Primary Care Physician: Fulton Reek Other Clinician: Referring Physician: Fulton Reek Treating Physician/Extender: Frann Rider in Treatment: 12 Active Problems Location of Pain Severity and Description of Pain Patient Has Paino Yes Site Locations Pain Location: Pain in Ulcers With Dressing Change: Yes Duration of the Pain. Constant / Intermittento Constant Pain Management and Medication Current Pain Management: Notes Topical or injectable lidocaine is offered to patient for acute pain when surgical debridement is performed. If needed, Patient is instructed to use over the counter pain medication for the following 24-48 hours after debridement. Wound care MDs do not prescribed pain medications. Patient has chronic pain or uncontrolled pain. Patient has been instructed to make an appointment with their Primary Care Physician for pain management. Electronic Signature(s) Signed: 12/22/2015 4:44:46 PM By: Montey Hora Entered By: Montey Hora on 12/22/2015 11:00:22 [name] ([id number]) -------------------------------------------------------------------------------- Patient/Caregiver Education Details Patient Name:  [name], Senica B. Date of Service: 12/22/2015 10:45 AM Medical Record Number: [id number] Patient Account Number: [id number] Date of Birth/Gender: Jan 19, 1946 (70 y.o. Male) Treating RN: Montey Hora Primary Care Physician: Fulton Reek Other Clinician: Referring Physician: Fulton Reek Treating Physician/Extender: Frann Rider in Treatment: 12 Education Assessment Education Provided To: Patient and Caregiver Education Topics Provided Wound/Skin Impairment: Handouts: Other: wound care as ordered Methods: Demonstration, Explain/Verbal Responses: State content correctly Electronic Signature(s) Signed: 12/22/2015 4:44:46 PM By: Montey Hora Entered By: Montey Hora on 12/22/2015 11:45:56 [name] ([id number]) -------------------------------------------------------------------------------- Wound Assessment Details Patient Name: [name], Lukah B. Date of Service: 12/22/2015 10:45 AM Medical Record Number: [id number] Patient Account Number: [id number] Date of Birth/Sex: Nov 06, 1945 (70 y.o. Male) Treating RN: Montey Hora Primary Care Physician: Fulton Reek Other Clinician: Referring Physician: Fulton Reek Treating Physician/Extender: Frann Rider in Treatment: 12 Wound Status Wound Number: 2 Primary Pressure Ulcer Etiology: Wound Location: Sacrum Wound Open Wounding Event: Gradually Appeared Status: Date Acquired: 06/01/2015 Comorbid Chronic Obstructive Pulmonary Disease Weeks Of Treatment: 12 History: (COPD), Arrhythmia, Congestive Heart Clustered Wound: No Failure, Coronary Artery Disease, Hypertension, Peripheral Venous Disease, Osteoarthritis Photos Wound Measurements Length: (cm) 0.6 Width: (cm) 0.3 Depth: (cm) 0.1 Area: (cm) 0.141 Volume: (cm) 0.014 % Reduction in Area:  99.1% % Reduction in Volume: 99.1% Epithelialization: Medium (34-66%) Tunneling: No Undermining: No Wound Description Classification:  Category/Stage III Wound Margin: Flat and Intact Exudate Amount: Small Exudate Type: Serous Exudate Color: amber Foul Odor After Cleansing: No Wound Bed Granulation Amount: None Present (0%) Exposed Structure Necrotic Amount: Large (67-100%) Fascia Exposed: No Necrotic Quality: Adherent Slough Fat Layer Exposed: No Demps, Kal B. ([id number]) Tendon Exposed: No Muscle Exposed: No Joint Exposed: No Bone Exposed: No Limited to Skin Breakdown Periwound Skin Texture Texture Color No Abnormalities Noted: No No Abnormalities Noted: No Erythema: No Moisture No Abnormalities Noted: No Temperature / Pain Moist: Yes Temperature: No Abnormality Tenderness on Palpation: Yes Wound Preparation Ulcer Cleansing: Rinsed/Irrigated with Saline Topical Anesthetic Applied: Other: lidocaine 4%, Treatment Notes Wound #2 (Sacrum) 1. Cleansed with: Clean wound with Normal Saline 2. Anesthetic Topical Lidocaine 4% cream to wound bed prior to debridement 4. Dressing Applied: Other dressing (specify in notes) 5. Secondary Dressing Applied Bordered Foam Dressing Dry Gauze Notes sorbact with hydrogel Electronic Signature(s) Signed: 12/22/2015 4:44:46 PM By: Montey Hora Entered By: Montey Hora on 12/22/2015 11:44:38 [name] ([id number]) -------------------------------------------------------------------------------- Vitals Details Patient Name: [name], Larnie B. Date of Service: 12/22/2015 10:45 AM Medical Record Number: [id number] Patient Account Number: [id number] Date of Birth/Sex: 04/01/1946 (70 y.o. Male) Treating RN: Montey Hora Primary Care Physician: Fulton Reek Other Clinician: Referring Physician: Fulton Reek Treating Physician/Extender: Frann Rider in Treatment: 12 Vital Signs Time Taken: 11:00 Temperature (F): 98.2 Height (in): 68 Pulse (bpm): 66 Weight (lbs): 175 Respiratory Rate (breaths/min): 16 Body Mass Index (BMI):  26.6 Blood Pressure (mmHg): 126/60 Reference Range: 80 - 120 mg / dl Electronic Signature(s) Signed: 12/22/2015 4:44:46 PM By: Montey Hora Entered By: Montey Hora on 12/22/2015 11:00:48

## 2015-12-22 NOTE — Progress Notes (Signed)
[name] ([id number]) Visit Report for 12/22/2015 Chief Complaint Document Details Patient Name: [name], [name] Date of Service: 12/22/2015 10:45 AM Medical Record Number: [id number] Patient Account Number: [id number] Date of Birth/Sex: 1946-01-20 (70 y.o. Male) Treating RN: [name] Primary Care Physician: [name] Other Clinician: Referring Physician: Fulton [name] Treating Physician/Extender: [name] in Treatment: 12 Information Obtained from: Patient Chief Complaint Patient presents to the wound care center for a consult due non healing wound for about 4 months now Electronic Signature(s) Signed: 12/22/2015 11:26:32 AM By: [name] Fudge MD, FACS Entered By: [name] on 12/22/2015 11:26:32 [name] ([id number]) -------------------------------------------------------------------------------- HPI Details Patient Name: [name], [name] Date of Service: 12/22/2015 10:45 AM Medical Record Number: [id number] Patient Account Number: [id number] Date of Birth/Sex: 1946/02/06 (70 y.o. Male) Treating RN: [name] Primary Care Physician: [name] Other Clinician: Referring Physician: Fulton [name] Treating Physician/Extender: [name] in Treatment: 12 History of Present Illness Location: ulcerated areas in the region of the sacrum bilaterally Quality: Patient reports experiencing a shooting pain to affected area(s). Severity: Patient states wound are getting worse. Duration: Patient has had the wound for > 3 months prior to seeking treatment at the wound center Timing: Pain in wound is constant (hurts all the time) Context: The wound would happen gradually Modifying Factors: Other treatment(s) tried include:has been treated by his PCP and has already been on chronic pain medications Associated Signs and Symptoms: Patient reports having difficulty standing for long periods. HPI Description: 70 year old  gentleman who was referred to [place] by his PCP Dr. Fulton [name] for decubitus ulcers in the region of the sacrum which are very painful and these had them for about 4 months now. The patient's past medical history includes aortic stenosis, atrial fibrillation, coronary artery disease, CHF, COPD, coronary artery disease, prediabetes, peripheral vascular disease, nicotine addiction. In the past he's had carotid artery angioplasty and coronary artery bypass graft and VSD repair. He now has a chronic hip condition which is causing him a lot of pain and he goes to a pain clinic for a long while for this. He's been a smoker for over 40 years and smokes about a pack of cigarettes a day. His last hemoglobin A1c done in February of this year was 5.7. 10/06/2015 -- he continues to have a lot of pain around his decubitus ulcers and other than that has been trying his best to offload. He is also working on his smoking cessation. Electronic Signature(s) Signed: 12/22/2015 11:26:38 AM By: [name] Fudge MD, FACS Entered By: [name] on 12/22/2015 11:26:38 [name] ([id number]) -------------------------------------------------------------------------------- Physical Exam Details Patient Name: [name], [name] Date of Service: 12/22/2015 10:45 AM Medical Record Number: [id number] Patient Account Number: [id number] Date of Birth/Sex: April 26, 1946 (70 y.o. Male) Treating RN: [name] Primary Care Physician: [name] Other Clinician: Referring Physician: Fulton [name] Treating Physician/Extender: [name] in Treatment: 12 Constitutional . Pulse regular. Respirations normal and unlabored. Afebrile. . Eyes Nonicteric. Reactive to light. Ears, Nose, Mouth, and Throat Lips, teeth, and gums WNL.Marland Kitchen Moist mucosa without lesions. Neck supple and nontender. No palpable supraclavicular or cervical adenopathy. Normal sized without goiter. Respiratory WNL. No retractions..  Breath sounds WNL, No rubs, rales, rhonchi, or wheeze.. Cardiovascular Heart rhythm and rate regular, no murmur or gallop.. Pedal Pulses WNL. No clubbing, cyanosis or edema. Lymphatic No adneopathy. No adenopathy. No adenopathy. Musculoskeletal Adexa without tenderness or enlargement.. Digits and nails w/o clubbing, cyanosis, infection, petechiae, ischemia, or inflammatory conditions.. Integumentary (  Hair, Skin) No suspicious lesions. No crepitus or fluctuance. No peri-wound warmth or erythema. No masses.Marland Kitchen Psychiatric Judgement and insight Intact.. No evidence of depression, anxiety, or agitation.. Notes no sharp debridement was required today and the wound looks good with healthy granulation tissue Electronic Signature(s) Signed: 12/22/2015 11:27:04 AM By: [name] Fudge MD, FACS Entered By: [name] on 12/22/2015 11:27:04 [name] ([id number]) -------------------------------------------------------------------------------- Physician Orders Details Patient Name: [name], [name] Date of Service: 12/22/2015 10:45 AM Medical Record Number: [id number] Patient Account Number: [id number] Date of Birth/Sex: 1945/11/17 (70 y.o. Male) Treating RN: [name] Primary Care Physician: [name] Other Clinician: Referring Physician: Fulton [name] Treating Physician/Extender: [name] in Treatment: 12 Verbal / Phone Orders: Yes Clinician: Montey [name] Read Back and Verified: Yes Diagnosis Coding Wound Cleansing Wound #2 Sacrum o Clean wound with Normal Saline. o Cleanse wound with mild soap and water o May Shower, gently pat wound dry prior to applying new dressing. Skin Barriers/Peri-Wound Care Wound #2 Sacrum o Skin Prep Primary Wound Dressing Wound #2 Sacrum o Other: - sorbact with hydrogel Secondary Dressing Wound #2 Sacrum o Dry Gauze o Boardered Foam Dressing Dressing Change Frequency Wound #2 Sacrum o Change dressing  every other day. Follow-up Appointments Wound #2 Sacrum o Return Appointment in 1 week. Off-Loading Wound #2 Sacrum o Turn and reposition every 2 hours Additional Orders / Instructions Wound #2 Sacrum o Stop Smoking o Increase protein intake. UTAH, [name] ([id number]) Medications-please add to medication list. Wound #2 Sacrum o Other: - Vitamin C, Zinc, Multivitamins Electronic Signature(s) Signed: 12/22/2015 3:52:04 PM By: [name] Fudge MD, FACS Signed: 12/22/2015 4:44:46 PM By: [name] Entered By: [name] on 12/22/2015 11:14:52 [name] ([id number]) -------------------------------------------------------------------------------- Problem List Details Patient Name: [name], [name] Date of Service: 12/22/2015 10:45 AM Medical Record Number: [id number] Patient Account Number: [id number] Date of Birth/Sex: July 27, 1945 (70 y.o. Male) Treating RN: [name] Primary Care Physician: [name] Other Clinician: Referring Physician: Fulton [name] Treating Physician/Extender: [name] in Treatment: 12 Active Problems ICD-10 Encounter Code Description Active Date Diagnosis L89.153 Pressure ulcer of sacral region, stage 3 09/29/2015 Yes F17.218 Nicotine dependence, cigarettes, with other nicotine- 09/29/2015 Yes induced disorders G89.4 Chronic pain syndrome 09/29/2015 Yes Inactive Problems Resolved Problems ICD-10 Code Description Active Date Resolved Date L89.313 Pressure ulcer of right buttock, stage 3 10/20/2015 10/20/2015 Electronic Signature(s) Signed: 12/22/2015 11:26:23 AM By: [name] Fudge MD, FACS Entered By: [name] on 12/22/2015 11:26:23 [name] ([id number]) -------------------------------------------------------------------------------- Progress Note Details Patient Name: [name], [name] Date of Service: 12/22/2015 10:45 AM Medical Record Number: [id number] Patient Account Number:  [id number] Date of Birth/Sex: 17-Dec-1945 (70 y.o. Male) Treating RN: [name] Primary Care Physician: [name] Other Clinician: Referring Physician: Fulton [name] Treating Physician/Extender: [name] in Treatment: 12 Subjective Chief Complaint Information obtained from Patient Patient presents to the wound care center for a consult due non healing wound for about 4 months now History of Present Illness (HPI) The following HPI elements were documented for the patient's wound: Location: ulcerated areas in the region of the sacrum bilaterally Quality: Patient reports experiencing a shooting pain to affected area(s). Severity: Patient states wound are getting worse. Duration: Patient has had the wound for > 3 months prior to seeking treatment at the wound center Timing: Pain in wound is constant (hurts all the time) Context: The wound would happen gradually Modifying Factors: Other treatment(s) tried include:has been treated by his PCP and has already been on chronic  pain medications Associated Signs and Symptoms: Patient reports having difficulty standing for long periods. 70 year old gentleman who was referred to [place] by his PCP Dr. Fulton [name] for decubitus ulcers in the region of the sacrum which are very painful and these had them for about 4 months now. The patient's past medical history includes aortic stenosis, atrial fibrillation, coronary artery disease, CHF, COPD, coronary artery disease, prediabetes, peripheral vascular disease, nicotine addiction. In the past he's had carotid artery angioplasty and coronary artery bypass graft and VSD repair. He now has a chronic hip condition which is causing him a lot of pain and he goes to a pain clinic for a long while for this. He's been a smoker for over 40 years and smokes about a pack of cigarettes a day. His last hemoglobin A1c done in February of this year was 5.7. 10/06/2015 -- he continues to have a lot  of pain around his decubitus ulcers and other than that has been trying his best to offload. He is also working on his smoking cessation. Objective Constitutional Pulse regular. Respirations normal and unlabored. Afebrile. Vitals Time Taken: 11:00 AM, Height: 68 in, Weight: 175 lbs, BMI: 26.6, Temperature: 98.2 F, Pulse: 66 [name], [name] ([id number]) bpm, Respiratory Rate: 16 breaths/min, Blood Pressure: 126/60 mmHg. Eyes Nonicteric. Reactive to light. Ears, Nose, Mouth, and Throat Lips, teeth, and gums WNL.Marland Kitchen Moist mucosa without lesions. Neck supple and nontender. No palpable supraclavicular or cervical adenopathy. Normal sized without goiter. Respiratory WNL. No retractions.. Breath sounds WNL, No rubs, rales, rhonchi, or wheeze.. Cardiovascular Heart rhythm and rate regular, no murmur or gallop.. Pedal Pulses WNL. No clubbing, cyanosis or edema. Lymphatic No adneopathy. No adenopathy. No adenopathy. Musculoskeletal Adexa without tenderness or enlargement.. Digits and nails w/o clubbing, cyanosis, infection, petechiae, ischemia, or inflammatory conditions.Marland Kitchen Psychiatric Judgement and insight Intact.. No evidence of depression, anxiety, or agitation.. General Notes: no sharp debridement was required today and the wound looks good with healthy granulation tissue Integumentary (Hair, Skin) No suspicious lesions. No crepitus or fluctuance. No peri-wound warmth or erythema. No masses.. Wound #2 status is Open. Original cause of wound was Gradually Appeared. The wound is located on the Sacrum. The wound measures 0.6cm length x 0.3cm width x 0.1cm depth; 0.141cm^2 area and 0.014cm^3 volume. The wound is limited to skin breakdown. There is no tunneling or undermining noted. There is a small amount of serous drainage noted. The wound margin is flat and intact. There is no granulation within the wound bed. There is a large (67-100%) amount of necrotic tissue within the wound bed  including Adherent Slough. The periwound skin appearance exhibited: Moist. The periwound skin appearance did not exhibit: Erythema. Periwound temperature was noted as No Abnormality. The periwound has tenderness on palpation. Assessment [name], [name] ([id number]) Active Problems ICD-10 L89.153 - Pressure ulcer of sacral region, stage 3 F17.218 - Nicotine dependence, cigarettes, with other nicotine-induced disorders G89.4 - Chronic pain syndrome Plan Wound Cleansing: Wound #2 Sacrum: Clean wound with Normal Saline. Cleanse wound with mild soap and water May Shower, gently pat wound dry prior to applying new dressing. Skin Barriers/Peri-Wound Care: Wound #2 Sacrum: Skin Prep Primary Wound Dressing: Wound #2 Sacrum: Other: - sorbact with hydrogel Secondary Dressing: Wound #2 Sacrum: Dry Gauze Boardered Foam Dressing Dressing Change Frequency: Wound #2 Sacrum: Change dressing every other day. Follow-up Appointments: Wound #2 Sacrum: Return Appointment in 1 week. Off-Loading: Wound #2 Sacrum: Turn and reposition every 2 hours Additional Orders / Instructions: Wound #2 Sacrum: Stop  Smoking Increase protein intake. Medications-please add to medication list.: Wound #2 Sacrum: Other: - Vitamin C, Zinc, Multivitamins [name], [name] ([id number]) I have recommended Sorbact with hydrogel to be applied with a bordered foam and she can change these dressings for him every other day. Because of the holiday they will come back and see me Monday after next. Electronic Signature(s) Signed: 12/22/2015 3:54:22 PM By: [name] Fudge MD, FACS Previous Signature: 12/22/2015 3:54:11 PM Version By: [name] Fudge MD, FACS Previous Signature: 12/22/2015 11:28:15 AM Version By: [name] Fudge MD, FACS Entered By: [name] on 12/22/2015 15:54:22 [name] ([id number]) -------------------------------------------------------------------------------- SuperBill Details Patient  Name: [name], [name] Date of Service: 12/22/2015 Medical Record Number: [id number] Patient Account Number: [id number] Date of Birth/Sex: 1945-07-13 (70 y.o. Male) Treating RN: [name] Primary Care Physician: [name] Other Clinician: Referring Physician: Fulton [name] Treating Physician/Extender: [name] in Treatment: 12 Diagnosis Coding ICD-10 Codes Code Description [phone] Pressure ulcer of sacral region, stage 3 F17.218 Nicotine dependence, cigarettes, with other nicotine-induced disorders G89.4 Chronic pain syndrome Facility Procedures CPT4 Code: [id number] Description: [id number] - WOUND CARE VISIT-LEV 2 EST PT Modifier: Quantity: 1 Physician Procedures CPT4 Code Description: [id number] - WC PHYS LEVEL 3 - EST PT ICD-10 Description Diagnosis L89.153 Pressure ulcer of sacral region, stage 3 F17.218 Nicotine dependence, cigarettes, with other nicoti G89.4 Chronic pain syndrome Modifier: ne-induced dis Quantity: 1 orders Electronic Signature(s) Signed: 12/22/2015 11:28:28 AM By: [name] Fudge MD, FACS Entered By: [name] on 12/22/2015 11:28:27

## 2016-01-05 ENCOUNTER — Ambulatory Visit: Payer: Medicare Other | Admitting: Surgery

## 2016-01-12 ENCOUNTER — Encounter: Payer: Medicare Other | Attending: Surgery | Admitting: Surgery

## 2016-01-12 DIAGNOSIS — I2510 Atherosclerotic heart disease of native coronary artery without angina pectoris: Secondary | ICD-10-CM | POA: Insufficient documentation

## 2016-01-12 DIAGNOSIS — G894 Chronic pain syndrome: Secondary | ICD-10-CM | POA: Insufficient documentation

## 2016-01-12 DIAGNOSIS — I350 Nonrheumatic aortic (valve) stenosis: Secondary | ICD-10-CM | POA: Diagnosis not present

## 2016-01-12 DIAGNOSIS — F17218 Nicotine dependence, cigarettes, with other nicotine-induced disorders: Secondary | ICD-10-CM | POA: Diagnosis not present

## 2016-01-12 DIAGNOSIS — L89153 Pressure ulcer of sacral region, stage 3: Secondary | ICD-10-CM | POA: Diagnosis present

## 2016-01-12 DIAGNOSIS — I509 Heart failure, unspecified: Secondary | ICD-10-CM | POA: Insufficient documentation

## 2016-01-12 DIAGNOSIS — J449 Chronic obstructive pulmonary disease, unspecified: Secondary | ICD-10-CM | POA: Insufficient documentation

## 2016-01-12 DIAGNOSIS — R7303 Prediabetes: Secondary | ICD-10-CM | POA: Diagnosis not present

## 2016-01-12 DIAGNOSIS — I739 Peripheral vascular disease, unspecified: Secondary | ICD-10-CM | POA: Diagnosis not present

## 2016-01-12 DIAGNOSIS — I4891 Unspecified atrial fibrillation: Secondary | ICD-10-CM | POA: Insufficient documentation

## 2016-01-12 NOTE — Progress Notes (Signed)
KAYNIN, BRAZILL ([id number]) Visit Report for 01/12/2016 Chief Complaint Document Details Patient Name: [name], [name] Date of Service: 01/12/2016 12:45 PM Medical Record Number: [id number] Patient Account Number: [id number] Date of Birth/Sex: [birth year] (70 y.o. Male) Treating RN: Montey Hora Primary Care Physician: Fulton Reek Other Clinician: Referring Physician: Fulton Reek Treating Physician/Extender: Frann Rider in Treatment: 15 Information Obtained from: Patient Chief Complaint Patient presents to the wound care center for a consult due non healing wound for about 4 months now Electronic Signature(s) Signed: 01/12/2016 1:16:54 PM By: Christin Fudge MD, FACS Entered By: Christin Fudge on 01/12/2016 13:16:54 [name] ([id number]) -------------------------------------------------------------------------------- HPI Details Patient Name: [name], [name] Date of Service: 01/12/2016 12:45 PM Medical Record Number: [id number] Patient Account Number: [id number] Date of Birth/Sex: 1946/02/26 (70 y.o. Male) Treating RN: Montey Hora Primary Care Physician: Fulton Reek Other Clinician: Referring Physician: Fulton Reek Treating Physician/Extender: Frann Rider in Treatment: 15 History of Present Illness Location: ulcerated areas in the region of the sacrum bilaterally Quality: Patient reports experiencing a shooting pain to affected area(s). Severity: Patient states wound are getting worse. Duration: Patient has had the wound for > 3 months prior to seeking treatment at the wound center Timing: Pain in wound is constant (hurts all the time) Context: The wound would happen gradually Modifying Factors: Other treatment(s) tried include:has been treated by his PCP and has already been on chronic pain medications Associated Signs and Symptoms: Patient reports having difficulty standing for long periods. HPI Description: 70 year old  gentleman who was referred to [place] by his PCP Dr. Fulton Reek for decubitus ulcers in the region of the sacrum which are very painful and these had them for about 4 months now. The patient's past medical history includes aortic stenosis, atrial fibrillation, coronary artery disease, CHF, COPD, coronary artery disease, prediabetes, peripheral vascular disease, nicotine addiction. In the past he's had carotid artery angioplasty and coronary artery bypass graft and VSD repair. He now has a chronic hip condition which is causing him a lot of pain and he goes to a pain clinic for a long while for this. He's been a smoker for over 40 years and smokes about a pack of cigarettes a day. His last hemoglobin A1c done in February of this year was 5.7. 10/06/2015 -- he continues to have a lot of pain around his decubitus ulcers and other than that has been trying his best to offload. He is also working on his smoking cessation. 01/12/2016 -- he has not been here for 3 weeks due to other issues and has been doing his dressing as advised. Electronic Signature(s) Signed: 01/12/2016 1:18:44 PM By: Christin Fudge MD, FACS Entered By: Christin Fudge on 01/12/2016 13:18:44 [name] ([id number]) -------------------------------------------------------------------------------- Physical Exam Details Patient Name: [name], [name] Date of Service: 01/12/2016 12:45 PM Medical Record Number: [id number] Patient Account Number: [id number] Date of Birth/Sex: March 27, 1946 (70 y.o. Male) Treating RN: Montey Hora Primary Care Physician: Fulton Reek Other Clinician: Referring Physician: Fulton Reek Treating Physician/Extender: Frann Rider in Treatment: 15 Constitutional . Pulse regular. Respirations normal and unlabored. Afebrile. . Eyes Nonicteric. Reactive to light. Ears, Nose, Mouth, and Throat Lips, teeth, and gums WNL.Marland Kitchen Moist mucosa without lesions. Neck supple and nontender. No  palpable supraclavicular or cervical adenopathy. Normal sized without goiter. Respiratory WNL. No retractions.. Cardiovascular Pedal Pulses WNL. No clubbing, cyanosis or edema. Lymphatic No adneopathy. No adenopathy. No adenopathy. Musculoskeletal Adexa without tenderness or enlargement.. Digits and nails w/o clubbing, cyanosis, infection, petechiae, ischemia,  or inflammatory conditions.. Integumentary (Hair, Skin) No suspicious lesions. No crepitus or fluctuance. No peri-wound warmth or erythema. No masses.Marland Kitchen Psychiatric Judgement and insight Intact.. No evidence of depression, anxiety, or agitation.. Notes the area continues to have minimal slough but the surrounding skin is looking good and overall it has improved. Electronic Signature(s) Signed: 01/12/2016 1:19:48 PM By: Christin Fudge MD, FACS Entered By: Christin Fudge on 01/12/2016 13:19:47 [name] ([id number]) -------------------------------------------------------------------------------- Physician Orders Details Patient Name: [name], [name] Date of Service: 01/12/2016 12:45 PM Medical Record Number: [id number] Patient Account Number: [id number] Date of Birth/Sex: Jan 10, 1946 (70 y.o. Male) Treating RN: Ahmed Prima Primary Care Physician: Fulton Reek Other Clinician: Referring Physician: Fulton Reek Treating Physician/Extender: Frann Rider in Treatment: 15 Verbal / Phone Orders: Yes Clinician: Carolyne Fiscal, Debi Read Back and Verified: Yes Diagnosis Coding Wound Cleansing Wound #2 Sacrum o Clean wound with Normal Saline. o Cleanse wound with mild soap and water o May Shower, gently pat wound dry prior to applying new dressing. Skin Barriers/Peri-Wound Care Wound #2 Sacrum o Skin Prep Primary Wound Dressing Wound #2 Sacrum o Medihoney gel Secondary Dressing Wound #2 Sacrum o Dry Gauze o Boardered Foam Dressing Dressing Change Frequency Wound #2 Sacrum o Change  dressing every other day. Follow-up Appointments Wound #2 Sacrum o Return Appointment in 1 week. Off-Loading Wound #2 Sacrum o Turn and reposition every 2 hours Additional Orders / Instructions Wound #2 Sacrum o Stop Smoking o Increase protein intake. STEDMAN, TORI ([id number]) Medications-please add to medication list. Wound #2 Sacrum o Other: - Vitamin C, Zinc, Multivitamins Patient Medications Allergies: Celebrex, penicillin Notifications Medication Indication Start End MediHoney (honey) 01/12/2016 DOSE topical 100 % paste - paste topical as directed Electronic Signature(s) Signed: 01/12/2016 1:07:57 PM By: Christin Fudge MD, FACS Entered By: Christin Fudge on 01/12/2016 13:07:57 [name] ([id number]) -------------------------------------------------------------------------------- Problem List Details Patient Name: [name], Lem B. Date of Service: 01/12/2016 12:45 PM Medical Record Number: [id number] Patient Account Number: [id number] Date of Birth/Sex: 07-21-1945 (70 y.o. Male) Treating RN: Montey Hora Primary Care Physician: Fulton Reek Other Clinician: Referring Physician: Fulton Reek Treating Physician/Extender: Frann Rider in Treatment: 15 Active Problems ICD-10 Encounter Code Description Active Date Diagnosis L89.153 Pressure ulcer of sacral region, stage 3 09/29/2015 Yes F17.218 Nicotine dependence, cigarettes, with other nicotine- 09/29/2015 Yes induced disorders G89.4 Chronic pain syndrome 09/29/2015 Yes Inactive Problems Resolved Problems ICD-10 Code Description Active Date Resolved Date L89.313 Pressure ulcer of right buttock, stage 3 10/20/2015 10/20/2015 Electronic Signature(s) Signed: 01/12/2016 1:16:43 PM By: Christin Fudge MD, FACS Entered By: Christin Fudge on 01/12/2016 13:16:42 [name] ([id number]) -------------------------------------------------------------------------------- Progress Note  Details Patient Name: [name], Loudon B. Date of Service: 01/12/2016 12:45 PM Medical Record Number: [id number] Patient Account Number: [id number] Date of Birth/Sex: November 02, 1945 (70 y.o. Male) Treating RN: Montey Hora Primary Care Physician: Fulton Reek Other Clinician: Referring Physician: Fulton Reek Treating Physician/Extender: Frann Rider in Treatment: 15 Subjective Chief Complaint Information obtained from Patient Patient presents to the wound care center for a consult due non healing wound for about 4 months now History of Present Illness (HPI) The following HPI elements were documented for the patient's wound: Location: ulcerated areas in the region of the sacrum bilaterally Quality: Patient reports experiencing a shooting pain to affected area(s). Severity: Patient states wound are getting worse. Duration: Patient has had the wound for > 3 months prior to seeking treatment at the wound center Timing: Pain in wound is constant (hurts all the time) Context: The  wound would happen gradually Modifying Factors: Other treatment(s) tried include:has been treated by his PCP and has already been on chronic pain medications Associated Signs and Symptoms: Patient reports having difficulty standing for long periods. 70 year old gentleman who was referred to [place] by his PCP Dr. Fulton Reek for decubitus ulcers in the region of the sacrum which are very painful and these had them for about 4 months now. The patient's past medical history includes aortic stenosis, atrial fibrillation, coronary artery disease, CHF, COPD, coronary artery disease, prediabetes, peripheral vascular disease, nicotine addiction. In the past he's had carotid artery angioplasty and coronary artery bypass graft and VSD repair. He now has a chronic hip condition which is causing him a lot of pain and he goes to a pain clinic for a long while for this. He's been a smoker for over 40 years and smokes  about a pack of cigarettes a day. His last hemoglobin A1c done in February of this year was 5.7. 10/06/2015 -- he continues to have a lot of pain around his decubitus ulcers and other than that has been trying his best to offload. He is also working on his smoking cessation. 01/12/2016 -- he has not been here for 3 weeks due to other issues and has been doing his dressing as advised. Objective Constitutional Pulse regular. Respirations normal and unlabored. Afebrile. LUCY, Jemaine B. ([id number]) Vitals Time Taken: 12:50 PM, Height: 68 in, Weight: 175 lbs, BMI: 26.6, Temperature: 98.4 F, Pulse: 78 bpm, Respiratory Rate: 18 breaths/min, Blood Pressure: 117/87 mmHg. Eyes Nonicteric. Reactive to light. Ears, Nose, Mouth, and Throat Lips, teeth, and gums WNL.Marland Kitchen Moist mucosa without lesions. Neck supple and nontender. No palpable supraclavicular or cervical adenopathy. Normal sized without goiter. Respiratory WNL. No retractions.. Cardiovascular Pedal Pulses WNL. No clubbing, cyanosis or edema. Lymphatic No adneopathy. No adenopathy. No adenopathy. Musculoskeletal Adexa without tenderness or enlargement.. Digits and nails w/o clubbing, cyanosis, infection, petechiae, ischemia, or inflammatory conditions.Marland Kitchen Psychiatric Judgement and insight Intact.. No evidence of depression, anxiety, or agitation.. General Notes: the area continues to have minimal slough but the surrounding skin is looking good and overall it has improved. Integumentary (Hair, Skin) No suspicious lesions. No crepitus or fluctuance. No peri-wound warmth or erythema. No masses.. Wound #2 status is Open. Original cause of wound was Gradually Appeared. The wound is located on the Sacrum. The wound measures 0.5cm length x 0.3cm width x 0.1cm depth; 0.118cm^2 area and 0.012cm^3 volume. The wound is limited to skin breakdown. There is no tunneling or undermining noted. There is a small amount of serous drainage noted. The  wound margin is flat and intact. There is no granulation within the wound bed. There is a large (67-100%) amount of necrotic tissue within the wound bed including Adherent Slough. The periwound skin appearance exhibited: Moist. The periwound skin appearance did not exhibit: Erythema. Periwound temperature was noted as No Abnormality. The periwound has tenderness on palpation. RAY, TOYAMA ([id number]) Assessment Active Problems ICD-10 L89.153 - Pressure ulcer of sacral region, stage 3 F17.218 - Nicotine dependence, cigarettes, with other nicotine-induced disorders G89.4 - Chronic pain syndrome Plan Wound Cleansing: Wound #2 Sacrum: Clean wound with Normal Saline. Cleanse wound with mild soap and water May Shower, gently pat wound dry prior to applying new dressing. Skin Barriers/Peri-Wound Care: Wound #2 Sacrum: Skin Prep Primary Wound Dressing: Wound #2 Sacrum: Medihoney gel Secondary Dressing: Wound #2 Sacrum: Dry Gauze Boardered Foam Dressing Dressing Change Frequency: Wound #2 Sacrum: Change dressing every other day. Follow-up Appointments:  Wound #2 Sacrum: Return Appointment in 1 week. Off-Loading: Wound #2 Sacrum: Turn and reposition every 2 hours Additional Orders / Instructions: Wound #2 Sacrum: Stop Smoking Increase protein intake. Medications-please add to medication list.: Wound #2 Sacrum: Other: - Vitamin C, Zinc, Multivitamins The following medication(s) was prescribed: MediHoney (honey) topical 100 % paste paste topical as directed starting 01/12/2016 Kettles, Izek B. ([id number]) I have recommended Medihoney to be applied with a bordered foam and she can change these dressings for him every day. he will come to see me next week. Electronic Signature(s) Signed: 01/12/2016 1:21:16 PM By: Christin Fudge MD, FACS Entered By: Christin Fudge on 01/12/2016 13:21:16 [name] -------------------------------------------------------------------------------- SuperBill Details Patient Name: [name], Jermayne B. Date of Service: 01/12/2016 Medical Record Number: [id number] Patient Account Number: [id number] Date of Birth/Sex: 04-21-1946 (70 y.o. Male) Treating RN: Montey Hora Primary Care Physician: Fulton Reek Other Clinician: Referring Physician: Fulton Reek Treating Physician/Extender: Frann Rider in Treatment: 15 Diagnosis Coding ICD-10 Codes Code Description [phone] Pressure ulcer of sacral region, stage 3 F17.218 Nicotine dependence, cigarettes, with other nicotine-induced disorders G89.4 Chronic pain syndrome Facility Procedures CPT4 Code: [id number] Description: 99213 - WOUND CARE VISIT-LEV 3 EST PT Modifier: Quantity: 1 Physician Procedures CPT4 Code Description: [id number] - WC PHYS LEVEL 3 - EST PT ICD-10 Description Diagnosis L89.153 Pressure ulcer of sacral region, stage 3 F17.218 Nicotine dependence, cigarettes, with other nicoti G89.4 Chronic pain syndrome Modifier: ne-induced diso Quantity: 1 rders Electronic Signature(s) Unsigned Previous Signature: 01/12/2016 1:21:39 PM Version By: Christin Fudge MD, FACS Entered By: Alric Quan on 01/12/2016 13:55:49 Signature(s): Date(s):

## 2016-01-13 NOTE — Progress Notes (Signed)
BURCHELL, GONYA ([id number]) Visit Report for 01/12/2016 Arrival Information Details Patient Name: [name] [name] B. Date of Service: 01/12/2016 12:45 PM Medical Record Number: [id number] Patient Account Number: [id number] Date of Birth/Sex: 13-May-1945 (71 y.o. Male) Treating RN: Ahmed Prima Primary Care Physician: Fulton Reek Other Clinician: Referring Physician: Fulton Reek Treating Physician/Extender: Frann Rider in Treatment: 15 Visit Information History Since Last Visit All ordered tests and consults were completed: No Patient Arrived: Ambulatory Added or deleted any medications: No Arrival Time: 12:49 Any new allergies or adverse reactions: No Accompanied By: wife Had a fall or experienced change in No Transfer Assistance: None activities of daily living that may affect Patient Identification Verified: Yes risk of falls: Secondary Verification Process Yes Signs or symptoms of abuse/neglect since last No Completed: visito Patient Requires Transmission-Based No Hospitalized since last visit: No Precautions: Pain Present Now: No Patient Has Alerts: No Electronic Signature(s) Signed: 01/12/2016 4:53:07 PM By: Alric Quan Entered By: Alric Quan on 01/12/2016 12:50:04 [name] [name] ([id number]) -------------------------------------------------------------------------------- Clinic Level of Care Assessment Details Patient Name: [name] [name], [name] Date of Service: 01/12/2016 12:45 PM Medical Record Number: [id number] Patient Account Number: [id number] Date of Birth/Sex: 10-24-1945 (69 y.o. Male) Treating RN: Ahmed Prima Primary Care Physician: Fulton Reek Other Clinician: Referring Physician: Fulton Reek Treating Physician/Extender: Frann Rider in Treatment: 15 Clinic Level of Care Assessment Items TOOL 4 Quantity Score X - Use when only an EandM is performed on FOLLOW-UP visit 1 0 ASSESSMENTS - Nursing  Assessment / Reassessment X - Reassessment of Co-morbidities (includes updates in patient status) 1 10 X - Reassessment of Adherence to Treatment Plan 1 5 ASSESSMENTS - Wound and Skin Assessment / Reassessment X - Simple Wound Assessment / Reassessment - one wound 1 5 []  - Complex Wound Assessment / Reassessment - multiple wounds 0 []  - Dermatologic / Skin Assessment (not related to wound area) 0 ASSESSMENTS - Focused Assessment []  - Circumferential Edema Measurements - multi extremities 0 []  - Nutritional Assessment / Counseling / Intervention 0 []  - Lower Extremity Assessment (monofilament, tuning fork, pulses) 0 []  - Peripheral Arterial Disease Assessment (using hand held doppler) 0 ASSESSMENTS - Ostomy and/or Continence Assessment and Care []  - Incontinence Assessment and Management 0 []  - Ostomy Care Assessment and Management (repouching, etc.) 0 PROCESS - Coordination of Care X - Simple Patient / Family Education for ongoing care 1 15 []  - Complex (extensive) Patient / Family Education for ongoing care 0 []  - Staff obtains Programmer, systems, Records, Test Results / Process Orders 0 []  - Staff telephones HHA, Nursing Homes / Clarify orders / etc 0 []  - Routine Transfer to another Facility (non-emergent condition) 0 [name] [name] ([id number]) []  - Routine Hospital Admission (non-emergent condition) 0 []  - New Admissions / Biomedical engineer / Ordering NPWT, Apligraf, etc. 0 []  - Emergency Hospital Admission (emergent condition) 0 X - Simple Discharge Coordination 1 10 []  - Complex (extensive) Discharge Coordination 0 PROCESS - Special Needs []  - Pediatric / Minor Patient Management 0 []  - Isolation Patient Management 0 []  - Hearing / Language / Visual special needs 0 []  - Assessment of Community assistance (transportation, D/C planning, etc.) 0 []  - Additional assistance / Altered mentation 0 []  - Support Surface(s) Assessment (bed, cushion, seat, etc.) 0 INTERVENTIONS - Wound  Cleansing / Measurement X - Simple Wound Cleansing - one wound 1 5 []  - Complex Wound Cleansing - multiple wounds 0 X - Wound Imaging (photographs - any number of wounds) 1 5 []  -  Wound Tracing (instead of photographs) 0 X - Simple Wound Measurement - one wound 1 5 []  - Complex Wound Measurement - multiple wounds 0 INTERVENTIONS - Wound Dressings X - Small Wound Dressing one or multiple wounds 1 10 []  - Medium Wound Dressing one or multiple wounds 0 []  - Large Wound Dressing one or multiple wounds 0 X - Application of Medications - topical 1 5 []  - Application of Medications - injection 0 INTERVENTIONS - Miscellaneous []  - External ear exam 0 [name] [name] B. ([id number]) []  - Specimen Collection (cultures, biopsies, blood, body fluids, etc.) 0 []  - Specimen(s) / Culture(s) sent or taken to Lab for analysis 0 []  - Patient Transfer (multiple staff / Harrel Lemon Lift / Similar devices) 0 []  - Simple Staple / Suture removal (25 or less) 0 []  - Complex Staple / Suture removal (26 or more) 0 []  - Hypo / Hyperglycemic Management (close monitor of Blood Glucose) 0 []  - Ankle / Brachial Index (ABI) - do not check if billed separately 0 X - Vital Signs 1 5 Has the patient been seen at the hospital within the last three years: Yes Total Score: 80 Level Of Care: New/Established - Level 3 Electronic Signature(s) Signed: 01/12/2016 4:53:07 PM By: Alric Quan Entered By: Alric Quan on 01/12/2016 13:55:36 [name] [name] ([id number]) -------------------------------------------------------------------------------- Encounter Discharge Information Details Patient Name: [name] [name] [name] B. Date of Service: 01/12/2016 12:45 PM Medical Record Number: [id number] Patient Account Number: [id number] Date of Birth/Sex: [birth year] (70 y.o. Male) Treating RN: Ahmed Prima Primary Care Physician: Fulton Reek Other Clinician: Referring Physician: Fulton Reek Treating Physician/Extender:  Frann Rider in Treatment: 15 Encounter Discharge Information Items Discharge Pain Level: 0 Discharge Condition: Stable Ambulatory Status: Ambulatory Discharge Destination: Home Transportation: Private Auto Accompanied By: wife Schedule Follow-up Appointment: Yes Medication Reconciliation completed Yes and provided to Patient/Care Lilliauna Van: Patient Clinical Summary of Care: Declined Electronic Signature(s) Signed: 01/12/2016 1:22:35 PM By: Ruthine Dose Entered By: Ruthine Dose on 01/12/2016 13:22:35 [name] [name] ([id number]) -------------------------------------------------------------------------------- Lower Extremity Assessment Details Patient Name: [name] [name] [name] B. Date of Service: 01/12/2016 12:45 PM Medical Record Number: [id number] Patient Account Number: [id number] Date of Birth/Sex: 03-Feb-1946 (70 y.o. Male) Treating RN: Ahmed Prima Primary Care Physician: Fulton Reek Other Clinician: Referring Physician: Fulton Reek Treating Physician/Extender: Frann Rider in Treatment: 15 Electronic Signature(s) Signed: 01/12/2016 4:53:07 PM By: Alric Quan Entered By: Alric Quan on 01/12/2016 12:54:14 [name] [name] ([id number]) -------------------------------------------------------------------------------- Multi Wound Chart Details Patient Name: [name] [name] [name] B. Date of Service: 01/12/2016 12:45 PM Medical Record Number: [id number] Patient Account Number: [id number] Date of Birth/Sex: [birth year] (70 y.o. Male) Treating RN: Ahmed Prima Primary Care Physician: Fulton Reek Other Clinician: Referring Physician: Fulton Reek Treating Physician/Extender: Frann Rider in Treatment: 15 Vital Signs Height(in): 68 Pulse(bpm): 78 Weight(lbs): 175 Blood Pressure 117/87 (mmHg): Body Mass Index(BMI): 27 Temperature(F): 98.4 Respiratory Rate 18 (breaths/min): Photos: [2:No Photos] [N/A:N/A] Wound Location:  [2:Sacrum] [N/A:N/A] Wounding Event: [2:Gradually Appeared] [N/A:N/A] Primary Etiology: [2:Pressure Ulcer] [N/A:N/A] Comorbid History: [2:Chronic Obstructive Pulmonary Disease (COPD), Arrhythmia, Congestive Heart Failure, Coronary Artery Disease, Hypertension, Peripheral Venous Disease, Osteoarthritis] [N/A:N/A] Date Acquired: [2:06/01/2015] [N/A:N/A] Weeks of Treatment: [2:15] [N/A:N/A] Wound Status: [2:Open] [N/A:N/A] Measurements L x W x D 0.5x0.3x0.1 [N/A:N/A] (cm) Area (cm) : [2:0.118] [N/A:N/A] Volume (cm) : [2:0.012] [N/A:N/A] % Reduction in Area: [2:99.20%] [N/A:N/A] % Reduction in Volume: 99.20% [N/A:N/A] Classification: [2:Category/Stage III] [N/A:N/A] Exudate Amount: [2:Small] [N/A:N/A] Exudate Type: [2:Serous] [N/A:N/A] Exudate Color: [2:amber] [N/A:N/A] Wound Margin: [2:Flat and Intact] [N/A:N/A] Granulation Amount: [  2:None Present (0%)] [N/A:N/A] Necrotic Amount: [2:Large (67-100%)] [N/A:N/A] Exposed Structures: [2:Fascia: No Fat: No] [N/A:N/A] Tendon: No Muscle: No Joint: No Bone: No Limited to Skin Breakdown Epithelialization: Medium (34-66%) N/A N/A Periwound Skin Texture: No Abnormalities Noted N/A N/A Periwound Skin Moist: Yes N/A N/A Moisture: Periwound Skin Color: Erythema: No N/A N/A Temperature: No Abnormality N/A N/A Tenderness on Yes N/A N/A Palpation: Wound Preparation: Ulcer Cleansing: N/A N/A Rinsed/Irrigated with Saline Topical Anesthetic Applied: Other: lidocaine 4% Treatment Notes Electronic Signature(s) Signed: 01/12/2016 4:53:07 PM By: Alric Quan Entered By: Alric Quan on 01/12/2016 12:59:40 [name] [name] ([id number]) -------------------------------------------------------------------------------- [place] Details Patient Name: [name] [name] [name] B. Date of Service: 01/12/2016 12:45 PM Medical Record Number: [id number] Patient Account Number: [id number] Date of Birth/Sex: 1945-07-13 (70 y.o.  Male) Treating RN: Ahmed Prima Primary Care Physician: Fulton Reek Other Clinician: Referring Physician: Fulton Reek Treating Physician/Extender: Frann Rider in Treatment: 15 Active Inactive Orientation to the Wound Care Program Nursing Diagnoses: Knowledge deficit related to the wound healing center program Goals: Patient/caregiver will verbalize understanding of the [place] Program Date Initiated: 10/20/2015 Goal Status: Active Interventions: Provide education on orientation to the wound center Notes: Pressure Nursing Diagnoses: Knowledge deficit related to management of pressures ulcers Goals: Patient will remain free from development of additional pressure ulcers Date Initiated: 10/20/2015 Goal Status: Active Interventions: Assess: immobility, friction, shearing, incontinence upon admission and as needed Notes: Electronic Signature(s) Signed: 01/12/2016 4:53:07 PM By: Alric Quan Entered By: Alric Quan on 01/12/2016 12:59:34 [name] [name] ([id number]) -------------------------------------------------------------------------------- Pain Assessment Details Patient Name: [name] [name] [name] B. Date of Service: 01/12/2016 12:45 PM Medical Record Number: [id number] Patient Account Number: [id number] Date of Birth/Sex: [birth year] (69 y.o. Male) Treating RN: Ahmed Prima Primary Care Physician: Fulton Reek Other Clinician: Referring Physician: Fulton Reek Treating Physician/Extender: Frann Rider in Treatment: 15 Active Problems Location of Pain Severity and Description of Pain Patient Has Paino No Site Locations With Dressing Change: No Pain Management and Medication Current Pain Management: Electronic Signature(s) Signed: 01/12/2016 4:53:07 PM By: Alric Quan Entered By: Alric Quan on 01/12/2016 12:50:12 [name] [name]  ([id number]) -------------------------------------------------------------------------------- Patient/Caregiver Education Details Patient Name: [name] [name] [name] B. Date of Service: 01/12/2016 12:45 PM Medical Record Number: [id number] Patient Account Number: [id number] Date of Birth/Gender: June 16, 1945 (70 y.o. Male) Treating RN: Ahmed Prima Primary Care Physician: Fulton Reek Other Clinician: Referring Physician: Fulton Reek Treating Physician/Extender: Frann Rider in Treatment: 15 Education Assessment Education Provided To: Patient Education Topics Provided Wound/Skin Impairment: Handouts: Other: change dressing as ordered Methods: Demonstration, Explain/Verbal Responses: State content correctly Electronic Signature(s) Signed: 01/12/2016 4:53:07 PM By: Alric Quan Entered By: Alric Quan on 01/12/2016 13:06:46 [name] [name] ([id number]) -------------------------------------------------------------------------------- Wound Assessment Details Patient Name: [name] [name] [name] B. Date of Service: 01/12/2016 12:45 PM Medical Record Number: [id number] Patient Account Number: [id number] Date of Birth/Sex: 1945-11-22 (70 y.o. Male) Treating RN: Ahmed Prima Primary Care Physician: Fulton Reek Other Clinician: Referring Physician: Fulton Reek Treating Physician/Extender: Frann Rider in Treatment: 15 Wound Status Wound Number: 2 Primary Pressure Ulcer Etiology: Wound Location: Sacrum Wound Open Wounding Event: Gradually Appeared Status: Date Acquired: 06/01/2015 Comorbid Chronic Obstructive Pulmonary Disease Weeks Of Treatment: 15 History: (COPD), Arrhythmia, Congestive Heart Clustered Wound: No Failure, Coronary Artery Disease, Hypertension, Peripheral Venous Disease, Osteoarthritis Photos Photo Uploaded By: Alric Quan on 01/12/2016 13:51:50 Wound Measurements Length: (cm) 0.5 Width: (cm) 0.3 Depth: (cm) 0.1 Area:  (cm) 0.118 Volume: (cm) 0.012 % Reduction in Area: 99.2% % Reduction in Volume: 99.2%  Epithelialization: Medium (34-66%) Tunneling: No Undermining: No Wound Description Classification: Category/Stage III Wound Margin: Flat and Intact Exudate Amount: Small Exudate Type: Serous Exudate Color: amber Foul Odor After Cleansing: No Wound Bed Granulation Amount: None Present (0%) Exposed Structure Necrotic Amount: Large (67-100%) Fascia Exposed: No [name] [name] B. ([id number]) Necrotic Quality: Adherent Slough Fat Layer Exposed: No Tendon Exposed: No Muscle Exposed: No Joint Exposed: No Bone Exposed: No Limited to Skin Breakdown Periwound Skin Texture Texture Color No Abnormalities Noted: No No Abnormalities Noted: No Erythema: No Moisture No Abnormalities Noted: No Temperature / Pain Moist: Yes Temperature: No Abnormality Tenderness on Palpation: Yes Wound Preparation Ulcer Cleansing: Rinsed/Irrigated with Saline Topical Anesthetic Applied: Other: lidocaine 4%, Treatment Notes Wound #2 (Sacrum) 1. Cleansed with: Clean wound with Normal Saline 2. Anesthetic Topical Lidocaine 4% cream to wound bed prior to debridement 3. Peri-wound Care: Skin Prep 4. Dressing Applied: Medihoney Gel 5. Secondary Dressing Applied Bordered Foam Dressing Dry Gauze Electronic Signature(s) Signed: 01/12/2016 4:53:07 PM By: Alric Quan Entered By: Alric Quan on 01/12/2016 12:59:28 [name] [name] ([id number]) -------------------------------------------------------------------------------- [place] Details Patient Name: [name] [name], Ripley B. Date of Service: 01/12/2016 12:45 PM Medical Record Number: [id number] Patient Account Number: [id number] Date of Birth/Sex: 1946-01-21 (70 y.o. Male) Treating RN: Ahmed Prima Primary Care Physician: Fulton Reek Other Clinician: Referring Physician: Fulton Reek Treating Physician/Extender: Frann Rider in  Treatment: 15 Vital Signs Time Taken: 12:50 Temperature (F): 98.4 Height (in): 68 Pulse (bpm): 78 Weight (lbs): 175 Respiratory Rate (breaths/min): 18 Body Mass Index (BMI): 26.6 Blood Pressure (mmHg): 117/87 Reference Range: 80 - 120 mg / dl Electronic Signature(s) Signed: 01/12/2016 4:53:07 PM By: Alric Quan Entered By: Alric Quan on 01/12/2016 12:52:08

## 2016-01-19 ENCOUNTER — Encounter: Payer: Medicare Other | Admitting: Surgery

## 2016-01-19 ENCOUNTER — Ambulatory Visit: Payer: Medicare Other | Attending: Anesthesiology | Admitting: Anesthesiology

## 2016-01-19 ENCOUNTER — Encounter: Payer: Self-pay | Admitting: Anesthesiology

## 2016-01-19 VITALS — BP 150/69 | HR 75 | Temp 98.4°F | Resp 18 | Ht 68.0 in | Wt 170.0 lb

## 2016-01-19 DIAGNOSIS — M5136 Other intervertebral disc degeneration, lumbar region: Secondary | ICD-10-CM | POA: Insufficient documentation

## 2016-01-19 DIAGNOSIS — M7918 Myalgia, other site: Secondary | ICD-10-CM

## 2016-01-19 DIAGNOSIS — M47816 Spondylosis without myelopathy or radiculopathy, lumbar region: Secondary | ICD-10-CM

## 2016-01-19 DIAGNOSIS — M5441 Lumbago with sciatica, right side: Secondary | ICD-10-CM | POA: Diagnosis not present

## 2016-01-19 DIAGNOSIS — I739 Peripheral vascular disease, unspecified: Secondary | ICD-10-CM | POA: Diagnosis not present

## 2016-01-19 DIAGNOSIS — Z79891 Long term (current) use of opiate analgesic: Secondary | ICD-10-CM | POA: Diagnosis not present

## 2016-01-19 DIAGNOSIS — Z888 Allergy status to other drugs, medicaments and biological substances status: Secondary | ICD-10-CM | POA: Insufficient documentation

## 2016-01-19 DIAGNOSIS — Z880 Allergy status to penicillin: Secondary | ICD-10-CM | POA: Diagnosis not present

## 2016-01-19 DIAGNOSIS — G8929 Other chronic pain: Secondary | ICD-10-CM | POA: Insufficient documentation

## 2016-01-19 DIAGNOSIS — M791 Myalgia: Secondary | ICD-10-CM | POA: Insufficient documentation

## 2016-01-19 DIAGNOSIS — I350 Nonrheumatic aortic (valve) stenosis: Secondary | ICD-10-CM | POA: Insufficient documentation

## 2016-01-19 DIAGNOSIS — M4696 Unspecified inflammatory spondylopathy, lumbar region: Secondary | ICD-10-CM | POA: Diagnosis not present

## 2016-01-19 DIAGNOSIS — Z7982 Long term (current) use of aspirin: Secondary | ICD-10-CM | POA: Diagnosis not present

## 2016-01-19 DIAGNOSIS — L89109 Pressure ulcer of unspecified part of back, unspecified stage: Secondary | ICD-10-CM | POA: Insufficient documentation

## 2016-01-19 DIAGNOSIS — L89153 Pressure ulcer of sacral region, stage 3: Secondary | ICD-10-CM | POA: Diagnosis not present

## 2016-01-19 DIAGNOSIS — M797 Fibromyalgia: Secondary | ICD-10-CM

## 2016-01-19 MED ORDER — FENTANYL 100 MCG/HR TD PT72: 100.0000 ug | MEDICATED_PATCH | TRANSDERMAL | 0 refills | Status: DC

## 2016-01-19 NOTE — Progress Notes (Signed)
Safety precautions to be maintained throughout the outpatient stay will include: orient to surroundings, keep bed in low position, maintain call bell within reach at all times, provide assistance with transfer out of bed and ambulation.  

## 2016-01-20 NOTE — Progress Notes (Signed)
BERTIE, RAO ([id number]) Visit Report for 01/19/2016 Chief Complaint Document Details Patient Name: [name], [name] Date of Service: 01/19/2016 10:45 AM Medical Record Number: [id number] Patient Account Number: [id number] Date of Birth/Sex: June 08, 1945 (70 y.o. Male) Treating RN: Montey Hora Primary Care Physician: Fulton Reek Other Clinician: Referring Physician: Fulton Reek Treating Physician/Extender: Frann Rider in Treatment: 16 Information Obtained from: Patient Chief Complaint Patient presents to the wound care center for a consult due non healing wound for about 4 months now Electronic Signature(s) Signed: 01/19/2016 11:06:56 AM By: Christin Fudge MD, FACS Entered By: Christin Fudge on 01/19/2016 11:06:55 [name] ([id number]) -------------------------------------------------------------------------------- HPI Details Patient Name: [name], [name] Date of Service: 01/19/2016 10:45 AM Medical Record Number: [id number] Patient Account Number: [id number] Date of Birth/Sex: Jul 21, 1945 (70 y.o. Male) Treating RN: Montey Hora Primary Care Physician: Fulton Reek Other Clinician: Referring Physician: Fulton Reek Treating Physician/Extender: Frann Rider in Treatment: 16 History of Present Illness Location: ulcerated areas in the region of the sacrum bilaterally Quality: Patient reports experiencing a shooting pain to affected area(s). Severity: Patient states wound are getting worse. Duration: Patient has had the wound for > 3 months prior to seeking treatment at the wound center Timing: Pain in wound is constant (hurts all the time) Context: The wound would happen gradually Modifying Factors: Other treatment(s) tried include:has been treated by his PCP and has already been on chronic pain medications Associated Signs and Symptoms: Patient reports having difficulty standing for long periods. HPI Description: 70 year old  gentleman who was referred to [place] by his PCP Dr. Fulton Reek for decubitus ulcers in the region of the sacrum which are very painful and these had them for about 4 months now. The patient's past medical history includes aortic stenosis, atrial fibrillation, coronary artery disease, CHF, COPD, coronary artery disease, prediabetes, peripheral vascular disease, nicotine addiction. In the past he's had carotid artery angioplasty and coronary artery bypass graft and VSD repair. He now has a chronic hip condition which is causing him a lot of pain and he goes to a pain clinic for a long while for this. He's been a smoker for over 40 years and smokes about a pack of cigarettes a day. His last hemoglobin A1c done in February of this year was 5.7. 10/06/2015 -- he continues to have a lot of pain around his decubitus ulcers and other than that has been trying his best to offload. He is also working on his smoking cessation. 01/12/2016 -- he has not been here for 3 weeks due to other issues and has been doing his dressing as advised. Electronic Signature(s) Signed: 01/19/2016 11:07:02 AM By: Christin Fudge MD, FACS Entered By: Christin Fudge on 01/19/2016 11:07:02 [name] ([id number]) -------------------------------------------------------------------------------- Physical Exam Details Patient Name: [name], [name] Date of Service: 01/19/2016 10:45 AM Medical Record Number: [id number] Patient Account Number: [id number] Date of Birth/Sex: 13-Mar-1946 (70 y.o. Male) Treating RN: Montey Hora Primary Care Physician: Fulton Reek Other Clinician: Referring Physician: Fulton Reek Treating Physician/Extender: Frann Rider in Treatment: 16 Constitutional . Pulse regular. Respirations normal and unlabored. Afebrile. . Eyes Nonicteric. Reactive to light. Ears, Nose, Mouth, and Throat Lips, teeth, and gums WNL.Marland Kitchen Moist mucosa without lesions. Neck supple and nontender. No  palpable supraclavicular or cervical adenopathy. Normal sized without goiter. Respiratory WNL. No retractions.. Breath sounds WNL, No rubs, rales, rhonchi, or wheeze.. Cardiovascular Heart rhythm and rate regular, no murmur or gallop.. Pedal Pulses WNL. No clubbing, cyanosis or edema. Lymphatic No adneopathy. No  adenopathy. No adenopathy. Musculoskeletal Adexa without tenderness or enlargement.. Digits and nails w/o clubbing, cyanosis, infection, petechiae, ischemia, or inflammatory conditions.. Integumentary (Hair, Skin) No suspicious lesions. No crepitus or fluctuance. No peri-wound warmth or erythema. No masses.Marland Kitchen Psychiatric Judgement and insight Intact.. No evidence of depression, anxiety, or agitation.. Notes The wound is small, has some epithelialization and overall has improved Electronic Signature(s) Signed: 01/19/2016 11:07:47 AM By: Christin Fudge MD, FACS Entered By: Christin Fudge on 01/19/2016 11:07:46 [name] ([id number]) -------------------------------------------------------------------------------- Physician Orders Details Patient Name: [name], [name] Date of Service: 01/19/2016 10:45 AM Medical Record Number: [id number] Patient Account Number: [id number] Date of Birth/Sex: Sep 07, 1945 (70 y.o. Male) Treating RN: Cornell Barman Primary Care Physician: Fulton Reek Other Clinician: Referring Physician: Fulton Reek Treating Physician/Extender: Frann Rider in Treatment: 51 Verbal / Phone Orders: Yes Clinician: Cornell Barman Read Back and Verified: Yes Diagnosis Coding Wound Cleansing Wound #2 Sacrum o Clean wound with Normal Saline. o Cleanse wound with mild soap and water o May Shower, gently pat wound dry prior to applying new dressing. Skin Barriers/Peri-Wound Care Wound #2 Sacrum o Skin Prep Primary Wound Dressing Wound #2 Sacrum o Medihoney gel Secondary Dressing Wound #2 Sacrum o Dry Gauze o Boardered Foam  Dressing Dressing Change Frequency Wound #2 Sacrum o Change dressing every other day. Follow-up Appointments Wound #2 Sacrum o Return Appointment in 1 week. Off-Loading Wound #2 Sacrum o Turn and reposition every 2 hours Additional Orders / Instructions Wound #2 Sacrum o Stop Smoking o Increase protein intake. KINTE, GRIEVES ([id number]) Medications-please add to medication list. Wound #2 Sacrum o Other: - Vitamin C, Zinc, Multivitamins Electronic Signature(s) Signed: 01/19/2016 3:04:46 PM By: Christin Fudge MD, FACS Signed: 01/19/2016 4:44:50 PM By: Gretta Cool, RN, BSN, Kim RN, BSN Entered By: Gretta Cool, RN, BSN, Kim on 01/19/2016 11:03:59 [name] ([id number]) -------------------------------------------------------------------------------- Problem List Details Patient Name: [name], Dakai B. Date of Service: 01/19/2016 10:45 AM Medical Record Number: [id number] Patient Account Number: [id number] Date of Birth/Sex: [birth year] (71 y.o. Male) Treating RN: Montey Hora Primary Care Physician: Fulton Reek Other Clinician: Referring Physician: Fulton Reek Treating Physician/Extender: Frann Rider in Treatment: 16 Active Problems ICD-10 Encounter Code Description Active Date Diagnosis L89.153 Pressure ulcer of sacral region, stage 3 09/29/2015 Yes F17.218 Nicotine dependence, cigarettes, with other nicotine- 09/29/2015 Yes induced disorders G89.4 Chronic pain syndrome 09/29/2015 Yes Inactive Problems Resolved Problems ICD-10 Code Description Active Date Resolved Date L89.313 Pressure ulcer of right buttock, stage 3 10/20/2015 10/20/2015 Electronic Signature(s) Signed: 01/19/2016 11:06:47 AM By: Christin Fudge MD, FACS Entered By: Christin Fudge on 01/19/2016 11:06:47 [name] ([id number]) -------------------------------------------------------------------------------- Progress Note Details Patient Name: [name], Edder B. Date of  Service: 01/19/2016 10:45 AM Medical Record Number: [id number] Patient Account Number: [id number] Date of Birth/Sex: 1946-02-13 (70 y.o. Male) Treating RN: Montey Hora Primary Care Physician: Fulton Reek Other Clinician: Referring Physician: Fulton Reek Treating Physician/Extender: Frann Rider in Treatment: 16 Subjective Chief Complaint Information obtained from Patient Patient presents to the wound care center for a consult due non healing wound for about 4 months now History of Present Illness (HPI) The following HPI elements were documented for the patient's wound: Location: ulcerated areas in the region of the sacrum bilaterally Quality: Patient reports experiencing a shooting pain to affected area(s). Severity: Patient states wound are getting worse. Duration: Patient has had the wound for > 3 months prior to seeking treatment at the wound center Timing: Pain in wound is constant (hurts all the time) Context: The  wound would happen gradually Modifying Factors: Other treatment(s) tried include:has been treated by his PCP and has already been on chronic pain medications Associated Signs and Symptoms: Patient reports having difficulty standing for long periods. 70 year old gentleman who was referred to [place] by his PCP Dr. Fulton Reek for decubitus ulcers in the region of the sacrum which are very painful and these had them for about 4 months now. The patient's past medical history includes aortic stenosis, atrial fibrillation, coronary artery disease, CHF, COPD, coronary artery disease, prediabetes, peripheral vascular disease, nicotine addiction. In the past he's had carotid artery angioplasty and coronary artery bypass graft and VSD repair. He now has a chronic hip condition which is causing him a lot of pain and he goes to a pain clinic for a long while for this. He's been a smoker for over 40 years and smokes about a pack of cigarettes a day. His last hemoglobin  A1c done in February of this year was 5.7. 10/06/2015 -- he continues to have a lot of pain around his decubitus ulcers and other than that has been trying his best to offload. He is also working on his smoking cessation. 01/12/2016 -- he has not been here for 3 weeks due to other issues and has been doing his dressing as advised. Objective Constitutional Pulse regular. Respirations normal and unlabored. Afebrile. MUSCOLINO, Juno B. ([id number]) Vitals Time Taken: 10:53 AM, Height: 68 in, Weight: 175 lbs, BMI: 26.6, Temperature: 98.4 F, Pulse: 63 bpm, Respiratory Rate: 16 breaths/min, Blood Pressure: 125/45 mmHg. Eyes Nonicteric. Reactive to light. Ears, Nose, Mouth, and Throat Lips, teeth, and gums WNL.Marland Kitchen Moist mucosa without lesions. Neck supple and nontender. No palpable supraclavicular or cervical adenopathy. Normal sized without goiter. Respiratory WNL. No retractions.. Breath sounds WNL, No rubs, rales, rhonchi, or wheeze.. Cardiovascular Heart rhythm and rate regular, no murmur or gallop.. Pedal Pulses WNL. No clubbing, cyanosis or edema. Lymphatic No adneopathy. No adenopathy. No adenopathy. Musculoskeletal Adexa without tenderness or enlargement.. Digits and nails w/o clubbing, cyanosis, infection, petechiae, ischemia, or inflammatory conditions.Marland Kitchen Psychiatric Judgement and insight Intact.. No evidence of depression, anxiety, or agitation.. General Notes: The wound is small, has some epithelialization and overall has improved Integumentary (Hair, Skin) No suspicious lesions. No crepitus or fluctuance. No peri-wound warmth or erythema. No masses.. Wound #2 status is Open. Original cause of wound was Gradually Appeared. The wound is located on the Sacrum. The wound measures 0.5cm length x 0.2cm width x 0.1cm depth; 0.079cm^2 area and 0.008cm^3 volume. The wound is limited to skin breakdown. There is no tunneling or undermining noted. There is a small amount of serous drainage  noted. The wound margin is flat and intact. There is small (1-33%) granulation within the wound bed. There is a medium (34-66%) amount of necrotic tissue within the wound bed including Adherent Slough. The periwound skin appearance exhibited: Scarring. The periwound skin appearance did not exhibit: Callus, Crepitus, Excoriation, Fluctuance, Friable, Induration, Localized Edema, Rash, Dry/Scaly, Maceration, Moist, Atrophie Blanche, Cyanosis, Ecchymosis, Hemosiderin Staining, Mottled, Pallor, Rubor, Erythema. Periwound temperature was noted as No Abnormality. The periwound has tenderness on palpation. Assessment OLUWAFEMI, LOURY B. ([id number]) Active Problems ICD-10 L89.153 - Pressure ulcer of sacral region, stage 3 F17.218 - Nicotine dependence, cigarettes, with other nicotine-induced disorders G89.4 - Chronic pain syndrome Plan Wound Cleansing: Wound #2 Sacrum: Clean wound with Normal Saline. Cleanse wound with mild soap and water May Shower, gently pat wound dry prior to applying new dressing. Skin Barriers/Peri-Wound Care: Wound #2 Sacrum: Skin  Prep Primary Wound Dressing: Wound #2 Sacrum: Medihoney gel Secondary Dressing: Wound #2 Sacrum: Dry Gauze Boardered Foam Dressing Dressing Change Frequency: Wound #2 Sacrum: Change dressing every other day. Follow-up Appointments: Wound #2 Sacrum: Return Appointment in 1 week. Off-Loading: Wound #2 Sacrum: Turn and reposition every 2 hours Additional Orders / Instructions: Wound #2 Sacrum: Stop Smoking Increase protein intake. Medications-please add to medication list.: Wound #2 Sacrum: Other: - Vitamin C, Zinc, Multivitamins Camire, Menno B. ([id number]) The wound is looking much better today and I would continue with medihoney and a bordered foam and change it daily. Electronic Signature(s) Signed: 01/19/2016 11:08:46 AM By: Christin Fudge MD, FACS Entered By: Christin Fudge on 01/19/2016 11:08:46 [name] -------------------------------------------------------------------------------- SuperBill Details Patient Name: [name], Joriel B. Date of Service: 01/19/2016 Medical Record Number: [id number] Patient Account Number: [id number] Date of Birth/Sex: 06-05-1945 (70 y.o. Male) Treating RN: Montey Hora Primary Care Physician: Fulton Reek Other Clinician: Referring Physician: Fulton Reek Treating Physician/Extender: Frann Rider in Treatment: 16 Diagnosis Coding ICD-10 Codes Code Description [phone] Pressure ulcer of sacral region, stage 3 F17.218 Nicotine dependence, cigarettes, with other nicotine-induced disorders G89.4 Chronic pain syndrome Facility Procedures CPT4 Code: [id number] Description: [phone] - WOUND CARE VISIT-LEV 2 EST PT Modifier: Quantity: 1 Physician Procedures CPT4 Code Description: [id number] - WC PHYS LEVEL 3 - EST PT ICD-10 Description Diagnosis L89.153 Pressure ulcer of sacral region, stage 3 F17.218 Nicotine dependence, cigarettes, with other nicoti G89.4 Chronic pain syndrome Modifier: ne-induced dis Quantity: 1 orders Electronic Signature(s) Signed: 01/19/2016 3:04:46 PM By: Christin Fudge MD, FACS Signed: 01/19/2016 4:44:50 PM By: Gretta Cool RN, BSN, Kim RN, BSN Previous Signature: 01/19/2016 11:08:59 AM Version By: Christin Fudge MD, FACS Entered By: Gretta Cool RN, BSN, Kim on 01/19/2016 11:09:30

## 2016-01-20 NOTE — Progress Notes (Signed)
[name] ([id number]) Visit Report for 01/19/2016 Arrival Information Details Patient Name: [name], [name] Date of Service: 01/19/2016 10:45 AM Medical Record Number: [id number] Patient Account Number: [id number] Date of Birth/Sex: 09-Apr-1946 (70 y.o. Male) Treating RN: Cornell Barman Primary Care Physician: Fulton Reek Other Clinician: Referring Physician: Fulton Reek Treating Physician/Extender: Frann Rider in Treatment: 25 Visit Information History Since Last Visit Added or deleted any medications: No Patient Arrived: Ambulatory Any new allergies or adverse reactions: No Arrival Time: 10:51 Had a fall or experienced change in No Accompanied By: wife activities of daily living that may affect Transfer Assistance: None risk of falls: Patient Identification Verified: Yes Signs or symptoms of abuse/neglect since last No Secondary Verification Process Yes visito Completed: Hospitalized since last visit: No Patient Requires Transmission-Based No Pain Present Now: Yes Precautions: Patient Has Alerts: No Electronic Signature(s) Signed: 01/19/2016 4:44:50 PM By: Gretta Cool, RN, BSN, Kim RN, BSN Entered By: Gretta Cool, RN, BSN, Kim on 01/19/2016 10:52:03 [name] ([id number]) -------------------------------------------------------------------------------- Clinic Level of Care Assessment Details Patient Name: [name], [name] Date of Service: 01/19/2016 10:45 AM Medical Record Number: [id number] Patient Account Number: [id number] Date of Birth/Sex: 06-05-1945 (70 y.o. Male) Treating RN: Cornell Barman Primary Care Physician: Fulton Reek Other Clinician: Referring Physician: Fulton Reek Treating Physician/Extender: Frann Rider in Treatment: 16 Clinic Level of Care Assessment Items TOOL 4 Quantity Score []  - Use when only an EandM is performed on FOLLOW-UP visit 0 ASSESSMENTS - Nursing Assessment / Reassessment []  - Reassessment of  Co-morbidities (includes updates in patient status) 0 X - Reassessment of Adherence to Treatment Plan 1 5 ASSESSMENTS - Wound and Skin Assessment / Reassessment X - Simple Wound Assessment / Reassessment - one wound 1 5 []  - Complex Wound Assessment / Reassessment - multiple wounds 0 []  - Dermatologic / Skin Assessment (not related to wound area) 0 ASSESSMENTS - Focused Assessment []  - Circumferential Edema Measurements - multi extremities 0 []  - Nutritional Assessment / Counseling / Intervention 0 []  - Lower Extremity Assessment (monofilament, tuning fork, pulses) 0 []  - Peripheral Arterial Disease Assessment (using hand held doppler) 0 ASSESSMENTS - Ostomy and/or Continence Assessment and Care []  - Incontinence Assessment and Management 0 []  - Ostomy Care Assessment and Management (repouching, etc.) 0 PROCESS - Coordination of Care X - Simple Patient / Family Education for ongoing care 1 15 []  - Complex (extensive) Patient / Family Education for ongoing care 0 X - Staff obtains Programmer, systems, Records, Test Results / Process Orders 1 10 []  - Staff telephones HHA, Nursing Homes / Clarify orders / etc 0 []  - Routine Transfer to another Facility (non-emergent condition) 0 [name], [name] ([id number]) []  - Routine Hospital Admission (non-emergent condition) 0 []  - New Admissions / Biomedical engineer / Ordering NPWT, Apligraf, etc. 0 []  - Emergency Hospital Admission (emergent condition) 0 X - Simple Discharge Coordination 1 10 []  - Complex (extensive) Discharge Coordination 0 PROCESS - Special Needs []  - Pediatric / Minor Patient Management 0 []  - Isolation Patient Management 0 []  - Hearing / Language / Visual special needs 0 []  - Assessment of Community assistance (transportation, D/C planning, etc.) 0 []  - Additional assistance / Altered mentation 0 []  - Support Surface(s) Assessment (bed, cushion, seat, etc.) 0 INTERVENTIONS - Wound Cleansing / Measurement X - Simple Wound  Cleansing - one wound 1 5 []  - Complex Wound Cleansing - multiple wounds 0 X - Wound Imaging (photographs - any number of wounds) 1 5 []  - Wound Tracing (  instead of photographs) 0 X - Simple Wound Measurement - one wound 1 5 []  - Complex Wound Measurement - multiple wounds 0 INTERVENTIONS - Wound Dressings X - Small Wound Dressing one or multiple wounds 1 10 []  - Medium Wound Dressing one or multiple wounds 0 []  - Large Wound Dressing one or multiple wounds 0 []  - Application of Medications - topical 0 []  - Application of Medications - injection 0 INTERVENTIONS - Miscellaneous []  - External ear exam 0 [name], [name] ([id number]) []  - Specimen Collection (cultures, biopsies, blood, body fluids, etc.) 0 []  - Specimen(s) / Culture(s) sent or taken to Lab for analysis 0 []  - Patient Transfer (multiple staff / Harrel Lemon Lift / Similar devices) 0 []  - Simple Staple / Suture removal (25 or less) 0 []  - Complex Staple / Suture removal (26 or more) 0 []  - Hypo / Hyperglycemic Management (close monitor of Blood Glucose) 0 []  - Ankle / Brachial Index (ABI) - do not check if billed separately 0 X - Vital Signs 1 5 Has the patient been seen at the hospital within the last three years: Yes Total Score: 75 Level Of Care: New/Established - Level 2 Electronic Signature(s) Signed: 01/19/2016 4:44:50 PM By: Gretta Cool, RN, BSN, Kim RN, BSN Entered By: Gretta Cool, RN, BSN, Kim on 01/19/2016 11:09:02 [name] ([id number]) -------------------------------------------------------------------------------- Encounter Discharge Information Details Patient Name: [name], [name] Date of Service: 01/19/2016 10:45 AM Medical Record Number: [id number] Patient Account Number: [id number] Date of Birth/Sex: 08-24-1945 (70 y.o. Male) Treating RN: Cornell Barman Primary Care Physician: Fulton Reek Other Clinician: Referring Physician: Fulton Reek Treating Physician/Extender: Frann Rider in Treatment:  47 Encounter Discharge Information Items Discharge Pain Level: 0 Discharge Condition: Stable Ambulatory Status: Ambulatory Discharge Destination: Home Transportation: Private Auto Accompanied By: wife Schedule Follow-up Appointment: Yes Medication Reconciliation completed and provided to Patient/Care Yes Chantille Navarrete: Provided on Clinical Summary of Care: 01/19/2016 Form Type Recipient Paper Patient PB Electronic Signature(s) Signed: 01/19/2016 11:12:11 AM By: Ruthine Dose Entered By: Ruthine Dose on 01/19/2016 11:12:11 [name] ([id number]) -------------------------------------------------------------------------------- Multi Wound Chart Details Patient Name: [name], Mung B. Date of Service: 01/19/2016 10:45 AM Medical Record Number: [id number] Patient Account Number: [id number] Date of Birth/Sex: Oct 04, 1945 (70 y.o. Male) Treating RN: Cornell Barman Primary Care Physician: Fulton Reek Other Clinician: Referring Physician: Fulton Reek Treating Physician/Extender: Frann Rider in Treatment: 16 Vital Signs Height(in): 68 Pulse(bpm): 63 Weight(lbs): 175 Blood Pressure 125/45 (mmHg): Body Mass Index(BMI): 27 Temperature(F): 98.4 Respiratory Rate 16 (breaths/min): Photos: [2:No Photos] [N/A:N/A] Wound Location: [2:Sacrum] [N/A:N/A] Wounding Event: [2:Gradually Appeared] [N/A:N/A] Primary Etiology: [2:Pressure Ulcer] [N/A:N/A] Comorbid History: [2:Chronic Obstructive Pulmonary Disease (COPD), Arrhythmia, Congestive Heart Failure, Coronary Artery Disease, Hypertension, Peripheral Venous Disease, Osteoarthritis] [N/A:N/A] Date Acquired: [2:06/01/2015] [N/A:N/A] Weeks of Treatment: [2:16] [N/A:N/A] Wound Status: [2:Open] [N/A:N/A] Measurements L x W x D 0.5x0.2x0.1 [N/A:N/A] (cm) Area (cm) : [2:0.079] [N/A:N/A] Volume (cm) : [2:0.008] [N/A:N/A] % Reduction in Area: [2:99.50%] [N/A:N/A] % Reduction in Volume: 99.50% [N/A:N/A] Classification:  [2:Category/Stage III] [N/A:N/A] Exudate Amount: [2:Small] [N/A:N/A] Exudate Type: [2:Serous] [N/A:N/A] Exudate Color: [2:amber] [N/A:N/A] Wound Margin: [2:Flat and Intact] [N/A:N/A] Granulation Amount: [2:Small (1-33%)] [N/A:N/A] Necrotic Amount: [2:Medium (34-66%)] [N/A:N/A] Exposed Structures: [2:Fascia: No Fat: No] [N/A:N/A] Tendon: No Muscle: No Joint: No Bone: No Limited to Skin Breakdown Epithelialization: Medium (34-66%) N/A N/A Periwound Skin Texture: Scarring: Yes N/A N/A Edema: No Excoriation: No Induration: No Callus: No Crepitus: No Fluctuance: No Friable: No Rash: No Periwound Skin Maceration: No N/A N/A Moisture: Moist: No  Dry/Scaly: No Periwound Skin Color: Atrophie Blanche: No N/A N/A Cyanosis: No Ecchymosis: No Erythema: No Hemosiderin Staining: No Mottled: No Pallor: No Rubor: No Temperature: No Abnormality N/A N/A Tenderness on Yes N/A N/A Palpation: Wound Preparation: Ulcer Cleansing: N/A N/A Rinsed/Irrigated with Saline Topical Anesthetic Applied: Other: lidocaine 4% Treatment Notes Electronic Signature(s) Signed: 01/19/2016 4:44:50 PM By: Gretta Cool, RN, BSN, Kim RN, BSN Entered By: Gretta Cool, RN, BSN, Kim on 01/19/2016 10:59:19 [name] ([id number]) -------------------------------------------------------------------------------- [place] Details Patient Name: [name], [name] Date of Service: 01/19/2016 10:45 AM Medical Record Number: [id number] Patient Account Number: [id number] Date of Birth/Sex: 1945/11/08 (70 y.o. Male) Treating RN: Cornell Barman Primary Care Physician: Fulton Reek Other Clinician: Referring Physician: Fulton Reek Treating Physician/Extender: Frann Rider in Treatment: 5 Active Inactive Orientation to the Wound Care Program Nursing Diagnoses: Knowledge deficit related to the wound healing center program Goals: Patient/caregiver will verbalize understanding of the [place] [place] Program Date Initiated: 10/20/2015 Goal Status: Active Interventions: Provide education on orientation to the wound center Notes: Pressure Nursing Diagnoses: Knowledge deficit related to management of pressures ulcers Goals: Patient will remain free from development of additional pressure ulcers Date Initiated: 10/20/2015 Goal Status: Active Interventions: Assess: immobility, friction, shearing, incontinence upon admission and as needed Notes: Electronic Signature(s) Signed: 01/19/2016 4:44:50 PM By: Gretta Cool, RN, BSN, Kim RN, BSN Entered By: Gretta Cool, RN, BSN, Kim on 01/19/2016 10:59:04 [name] ([id number]) -------------------------------------------------------------------------------- Pain Assessment Details Patient Name: [name], [name] Date of Service: 01/19/2016 10:45 AM Medical Record Number: [id number] Patient Account Number: [id number] Date of Birth/Sex: July 04, 1945 (70 y.o. Male) Treating RN: Cornell Barman Primary Care Physician: Fulton Reek Other Clinician: Referring Physician: Fulton Reek Treating Physician/Extender: Frann Rider in Treatment: 16 Active Problems Location of Pain Severity and Description of Pain Patient Has Paino Yes Site Locations Pain Location: Pain in Ulcers With Dressing Change: Yes Duration of the Pain. Constant / Intermittento Intermittent Rate the pain. Current Pain Level: 3 Character of Pain Describe the Pain: Other: stinging Pain Management and Medication Current Pain Management: Goals for Pain Management Topical or injectable lidocaine is offered to patient for acute pain when surgical debridement is performed. If needed, Patient is instructed to use over the counter pain medication for the following 24-48 hours after debridement. Wound care MDs do not prescribed pain medications. Patient has chronic pain or uncontrolled pain. Patient has been instructed to make an appointment with their Primary  Care Physician for pain management. Electronic Signature(s) Signed: 01/19/2016 4:44:50 PM By: Gretta Cool, RN, BSN, Kim RN, BSN Entered By: Gretta Cool, RN, BSN, Kim on 01/19/2016 10:53:02 [name] ([id number]) -------------------------------------------------------------------------------- Patient/Caregiver Education Details Patient Name: [name], [name] Date of Service: 01/19/2016 10:45 AM Medical Record Number: [id number] Patient Account Number: [id number] Date of Birth/Gender: 1946/02/19 (70 y.o. Male) Treating RN: Cornell Barman Primary Care Physician: Fulton Reek Other Clinician: Referring Physician: Fulton Reek Treating Physician/Extender: Frann Rider in Treatment: 16 Education Assessment Education Provided To: Patient Education Topics Provided Wound/Skin Impairment: Handouts: Caring for Your Ulcer, Other: continue wound care as prescribed Methods: Demonstration Responses: State content correctly Electronic Signature(s) Signed: 01/19/2016 4:44:50 PM By: Gretta Cool, RN, BSN, Kim RN, BSN Entered By: Gretta Cool, RN, BSN, Kim on 01/19/2016 11:12:05 [name] ([id number]) -------------------------------------------------------------------------------- Wound Assessment Details Patient Name: [name], [name] Date of Service: 01/19/2016 10:45 AM Medical Record Number: [id number] Patient Account Number: [id number] Date of Birth/Sex: 1945-08-13 (70 y.o. Male) Treating RN: Cornell Barman Primary Care Physician: Fulton Reek Other Clinician: Referring  Physician: Fulton Reek Treating Physician/Extender: Frann Rider in Treatment: 16 Wound Status Wound Number: 2 Primary Pressure Ulcer Etiology: Wound Location: Sacrum Wound Open Wounding Event: Gradually Appeared Status: Date Acquired: 06/01/2015 Comorbid Chronic Obstructive Pulmonary Disease Weeks Of Treatment: 16 History: (COPD), Arrhythmia, Congestive Heart Clustered Wound: No Failure, Coronary  Artery Disease, Hypertension, Peripheral Venous Disease, Osteoarthritis Photos Photo Uploaded By: Gretta Cool, RN, BSN, Kim on 01/19/2016 12:56:25 Wound Measurements Length: (cm) 0.5 Width: (cm) 0.2 Depth: (cm) 0.1 Area: (cm) 0.079 Volume: (cm) 0.008 % Reduction in Area: 99.5% % Reduction in Volume: 99.5% Epithelialization: Medium (34-66%) Tunneling: No Undermining: No Wound Description Classification: Category/Stage III Wound Margin: Flat and Intact Exudate Amount: Small Exudate Type: Serous Exudate Color: amber Foul Odor After Cleansing: No Wound Bed Granulation Amount: Small (1-33%) Exposed Structure Necrotic Amount: Medium (34-66%) Fascia Exposed: No Necrotic Quality: Adherent Slough Fat Layer Exposed: No Tendon Exposed: No [name], [name] ([id number]) Muscle Exposed: No Joint Exposed: No Bone Exposed: No Limited to Skin Breakdown Periwound Skin Texture Texture Color No Abnormalities Noted: No No Abnormalities Noted: No Callus: No Atrophie Blanche: No Crepitus: No Cyanosis: No Excoriation: No Ecchymosis: No Fluctuance: No Erythema: No Friable: No Hemosiderin Staining: No Induration: No Mottled: No Localized Edema: No Pallor: No Rash: No Rubor: No Scarring: Yes Temperature / Pain Moisture Temperature: No Abnormality No Abnormalities Noted: No Tenderness on Palpation: Yes Dry / Scaly: No Maceration: No Moist: No Wound Preparation Ulcer Cleansing: Rinsed/Irrigated with Saline Topical Anesthetic Applied: Other: lidocaine 4%, Treatment Notes Wound #2 (Sacrum) 1. Cleansed with: Clean wound with Normal Saline 2. Anesthetic Topical Lidocaine 4% cream to wound bed prior to debridement 4. Dressing Applied: Medihoney Gel 5. Secondary [place] Signature(s) Signed: 01/19/2016 4:44:50 PM By: Gretta Cool, RN, BSN, Kim RN, BSN Entered By: Gretta Cool, RN, BSN, Kim on 01/19/2016 10:58:54 [name]  ([id number]) -------------------------------------------------------------------------------- [place] Details Patient Name: [name], [name] Date of Service: 01/19/2016 10:45 AM Medical Record Number: [id number] Patient Account Number: [id number] Date of Birth/Sex: [birth year] (69 y.o. Male) Treating RN: Cornell Barman Primary Care Physician: Fulton Reek Other Clinician: Referring Physician: Fulton Reek Treating Physician/Extender: Frann Rider in Treatment: 16 Vital Signs Time Taken: 10:53 Temperature (F): 98.4 Height (in): 68 Pulse (bpm): 63 Weight (lbs): 175 Respiratory Rate (breaths/min): 16 Body Mass Index (BMI): 26.6 Blood Pressure (mmHg): 125/45 Reference Range: 80 - 120 mg / dl Electronic Signature(s) Signed: 01/19/2016 4:44:50 PM By: Gretta Cool, RN, BSN, Kim RN, BSN Entered By: Gretta Cool, RN, BSN, Kim on 01/19/2016 10:53:27

## 2016-01-20 NOTE — Progress Notes (Signed)
Chief complaint is low back pain  Procedure: none  History of present illness: [name] comes in today for a 2 month follow-up. He's been taking his medication as prescribed and doing well with this regimen. No other changes are noted. His strength and lower extremity function are stable and the quality characteristic and distribution of pain once again have remained stable. Otherwise he is in his usual state of health. He continues follow-up with his primary care physicians for his baseline medical problems. He denies any troubles with his current medication regimen and based on his narcotic assessment sheet he is tolerating his medicines without difficulty. He denies diverting or illicit use.    BP (!) 150/69 (Patient Position: Sitting, Cuff Size: Normal)   Pulse 75   Temp 98.4 F (36.9 C) (Oral)   Resp 18   Ht 5\' 8"  (1.727 m)   Wt 170 lb (77.1 kg)   SpO2 100%   BMI 25.85 kg/m    Current Outpatient Prescriptions:  .  aspirin 325 MG tablet, Take 325 mg by mouth daily., Disp: , Rfl:  .  busPIRone (BUSPAR) 10 MG tablet, Take 10 mg by mouth 3 (three) times daily. Reported on 11/11/2015, Disp: , Rfl:  .  busPIRone (BUSPAR) 15 MG tablet, Take 15 mg by mouth 3 (three) times daily. , Disp: , Rfl:  .  butalbital-acetaminophen-caffeine (FIORICET, ESGIC) 50-325-40 MG per tablet, Take 1 tablet by mouth 2 (two) times daily as needed for headache. Reported on 11/11/2015, Disp: , Rfl:  .  butalbital-aspirin-caffeine (FIORINAL) 50-325-40 MG capsule, Take 1 capsule by mouth 2 (two) times daily as needed for headache., Disp: , Rfl:  .  citalopram (CELEXA) 10 MG tablet, Take 10 mg by mouth daily., Disp: , Rfl:  .  clotrimazole-betamethasone (LOTRISONE) cream, Apply 1 application topically 2 (two) times daily. Reported on 11/11/2015, Disp: , Rfl:  .  docusate sodium (COLACE) 100 MG capsule, Take 100 mg by mouth 2 (two) times daily., Disp: , Rfl:  .  fentaNYL (DURAGESIC - DOSED MCG/HR) 100 MCG/HR,  Place 1 patch (100 mcg total) onto the skin every other day., Disp: 15 patch, Rfl: 0 .  gemfibrozil (LOPID) 600 MG tablet, Take 600 mg by mouth 2 (two) times daily before a meal., Disp: , Rfl:  .  lidocaine (LIDOCAINE PAK) 5 % ointment, Apply 1 application topically as needed., Disp: , Rfl:  .  lisinopril (PRINIVIL,ZESTRIL) 10 MG tablet, Take 10 mg by mouth daily., Disp: , Rfl:  .  meperidine (DEMEROL) 50 MG tablet, Take 1 tablet (50 mg total) by mouth 1 day or 1 dose., Disp: 30 tablet, Rfl: 0 .  metoprolol tartrate (LOPRESSOR) 25 MG tablet, Take 25 mg by mouth daily., Disp: , Rfl:  .  Oxycodone HCl 10 MG TABS, Take by mouth. Prn, does not take every day, Disp: , Rfl:  .  tamsulosin (FLOMAX) 0.4 MG CAPS capsule, Take 0.4 mg by mouth daily., Disp: , Rfl:   Patient Active Problem List   Diagnosis Date Noted  . Stage I pressure ulcer of back 11/03/2015  . Facet arthritis of lumbar region 09/26/2014  . DDD (degenerative disc disease), lumbar 09/26/2014  . Myofacial muscle pain 09/26/2014    Allergies  Allergen Reactions  . Celebrex [Celecoxib] Other (See Comments)    Chest pain  . Penicillins Other (See Comments)    unknown    Physical exam pupils are equally round and reactive to light  Extraocular muscles are intact  Heart is regular rate and rhythm and lower extremity strength and function remains a baseline with no significant changes noted.  His right leg strength compared to left slightly diminished which is his baseline. Otherwise no change on examination noted  Assessment  #1 chronic low back pain  #2 chronic opioid management #19facet arthropathy #4 peripheral vascular disease and history of aortic stenosis with valve repair  Plan: We'll refill medications at present with return to clinic in the next 2 months for reevaluation. He  is to continue with physical therapy exercises and aerobic conditioning as tolerated and continue follow-up with his primary care physician, Dr.  Doy Hutching. Should he have any changes in his symptom quality characteristic and distribution or strength he has been instructed to contact us for further evaluation. At this point he seems to be doing quite well with his regimen.  Dr. Vashti Hey 8:40 AM

## 2016-02-02 ENCOUNTER — Ambulatory Visit: Payer: Medicare Other | Admitting: Nurse Practitioner

## 2016-02-19 ENCOUNTER — Encounter: Payer: Medicare Other | Attending: Surgery | Admitting: Surgery

## 2016-02-19 DIAGNOSIS — L89153 Pressure ulcer of sacral region, stage 3: Secondary | ICD-10-CM | POA: Diagnosis present

## 2016-02-19 DIAGNOSIS — G894 Chronic pain syndrome: Secondary | ICD-10-CM | POA: Diagnosis not present

## 2016-02-19 DIAGNOSIS — R7303 Prediabetes: Secondary | ICD-10-CM | POA: Insufficient documentation

## 2016-02-19 DIAGNOSIS — I509 Heart failure, unspecified: Secondary | ICD-10-CM | POA: Insufficient documentation

## 2016-02-19 DIAGNOSIS — I739 Peripheral vascular disease, unspecified: Secondary | ICD-10-CM | POA: Diagnosis not present

## 2016-02-19 DIAGNOSIS — F17218 Nicotine dependence, cigarettes, with other nicotine-induced disorders: Secondary | ICD-10-CM | POA: Diagnosis not present

## 2016-02-19 DIAGNOSIS — I4891 Unspecified atrial fibrillation: Secondary | ICD-10-CM | POA: Insufficient documentation

## 2016-02-19 DIAGNOSIS — I2510 Atherosclerotic heart disease of native coronary artery without angina pectoris: Secondary | ICD-10-CM | POA: Insufficient documentation

## 2016-02-19 DIAGNOSIS — I350 Nonrheumatic aortic (valve) stenosis: Secondary | ICD-10-CM | POA: Diagnosis not present

## 2016-02-19 DIAGNOSIS — J449 Chronic obstructive pulmonary disease, unspecified: Secondary | ICD-10-CM | POA: Insufficient documentation

## 2016-02-20 NOTE — Progress Notes (Signed)
[name], [name] ([id number]) Visit Report for 02/19/2016 Chief Complaint Document Details Patient Name: [name], [name] Date of Service: 02/19/2016 1:30 PM Medical Record Number: [id number] Patient Account Number: [id number] Date of Birth/Sex: [birth year] (70 y.o. Male) Treating RN: Montey Hora Primary Care Physician: Fulton Reek Other Clinician: Referring Physician: Fulton Reek Treating Physician/Extender: Frann Rider in Treatment: 20 Information Obtained from: Patient Chief Complaint Patient presents to the wound care center for a consult due non healing wound for about 4 months now Electronic Signature(s) Signed: 02/19/2016 2:08:26 PM By: Christin Fudge MD, FACS Entered By: Christin Fudge on 02/19/2016 14:08:26 [name] ([id number]) -------------------------------------------------------------------------------- HPI Details Patient Name: [name], [name] Date of Service: 02/19/2016 1:30 PM Medical Record Number: [id number] Patient Account Number: [id number] Date of Birth/Sex: 1945-11-09 (70 y.o. Male) Treating RN: Montey Hora Primary Care Physician: Fulton Reek Other Clinician: Referring Physician: Fulton Reek Treating Physician/Extender: Frann Rider in Treatment: 20 History of Present Illness Location: ulcerated areas in the region of the sacrum bilaterally Quality: Patient reports experiencing a shooting pain to affected area(s). Severity: Patient states wound are getting worse. Duration: Patient has had the wound for > 3 months prior to seeking treatment at the wound center Timing: Pain in wound is constant (hurts all the time) Context: The wound would happen gradually Modifying Factors: Other treatment(s) tried include:has been treated by his PCP and has already been on chronic pain medications Associated Signs and Symptoms: Patient reports having difficulty standing for long periods. HPI Description: 70 year old  gentleman who was referred to [place] by his PCP Dr. Fulton Reek for decubitus ulcers in the region of the sacrum which are very painful and these had them for about 4 months now. The patient's past medical history includes aortic stenosis, atrial fibrillation, coronary artery disease, CHF, COPD, coronary artery disease, prediabetes, peripheral vascular disease, nicotine addiction. In the past he's had carotid artery angioplasty and coronary artery bypass graft and VSD repair. He now has a chronic hip condition which is causing him a lot of pain and he goes to a pain clinic for a long while for this. He's been a smoker for over 40 years and smokes about a pack of cigarettes a day. His last hemoglobin A1c done in February of this year was 5.7. 10/06/2015 -- he continues to have a lot of pain around his decubitus ulcers and other than that has been trying his best to offload. He is also working on his smoking cessation. 01/12/2016 -- he has not been here for 3 weeks due to other issues and has been doing his dressing as advised. Electronic Signature(s) Signed: 02/19/2016 2:08:34 PM By: Christin Fudge MD, FACS Entered By: Christin Fudge on 02/19/2016 14:08:34 [name] ([id number]) -------------------------------------------------------------------------------- Physical Exam Details Patient Name: [name], [name] Date of Service: 02/19/2016 1:30 PM Medical Record Number: [id number] Patient Account Number: [id number] Date of Birth/Sex: Oct 05, 1945 (70 y.o. Male) Treating RN: Montey Hora Primary Care Physician: Fulton Reek Other Clinician: Referring Physician: Fulton Reek Treating Physician/Extender: Frann Rider in Treatment: 20 Constitutional . Pulse regular. Respirations normal and unlabored. Afebrile. . Eyes Nonicteric. Reactive to light. Ears, Nose, Mouth, and Throat Lips, teeth, and gums WNL.Marland Kitchen Moist mucosa without lesions. Neck supple and nontender. No  palpable supraclavicular or cervical adenopathy. Normal sized without goiter. Respiratory WNL. No retractions.. Cardiovascular Pedal Pulses WNL. No clubbing, cyanosis or edema. Chest Breasts symmetical and no nipple discharge.. Breast tissue WNL, no masses, lumps, or tenderness.. Lymphatic No adneopathy. No adenopathy. No adenopathy.  Musculoskeletal Adexa without tenderness or enlargement.. Digits and nails w/o clubbing, cyanosis, infection, petechiae, ischemia, or inflammatory conditions.. Integumentary (Hair, Skin) No suspicious lesions. No crepitus or fluctuance. No peri-wound warmth or erythema. No masses.Marland Kitchen Psychiatric Judgement and insight Intact.. No evidence of depression, anxiety, or agitation.. Notes wound looks excellent and except for a small divot there is healthy epithelialization and no debridement was required today. Electronic Signature(s) Signed: 02/19/2016 2:09:06 PM By: Christin Fudge MD, FACS Entered By: Christin Fudge on 02/19/2016 14:09:06 [name] ([id number]) -------------------------------------------------------------------------------- Physician Orders Details Patient Name: [name], [name] Date of Service: 02/19/2016 1:30 PM Medical Record Number: [id number] Patient Account Number: [id number] Date of Birth/Sex: 09-Jun-1945 (70 y.o. Male) Treating RN: Baruch Gouty, RN, BSN, Velva Harman Primary Care Physician: Fulton Reek Other Clinician: Referring Physician: Fulton Reek Treating Physician/Extender: Frann Rider in Treatment: 74 Verbal / Phone Orders: Yes Clinician: Afful, RN, BSN, Rita Read Back and Verified: Yes Diagnosis Coding Wound Cleansing Wound #2 Sacrum o Clean wound with Normal Saline. o Cleanse wound with mild soap and water o May Shower, gently pat wound dry prior to applying new dressing. Skin Barriers/Peri-Wound Care Wound #2 Sacrum o Skin Prep Primary Wound Dressing Wound #2 Sacrum o Medihoney gel Secondary  Dressing Wound #2 Sacrum o Dry Gauze o Boardered Foam Dressing Dressing Change Frequency Wound #2 Sacrum o Change dressing every other day. Follow-up Appointments Wound #2 Sacrum o Return Appointment in: - 3 weeks Off-Loading Wound #2 Sacrum o Turn and reposition every 2 hours Additional Orders / Instructions Wound #2 Sacrum o Stop Smoking o Increase protein intake. [name], [name] ([id number]) Medications-please add to medication list. Wound #2 Sacrum o Other: - Vitamin C, Zinc, Multivitamins Electronic Signature(s) Signed: 02/19/2016 4:19:07 PM By: Christin Fudge MD, FACS Signed: 02/19/2016 5:07:41 PM By: Regan Lemming BSN, RN Entered By: Regan Lemming on 02/19/2016 14:01:43 [name] ([id number]) -------------------------------------------------------------------------------- Problem List Details Patient Name: [name], [name] Date of Service: 02/19/2016 1:30 PM Medical Record Number: [id number] Patient Account Number: [id number] Date of Birth/Sex: 01/12/1946 (70 y.o. Male) Treating RN: Montey Hora Primary Care Physician: Fulton Reek Other Clinician: Referring Physician: Fulton Reek Treating Physician/Extender: Frann Rider in Treatment: 20 Active Problems ICD-10 Encounter Code Description Active Date Diagnosis L89.153 Pressure ulcer of sacral region, stage 3 09/29/2015 Yes F17.218 Nicotine dependence, cigarettes, with other nicotine- 09/29/2015 Yes induced disorders G89.4 Chronic pain syndrome 09/29/2015 Yes Inactive Problems Resolved Problems ICD-10 Code Description Active Date Resolved Date L89.313 Pressure ulcer of right buttock, stage 3 10/20/2015 10/20/2015 Electronic Signature(s) Signed: 02/19/2016 2:08:17 PM By: Christin Fudge MD, FACS Entered By: Christin Fudge on 02/19/2016 14:08:16 [name] ([id number]) -------------------------------------------------------------------------------- Progress Note  Details Patient Name: [name], [name] Date of Service: 02/19/2016 1:30 PM Medical Record Number: [id number] Patient Account Number: [id number] Date of Birth/Sex: [birth year] (70 y.o. Male) Treating RN: Montey Hora Primary Care Physician: Fulton Reek Other Clinician: Referring Physician: Fulton Reek Treating Physician/Extender: Frann Rider in Treatment: 20 Subjective Chief Complaint Information obtained from Patient Patient presents to the wound care center for a consult due non healing wound for about 4 months now History of Present Illness (HPI) The following HPI elements were documented for the patient's wound: Location: ulcerated areas in the region of the sacrum bilaterally Quality: Patient reports experiencing a shooting pain to affected area(s). Severity: Patient states wound are getting worse. Duration: Patient has had the wound for > 3 months prior to seeking treatment at the wound center Timing: Pain in wound is constant (  hurts all the time) Context: The wound would happen gradually Modifying Factors: Other treatment(s) tried include:has been treated by his PCP and has already been on chronic pain medications Associated Signs and Symptoms: Patient reports having difficulty standing for long periods. 70 year old gentleman who was referred to [place] by his PCP Dr. Fulton Reek for decubitus ulcers in the region of the sacrum which are very painful and these had them for about 4 months now. The patient's past medical history includes aortic stenosis, atrial fibrillation, coronary artery disease, CHF, COPD, coronary artery disease, prediabetes, peripheral vascular disease, nicotine addiction. In the past he's had carotid artery angioplasty and coronary artery bypass graft and VSD repair. He now has a chronic hip condition which is causing him a lot of pain and he goes to a pain clinic for a long while for this. He's been a smoker for over 40 years and smokes  about a pack of cigarettes a day. His last hemoglobin A1c done in February of this year was 5.7. 10/06/2015 -- he continues to have a lot of pain around his decubitus ulcers and other than that has been trying his best to offload. He is also working on his smoking cessation. 01/12/2016 -- he has not been here for 3 weeks due to other issues and has been doing his dressing as advised. Objective Constitutional Pulse regular. Respirations normal and unlabored. Afebrile. [name], [name] ([id number]) Vitals Time Taken: 1:42 PM, Height: 68 in, Weight: 175 lbs, BMI: 26.6, Temperature: 98.5 F, Pulse: 81 bpm, Respiratory Rate: 16 breaths/min, Blood Pressure: 139/64 mmHg. Eyes Nonicteric. Reactive to light. Ears, Nose, Mouth, and Throat Lips, teeth, and gums WNL.Marland Kitchen Moist mucosa without lesions. Neck supple and nontender. No palpable supraclavicular or cervical adenopathy. Normal sized without goiter. Respiratory WNL. No retractions.. Cardiovascular Pedal Pulses WNL. No clubbing, cyanosis or edema. Chest Breasts symmetical and no nipple discharge.. Breast tissue WNL, no masses, lumps, or tenderness.. Lymphatic No adneopathy. No adenopathy. No adenopathy. Musculoskeletal Adexa without tenderness or enlargement.. Digits and nails w/o clubbing, cyanosis, infection, petechiae, ischemia, or inflammatory conditions.Marland Kitchen Psychiatric Judgement and insight Intact.. No evidence of depression, anxiety, or agitation.. General Notes: wound looks excellent and except for a small divot there is healthy epithelialization and no debridement was required today. Integumentary (Hair, Skin) No suspicious lesions. No crepitus or fluctuance. No peri-wound warmth or erythema. No masses.. Wound #2 status is Open. Original cause of wound was Gradually Appeared. The wound is located on the Sacrum. The wound measures 0.2cm length x 0.2cm width x 0.1cm depth; 0.031cm^2 area and 0.003cm^3 volume. The wound is limited  to skin breakdown. There is no tunneling or undermining noted. There is a small amount of serous drainage noted. The wound margin is flat and intact. There is medium (34-66%) pink granulation within the wound bed. There is a medium (34-66%) amount of necrotic tissue within the wound bed including Adherent Slough. The periwound skin appearance exhibited: Scarring. The periwound skin appearance did not exhibit: Callus, Crepitus, Excoriation, Fluctuance, Friable, Induration, Localized Edema, Rash, Dry/Scaly, Maceration, Moist, Atrophie Blanche, Cyanosis, Ecchymosis, Hemosiderin Staining, Mottled, Pallor, Rubor, Erythema. Periwound temperature was noted as No Abnormality. The periwound has tenderness on palpation. [name], [name] ([id number]) Assessment Active Problems ICD-10 L89.153 - Pressure ulcer of sacral region, stage 3 F17.218 - Nicotine dependence, cigarettes, with other nicotine-induced disorders G89.4 - Chronic pain syndrome Plan Wound Cleansing: Wound #2 Sacrum: Clean wound with Normal Saline. Cleanse wound with mild soap and water May Shower, gently pat wound dry  prior to applying new dressing. Skin Barriers/Peri-Wound Care: Wound #2 Sacrum: Skin Prep Primary Wound Dressing: Wound #2 Sacrum: Medihoney gel Secondary Dressing: Wound #2 Sacrum: Dry Gauze Boardered Foam Dressing Dressing Change Frequency: Wound #2 Sacrum: Change dressing every other day. Follow-up Appointments: Wound #2 Sacrum: Return Appointment in: - 3 weeks Off-Loading: Wound #2 Sacrum: Turn and reposition every 2 hours Additional Orders / Instructions: Wound #2 Sacrum: Stop Smoking Increase protein intake. Medications-please add to medication list.: Wound #2 Sacrum: Other: - Vitamin C, Zinc, Multivitamins [name], [name] ([id number]) I anticipate discharge soon and I have recommended we continue with medihoney and offloading as before. Electronic Signature(s) Signed: 02/19/2016 2:09:54  PM By: Christin Fudge MD, FACS Entered By: Christin Fudge on 02/19/2016 14:09:54 [name] ([id number]) -------------------------------------------------------------------------------- SuperBill Details Patient Name: [name], Cord [name] Date of Service: 02/19/2016 Medical Record Number: [id number] Patient Account Number: [id number] Date of Birth/Sex: 10/30/1945 (70 y.o. Male) Treating RN: Montey Hora Primary Care Physician: Fulton Reek Other Clinician: Referring Physician: Fulton Reek Treating Physician/Extender: Frann Rider in Treatment: 20 Diagnosis Coding ICD-10 Codes Code Description [phone] Pressure ulcer of sacral region, stage 3 F17.218 Nicotine dependence, cigarettes, with other nicotine-induced disorders G89.4 Chronic pain syndrome Facility Procedures CPT4 Code: [id number] Description: [phone] - WOUND CARE VISIT-LEV 2 EST PT Modifier: Quantity: 1 Physician Procedures CPT4 Code Description: [id number] - WC PHYS LEVEL 3 - EST PT ICD-10 Description Diagnosis L89.153 Pressure ulcer of sacral region, stage 3 F17.218 Nicotine dependence, cigarettes, with other nicoti G89.4 Chronic pain syndrome Modifier: ne-induced dis Quantity: 1 orders Electronic Signature(s) Signed: 02/19/2016 2:10:16 PM By: Christin Fudge MD, FACS Entered By: Christin Fudge on 02/19/2016 14:10:16

## 2016-02-20 NOTE — Progress Notes (Signed)
TAGGART, WASHABAUGH ([id number]) Visit Report for 02/19/2016 Arrival Information Details Patient Name: [name], [name] Date of Service: 02/19/2016 1:30 PM Medical Record Number: [id number] Patient Account Number: [id number] Date of Birth/Sex: 23-Mar-1946 (70 y.o. Male) Treating RN: Montey Hora Primary Care Physician: Fulton Reek Other Clinician: Referring Physician: Fulton Reek Treating Physician/Extender: Frann Rider in Treatment: 66 Visit Information History Since Last Visit Added or deleted any medications: No Patient Arrived: Ambulatory Any new allergies or adverse reactions: No Arrival Time: 13:42 Had a fall or experienced change in No Accompanied By: spouse activities of daily living that may affect Transfer Assistance: None risk of falls: Patient Identification Verified: Yes Signs or symptoms of abuse/neglect since last No Secondary Verification Process Yes visito Completed: Hospitalized since last visit: No Patient Requires Transmission-Based No Pain Present Now: No Precautions: Patient Has Alerts: No Electronic Signature(s) Signed: 02/19/2016 5:14:40 PM By: Montey Hora Entered By: Montey Hora on 02/19/2016 13:42:23 [name] ([id number]) -------------------------------------------------------------------------------- Clinic Level of Care Assessment Details Patient Name: [name] Ada, [name] Date of Service: 02/19/2016 1:30 PM Medical Record Number: [id number] Patient Account Number: [id number] Date of Birth/Sex: December 08, 1945 (70 y.o. Male) Treating RN: Baruch Gouty, RN, BSN, [place] Primary Care Physician: Fulton Reek Other Clinician: Referring Physician: Fulton Reek Treating Physician/Extender: Frann Rider in Treatment: 20 Clinic Level of Care Assessment Items TOOL 4 Quantity Score []  - Use when only an EandM is performed on FOLLOW-UP visit 0 ASSESSMENTS - Nursing Assessment / Reassessment X - Reassessment of  Co-morbidities (includes updates in patient status) 1 10 X - Reassessment of Adherence to Treatment Plan 1 5 ASSESSMENTS - Wound and Skin Assessment / Reassessment X - Simple Wound Assessment / Reassessment - one wound 1 5 []  - Complex Wound Assessment / Reassessment - multiple wounds 0 []  - Dermatologic / Skin Assessment (not related to wound area) 0 ASSESSMENTS - Focused Assessment []  - Circumferential Edema Measurements - multi extremities 0 []  - Nutritional Assessment / Counseling / Intervention 0 []  - Lower Extremity Assessment (monofilament, tuning fork, pulses) 0 []  - Peripheral Arterial Disease Assessment (using hand held doppler) 0 ASSESSMENTS - Ostomy and/or Continence Assessment and Care []  - Incontinence Assessment and Management 0 []  - Ostomy Care Assessment and Management (repouching, etc.) 0 PROCESS - Coordination of Care X - Simple Patient / Family Education for ongoing care 1 15 []  - Complex (extensive) Patient / Family Education for ongoing care 0 []  - Staff obtains Programmer, systems, Records, Test Results / Process Orders 0 []  - Staff telephones HHA, Nursing Homes / Clarify orders / etc 0 []  - Routine Transfer to another Facility (non-emergent condition) 0 [name] ([id number]) []  - Routine Hospital Admission (non-emergent condition) 0 []  - New Admissions / Biomedical engineer / Ordering NPWT, Apligraf, etc. 0 []  - Emergency Hospital Admission (emergent condition) 0 []  - Simple Discharge Coordination 0 []  - Complex (extensive) Discharge Coordination 0 PROCESS - Special Needs []  - Pediatric / Minor Patient Management 0 []  - Isolation Patient Management 0 []  - Hearing / Language / Visual special needs 0 []  - Assessment of Community assistance (transportation, D/C planning, etc.) 0 []  - Additional assistance / Altered mentation 0 []  - Support Surface(s) Assessment (bed, cushion, seat, etc.) 0 INTERVENTIONS - Wound Cleansing / Measurement X - Simple Wound  Cleansing - one wound 1 5 []  - Complex Wound Cleansing - multiple wounds 0 X - Wound Imaging (photographs - any number of wounds) 1 5 []  - Wound Tracing (instead of photographs) 0 X -  Simple Wound Measurement - one wound 1 5 []  - Complex Wound Measurement - multiple wounds 0 INTERVENTIONS - Wound Dressings X - Small Wound Dressing one or multiple wounds 1 10 []  - Medium Wound Dressing one or multiple wounds 0 []  - Large Wound Dressing one or multiple wounds 0 []  - Application of Medications - topical 0 []  - Application of Medications - injection 0 INTERVENTIONS - Miscellaneous []  - External ear exam 0 [name], [name] ([id number]) []  - Specimen Collection (cultures, biopsies, blood, body fluids, etc.) 0 []  - Specimen(s) / Culture(s) sent or taken to Lab for analysis 0 []  - Patient Transfer (multiple staff / Harrel Lemon Lift / Similar devices) 0 []  - Simple Staple / Suture removal (25 or less) 0 []  - Complex Staple / Suture removal (26 or more) 0 []  - Hypo / Hyperglycemic Management (close monitor of Blood Glucose) 0 []  - Ankle / Brachial Index (ABI) - do not check if billed separately 0 X - Vital Signs 1 5 Has the patient been seen at the hospital within the last three years: Yes Total Score: 65 Level Of Care: New/Established - Level 2 Electronic Signature(s) Signed: 02/19/2016 5:07:41 PM By: Regan Lemming BSN, RN Entered By: Regan Lemming on 02/19/2016 14:02:26 [name] ([id number]) -------------------------------------------------------------------------------- Encounter Discharge Information Details Patient Name: [name] Ada, [name] Date of Service: 02/19/2016 1:30 PM Medical Record Number: [id number] Patient Account Number: [id number] Date of Birth/Sex: 02-06-1946 (70 y.o. Male) Treating RN: Montey Hora Primary Care Physician: Fulton Reek Other Clinician: Referring Physician: Fulton Reek Treating Physician/Extender: Frann Rider in Treatment:  20 Encounter Discharge Information Items Discharge Pain Level: 0 Discharge Condition: Stable Ambulatory Status: Ambulatory Discharge Destination: Home Transportation: Private Auto Accompanied By: spouse Schedule Follow-up Appointment: Yes Medication Reconciliation completed and provided to Patient/Care No Vladislav Axelson: Provided on Clinical Summary of Care: 02/19/2016 Form Type Recipient Paper Patient PB Electronic Signature(s) Signed: 02/19/2016 2:10:19 PM By: Ruthine Dose Entered By: Ruthine Dose on 02/19/2016 14:10:19 [name] ([id number]) -------------------------------------------------------------------------------- Multi Wound Chart Details Patient Name: [name] Ada, [name] Date of Service: 02/19/2016 1:30 PM Medical Record Number: [id number] Patient Account Number: [id number] Date of Birth/Sex: 1945/11/15 (70 y.o. Male) Treating RN: Baruch Gouty, RN, BSN, Velva Harman Primary Care Physician: Fulton Reek Other Clinician: Referring Physician: Fulton Reek Treating Physician/Extender: Frann Rider in Treatment: 20 Vital Signs Height(in): 68 Pulse(bpm): 81 Weight(lbs): 175 Blood Pressure 139/64 (mmHg): Body Mass Index(BMI): 27 Temperature(F): 98.5 Respiratory Rate 16 (breaths/min): Photos: [2:No Photos] [N/A:N/A] Wound Location: [2:Sacrum] [N/A:N/A] Wounding Event: [2:Gradually Appeared] [N/A:N/A] Primary Etiology: [2:Pressure Ulcer] [N/A:N/A] Comorbid History: [2:Chronic Obstructive Pulmonary Disease (COPD), Arrhythmia, Congestive Heart Failure, Coronary Artery Disease, Hypertension, Peripheral Venous Disease, Osteoarthritis] [N/A:N/A] Date Acquired: [2:06/01/2015] [N/A:N/A] Weeks of Treatment: [2:20] [N/A:N/A] Wound Status: [2:Open] [N/A:N/A] Measurements L x W x D 0.2x0.2x0.1 [N/A:N/A] (cm) Area (cm) : [2:0.031] [N/A:N/A] Volume (cm) : [2:0.003] [N/A:N/A] % Reduction in Area: [2:99.80%] [N/A:N/A] % Reduction in Volume: 99.80%  [N/A:N/A] Classification: [2:Category/Stage III] [N/A:N/A] Exudate Amount: [2:Small] [N/A:N/A] Exudate Type: [2:Serous] [N/A:N/A] Exudate Color: [2:amber] [N/A:N/A] Wound Margin: [2:Flat and Intact] [N/A:N/A] Granulation Amount: [2:Medium (34-66%)] [N/A:N/A] Granulation Quality: [2:Pink] [N/A:N/A] Necrotic Amount: [2:Medium (34-66%)] [N/A:N/A] Exposed Structures: [N/A:N/A] Fascia: No Fat: No Tendon: No Muscle: No Joint: No Bone: No Limited to Skin Breakdown Epithelialization: Medium (34-66%) N/A N/A Periwound Skin Texture: Scarring: Yes N/A N/A Edema: No Excoriation: No Induration: No Callus: No Crepitus: No Fluctuance: No Friable: No Rash: No Periwound Skin Maceration: No N/A N/A Moisture: Moist: No Dry/Scaly: No Periwound Skin  Color: Atrophie Blanche: No N/A N/A Cyanosis: No Ecchymosis: No Erythema: No Hemosiderin Staining: No Mottled: No Pallor: No Rubor: No Temperature: No Abnormality N/A N/A Tenderness on Yes N/A N/A Palpation: Wound Preparation: Ulcer Cleansing: N/A N/A Rinsed/Irrigated with Saline Topical Anesthetic Applied: Other: lidocaine 4% Treatment Notes Electronic Signature(s) Signed: 02/19/2016 5:07:41 PM By: Regan Lemming BSN, RN Entered By: Regan Lemming on 02/19/2016 14:00:59 [name] ([id number]) -------------------------------------------------------------------------------- [place] Details Patient Name: [name] Ada, [name] Date of Service: 02/19/2016 1:30 PM Medical Record Number: [id number] Patient Account Number: [id number] Date of Birth/Sex: 09/13/1945 (70 y.o. Male) Treating RN: Baruch Gouty, RN, BSN, Velva Harman Primary Care Physician: Fulton Reek Other Clinician: Referring Physician: Fulton Reek Treating Physician/Extender: Frann Rider in Treatment: 54 Active Inactive Orientation to the Wound Care Program Nursing Diagnoses: Knowledge deficit related to the wound healing center  program Goals: Patient/caregiver will verbalize understanding of the [place] Program Date Initiated: 10/20/2015 Goal Status: Active Interventions: Provide education on orientation to the wound center Notes: Pressure Nursing Diagnoses: Knowledge deficit related to management of pressures ulcers Goals: Patient will remain free from development of additional pressure ulcers Date Initiated: 10/20/2015 Goal Status: Active Interventions: Assess: immobility, friction, shearing, incontinence upon admission and as needed Notes: Electronic Signature(s) Signed: 02/19/2016 5:07:41 PM By: Regan Lemming BSN, RN Entered By: Regan Lemming on 02/19/2016 14:00:47 [name] ([id number]) -------------------------------------------------------------------------------- Pain Assessment Details Patient Name: [name] Ada, [name] Date of Service: 02/19/2016 1:30 PM Medical Record Number: [id number] Patient Account Number: [id number] Date of Birth/Sex: April 07, 1946 (70 y.o. Male) Treating RN: Montey Hora Primary Care Physician: Fulton Reek Other Clinician: Referring Physician: Fulton Reek Treating Physician/Extender: Frann Rider in Treatment: 20 Active Problems Location of Pain Severity and Description of Pain Patient Has Paino No Site Locations Pain Management and Medication Current Pain Management: Electronic Signature(s) Signed: 02/19/2016 5:14:40 PM By: Montey Hora Entered By: Montey Hora on 02/19/2016 13:42:28 [name] ([id number]) -------------------------------------------------------------------------------- Patient/Caregiver Education Details Patient Name: [name] Ada, Jaymison B. Date of Service: 02/19/2016 1:30 PM Medical Record Number: [id number] Patient Account Number: [id number] Date of Birth/Gender: 1946-02-10 (70 y.o. Male) Treating RN: Montey Hora Primary Care Physician: Fulton Reek Other Clinician: Referring Physician:  Fulton Reek Treating Physician/Extender: Frann Rider in Treatment: 20 Education Assessment Education Provided To: Patient and Caregiver Education Topics Provided Wound/Skin Impairment: Handouts: Other: wound care to continue as ordered Methods: Demonstration, Explain/Verbal Responses: State content correctly Electronic Signature(s) Signed: 02/19/2016 5:14:40 PM By: Montey Hora Entered By: Montey Hora on 02/19/2016 14:09:59 [name] ([id number]) -------------------------------------------------------------------------------- Wound Assessment Details Patient Name: [name] Ada, [name] Date of Service: 02/19/2016 1:30 PM Medical Record Number: [id number] Patient Account Number: [id number] Date of Birth/Sex: January 07, 1946 (70 y.o. Male) Treating RN: Montey Hora Primary Care Physician: Fulton Reek Other Clinician: Referring Physician: Fulton Reek Treating Physician/Extender: Frann Rider in Treatment: 20 Wound Status Wound Number: 2 Primary Pressure Ulcer Etiology: Wound Location: Sacrum Wound Open Wounding Event: Gradually Appeared Status: Date Acquired: 06/01/2015 Comorbid Chronic Obstructive Pulmonary Disease Weeks Of Treatment: 20 History: (COPD), Arrhythmia, Congestive Heart Clustered Wound: No Failure, Coronary Artery Disease, Hypertension, Peripheral Venous Disease, Osteoarthritis Wound Measurements Length: (cm) 0.2 Width: (cm) 0.2 Depth: (cm) 0.1 Area: (cm) 0.031 Volume: (cm) 0.003 % Reduction in Area: 99.8% % Reduction in Volume: 99.8% Epithelialization: Medium (34-66%) Tunneling: No Undermining: No Wound Description Classification: Category/Stage III Wound Margin: Flat and Intact Exudate Amount: Small Exudate Type: Serous Exudate Color: amber Foul Odor After Cleansing: No Wound Bed Granulation Amount: Medium (34-66%) Exposed  Structure Granulation Quality: Pink Fascia Exposed: No Necrotic Amount: Medium  (34-66%) Fat Layer Exposed: No Necrotic Quality: Adherent Slough Tendon Exposed: No Muscle Exposed: No Joint Exposed: No Bone Exposed: No Limited to Skin Breakdown Periwound Skin Texture Texture Color No Abnormalities Noted: No No Abnormalities Noted: No Callus: No Atrophie Blanche: No Boom, Jaimeson B. ([id number]) Crepitus: No Cyanosis: No Excoriation: No Ecchymosis: No Fluctuance: No Erythema: No Friable: No Hemosiderin Staining: No Induration: No Mottled: No Localized Edema: No Pallor: No Rash: No Rubor: No Scarring: Yes Temperature / Pain Moisture Temperature: No Abnormality No Abnormalities Noted: No Tenderness on Palpation: Yes Dry / Scaly: No Maceration: No Moist: No Wound Preparation Ulcer Cleansing: Rinsed/Irrigated with Saline Topical Anesthetic Applied: Other: lidocaine 4%, Treatment Notes Wound #2 (Sacrum) 1. Cleansed with: Clean wound with Normal Saline 2. Anesthetic Topical Lidocaine 4% cream to wound bed prior to debridement 4. Dressing Applied: Medihoney Gel 5. Secondary Dressing Applied Bordered Foam Dressing Dry Gauze Electronic Signature(s) Signed: 02/19/2016 5:14:40 PM By: Montey Hora Entered By: Montey Hora on 02/19/2016 13:50:39 [name] ([id number]) -------------------------------------------------------------------------------- Vitals Details Patient Name: [name] Ada, [name] Date of Service: 02/19/2016 1:30 PM Medical Record Number: [id number] Patient Account Number: [id number] Date of Birth/Sex: 05/19/1945 (70 y.o. Male) Treating RN: Montey Hora Primary Care Physician: Fulton Reek Other Clinician: Referring Physician: Fulton Reek Treating Physician/Extender: Frann Rider in Treatment: 20 Vital Signs Time Taken: 13:42 Temperature (F): 98.5 Height (in): 68 Pulse (bpm): 81 Weight (lbs): 175 Respiratory Rate (breaths/min): 16 Body Mass Index (BMI): 26.6 Blood Pressure (mmHg):  139/64 Reference Range: 80 - 120 mg / dl Electronic Signature(s) Signed: 02/19/2016 5:14:40 PM By: Montey Hora Entered By: Montey Hora on 02/19/2016 13:44:55

## 2016-03-11 ENCOUNTER — Encounter: Payer: Medicare Other | Attending: Surgery | Admitting: Surgery

## 2016-03-11 DIAGNOSIS — G894 Chronic pain syndrome: Secondary | ICD-10-CM | POA: Insufficient documentation

## 2016-03-11 DIAGNOSIS — I350 Nonrheumatic aortic (valve) stenosis: Secondary | ICD-10-CM | POA: Insufficient documentation

## 2016-03-11 DIAGNOSIS — I509 Heart failure, unspecified: Secondary | ICD-10-CM | POA: Insufficient documentation

## 2016-03-11 DIAGNOSIS — L89153 Pressure ulcer of sacral region, stage 3: Secondary | ICD-10-CM | POA: Diagnosis not present

## 2016-03-11 DIAGNOSIS — I2510 Atherosclerotic heart disease of native coronary artery without angina pectoris: Secondary | ICD-10-CM | POA: Insufficient documentation

## 2016-03-11 DIAGNOSIS — I739 Peripheral vascular disease, unspecified: Secondary | ICD-10-CM | POA: Insufficient documentation

## 2016-03-11 DIAGNOSIS — F17218 Nicotine dependence, cigarettes, with other nicotine-induced disorders: Secondary | ICD-10-CM | POA: Diagnosis not present

## 2016-03-11 DIAGNOSIS — R7303 Prediabetes: Secondary | ICD-10-CM | POA: Diagnosis not present

## 2016-03-11 DIAGNOSIS — J449 Chronic obstructive pulmonary disease, unspecified: Secondary | ICD-10-CM | POA: Insufficient documentation

## 2016-03-11 DIAGNOSIS — I4891 Unspecified atrial fibrillation: Secondary | ICD-10-CM | POA: Insufficient documentation

## 2016-03-12 NOTE — Progress Notes (Addendum)
BAUDILIO, GAVRILOV ([id number]) Visit Report for [name] Arrival Information Details Patient Name: [name], [name] Date of Service: [name] 1:30 PM Medical Record Number: [id number] Patient Account Number: [id number] Date of Birth/Sex: Dec 01, 1945 (70 y.o. Male) Treating RN: [name] Primary Care Physician: [name] Other Clinician: Referring Physician: Fulton [name] Treating Physician/Extender: [name] in Treatment: 23 Visit Information History Since Last Visit Added or deleted any medications: No Patient Arrived: Ambulatory Any new allergies or adverse reactions: No Arrival Time: 13:31 Had a fall or experienced change in No Accompanied By: spouse activities of daily living that may affect Transfer Assistance: None risk of falls: Patient Identification Verified: Yes Signs or symptoms of abuse/neglect since last No Secondary Verification Process Yes visito Completed: Hospitalized since last visit: No Patient Requires Transmission-Based No Pain Present Now: Yes Precautions: Patient Has Alerts: No Electronic Signature(s) Signed: 03/11/2016 5:05:14 PM By: [name] Entered By: [name] 13:33:33 [name] ([id number]) -------------------------------------------------------------------------------- Clinic Level of Care Assessment Details Patient Name: [name], [name] Date of Service: [name] 1:30 PM Medical Record Number: [id number] Patient Account Number: [id number] Date of Birth/Sex: 10-30-1945 (70 y.o. Male) Treating RN: [name] Primary Care Physician: [name] Other Clinician: Referring Physician: Fulton [name] Treating Physician/Extender: [name] in Treatment: 23 Clinic Level of Care Assessment Items TOOL 4 Quantity Score []  - Use when only an EandM is performed on FOLLOW-UP visit 0 ASSESSMENTS - Nursing Assessment / Reassessment X - Reassessment of  Co-morbidities (includes updates in patient status) 1 10 X - Reassessment of Adherence to Treatment Plan 1 5 ASSESSMENTS - Wound and Skin Assessment / Reassessment X - Simple Wound Assessment / Reassessment - one wound 1 5 []  - Complex Wound Assessment / Reassessment - multiple wounds 0 []  - Dermatologic / Skin Assessment (not related to wound area) 0 ASSESSMENTS - Focused Assessment []  - Circumferential Edema Measurements - multi extremities 0 []  - Nutritional Assessment / Counseling / Intervention 0 []  - Lower Extremity Assessment (monofilament, tuning fork, pulses) 0 []  - Peripheral Arterial Disease Assessment (using hand held doppler) 0 ASSESSMENTS - Ostomy and/or Continence Assessment and Care []  - Incontinence Assessment and Management 0 []  - Ostomy Care Assessment and Management (repouching, etc.) 0 PROCESS - Coordination of Care X - Simple Patient / Family Education for ongoing care 1 15 []  - Complex (extensive) Patient / Family Education for ongoing care 0 []  - Staff obtains Programmer, systems, Records, Test Results / Process Orders 0 []  - Staff telephones HHA, Nursing Homes / Clarify orders / etc 0 []  - Routine Transfer to another Facility (non-emergent condition) 0 [name] ([id number]) []  - Routine Hospital Admission (non-emergent condition) 0 []  - New Admissions / Biomedical engineer / Ordering NPWT, Apligraf, etc. 0 []  - Emergency Hospital Admission (emergent condition) 0 X - Simple Discharge Coordination 1 10 []  - Complex (extensive) Discharge Coordination 0 PROCESS - Special Needs []  - Pediatric / Minor Patient Management 0 []  - Isolation Patient Management 0 []  - Hearing / Language / Visual special needs 0 []  - Assessment of Community assistance (transportation, D/C planning, etc.) 0 []  - Additional assistance / Altered mentation 0 []  - Support Surface(s) Assessment (bed, cushion, seat, etc.) 0 INTERVENTIONS - Wound Cleansing / Measurement X - Simple Wound  Cleansing - one wound 1 5 []  - Complex Wound Cleansing - multiple wounds 0 X - Wound Imaging (photographs - any number of wounds) 1 5 []  - Wound Tracing (instead of photographs) 0 X -  Simple Wound Measurement - one wound 1 5 []  - Complex Wound Measurement - multiple wounds 0 INTERVENTIONS - Wound Dressings X - Small Wound Dressing one or multiple wounds 1 10 []  - Medium Wound Dressing one or multiple wounds 0 []  - Large Wound Dressing one or multiple wounds 0 []  - Application of Medications - topical 0 []  - Application of Medications - injection 0 INTERVENTIONS - Miscellaneous []  - External ear exam 0 Drudge, Toure B. ([id number]) []  - Specimen Collection (cultures, biopsies, blood, body fluids, etc.) 0 []  - Specimen(s) / Culture(s) sent or taken to Lab for analysis 0 []  - Patient Transfer (multiple staff / [name] / Similar devices) 0 []  - Simple Staple / Suture removal (25 or less) 0 []  - Complex Staple / Suture removal (26 or more) 0 []  - Hypo / Hyperglycemic Management (close monitor of Blood Glucose) 0 []  - Ankle / Brachial Index (ABI) - do not check if billed separately 0 X - Vital Signs 1 5 Has the patient been seen at the hospital within the last three years: Yes Total Score: 75 Level Of Care: New/Established - Level 2 Electronic Signature(s) Signed: 03/11/2016 5:05:14 PM By: [name] Entered By: [name] 14:11:18 [name] ([id number]) -------------------------------------------------------------------------------- Encounter Discharge Information Details Patient Name: [name], [name] Date of Service: [name] 1:30 PM Medical Record Number: [id number] Patient Account Number: [id number] Date of Birth/Sex: July 15, 1945 (70 y.o. Male) Treating RN: [name] Primary Care Physician: [name] Other Clinician: Referring Physician: Fulton [name] Treating Physician/Extender: [name] in Treatment:  86 Encounter Discharge Information Items Discharge Pain Level: 0 Discharge Condition: Stable Ambulatory Status: Ambulatory Discharge Destination: Home Transportation: Private Auto Accompanied By: spouse Schedule Follow-up Appointment: Yes Medication Reconciliation completed and provided to Patient/Care No Delesha Pohlman: Provided on Clinical Summary of Care: [name] Form Type Recipient Paper Patient PB Electronic Signature(s) Signed: 03/11/2016 2:14:34 PM By: [name] Previous Signature: [name] 2:00:47 PM Version By: Ruthine Dose Entered By: [name] 14:14:34 [name] ([id number]) -------------------------------------------------------------------------------- Multi Wound Chart Details Patient Name: [name], [name] Date of Service: [name] 1:30 PM Medical Record Number: [id number] Patient Account Number: [id number] Date of Birth/Sex: 1945/05/27 (70 y.o. Male) Treating RN: [name] Primary Care Physician: [name] Other Clinician: Referring Physician: Fulton [name] Treating Physician/Extender: [name] in Treatment: 23 Vital Signs Height(in): 68 Pulse(bpm): 70 Weight(lbs): 175 Blood Pressure 136/51 (mmHg): Body Mass Index(BMI): 27 Temperature(F): 98.7 Respiratory Rate 16 (breaths/min): Photos: [2:No Photos] [N/A:N/A] Wound Location: [2:Sacrum] [N/A:N/A] Wounding Event: [2:Gradually Appeared] [N/A:N/A] Primary Etiology: [2:Pressure Ulcer] [N/A:N/A] Comorbid History: [2:Chronic Obstructive Pulmonary Disease (COPD), Arrhythmia, Congestive Heart Failure, Coronary Artery Disease, Hypertension, Peripheral Venous Disease, Osteoarthritis] [N/A:N/A] Date Acquired: [2:06/01/2015] [N/A:N/A] Weeks of Treatment: [2:23] [N/A:N/A] Wound Status: [2:Open] [N/A:N/A] Measurements L x W x D 0.1x0.1x0.1 [N/A:N/A] (cm) Area (cm) : [2:0.008] [N/A:N/A] Volume (cm) : [2:0.001] [N/A:N/A] % Reduction in Area:  [2:99.90%] [N/A:N/A] % Reduction in Volume: 99.90% [N/A:N/A] Classification: [2:Category/Stage III] [N/A:N/A] Exudate Amount: [2:None Present] [N/A:N/A] Wound Margin: [2:Flat and Intact] [N/A:N/A] Granulation Amount: [2:Large (67-100%)] [N/A:N/A] Granulation Quality: [2:Pink] [N/A:N/A] Necrotic Amount: [2:None Present (0%)] [N/A:N/A] Exposed Structures: [2:Fascia: No Fat: No Tendon: No] [N/A:N/A] Muscle: No Joint: No Bone: No Limited to Skin Breakdown Epithelialization: Large (67-100%) N/A N/A Periwound Skin Texture: Scarring: Yes N/A N/A Edema: No Excoriation: No Induration: No Callus: No Crepitus: No Fluctuance: No Friable: No Rash: No Periwound Skin Maceration: No N/A N/A Moisture: Moist: No Dry/Scaly: No Periwound Skin Color:  Atrophie Blanche: No N/A N/A Cyanosis: No Ecchymosis: No Erythema: No Hemosiderin Staining: No Mottled: No Pallor: No Rubor: No Temperature: No Abnormality N/A N/A Tenderness on Yes N/A N/A Palpation: Wound Preparation: Ulcer Cleansing: N/A N/A Rinsed/Irrigated with Saline Topical Anesthetic Applied: Other: lidocaine 4% Treatment Notes Electronic Signature(s) Signed: 03/11/2016 5:05:14 PM By: [name] Entered By: [name] 13:56:53 [name] ([id number]) -------------------------------------------------------------------------------- [place] Details Patient Name: [name], [name] Date of Service: [name] 1:30 PM Medical Record Number: [id number] Patient Account Number: [id number] Date of Birth/Sex: Oct 08, 1945 (70 y.o. Male) Treating RN: [name] Primary Care Physician: [name] Other Clinician: Referring Physician: Fulton [name] Treating Physician/Extender: [name] in Treatment: 60 Active Inactive Electronic Signature(s) Signed: 03/23/2016 11:56:40 AM By: Gretta Cool RN, BSN, Kim RN, BSN Signed: 03/29/2016 5:33:12 PM By: [name] Previous  Signature: [name] 5:05:14 PM Version By: [name] Entered By: Gretta Cool RN, BSN, Kim on 03/23/2016 11:56:40 [name] ([id number]) -------------------------------------------------------------------------------- Pain Assessment Details Patient Name: [name], [name] Date of Service: [name] 1:30 PM Medical Record Number: [id number] Patient Account Number: [id number] Date of Birth/Sex: 1945-09-08 (70 y.o. Male) Treating RN: [name] Primary Care Physician: [name] Other Clinician: Referring Physician: Fulton [name] Treating Physician/Extender: [name] in Treatment: 41 Active Problems Location of Pain Severity and Description of Pain Patient Has Paino Yes Site Locations Pain Location: Pain in Ulcers With Dressing Change: Yes Duration of the Pain. Constant / Intermittento Intermittent Rate the pain. Current Pain Level: 3 Pain Management and Medication Current Pain Management: Notes Topical or injectable lidocaine is offered to patient for acute pain when surgical debridement is performed. If needed, Patient is instructed to use over the counter pain medication for the following 24-48 hours after debridement. Wound care MDs do not prescribed pain medications. Patient has chronic pain or uncontrolled pain. Patient has been instructed to make an appointment with their Primary Care Physician for pain management. Electronic Signature(s) Signed: 03/11/2016 5:05:14 PM By: [name] Entered By: [name] 13:33:53 [name] ([id number]) -------------------------------------------------------------------------------- Patient/Caregiver Education Details Patient Name: [name], Advit B. Date of Service: [name] 1:30 PM Medical Record Number: [id number] Patient Account Number: [id number] Date of Birth/Gender: 04-22-1946 (70 y.o. Male) Treating RN: [name] Primary Care Physician: [name] Other Clinician: Referring Physician: Fulton [name] Treating Physician/Extender: [name] in Treatment: 37 Education Assessment Education Provided To: Patient and Caregiver Education Topics Provided Wound/Skin Impairment: Handouts: Other: wound care as ordered Methods: Demonstration, Explain/Verbal Responses: State content correctly Electronic Signature(s) Signed: 03/11/2016 5:05:14 PM By: [name] Entered By: [name] 14:15:02 [name] ([id number]) -------------------------------------------------------------------------------- Wound Assessment Details Patient Name: [name], [name] Date of Service: [name] 1:30 PM Medical Record Number: [id number] Patient Account Number: [id number] Date of Birth/Sex: 04/15/1946 (70 y.o. Male) Treating RN: [name] Primary Care Physician: [name] Other Clinician: Referring Physician: Fulton [name] Treating Physician/Extender: [name] in Treatment: 23 Wound Status Wound Number: 2 Primary Pressure Ulcer Etiology: Wound Location: Sacrum Wound Open Wounding Event: Gradually Appeared Status: Date Acquired: 06/01/2015 Comorbid Chronic Obstructive Pulmonary Disease Weeks Of Treatment: 23 History: (COPD), Arrhythmia, Congestive Heart Clustered Wound: No Failure, Coronary Artery Disease, Hypertension, Peripheral Venous Disease, Osteoarthritis Photos Wound Measurements Length: (cm) 0.1 Width: (cm) 0.1 Depth: (cm) 0.1 Area: (cm) 0.008 Volume: (cm) 0.001 % Reduction in Area: 99.9% % Reduction in Volume: 99.9% Epithelialization: Large (67-100%) Tunneling: No Undermining: No Wound Description Classification: Category/Stage III Wound Margin: Flat and Intact  Exudate Amount: None Present Foul Odor After Cleansing: No Wound Bed Granulation Amount: Large (67-100%) Exposed Structure Granulation Quality: Pink Fascia Exposed: No Necrotic Amount:  None Present (0%) Fat Layer Exposed: No Tendon Exposed: No Muscle Exposed: No Gruhn, [name] ([id number]) Joint Exposed: No Bone Exposed: No Limited to Skin Breakdown Periwound Skin Texture Texture Color No Abnormalities Noted: No No Abnormalities Noted: No Callus: No Atrophie Blanche: No Crepitus: No Cyanosis: No Excoriation: No Ecchymosis: No Fluctuance: No Erythema: No Friable: No Hemosiderin Staining: No Induration: No Mottled: No Localized Edema: No Pallor: No Rash: No Rubor: No Scarring: Yes Temperature / Pain Moisture Temperature: No Abnormality No Abnormalities Noted: No Tenderness on Palpation: Yes Dry / Scaly: No Maceration: No Moist: No Wound Preparation Ulcer Cleansing: Rinsed/Irrigated with Saline Topical Anesthetic Applied: Other: lidocaine 4%, Electronic Signature(s) Signed: 03/11/2016 5:05:14 PM By: [name] Entered By: [name] 15:58:31 [name] ([id number]) -------------------------------------------------------------------------------- Vitals Details Patient Name: [name], [name] Date of Service: [name] 1:30 PM Medical Record Number: [id number] Patient Account Number: [id number] Date of Birth/Sex: 01-Jun-1945 (70 y.o. Male) Treating RN: [name] Primary Care Physician: [name] Other Clinician: Referring Physician: Fulton [name] Treating Physician/Extender: [name] in Treatment: 23 Vital Signs Time Taken: 13:33 Temperature (F): 98.7 Height (in): 68 Pulse (bpm): 70 Weight (lbs): 175 Respiratory Rate (breaths/min): 16 Body Mass Index (BMI): 26.6 Blood Pressure (mmHg): 136/51 Reference Range: 80 - 120 mg / dl Electronic Signature(s) Signed: 03/11/2016 5:05:14 PM By: [name] Entered By: [name] 13:35:48

## 2016-03-12 NOTE — Progress Notes (Signed)
[name] ([id number]) Visit Report for 03/11/2016 Chief Complaint Document Details Patient Name: [name], [name] Date of Service: 03/11/2016 1:30 PM Medical Record Number: [id number] Patient Account Number: [id number] Date of Birth/Sex: 19-Jun-1945 (70 y.o. Male) Treating RN: Montey Hora Primary Care Physician: [name] Other Clinician: Referring Physician: Fulton [name] Treating Physician/Extender: Frann Rider in Treatment: 90 Information Obtained from: Patient Chief Complaint Patient presents to the wound care center for a consult due non healing wound for about 4 months now Electronic Signature(s) Signed: 03/11/2016 2:10:59 PM By: Christin Fudge MD, FACS Entered By: Christin Fudge on 03/11/2016 14:10:59 [name] ([id number]) -------------------------------------------------------------------------------- HPI Details Patient Name: [name], [name] Date of Service: 03/11/2016 1:30 PM Medical Record Number: [id number] Patient Account Number: [id number] Date of Birth/Sex: 1945-11-20 (70 y.o. Male) Treating RN: Montey Hora Primary Care Physician: [name] Other Clinician: Referring Physician: Fulton [name] Treating Physician/Extender: Frann Rider in Treatment: 23 History of Present Illness Location: ulcerated areas in the region of the sacrum bilaterally Quality: Patient reports experiencing a shooting pain to affected area(s). Severity: Patient states wound are getting worse. Duration: Patient has had the wound for > 3 months prior to seeking treatment at the wound center Timing: Pain in wound is constant (hurts all the time) Context: The wound would happen gradually Modifying Factors: Other treatment(s) tried include:has been treated by his PCP and has already been on chronic pain medications Associated Signs and Symptoms: Patient reports having difficulty standing for long periods. HPI Description: 70 year old  gentleman who was referred to [place] by his PCP Dr. Fulton [name] for decubitus ulcers in the region of the sacrum which are very painful and these had them for about 4 months now. The patient's past medical history includes aortic stenosis, atrial fibrillation, coronary artery disease, CHF, COPD, coronary artery disease, prediabetes, peripheral vascular disease, nicotine addiction. In the past he's had carotid artery angioplasty and coronary artery bypass graft and VSD repair. He now has a chronic hip condition which is causing him a lot of pain and he goes to a pain clinic for a long while for this. He's been a smoker for over 40 years and smokes about a pack of cigarettes a day. His last hemoglobin A1c done in February of this year was 5.7. 10/06/2015 -- he continues to have a lot of pain around his decubitus ulcers and other than that has been trying his best to offload. He is also working on his smoking cessation. 01/12/2016 -- he has not been here for 3 weeks due to other issues and has been doing his dressing as advised. Electronic Signature(s) Signed: 03/11/2016 2:11:02 PM By: Christin Fudge MD, FACS Entered By: Christin Fudge on 03/11/2016 14:11:02 [name] ([id number]) -------------------------------------------------------------------------------- Physical Exam Details Patient Name: [name], [name] Date of Service: 03/11/2016 1:30 PM Medical Record Number: [id number] Patient Account Number: [id number] Date of Birth/Sex: 02/23/1946 (70 y.o. Male) Treating RN: Montey Hora Primary Care Physician: [name] Other Clinician: Referring Physician: Fulton [name] Treating Physician/Extender: Frann Rider in Treatment: 23 Constitutional . Pulse regular. Respirations normal and unlabored. Afebrile. . Eyes Nonicteric. Reactive to light. Ears, Nose, Mouth, and Throat Lips, teeth, and gums WNL.Marland Kitchen Moist mucosa without lesions. Neck supple and nontender. No  palpable supraclavicular or cervical adenopathy. Normal sized without goiter. Respiratory WNL. No retractions.. Breath sounds WNL, No rubs, rales, rhonchi, or wheeze.. Cardiovascular Heart rhythm and rate regular, no murmur or gallop.. Pedal Pulses WNL. No clubbing, cyanosis or edema. Chest Breasts symmetical and  no nipple discharge.. Breast tissue WNL, no masses, lumps, or tenderness.. Lymphatic No adneopathy. No adenopathy. No adenopathy. Musculoskeletal Adexa without tenderness or enlargement.. Digits and nails w/o clubbing, cyanosis, infection, petechiae, ischemia, or inflammatory conditions.. Integumentary (Hair, Skin) No suspicious lesions. No crepitus or fluctuance. No peri-wound warmth or erythema. No masses.Marland Kitchen Psychiatric Judgement and insight Intact.. No evidence of depression, anxiety, or agitation.. Notes there is good resolution of his problem and there is healthy epithelization and the dimple in the area persists but I think the wound is healed. Electronic Signature(s) Signed: 03/11/2016 2:11:32 PM By: Christin Fudge MD, FACS Entered By: Christin Fudge on 03/11/2016 14:11:31 [name] ([id number]) -------------------------------------------------------------------------------- Physician Orders Details Patient Name: [name], [name] Date of Service: 03/11/2016 1:30 PM Medical Record Number: [id number] Patient Account Number: [id number] Date of Birth/Sex: Jun 18, 1945 (70 y.o. Male) Treating RN: Montey Hora Primary Care Physician: [name] Other Clinician: Referring Physician: Fulton [name] Treating Physician/Extender: Frann Rider in Treatment: 23 Verbal / Phone Orders: Yes Clinician: Montey Hora Read Back and Verified: Yes Diagnosis Coding Wound Cleansing Wound #2 Sacrum o Clean wound with Normal Saline. o Cleanse wound with mild soap and water o May Shower, gently pat wound dry prior to applying new dressing. Skin  Barriers/Peri-Wound Care Wound #2 Sacrum o Skin Prep Secondary Dressing Wound #2 Sacrum o Boardered Foam Dressing Dressing Change Frequency Wound #2 Sacrum o Other: - every 2-4 days Follow-up Appointments Wound #2 Sacrum o Other: - if needed Off-Loading Wound #2 Sacrum o Turn and reposition every 2 hours Additional Orders / Instructions Wound #2 Sacrum o Stop Smoking o Increase protein intake. Medications-please add to medication list. Wound #2 Sacrum o Other: - Vitamin C, Zinc, Multivitamins [name], [name] Tymon B. ([id number]) Electronic Signature(s) Signed: 03/11/2016 4:11:06 PM By: Christin Fudge MD, FACS Signed: 03/11/2016 5:05:14 PM By: Montey Hora Entered By: Montey Hora on 03/11/2016 13:57:51 [name] ([id number]) -------------------------------------------------------------------------------- Problem List Details Patient Name: [name], [name] Date of Service: 03/11/2016 1:30 PM Medical Record Number: [id number] Patient Account Number: [id number] Date of Birth/Sex: 1945/12/27 (70 y.o. Male) Treating RN: Montey Hora Primary Care Physician: [name] Other Clinician: Referring Physician: Fulton [name] Treating Physician/Extender: Frann Rider in Treatment: 34 Active Problems ICD-10 Encounter Code Description Active Date Diagnosis L89.153 Pressure ulcer of sacral region, stage 3 09/29/2015 Yes F17.218 Nicotine dependence, cigarettes, with other nicotine- 09/29/2015 Yes induced disorders G89.4 Chronic pain syndrome 09/29/2015 Yes Inactive Problems Resolved Problems ICD-10 Code Description Active Date Resolved Date L89.313 Pressure ulcer of right buttock, stage 3 10/20/2015 10/20/2015 Electronic Signature(s) Signed: 03/11/2016 2:10:49 PM By: Christin Fudge MD, FACS Entered By: Christin Fudge on 03/11/2016 14:10:48 [name]  ([id number]) -------------------------------------------------------------------------------- Progress Note Details Patient Name: [name], [name] Date of Service: 03/11/2016 1:30 PM Medical Record Number: [id number] Patient Account Number: [id number] Date of Birth/Sex: [birth year] (70 y.o. Male) Treating RN: Montey Hora Primary Care Physician: [name] Other Clinician: Referring Physician: Fulton [name] Treating Physician/Extender: Frann Rider in Treatment: 81 Subjective Chief Complaint Information obtained from Patient Patient presents to the wound care center for a consult due non healing wound for about 4 months now History of Present Illness (HPI) The following HPI elements were documented for the patient's wound: Location: ulcerated areas in the region of the sacrum bilaterally Quality: Patient reports experiencing a shooting pain to affected area(s). Severity: Patient states wound are getting worse. Duration: Patient has had the wound for > 3 months prior to seeking treatment at the wound center Timing:  Pain in wound is constant (hurts all the time) Context: The wound would happen gradually Modifying Factors: Other treatment(s) tried include:has been treated by his PCP and has already been on chronic pain medications Associated Signs and Symptoms: Patient reports having difficulty standing for long periods. 70 year old gentleman who was referred to [place] by his PCP Dr. Fulton [name] for decubitus ulcers in the region of the sacrum which are very painful and these had them for about 4 months now. The patient's past medical history includes aortic stenosis, atrial fibrillation, coronary artery disease, CHF, COPD, coronary artery disease, prediabetes, peripheral vascular disease, nicotine addiction. In the past he's had carotid artery angioplasty and coronary artery bypass graft and VSD repair. He now has a chronic hip condition which is causing him a lot of  pain and he goes to a pain clinic for a long while for this. He's been a smoker for over 40 years and smokes about a pack of cigarettes a day. His last hemoglobin A1c done in February of this year was 5.7. 10/06/2015 -- he continues to have a lot of pain around his decubitus ulcers and other than that has been trying his best to offload. He is also working on his smoking cessation. 01/12/2016 -- he has not been here for 3 weeks due to other issues and has been doing his dressing as advised. Objective Constitutional Pulse regular. Respirations normal and unlabored. Afebrile. [name], [name] ([id number]) Vitals Time Taken: 1:33 PM, Height: 68 in, Weight: 175 lbs, BMI: 26.6, Temperature: 98.7 F, Pulse: 70 bpm, Respiratory Rate: 16 breaths/min, Blood Pressure: 136/51 mmHg. Eyes Nonicteric. Reactive to light. Ears, Nose, Mouth, and Throat Lips, teeth, and gums WNL.Marland Kitchen Moist mucosa without lesions. Neck supple and nontender. No palpable supraclavicular or cervical adenopathy. Normal sized without goiter. Respiratory WNL. No retractions.. Breath sounds WNL, No rubs, rales, rhonchi, or wheeze.. Cardiovascular Heart rhythm and rate regular, no murmur or gallop.. Pedal Pulses WNL. No clubbing, cyanosis or edema. Chest Breasts symmetical and no nipple discharge.. Breast tissue WNL, no masses, lumps, or tenderness.. Lymphatic No adneopathy. No adenopathy. No adenopathy. Musculoskeletal Adexa without tenderness or enlargement.. Digits and nails w/o clubbing, cyanosis, infection, petechiae, ischemia, or inflammatory conditions.Marland Kitchen Psychiatric Judgement and insight Intact.. No evidence of depression, anxiety, or agitation.. General Notes: there is good resolution of his problem and there is healthy epithelization and the dimple in the area persists but I think the wound is healed. Integumentary (Hair, Skin) No suspicious lesions. No crepitus or fluctuance. No peri-wound warmth or erythema. No  masses.. Wound #2 status is Open. Original cause of wound was Gradually Appeared. The wound is located on the Sacrum. The wound measures 0.1cm length x 0.1cm width x 0.1cm depth; 0.008cm^2 area and 0.001cm^3 volume. The wound is limited to skin breakdown. There is no tunneling or undermining noted. There is a none present amount of drainage noted. The wound margin is flat and intact. There is large (67-100%) pink granulation within the wound bed. There is no necrotic tissue within the wound bed. The periwound skin appearance exhibited: Scarring. The periwound skin appearance did not exhibit: Callus, Crepitus, Excoriation, Fluctuance, Friable, Induration, Localized Edema, Rash, Dry/Scaly, Maceration, Moist, Atrophie Blanche, Cyanosis, Ecchymosis, Hemosiderin Staining, Mottled, Pallor, Rubor, Erythema. Periwound temperature was noted as No Abnormality. The periwound has tenderness on palpation. [name], [name] ([id number]) Assessment Active Problems ICD-10 L89.153 - Pressure ulcer of sacral region, stage 3 F17.218 - Nicotine dependence, cigarettes, with other nicotine-induced disorders G89.4 - Chronic pain syndrome Plan  Wound Cleansing: Wound #2 Sacrum: Clean wound with Normal Saline. Cleanse wound with mild soap and water May Shower, gently pat wound dry prior to applying new dressing. Skin Barriers/Peri-Wound Care: Wound #2 Sacrum: Skin Prep Secondary Dressing: Wound #2 Sacrum: Boardered Foam Dressing Dressing Change Frequency: Wound #2 Sacrum: Other: - every 2-4 days Follow-up Appointments: Wound #2 Sacrum: Other: - if needed Off-Loading: Wound #2 Sacrum: Turn and reposition every 2 hours Additional Orders / Instructions: Wound #2 Sacrum: Stop Smoking Increase protein intake. Medications-please add to medication list.: Wound #2 Sacrum: Other: - Vitamin C, Zinc, Multivitamins [name], [name] ([id number]) The area has completely healed and there is healthy  epithelization at this stage I'm asked him to apply a bordered foam and observe this area carefully. He will return if there continues to be drainage but if not we will consider him healed. Electronic Signature(s) Signed: 03/11/2016 4:13:35 PM By: Christin Fudge MD, FACS Previous Signature: 03/11/2016 2:14:03 PM Version By: Christin Fudge MD, FACS Entered By: Christin Fudge on 03/11/2016 16:13:35 [name] ([id number]) -------------------------------------------------------------------------------- SuperBill Details Patient Name: [name], [name] Date of Service: 03/11/2016 Medical Record Number: [id number] Patient Account Number: [id number] Date of Birth/Sex: 20-Jul-1945 (70 y.o. Male) Treating RN: Montey Hora Primary Care Physician: [name] Other Clinician: Referring Physician: Fulton [name] Treating Physician/Extender: Frann Rider in Treatment: 23 Diagnosis Coding ICD-10 Codes Code Description [phone] Pressure ulcer of sacral region, stage 3 F17.218 Nicotine dependence, cigarettes, with other nicotine-induced disorders G89.4 Chronic pain syndrome Facility Procedures CPT4 Code: [id number] Description: [phone] - WOUND CARE VISIT-LEV 2 EST PT Modifier: Quantity: 1 Physician Procedures CPT4 Code Description: [id number] - WC PHYS LEVEL 3 - EST PT ICD-10 Description Diagnosis L89.153 Pressure ulcer of sacral region, stage 3 F17.218 Nicotine dependence, cigarettes, with other nicoti G89.4 Chronic pain syndrome Modifier: ne-induced dis Quantity: 1 orders Electronic Signature(s) Signed: 03/11/2016 2:14:18 PM By: Christin Fudge MD, FACS Entered By: Christin Fudge on 03/11/2016 14:14:17

## 2016-03-15 ENCOUNTER — Encounter: Payer: Self-pay | Admitting: Anesthesiology

## 2016-03-15 ENCOUNTER — Ambulatory Visit: Payer: Medicare Other | Attending: Anesthesiology | Admitting: Anesthesiology

## 2016-03-15 VITALS — BP 115/67 | HR 109 | Temp 98.4°F | Ht 67.0 in | Wt 169.0 lb

## 2016-03-15 DIAGNOSIS — M4696 Unspecified inflammatory spondylopathy, lumbar region: Secondary | ICD-10-CM

## 2016-03-15 DIAGNOSIS — M5441 Lumbago with sciatica, right side: Secondary | ICD-10-CM | POA: Diagnosis not present

## 2016-03-15 DIAGNOSIS — I739 Peripheral vascular disease, unspecified: Secondary | ICD-10-CM | POA: Diagnosis not present

## 2016-03-15 DIAGNOSIS — Z880 Allergy status to penicillin: Secondary | ICD-10-CM | POA: Diagnosis not present

## 2016-03-15 DIAGNOSIS — M5136 Other intervertebral disc degeneration, lumbar region: Secondary | ICD-10-CM | POA: Diagnosis not present

## 2016-03-15 DIAGNOSIS — M47816 Spondylosis without myelopathy or radiculopathy, lumbar region: Secondary | ICD-10-CM

## 2016-03-15 DIAGNOSIS — M791 Myalgia: Secondary | ICD-10-CM | POA: Insufficient documentation

## 2016-03-15 DIAGNOSIS — L89109 Pressure ulcer of unspecified part of back, unspecified stage: Secondary | ICD-10-CM | POA: Insufficient documentation

## 2016-03-15 DIAGNOSIS — M545 Low back pain: Secondary | ICD-10-CM | POA: Insufficient documentation

## 2016-03-15 DIAGNOSIS — Z79891 Long term (current) use of opiate analgesic: Secondary | ICD-10-CM | POA: Insufficient documentation

## 2016-03-15 DIAGNOSIS — I350 Nonrheumatic aortic (valve) stenosis: Secondary | ICD-10-CM | POA: Diagnosis not present

## 2016-03-15 DIAGNOSIS — L8991 Pressure ulcer of unspecified site, stage 1: Secondary | ICD-10-CM | POA: Insufficient documentation

## 2016-03-15 DIAGNOSIS — M7918 Myalgia, other site: Secondary | ICD-10-CM

## 2016-03-15 DIAGNOSIS — G8929 Other chronic pain: Secondary | ICD-10-CM | POA: Diagnosis present

## 2016-03-15 MED ORDER — FENTANYL 100 MCG/HR TD PT72: 100.0000 ug | MEDICATED_PATCH | TRANSDERMAL | 0 refills | Status: DC

## 2016-03-15 NOTE — Patient Instructions (Signed)
You were given 2 prescriptions for Duragesic patch.

## 2016-03-16 NOTE — Progress Notes (Signed)
Chief complaint is low back pain  Procedure: none  History of present illness: [name] comes in today for a 2 month follow-up. He's been taking his medication as prescribed and doing well with this regimen. No other changes are noted. His strength and lower extremity function are stable and the quality characteristic and distribution of pain once again have remained stable. Otherwise he is in his usual state of health. He continues to follow-up with his other physicians regarding his gluteal ulcerations and these have healed well. Furthermore he continues to follow-up with Dr. Doy Hutching as his primary care physician with no new changes in his medical status mentioned today.     BP 115/67   Pulse (!) 109   Temp 98.4 F (36.9 C)   Ht 5\' 7"  (1.702 m)   Wt 169 lb (76.7 kg)   BMI 26.47 kg/m    Current Outpatient Prescriptions:  .  aspirin 325 MG tablet, Take 325 mg by mouth daily., Disp: , Rfl:  .  atorvastatin (LIPITOR) 10 MG tablet, Take by mouth., Disp: , Rfl:  .  busPIRone (BUSPAR) 15 MG tablet, Take 15 mg by mouth 3 (three) times daily. , Disp: , Rfl:  .  butalbital-aspirin-caffeine (FIORINAL) 50-325-40 MG capsule, Take 1 capsule by mouth 2 (two) times daily as needed for headache., Disp: , Rfl:  .  chlorpheniramine-HYDROcodone (TUSSIONEX) 10-8 MG/5ML SUER, Take by mouth., Disp: , Rfl:  .  citalopram (CELEXA) 10 MG tablet, Take 10 mg by mouth daily., Disp: , Rfl:  .  diazepam (VALIUM) 5 MG tablet, Take by mouth., Disp: , Rfl:  .  docusate sodium (COLACE) 100 MG capsule, Take 100 mg by mouth 2 (two) times daily., Disp: , Rfl:  .  fentaNYL (DURAGESIC - DOSED MCG/HR) 100 MCG/HR, Place 1 patch (100 mcg total) onto the skin every other day., Disp: 15 patch, Rfl: 0 .  gemfibrozil (LOPID) 600 MG tablet, Take 600 mg by mouth 2 (two) times daily before a meal., Disp: , Rfl:  .  lidocaine (LIDOCAINE PAK) 5 % ointment, Apply 1 application topically as needed., Disp: , Rfl:  .  lisinopril  (PRINIVIL,ZESTRIL) 10 MG tablet, Take 10 mg by mouth daily., Disp: , Rfl:  .  metoprolol tartrate (LOPRESSOR) 25 MG tablet, Take 25 mg by mouth daily., Disp: , Rfl:  .  Oxycodone HCl 10 MG TABS, Take by mouth., Disp: , Rfl:  .  tamsulosin (FLOMAX) 0.4 MG CAPS capsule, Take 0.4 mg by mouth daily., Disp: , Rfl:  .  aspirin (GOODSENSE ASPIRIN) 325 MG tablet, Take by mouth., Disp: , Rfl:  .  atorvastatin (LIPITOR) 10 MG tablet, , Disp: , Rfl: 0 .  busPIRone (BUSPAR) 10 MG tablet, Take 10 mg by mouth 3 (three) times daily. Reported on 11/11/2015, Disp: , Rfl:  .  busPIRone (BUSPAR) 15 MG tablet, Take by mouth., Disp: , Rfl:  .  butalbital-acetaminophen-caffeine (FIORICET, ESGIC) 50-325-40 MG per tablet, Take 1 tablet by mouth 2 (two) times daily as needed for headache. Reported on 11/11/2015, Disp: , Rfl:  .  butalbital-aspirin-caffeine (FIORINAL) 50-325-40 MG capsule, Take by mouth., Disp: , Rfl:  .  citalopram (CELEXA) 20 MG tablet, , Disp: , Rfl: 0 .  clotrimazole-betamethasone (LOTRISONE) cream, Apply 1 application topically 2 (two) times daily. Reported on 11/11/2015, Disp: , Rfl:  .  clotrimazole-betamethasone (LOTRISONE) cream, Apply topically., Disp: , Rfl:  .  docusate sodium (STOOL SOFTENER) 100 MG capsule, Take by mouth., Disp: , Rfl:  .  fentaNYL (DURAGESIC - DOSED MCG/HR) 100 MCG/HR, Place onto the skin., Disp: , Rfl:  .  gemfibrozil (LOPID) 600 MG tablet, Take by mouth., Disp: , Rfl:  .  lidocaine (XYLOCAINE) 5 % ointment, as directed. , Disp: , Rfl:  .  lisinopril (PRINIVIL,ZESTRIL) 10 MG tablet, Take by mouth., Disp: , Rfl:  .  meperidine (DEMEROL) 50 MG tablet, Take 1 tablet (50 mg total) by mouth 1 day or 1 dose. (Patient not taking: Reported on 03/15/2016), Disp: 30 tablet, Rfl: 0 .  metoprolol tartrate (LOPRESSOR) 25 MG tablet, Take by mouth., Disp: , Rfl:  .  Oxycodone HCl 10 MG TABS, Take by mouth. Prn, does not take every day, Disp: , Rfl:  .  tamsulosin (FLOMAX) 0.4 MG CAPS  capsule, Take by mouth., Disp: , Rfl:  .  VALIUM 5 MG tablet, , Disp: , Rfl: 0  Patient Active Problem List   Diagnosis Date Noted  . Stage I pressure ulcer of back 11/03/2015  . 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 DDD (degenerative disc disease), lumbar 09/26/2014  . Myofacial muscle pain 09/26/2014    Allergies  Allergen Reactions  . Celebrex [Celecoxib] Other (See Comments) and Shortness Of Breath     CHEST PAIN Chest pain  . Carvedilol Other (See Comments)    Extreme fatigue  . Penicillins Other (See Comments) and Rash    unknown    Physical exam pupils are equally round and reactive to light  Extraocular muscles are intact   Heart is regular rate and rhythm and lower extremity strength and function remains a baseline with no significant changes noted.  His right leg strength compared to left slightly diminished which is his baseline. Otherwise no change on examination noted  Assessment  #1 chronic low back pain  #2 chronic opioid management   #66facet arthropathy #4 peripheral vascular disease and history of aortic stenosis with valve repair  Plan: We'll refill medications at present with return to clinic in the next 2 months for reevaluation. He  is to continue with physical therapy exercises and aerobic conditioning as tolerated and continue follow-up with his primary care physician, Dr. Doy Hutching. Should he have any changes in his symptom quality characteristic and distribution or strength he has been instructed to contact us for further evaluation. At this point he seems to be doing quite well with his regimen without any significant side effects noted with the Duragesic patch and overall good pain control for his symptom complex.  Dr. Vashti Hey 6:53 PM

## 2016-03-24 LAB — TOXASSURE SELECT 13 (MW), URINE

## 2016-05-11 ENCOUNTER — Encounter: Payer: Self-pay | Admitting: Anesthesiology

## 2016-05-11 ENCOUNTER — Ambulatory Visit: Payer: Medicare Other | Attending: Anesthesiology | Admitting: Anesthesiology

## 2016-05-11 VITALS — BP 121/63 | HR 70 | Temp 98.6°F | Resp 16 | Ht 68.0 in | Wt 180.0 lb

## 2016-05-11 DIAGNOSIS — G8929 Other chronic pain: Secondary | ICD-10-CM | POA: Diagnosis not present

## 2016-05-11 DIAGNOSIS — L89109 Pressure ulcer of unspecified part of back, unspecified stage: Secondary | ICD-10-CM | POA: Insufficient documentation

## 2016-05-11 DIAGNOSIS — Z7982 Long term (current) use of aspirin: Secondary | ICD-10-CM | POA: Insufficient documentation

## 2016-05-11 DIAGNOSIS — I350 Nonrheumatic aortic (valve) stenosis: Secondary | ICD-10-CM | POA: Diagnosis not present

## 2016-05-11 DIAGNOSIS — I739 Peripheral vascular disease, unspecified: Secondary | ICD-10-CM | POA: Diagnosis not present

## 2016-05-11 DIAGNOSIS — Z888 Allergy status to other drugs, medicaments and biological substances status: Secondary | ICD-10-CM | POA: Insufficient documentation

## 2016-05-11 DIAGNOSIS — R51 Headache: Secondary | ICD-10-CM | POA: Diagnosis not present

## 2016-05-11 DIAGNOSIS — M5136 Other intervertebral disc degeneration, lumbar region: Secondary | ICD-10-CM | POA: Insufficient documentation

## 2016-05-11 DIAGNOSIS — Z880 Allergy status to penicillin: Secondary | ICD-10-CM | POA: Diagnosis not present

## 2016-05-11 DIAGNOSIS — Z79891 Long term (current) use of opiate analgesic: Secondary | ICD-10-CM | POA: Insufficient documentation

## 2016-05-11 DIAGNOSIS — M5441 Lumbago with sciatica, right side: Secondary | ICD-10-CM

## 2016-05-11 DIAGNOSIS — M47816 Spondylosis without myelopathy or radiculopathy, lumbar region: Secondary | ICD-10-CM

## 2016-05-11 DIAGNOSIS — M4696 Unspecified inflammatory spondylopathy, lumbar region: Secondary | ICD-10-CM | POA: Diagnosis not present

## 2016-05-11 MED ORDER — FENTANYL 100 MCG/HR TD PT72: 100.0000 ug | MEDICATED_PATCH | TRANSDERMAL | 0 refills | Status: DC

## 2016-05-11 NOTE — Progress Notes (Signed)
Safety precautions to be maintained throughout the outpatient stay will include: orient to surroundings, keep bed in low position, maintain call bell within reach at all times, provide assistance with transfer out of bed and ambulation.  

## 2016-05-12 NOTE — Progress Notes (Signed)
Chief complaint is low back pain  Procedure: none  History of present illness: [name] presents for reevaluation. [name] maintains that his low back pain and thoracic pain and knee pain and hip pain are at baseline. He still taking Duragesic 100) shows about every 2 days and tolerating this without difficulty. I reviewed his narcotic assessment sheet with him and he feels that overall his pain is well controlled with the medications and much better than without. He states that his overall lifestyle and function are improved. He does have occasional constipation but no other problems with the medications.  I have also reviewed the practitioner database information and there is evidence that he has received opioids from other sources namely Dr. Georgie [name] I spoke with Dr. Doy [name] about this preceding my encounter with [name] today. As discussed with Dr. Doy [name] we're going to eliminate further opioid medications from Dr. Doy [name] for breakthrough pain to consolidate this. Also, Dr. Doy [name] is planning on working to reduce the amount of purulent L that [name] is requiring for his headache management.  I do not see evidence of diverting use.    BP 121/63 (BP Location: Right Arm, Patient Position: Sitting, Cuff Size: Normal)   Pulse 70   Temp 98.6 F (37 C) (Oral)   Resp 16   Ht 5\' 8"  (1.727 m)   Wt 180 lb (81.6 kg)   SpO2 100%   BMI 27.37 kg/m    Current Outpatient Prescriptions:  .  aspirin (GOODSENSE ASPIRIN) 325 MG tablet, Take by mouth., Disp: , Rfl:  .  atorvastatin (LIPITOR) 10 MG tablet, Take by mouth., Disp: , Rfl:  .  busPIRone (BUSPAR) 10 MG tablet, Take 10 mg by mouth 3 (three) times daily. Reported on 11/11/2015, Disp: , Rfl:  .  butalbital-acetaminophen-caffeine (FIORICET, ESGIC) 50-325-40 MG per tablet, Take 1 tablet by mouth 2 (two) times daily as needed for headache. Reported on 11/11/2015, Disp: , Rfl:  .  butalbital-aspirin-caffeine (FIORINAL) 50-325-40 MG capsule,  Take 1 capsule by mouth 2 (two) times daily as needed for headache., Disp: , Rfl:  .  butalbital-aspirin-caffeine (FIORINAL) 50-325-40 MG capsule, Take by mouth., Disp: , Rfl:  .  citalopram (CELEXA) 10 MG tablet, Take 10 mg by mouth daily., Disp: , Rfl:  .  clotrimazole-betamethasone (LOTRISONE) cream, Apply 1 application topically 2 (two) times daily. Reported on 11/11/2015, Disp: , Rfl:  .  diazepam (VALIUM) 5 MG tablet, Take by mouth., Disp: , Rfl:  .  docusate sodium (COLACE) 100 MG capsule, Take 100 mg by mouth 2 (two) times daily., Disp: , Rfl:  .  fentaNYL (DURAGESIC - DOSED MCG/HR) 100 MCG/HR, Place onto the skin., Disp: , Rfl:  .  fentaNYL (DURAGESIC - DOSED MCG/HR) 100 MCG/HR, Place 1 patch (100 mcg total) onto the skin every other day., Disp: 15 patch, Rfl: 0 .  gemfibrozil (LOPID) 600 MG tablet, Take 600 mg by mouth 2 (two) times daily before a meal., Disp: , Rfl:  .  lidocaine (LIDOCAINE PAK) 5 % ointment, Apply 1 application topically as needed., Disp: , Rfl:  .  lidocaine (XYLOCAINE) 5 % ointment, as directed. , Disp: , Rfl:  .  lisinopril (PRINIVIL,ZESTRIL) 10 MG tablet, Take 10 mg by mouth daily., Disp: , Rfl:  .  metoprolol tartrate (LOPRESSOR) 25 MG tablet, Take 25 mg by mouth daily., Disp: , Rfl:  .  Oxycodone HCl 10 MG TABS, Take by mouth. Prn, does not take every day, Disp: , Rfl:  .  tamsulosin (FLOMAX) 0.4 MG CAPS capsule, Take 0.4 mg by mouth daily., Disp: , Rfl:  .  aspirin 325 MG tablet, Take 325 mg by mouth daily., Disp: , Rfl:  .  aspirin 325 MG tablet, Take by mouth., Disp: , Rfl:  .  atorvastatin (LIPITOR) 10 MG tablet, , Disp: , Rfl: 0 .  busPIRone (BUSPAR) 15 MG tablet, Take 15 mg by mouth 3 (three) times daily. , Disp: , Rfl:  .  busPIRone (BUSPAR) 15 MG tablet, Take by mouth., Disp: , Rfl:  .  citalopram (CELEXA) 20 MG tablet, , Disp: , Rfl: 0 .  clotrimazole-betamethasone (LOTRISONE) cream, Apply topically., Disp: , Rfl:  .  docusate sodium (STOOL SOFTENER)  100 MG capsule, Take by mouth., Disp: , Rfl:  .  gemfibrozil (LOPID) 600 MG tablet, Take by mouth., Disp: , Rfl:  .  lisinopril (PRINIVIL,ZESTRIL) 10 MG tablet, Take by mouth., Disp: , Rfl:  .  meperidine (DEMEROL) 50 MG tablet, Take 1 tablet (50 mg total) by mouth 1 day or 1 dose. (Patient not taking: Reported on 05/11/2016), Disp: 30 tablet, Rfl: 0 .  metoprolol tartrate (LOPRESSOR) 25 MG tablet, Take by mouth., Disp: , Rfl:  .  tamsulosin (FLOMAX) 0.4 MG CAPS capsule, Take by mouth., Disp: , Rfl:  .  VALIUM 5 MG tablet, , Disp: , Rfl: 0  Patient Active Problem List   Diagnosis Date Noted  . Stage I pressure ulcer of back 11/03/2015  . Facet arthritis of lumbar region ([place]) 09/26/2014  . DDD (degenerative disc disease), lumbar 09/26/2014  . Myofacial muscle pain 09/26/2014    Allergies  Allergen Reactions  . Celebrex [Celecoxib] Other (See Comments) and Shortness Of Breath     CHEST PAIN Chest pain  . Carvedilol Other (See Comments)    Extreme fatigue  . Penicillins Other (See Comments) and Rash    unknown    Physical exam pupils are equally round and reactive to light  Extraocular muscles are intact   Heart is regular rate and rhythm and lower extremity strength and function remains a baseline with no significant changes noted.  His right leg strength compared to left slightly diminished which is his baseline. He walks with a slightly antalgic gait but otherwise no changes are noted once again on strength examination.  Assessment  #1 chronic low back pain intractable in nature #2 chronic opioid management   #8facet arthropathy #4 peripheral vascular disease and history of aortic stenosis with valve repair #5 headaches followed by Dr. Doy [name] on Fiorinal and Valium  Plan: I've had an extended conversation with [name] regarding his current management. I have informed him that he cannot receive further opioids from other practitioners and that we will continue to follow  the practitioner database to confirm this. Any further breach would require consideration for termination from the clinic. He understands this. Furthermore he will continue follow-up with Dr. Doy [name] and I believe they will work to reduce some of the Valium and Fiorinal utilization contingent on their discussions.  Dr. Vashti Hey 10:14 AM

## 2016-06-01 ENCOUNTER — Other Ambulatory Visit: Payer: Self-pay | Admitting: Internal Medicine

## 2016-06-01 DIAGNOSIS — I714 Abdominal aortic aneurysm, without rupture, unspecified: Secondary | ICD-10-CM

## 2016-06-01 DIAGNOSIS — R911 Solitary pulmonary nodule: Secondary | ICD-10-CM

## 2016-06-01 DIAGNOSIS — I739 Peripheral vascular disease, unspecified: Secondary | ICD-10-CM

## 2016-06-10 ENCOUNTER — Ambulatory Visit
Admission: RE | Admit: 2016-06-10 | Discharge: 2016-06-10 | Disposition: A | Discharge status: Home / Self Care | Payer: Medicare Other | Source: Ambulatory Visit | Attending: Internal Medicine | Admitting: Internal Medicine

## 2016-06-10 DIAGNOSIS — K828 Other specified diseases of gallbladder: Secondary | ICD-10-CM | POA: Diagnosis not present

## 2016-06-10 DIAGNOSIS — I77811 Abdominal aortic ectasia: Secondary | ICD-10-CM | POA: Diagnosis not present

## 2016-06-10 DIAGNOSIS — R918 Other nonspecific abnormal finding of lung field: Secondary | ICD-10-CM | POA: Diagnosis not present

## 2016-06-10 DIAGNOSIS — I714 Abdominal aortic aneurysm, without rupture, unspecified: Secondary | ICD-10-CM

## 2016-06-10 DIAGNOSIS — I700 Atherosclerosis of aorta: Secondary | ICD-10-CM | POA: Diagnosis not present

## 2016-06-10 DIAGNOSIS — J439 Emphysema, unspecified: Secondary | ICD-10-CM | POA: Insufficient documentation

## 2016-06-10 DIAGNOSIS — I2510 Atherosclerotic heart disease of native coronary artery without angina pectoris: Secondary | ICD-10-CM | POA: Insufficient documentation

## 2016-06-10 DIAGNOSIS — I70209 Unspecified atherosclerosis of native arteries of extremities, unspecified extremity: Secondary | ICD-10-CM | POA: Insufficient documentation

## 2016-06-10 DIAGNOSIS — I739 Peripheral vascular disease, unspecified: Secondary | ICD-10-CM | POA: Diagnosis present

## 2016-06-10 DIAGNOSIS — K4090 Unilateral inguinal hernia, without obstruction or gangrene, not specified as recurrent: Secondary | ICD-10-CM | POA: Insufficient documentation

## 2016-06-10 DIAGNOSIS — R911 Solitary pulmonary nodule: Secondary | ICD-10-CM

## 2016-06-10 LAB — POCT I-STAT CREATININE: Creatinine, Ser: 1.2 mg/dL (ref 0.61–1.24)

## 2016-06-10 MED ORDER — IOPAMIDOL (ISOVUE-370) INJECTION 76%
100.0000 mL | Freq: Once | INTRAVENOUS | Status: AC | PRN
  Administered 2016-06-10: 100 mL via INTRAVENOUS

## 2016-07-08 ENCOUNTER — Telehealth: Payer: Self-pay

## 2016-07-08 NOTE — Telephone Encounter (Signed)
Spoke with Olivia Mackie at Dr. Stacie Glaze office. She states that patient  has been getting Fentanyl 155mcg patches from Dr. Doy Hutching for several months . Dr. Doy Hutching discovered that Dr. Andree Elk is also prescribing and that patient has not informed either doctor of the fact that he is receiving controls from 2 physicians. Will inform Dr. Andree Elk. Olivia Mackie at Dr. Doy Hutching office states that Dr. Doy Hutching is NOT GOING TO [place] for this patient. Patient was also getting Fiorinol, Valium, and Oxycodone from Dr. Doy Hutching prior to this incident.

## 2016-07-08 NOTE — Telephone Encounter (Signed)
Dr Doy Hutching nurse wants to know does Dr Andree Elk prescribe fentanyl and oxycodone to this patient? Please call Olivia Mackie (nurse) @ [phone] extension [phone]

## 2016-07-14 NOTE — Telephone Encounter (Signed)
Spoke with Linus Orn from Dr Doy Hutching office to confirm fentanyl prescription.  Linus Orn states that Dr Doy Hutching had written fentanyl Rx on 07/07/16 that was destroyed d/t the realization that Dr Andree Elk is prescribing fentanyl.  Also discussed Rx written on 06/01/16 for Oxycodone,  Valium and fiorinal which date was after discussion on 05/11/16, Dr Andree Elk and Dr Doy Hutching re; reducing his daily/meq and only having one MD prescribe controlled substances. Olivia Mackie was going to talk with Dr Doy Hutching and call back with information.

## 2016-07-15 ENCOUNTER — Ambulatory Visit: Payer: Medicare Other | Attending: Anesthesiology | Admitting: Anesthesiology

## 2016-07-15 ENCOUNTER — Encounter: Payer: Self-pay | Admitting: Anesthesiology

## 2016-07-15 VITALS — BP 168/67 | HR 86 | Temp 98.7°F | Resp 16 | Ht 68.0 in | Wt 178.0 lb

## 2016-07-15 DIAGNOSIS — M1288 Other specific arthropathies, not elsewhere classified, other specified site: Secondary | ICD-10-CM | POA: Diagnosis not present

## 2016-07-15 DIAGNOSIS — M5136 Other intervertebral disc degeneration, lumbar region: Secondary | ICD-10-CM | POA: Diagnosis not present

## 2016-07-15 DIAGNOSIS — Z880 Allergy status to penicillin: Secondary | ICD-10-CM | POA: Insufficient documentation

## 2016-07-15 DIAGNOSIS — M4696 Unspecified inflammatory spondylopathy, lumbar region: Secondary | ICD-10-CM | POA: Diagnosis not present

## 2016-07-15 DIAGNOSIS — F1190 Opioid use, unspecified, uncomplicated: Secondary | ICD-10-CM | POA: Diagnosis not present

## 2016-07-15 DIAGNOSIS — R51 Headache: Secondary | ICD-10-CM | POA: Insufficient documentation

## 2016-07-15 DIAGNOSIS — M47816 Spondylosis without myelopathy or radiculopathy, lumbar region: Secondary | ICD-10-CM

## 2016-07-15 DIAGNOSIS — Z888 Allergy status to other drugs, medicaments and biological substances status: Secondary | ICD-10-CM | POA: Insufficient documentation

## 2016-07-15 DIAGNOSIS — Z7982 Long term (current) use of aspirin: Secondary | ICD-10-CM | POA: Diagnosis not present

## 2016-07-15 DIAGNOSIS — M545 Low back pain: Secondary | ICD-10-CM | POA: Diagnosis not present

## 2016-07-15 DIAGNOSIS — G8929 Other chronic pain: Secondary | ICD-10-CM | POA: Insufficient documentation

## 2016-07-15 DIAGNOSIS — M5441 Lumbago with sciatica, right side: Secondary | ICD-10-CM | POA: Diagnosis not present

## 2016-07-15 DIAGNOSIS — Z79899 Other long term (current) drug therapy: Secondary | ICD-10-CM | POA: Diagnosis not present

## 2016-07-15 DIAGNOSIS — M791 Myalgia: Secondary | ICD-10-CM | POA: Diagnosis not present

## 2016-07-15 DIAGNOSIS — M7918 Myalgia, other site: Secondary | ICD-10-CM

## 2016-07-15 DIAGNOSIS — Z79891 Long term (current) use of opiate analgesic: Secondary | ICD-10-CM | POA: Insufficient documentation

## 2016-07-15 DIAGNOSIS — I739 Peripheral vascular disease, unspecified: Secondary | ICD-10-CM | POA: Insufficient documentation

## 2016-07-15 MED ORDER — NEURONTIN 100 MG PO CAPS: 100.0000 mg | ORAL_CAPSULE | Freq: Every evening | ORAL | 2 refills | Status: DC | PRN

## 2016-07-15 MED ORDER — GABAPENTIN 100 MG PO CAPS: 100.0000 mg | ORAL_CAPSULE | Freq: Every evening | ORAL | 2 refills | Status: DC | PRN

## 2016-07-15 MED ORDER — FENTANYL 100 MCG/HR TD PT72: 100.0000 ug | MEDICATED_PATCH | TRANSDERMAL | 0 refills | Status: DC

## 2016-07-15 NOTE — Patient Instructions (Signed)
You were given one prescription for Duragesic patch and one prescription for Gabapentin today.

## 2016-07-15 NOTE — Progress Notes (Signed)
Safety precautions to be maintained throughout the outpatient stay will include: orient to surroundings, keep bed in low position, maintain call bell within reach at all times, provide assistance with transfer out of bed and ambulation.  

## 2016-07-16 NOTE — Progress Notes (Addendum)
Chief complaint is low back pain  Procedure: none  History of present illness: [name] presents for reevaluation last seen in early January. The quality characteristic and distribution of his pain are stable in nature. Furthermore he is taking his Duragesic patch at 100 g level every 2 days and this works well for him. He presents today with his wife and assistance for historical perspective. We have reviewed his narcotic assessment sheet and he continues to derive lifestyle improvement in function with the medications. He denies any significant side effects as well. His dosing has been stable but he is also been receiving oxycodone in the past from Dr. Doy [name] who is his primary care physician.     BP (!) 168/67 (BP Location: Right Arm, Patient Position: Sitting, Cuff Size: Normal)   Pulse 86   Temp 98.7 F (37.1 C) (Oral)   Resp 16   Ht 5\' 8"  (1.727 m)   Wt 178 lb (80.7 kg)   SpO2 98%   BMI 27.06 kg/m    Current Outpatient Prescriptions:  .  atorvastatin (LIPITOR) 10 MG tablet, Take by mouth., Disp: , Rfl:  .  busPIRone (BUSPAR) 15 MG tablet, Take by mouth., Disp: , Rfl:  .  butalbital-aspirin-caffeine (FIORINAL) 50-325-40 MG capsule, Take 1 capsule by mouth 2 (two) times daily as needed for headache., Disp: , Rfl:  .  citalopram (CELEXA) 10 MG tablet, Take 10 mg by mouth daily., Disp: , Rfl:  .  diazepam (VALIUM) 5 MG tablet, Take by mouth., Disp: , Rfl:  .  docusate sodium (COLACE) 100 MG capsule, Take 100 mg by mouth 2 (two) times daily., Disp: , Rfl:  .  fentaNYL (DURAGESIC - DOSED MCG/HR) 100 MCG/HR, Place 1 patch (100 mcg total) onto the skin every 3 (three) days., Disp: 10 patch, Rfl: 0 .  gemfibrozil (LOPID) 600 MG tablet, Take 600 mg by mouth 2 (two) times daily before a meal., Disp: , Rfl:  .  lidocaine (LIDOCAINE PAK) 5 % ointment, Apply 1 application topically as needed., Disp: , Rfl:  .  lisinopril (PRINIVIL,ZESTRIL) 10 MG tablet, Take 10 mg by mouth daily.,  Disp: , Rfl:  .  meperidine (DEMEROL) 50 MG tablet, Take 1 tablet (50 mg total) by mouth 1 day or 1 dose., Disp: 30 tablet, Rfl: 0 .  metoprolol tartrate (LOPRESSOR) 25 MG tablet, Take 25 mg by mouth daily., Disp: , Rfl:  .  Oxycodone HCl 10 MG TABS, Take by mouth. Prn, does not take every day, Disp: , Rfl:  .  tamsulosin (FLOMAX) 0.4 MG CAPS capsule, Take 0.4 mg by mouth daily., Disp: , Rfl:  .  aspirin (GOODSENSE ASPIRIN) 325 MG tablet, Take by mouth., Disp: , Rfl:  .  aspirin 325 MG tablet, Take 325 mg by mouth daily., Disp: , Rfl:  .  aspirin 325 MG tablet, Take by mouth., Disp: , Rfl:  .  atorvastatin (LIPITOR) 10 MG tablet, , Disp: , Rfl: 0 .  busPIRone (BUSPAR) 10 MG tablet, Take 10 mg by mouth 3 (three) times daily. Reported on 11/11/2015, Disp: , Rfl:  .  busPIRone (BUSPAR) 15 MG tablet, Take 15 mg by mouth 3 (three) times daily. , Disp: , Rfl:  .  butalbital-acetaminophen-caffeine (FIORICET, ESGIC) 50-325-40 MG per tablet, Take 1 tablet by mouth 2 (two) times daily as needed for headache. Reported on 11/11/2015, Disp: , Rfl:  .  butalbital-aspirin-caffeine (FIORINAL) 50-325-40 MG capsule, Take by mouth., Disp: , Rfl:  .  citalopram (CELEXA)  20 MG tablet, , Disp: , Rfl: 0 .  clotrimazole-betamethasone (LOTRISONE) cream, Apply 1 application topically 2 (two) times daily. Reported on 11/11/2015, Disp: , Rfl:  .  clotrimazole-betamethasone (LOTRISONE) cream, Apply topically., Disp: , Rfl:  .  docusate sodium (STOOL SOFTENER) 100 MG capsule, Take by mouth., Disp: , Rfl:  .  gemfibrozil (LOPID) 600 MG tablet, Take by mouth., Disp: , Rfl:  .  lidocaine (XYLOCAINE) 5 % ointment, as directed. , Disp: , Rfl:  .  lisinopril (PRINIVIL,ZESTRIL) 10 MG tablet, Take by mouth., Disp: , Rfl:  .  metoprolol tartrate (LOPRESSOR) 25 MG tablet, Take by mouth., Disp: , Rfl:  .  NEURONTIN 100 MG capsule, Take 1 capsule (100 mg total) by mouth at bedtime as needed and may repeat dose one time if needed., Disp: 30  capsule, Rfl: 2 .  tamsulosin (FLOMAX) 0.4 MG CAPS capsule, Take by mouth., Disp: , Rfl:  .  VALIUM 5 MG tablet, , Disp: , Rfl: 0  Patient Active Problem List   Diagnosis Date Noted  . Stage I pressure ulcer of back 11/03/2015  . Facet arthritis of lumbar region ([place]) 09/26/2014  . DDD (degenerative disc disease), lumbar 09/26/2014  . Myofacial muscle pain 09/26/2014    Allergies  Allergen Reactions  . Celebrex [Celecoxib] Other (See Comments) and Shortness Of Breath     CHEST PAIN Chest pain  . Carvedilol Other (See Comments)    Extreme fatigue  . Penicillins Other (See Comments) and Rash    unknown    Physical exam pupils are equally round and reactive to light  Extraocular muscles are intact   Heart is regular rate and rhythm and lower extremity strength and function remains a baseline with no significant changes noted.  He continues to ambulate with an antalgic gait and her strength appears to be at baseline.  Assessment  #1 chronic low back pain intractable in nature #2 chronic opioid management   #52facet arthropathy #4 peripheral vascular disease and history of aortic stenosis with valve repair #5 headaches followed by Dr. Doy [name] on Fiorinal and Valium  Plan:Had a long discussion for clarification with he and his wife regarding the pain clinic policies in reference to opioid prescription and adherence. Based on her discussion today about the there was some confusion regarding protocol that is now been clarified. Furthermore based on our review of the [place] prescriber database there was confusion regarding the pharmacy status as they had had inconsistent success getting the Duragesic patches at one particular pharmacy. I believe this is been resolved however her plan is to do suggest [name] over the next several months. At present were going to move him from the 100 g patch every 2 days to every 3 days. We will then plan on a monthly reduction to a more  moderate dosing. I've also cautioned him about the interaction of benzodiazepines and opioids and despite his tolerance that this is real issue and that there is risk involved. I've asked that he discontinue his evening dose of Valium. In the past she's been taking this 5 mg tablet 4 times a day we will have him return to clinic in 1 month for reevaluation as we begin this dose reduction of the Duragesic.    Dr. Vashti Hey 11:32 AM

## 2016-07-26 ENCOUNTER — Telehealth: Payer: Self-pay | Admitting: Anesthesiology

## 2016-07-26 ENCOUNTER — Telehealth: Payer: Self-pay | Admitting: *Deleted

## 2016-07-26 NOTE — Telephone Encounter (Signed)
FYI, wanted to let Dr. Andree Elk know they got Phils script filled at [facility] [address] [facility] [place]. Thank you

## 2016-08-11 ENCOUNTER — Ambulatory Visit: Payer: Medicare Other | Admitting: Anesthesiology

## 2016-08-13 ENCOUNTER — Encounter: Payer: Self-pay | Admitting: Anesthesiology

## 2016-08-13 ENCOUNTER — Ambulatory Visit: Payer: Medicare Other | Attending: Anesthesiology | Admitting: Anesthesiology

## 2016-08-13 VITALS — BP 150/61 | HR 71 | Temp 98.3°F | Resp 16 | Ht 69.0 in | Wt 180.0 lb

## 2016-08-13 DIAGNOSIS — M47816 Spondylosis without myelopathy or radiculopathy, lumbar region: Secondary | ICD-10-CM

## 2016-08-13 DIAGNOSIS — Z880 Allergy status to penicillin: Secondary | ICD-10-CM | POA: Diagnosis not present

## 2016-08-13 DIAGNOSIS — Z888 Allergy status to other drugs, medicaments and biological substances status: Secondary | ICD-10-CM | POA: Insufficient documentation

## 2016-08-13 DIAGNOSIS — R51 Headache: Secondary | ICD-10-CM | POA: Diagnosis not present

## 2016-08-13 DIAGNOSIS — M4696 Unspecified inflammatory spondylopathy, lumbar region: Secondary | ICD-10-CM | POA: Diagnosis not present

## 2016-08-13 DIAGNOSIS — L8991 Pressure ulcer of unspecified site, stage 1: Secondary | ICD-10-CM | POA: Diagnosis not present

## 2016-08-13 DIAGNOSIS — I739 Peripheral vascular disease, unspecified: Secondary | ICD-10-CM | POA: Diagnosis not present

## 2016-08-13 DIAGNOSIS — Z7982 Long term (current) use of aspirin: Secondary | ICD-10-CM | POA: Insufficient documentation

## 2016-08-13 DIAGNOSIS — G2581 Restless legs syndrome: Secondary | ICD-10-CM | POA: Diagnosis not present

## 2016-08-13 DIAGNOSIS — G8929 Other chronic pain: Secondary | ICD-10-CM | POA: Diagnosis not present

## 2016-08-13 DIAGNOSIS — I350 Nonrheumatic aortic (valve) stenosis: Secondary | ICD-10-CM | POA: Diagnosis not present

## 2016-08-13 DIAGNOSIS — L89109 Pressure ulcer of unspecified part of back, unspecified stage: Secondary | ICD-10-CM | POA: Diagnosis not present

## 2016-08-13 DIAGNOSIS — G894 Chronic pain syndrome: Secondary | ICD-10-CM | POA: Diagnosis not present

## 2016-08-13 DIAGNOSIS — M5136 Other intervertebral disc degeneration, lumbar region: Secondary | ICD-10-CM | POA: Insufficient documentation

## 2016-08-13 DIAGNOSIS — F1190 Opioid use, unspecified, uncomplicated: Secondary | ICD-10-CM

## 2016-08-13 DIAGNOSIS — M5441 Lumbago with sciatica, right side: Secondary | ICD-10-CM | POA: Diagnosis not present

## 2016-08-13 DIAGNOSIS — Z79891 Long term (current) use of opiate analgesic: Secondary | ICD-10-CM | POA: Diagnosis not present

## 2016-08-13 MED ORDER — FENTANYL 100 MCG/HR TD PT72: 100.0000 ug | MEDICATED_PATCH | TRANSDERMAL | 0 refills | Status: DC

## 2016-08-13 NOTE — Progress Notes (Signed)
Chief complaint is low back pain  Procedure: none  History of present illness: [name] presents for reevaluation. He was last seen a month ago and continues to have breakthrough pain on day 3. He and his wife have brought this to our attention. We have tried to dial back his opioid exposure secondary to his risk profile. He is sleepy was receiving breakthrough medication from Dr. Doy Hutching and this is been addressed. He maintains that he is no longer doing this and receiving opioids only from [place]. As such she is having more pain at present. The quality characteristic addition we should've the pain are stable however he has less medication for this breakthrough. He is requesting to use the patch on a every 2 day basis and additional medication for breakthrough if this would be reasonable. No change in lower extremity strength or function or bowel bladder function are noted. He is still taking diazepam at bedtime for sleep and restless leg syndrome and has discontinued Neurontin secondary to side effects. He occasionally uses anti-inflammatories as well to keep his back pain and leg pain under control. No other diverting or illicit use is documented     BP (!) 150/61   Pulse 71   Temp 98.3 F (36.8 C) (Oral)   Resp 16   Ht 5\' 9"  (1.753 m)   Wt 180 lb (81.6 kg)   SpO2 99%   BMI 26.58 kg/m    Current Outpatient Prescriptions:  .  atorvastatin (LIPITOR) 10 MG tablet, Take by mouth., Disp: , Rfl:  .  busPIRone (BUSPAR) 15 MG tablet, Take by mouth., Disp: , Rfl:  .  butalbital-aspirin-caffeine (FIORINAL) 50-325-40 MG capsule, Take 1 capsule by mouth 2 (two) times daily as needed for headache., Disp: , Rfl:  .  citalopram (CELEXA) 10 MG tablet, Take 10 mg by mouth daily., Disp: , Rfl:  .  docusate sodium (COLACE) 100 MG capsule, Take 100 mg by mouth 2 (two) times daily., Disp: , Rfl:  .  fentaNYL (DURAGESIC - DOSED MCG/HR) 100 MCG/HR, Place 1 patch (100 mcg total) onto the skin every 3 (three)  days., Disp: 10 patch, Rfl: 0 .  gemfibrozil (LOPID) 600 MG tablet, Take 600 mg by mouth 2 (two) times daily before a meal., Disp: , Rfl:  .  lidocaine (LIDOCAINE PAK) 5 % ointment, Apply 1 application topically as needed., Disp: , Rfl:  .  lisinopril (PRINIVIL,ZESTRIL) 10 MG tablet, Take 10 mg by mouth daily., Disp: , Rfl:  .  meperidine (DEMEROL) 50 MG tablet, Take 1 tablet (50 mg total) by mouth 1 day or 1 dose., Disp: 30 tablet, Rfl: 0 .  metoprolol tartrate (LOPRESSOR) 25 MG tablet, Take 25 mg by mouth daily., Disp: , Rfl:  .  NEURONTIN 100 MG capsule, Take 1 capsule (100 mg total) by mouth at bedtime as needed and may repeat dose one time if needed., Disp: 30 capsule, Rfl: 2 .  tamsulosin (FLOMAX) 0.4 MG CAPS capsule, Take 0.4 mg by mouth daily., Disp: , Rfl:  .  VALIUM 5 MG tablet, , Disp: , Rfl: 0  Patient Active Problem List   Diagnosis Date Noted  . Stage I pressure ulcer of back 11/03/2015  . Facet arthritis of lumbar region ([place]) 09/26/2014  . DDD (degenerative disc disease), lumbar 09/26/2014  . Myofacial muscle pain 09/26/2014    Allergies  Allergen Reactions  . Celebrex [Celecoxib] Other (See Comments) and Shortness Of Breath     CHEST PAIN Chest pain  .  Carvedilol Other (See Comments)    Extreme fatigue  . Penicillins Other (See Comments) and Rash    unknown    Physical exam pupils are equally round and reactive to light  Extraocular muscles are intact   Heart is regular rate and rhythm and lower extremity strength and function remains a baseline with no significant changes noted.  He continues to ambulate with an antalgic gait and her strength appears to be at baseline.His strength appears to be at baseline.  Assessment  #1 chronic low back pain intractable in nature #2 chronic opioid management   #83facet arthropathy #4 peripheral vascular disease and history of aortic stenosis with valve repair #5 headaches followed by Dr. Doy Hutching on Fiorinal and  Valium  Plan once again had taken a considerable period of time to address the opioid issues with Natalio and his wife. I think his current plan changes are reasonable and appropriate to address both his pain and minimize his risk profile to opioid side effects and respiratory depression. I'm going to keep him on the every 3 day 100 mic patch at present and hopefully will be to reduce the patches to a lower level but certainly I feel comfortable with the fact his total opioid exposure is reduced and more reasonable. I've also talked to them about the risk of concomitant use of benzodiazepines and opioids. He is to return to clinic in 1 month and a prescription was given for his Duragesic patch today.  Dr. Vashti Hey 9:42 AM

## 2016-08-13 NOTE — Progress Notes (Signed)
Safety precautions to be maintained throughout the outpatient stay will include: orient to surroundings, keep bed in low position, maintain call bell within reach at all times, provide assistance with transfer out of bed and ambulation.  

## 2016-09-15 ENCOUNTER — Ambulatory Visit: Payer: Medicare Other | Attending: Anesthesiology | Admitting: Anesthesiology

## 2016-09-15 ENCOUNTER — Encounter: Payer: Self-pay | Admitting: Anesthesiology

## 2016-09-15 VITALS — BP 133/64 | HR 90 | Temp 98.6°F | Resp 16 | Ht 68.0 in | Wt 182.0 lb

## 2016-09-15 DIAGNOSIS — G8929 Other chronic pain: Secondary | ICD-10-CM | POA: Diagnosis not present

## 2016-09-15 DIAGNOSIS — M25511 Pain in right shoulder: Secondary | ICD-10-CM

## 2016-09-15 DIAGNOSIS — M160 Bilateral primary osteoarthritis of hip: Secondary | ICD-10-CM | POA: Insufficient documentation

## 2016-09-15 DIAGNOSIS — M5136 Other intervertebral disc degeneration, lumbar region: Secondary | ICD-10-CM | POA: Insufficient documentation

## 2016-09-15 DIAGNOSIS — R51 Headache: Secondary | ICD-10-CM | POA: Insufficient documentation

## 2016-09-15 DIAGNOSIS — Z7982 Long term (current) use of aspirin: Secondary | ICD-10-CM | POA: Diagnosis not present

## 2016-09-15 DIAGNOSIS — I739 Peripheral vascular disease, unspecified: Secondary | ICD-10-CM | POA: Insufficient documentation

## 2016-09-15 DIAGNOSIS — M19011 Primary osteoarthritis, right shoulder: Secondary | ICD-10-CM | POA: Diagnosis not present

## 2016-09-15 DIAGNOSIS — Z79891 Long term (current) use of opiate analgesic: Secondary | ICD-10-CM | POA: Diagnosis not present

## 2016-09-15 DIAGNOSIS — G894 Chronic pain syndrome: Secondary | ICD-10-CM | POA: Diagnosis not present

## 2016-09-15 DIAGNOSIS — F1190 Opioid use, unspecified, uncomplicated: Secondary | ICD-10-CM | POA: Diagnosis not present

## 2016-09-15 DIAGNOSIS — M5441 Lumbago with sciatica, right side: Secondary | ICD-10-CM | POA: Diagnosis not present

## 2016-09-15 DIAGNOSIS — Z880 Allergy status to penicillin: Secondary | ICD-10-CM | POA: Insufficient documentation

## 2016-09-15 DIAGNOSIS — M47816 Spondylosis without myelopathy or radiculopathy, lumbar region: Secondary | ICD-10-CM

## 2016-09-15 DIAGNOSIS — M25552 Pain in left hip: Secondary | ICD-10-CM | POA: Diagnosis not present

## 2016-09-15 DIAGNOSIS — M25512 Pain in left shoulder: Secondary | ICD-10-CM | POA: Diagnosis not present

## 2016-09-15 DIAGNOSIS — M51369 Other intervertebral disc degeneration, lumbar region without mention of lumbar back pain or lower extremity pain: Secondary | ICD-10-CM

## 2016-09-15 DIAGNOSIS — Z888 Allergy status to other drugs, medicaments and biological substances status: Secondary | ICD-10-CM | POA: Insufficient documentation

## 2016-09-15 DIAGNOSIS — Z79899 Other long term (current) drug therapy: Secondary | ICD-10-CM | POA: Diagnosis not present

## 2016-09-15 DIAGNOSIS — M545 Low back pain: Secondary | ICD-10-CM | POA: Insufficient documentation

## 2016-09-15 DIAGNOSIS — M4696 Unspecified inflammatory spondylopathy, lumbar region: Secondary | ICD-10-CM | POA: Diagnosis not present

## 2016-09-15 DIAGNOSIS — M19012 Primary osteoarthritis, left shoulder: Secondary | ICD-10-CM | POA: Diagnosis not present

## 2016-09-15 MED ORDER — FENTANYL 100 MCG/HR TD PT72: 100.0000 ug | MEDICATED_PATCH | TRANSDERMAL | 0 refills | Status: DC

## 2016-09-15 NOTE — Patient Instructions (Signed)

## 2016-09-15 NOTE — Progress Notes (Addendum)
Chief complaint is low back pain  Procedure: none  History of present illness: [name] presents for reevaluation. He was last seen April 20. He has been doing well with his medication management. He generally gets 2 good days of good relief with the Duragesic. He frequently will breakthrough on day 3. He is tolerating the medications well and based on his narcotic assessment sheet he is deriving good relief and improved overall function with the medications. He describes knee and hip pain and bilateral shoulder pain from osteoarthritis as well. These pains also are better with the Duragesic. He had been on some additional extremity medication and this was discontinued and has required some time for adjustment to his baseline medication only. Furthermore he describes some periodic headaches of attention type that have been treated with Fiorinal in the past. He maintains that he has responded well to the Fiorinal with minimal side effects otherwise he is in his usual state of health with no changes in lower extremity strength or function or bowel bladder function. He denies any diverting or illicit use with his medications as well.    BP 133/64   Pulse 90   Temp 98.6 F (37 C) (Oral)   Resp 16   Ht 5\' 8"  (1.727 m)   Wt 182 lb (82.6 kg)   SpO2 99%   BMI 27.67 kg/m    Current Outpatient Prescriptions:  .  aspirin 325 MG EC tablet, Take 325 mg by mouth daily., Disp: , Rfl:  .  atorvastatin (LIPITOR) 10 MG tablet, Take 10 mg by mouth daily. , Disp: , Rfl:  .  busPIRone (BUSPAR) 15 MG tablet, Take 15 mg by mouth 3 (three) times daily. , Disp: , Rfl:  .  citalopram (CELEXA) 20 MG tablet, Take 20 mg by mouth daily., Disp: , Rfl: 0 .  diazepam (VALIUM) 5 MG tablet, Take 5 mg by mouth every 6 (six) hours as needed. , Disp: , Rfl:  .  docusate sodium (COLACE) 100 MG capsule, Take 100 mg by mouth 2 (two) times daily., Disp: , Rfl:  .  fentaNYL (DURAGESIC - DOSED MCG/HR) 100 MCG/HR, Place 1  patch (100 mcg total) onto the skin every 3 (three) days., Disp: 10 patch, Rfl: 0 .  gemfibrozil (LOPID) 600 MG tablet, Take 600 mg by mouth 2 (two) times daily before a meal., Disp: , Rfl:  .  lisinopril (PRINIVIL,ZESTRIL) 10 MG tablet, Take 10 mg by mouth daily., Disp: , Rfl:  .  metoprolol tartrate (LOPRESSOR) 25 MG tablet, Take 25 mg by mouth daily., Disp: , Rfl:  .  tamsulosin (FLOMAX) 0.4 MG CAPS capsule, Take 0.4 mg by mouth daily., Disp: , Rfl:  .  butalbital-aspirin-caffeine (FIORINAL) 50-325-40 MG capsule, Take 1 capsule by mouth 2 (two) times daily as needed for headache., Disp: , Rfl:   Patient Active Problem List   Diagnosis Date Noted  . Stage I pressure ulcer of back 11/03/2015  . Facet arthritis of lumbar region ([place]) 09/26/2014  . DDD (degenerative disc disease), lumbar 09/26/2014  . Myofacial muscle pain 09/26/2014    Allergies  Allergen Reactions  . Celebrex [Celecoxib] Other (See Comments) and Shortness Of Breath     CHEST PAIN Chest pain  . Carvedilol Other (See Comments)    Extreme fatigue  . Penicillins Other (See Comments) and Rash    unknown   Review of systems: Cardiac: No chest pain or shortness of breath GI: No constipation  Physical exam pupils are equally  round and reactive to light  Extraocular muscles are intact   Heart is regular rate and rhythm and lower extremity strength and function remains a baseline with no significant changes noted.    Assessment  #1 chronic low back pain intractable in nature #2 chronic opioid management   #50facet arthropathy #4 peripheral vascular disease and history of aortic stenosis with valve repair #5 headaches followed by Dr. 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 I'm going to refill his Duragesic for the 100 my patches for the next 2 months. We'll have him return to clinic in 2 months. I talked to him once again about the need to ensure that he  receives opioids only from the pain control Center. 2. Continue follow-up with his primary care physicians and with Dr. Doy Hutching. In the past Dr. Doy Hutching has written for his  fiorninal I and I think that this is reasonable considering that he continues to have the tension headaches and he has responded to it favorably. He maintains that he had been on pure now and Duragesic for a prolonged period of time with no evidence of side effect or sedation. We will contact Dr. Doy Hutching to see if we can help further coordinate this. [name] is to return to clinic in 2 months for reevaluation.   Dr. Vashti Hey 4:02 PM

## 2016-09-15 NOTE — Progress Notes (Signed)
Nursing Pain Medication Assessment:  Safety precautions to be maintained throughout the outpatient stay will include: orient to surroundings, keep bed in low position, maintain call bell within reach at all times, provide assistance with transfer out of bed and ambulation.  Medication Inspection Compliance: Mr. Mizzell did not comply with our request to bring his pills to be counted. He was reminded that bringing the medication bottles, even when empty, is a requirement.  Medication: None brought in. Pill/Patch Count: None available to be counted. Bottle Appearance: No container available. Did not bring bottle(s) to appointment. Filled Date: N/A Last Medication intake:  Today

## 2016-11-11 ENCOUNTER — Encounter: Payer: Self-pay | Admitting: Anesthesiology

## 2016-11-11 ENCOUNTER — Ambulatory Visit: Payer: Medicare Other | Attending: Anesthesiology | Admitting: Anesthesiology

## 2016-11-11 VITALS — BP 148/68 | HR 97 | Temp 98.6°F | Resp 16 | Ht 68.0 in | Wt 180.0 lb

## 2016-11-11 DIAGNOSIS — R51 Headache: Secondary | ICD-10-CM | POA: Insufficient documentation

## 2016-11-11 DIAGNOSIS — M5136 Other intervertebral disc degeneration, lumbar region: Secondary | ICD-10-CM

## 2016-11-11 DIAGNOSIS — M5441 Lumbago with sciatica, right side: Secondary | ICD-10-CM | POA: Diagnosis not present

## 2016-11-11 DIAGNOSIS — F1190 Opioid use, unspecified, uncomplicated: Secondary | ICD-10-CM

## 2016-11-11 DIAGNOSIS — M4696 Unspecified inflammatory spondylopathy, lumbar region: Secondary | ICD-10-CM | POA: Diagnosis not present

## 2016-11-11 DIAGNOSIS — I739 Peripheral vascular disease, unspecified: Secondary | ICD-10-CM | POA: Diagnosis not present

## 2016-11-11 DIAGNOSIS — I350 Nonrheumatic aortic (valve) stenosis: Secondary | ICD-10-CM | POA: Insufficient documentation

## 2016-11-11 DIAGNOSIS — M545 Low back pain: Secondary | ICD-10-CM | POA: Insufficient documentation

## 2016-11-11 DIAGNOSIS — M160 Bilateral primary osteoarthritis of hip: Secondary | ICD-10-CM | POA: Insufficient documentation

## 2016-11-11 DIAGNOSIS — Z79891 Long term (current) use of opiate analgesic: Secondary | ICD-10-CM | POA: Insufficient documentation

## 2016-11-11 DIAGNOSIS — M51369 Other intervertebral disc degeneration, lumbar region without mention of lumbar back pain or lower extremity pain: Secondary | ICD-10-CM

## 2016-11-11 DIAGNOSIS — M19011 Primary osteoarthritis, right shoulder: Secondary | ICD-10-CM | POA: Insufficient documentation

## 2016-11-11 DIAGNOSIS — G894 Chronic pain syndrome: Secondary | ICD-10-CM

## 2016-11-11 DIAGNOSIS — M7918 Myalgia, other site: Secondary | ICD-10-CM

## 2016-11-11 DIAGNOSIS — M47816 Spondylosis without myelopathy or radiculopathy, lumbar region: Secondary | ICD-10-CM

## 2016-11-11 DIAGNOSIS — G8929 Other chronic pain: Secondary | ICD-10-CM

## 2016-11-11 DIAGNOSIS — M19012 Primary osteoarthritis, left shoulder: Secondary | ICD-10-CM | POA: Diagnosis not present

## 2016-11-11 DIAGNOSIS — M791 Myalgia: Secondary | ICD-10-CM

## 2016-11-11 MED ORDER — FENTANYL 100 MCG/HR TD PT72: 100.0000 ug | MEDICATED_PATCH | TRANSDERMAL | 0 refills | Status: DC

## 2016-11-11 NOTE — Progress Notes (Signed)
Nursing Pain Medication Assessment:  Safety precautions to be maintained throughout the outpatient stay will include: orient to surroundings, keep bed in low position, maintain call bell within reach at all times, provide assistance with transfer out of bed and ambulation.  Medication Inspection Compliance: Pill count conducted under aseptic conditions, in front of the patient. Neither the pills nor the bottle was removed from the patient's sight at any time. Once count was completed pills were immediately returned to the patient in their original bottle.  Medication: Fentanyl patch Pill/Patch Count: 3 of 10 patches remain Pill/Patch Appearance: Markings consistent with prescribed medication Bottle Appearance: Standard pharmacy container. Clearly labeled. Filled Date:06 / 28 / 2018 Last Medication intake:  Today

## 2016-11-11 NOTE — Patient Instructions (Signed)
You were given 2 prescriptions for Duragesic Patches today.

## 2016-11-14 NOTE — Progress Notes (Signed)
Chief complaint is low back pain  Procedure: none  History of present illness: [name] is here for follow-up having been seen recently. The quality characteristic addition we should've his diffuse body pain remain unchanged. He continues to take his Duragesic patch every 3 days. He still breaks through her on day 3 and is inquiring about increasing this to every other day. Otherwise no change in the quality characteristic distribution or frequency of his symptoms are noted and based on his narcotic assessment sheet, he continues to drive good functional lifestyle improvement with the medications. Unfortunately he has remained recalcitrant to conservative therapy. Based on her conversation today he has remained compliant with her regimen and is receiving no opioids from other sources. We have reviewed his [facility] practitioner database information and it is appropriate.   BP (!) 148/68   Pulse 97   Temp 98.6 F (37 C)   Resp 16   Ht 5\' 8"  (1.727 m)   Wt 180 lb (81.6 kg)   SpO2 97%   BMI 27.37 kg/m    Current Outpatient Prescriptions:  .  aspirin 325 MG EC tablet, Take 325 mg by mouth daily., Disp: , Rfl:  .  atorvastatin (LIPITOR) 10 MG tablet, Take 10 mg by mouth daily. , Disp: , Rfl:  .  busPIRone (BUSPAR) 15 MG tablet, Take 15 mg by mouth 3 (three) times daily. , Disp: , Rfl:  .  butalbital-aspirin-caffeine (FIORINAL) 50-325-40 MG capsule, Take 1 capsule by mouth 2 (two) times daily as needed for headache., Disp: , Rfl:  .  citalopram (CELEXA) 20 MG tablet, Take 20 mg by mouth daily., Disp: , Rfl: 0 .  clindamycin (CLEOCIN) 300 MG capsule, TAKE 2 CAPSULES BY MOUTH TO START ON DAY 1, THEN TAKE 1 CAPSULE  BY MOUTH THREE TIMES A DAY, Disp: , Rfl: 0 .  diazepam (VALIUM) 5 MG tablet, Take 5 mg by mouth every 6 (six) hours as needed. , Disp: , Rfl:  .  docusate sodium (COLACE) 100 MG capsule, Take 100 mg by mouth 2 (two) times daily., Disp: , Rfl:  .  fentaNYL (DURAGESIC - DOSED  MCG/HR) 100 MCG/HR, Place 1 patch (100 mcg total) onto the skin every 3 (three) days., Disp: 10 patch, Rfl: 0 .  gemfibrozil (LOPID) 600 MG tablet, Take 600 mg by mouth 2 (two) times daily before a meal., Disp: , Rfl:  .  lisinopril (PRINIVIL,ZESTRIL) 10 MG tablet, Take 10 mg by mouth daily., Disp: , Rfl:  .  metoprolol tartrate (LOPRESSOR) 25 MG tablet, Take 25 mg by mouth daily., Disp: , Rfl:  .  tamsulosin (FLOMAX) 0.4 MG CAPS capsule, Take 0.4 mg by mouth daily., Disp: , Rfl:   Patient Active Problem List   Diagnosis Date Noted  . Stage I pressure ulcer of back 11/03/2015  . Facet arthritis of lumbar region ([place]) 09/26/2014  . DDD (degenerative disc disease), lumbar 09/26/2014  . Myofacial muscle pain 09/26/2014    Allergies  Allergen Reactions  . Celebrex [Celecoxib] Other (See Comments) and Shortness Of Breath     CHEST PAIN Chest pain  . Carvedilol Other (See Comments)    Extreme fatigue  . Penicillins Other (See Comments) and Rash    unknown   Review of systems: Cardiac: No chest pain or shortness of breath GI: No constipation  Physical exam pupils are equally round and reactive to light  Extraocular muscles are intact   Heart is regular rate and rhythm and lower extremity  strength and function remains a baseline with no significant changes noted.    Assessment  #1 chronic low back pain intractable in nature #2 chronic opioid management   #44facet arthropathy #4 peripheral vascular disease and history of aortic stenosis with valve repair #5 headaches followed by Dr. 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 We will refill his medications once again for the next 2 months with return to clinic in 2 months. We will leave his dosing unchanged as he seems to be responding favorably to our current regimen. No side effects are noted with this. Prescriptions were 4 July 28 and August 27. 2. Continue follow-up with his  primary care physicians and with Dr. Doy Hutching..   The [facility] practitioner database information was reviewed and found to be appropriate.  Dr. Vashti Hey 3:56 PM

## 2016-11-18 LAB — TOXASSURE SELECT 13 (MW), URINE

## 2016-12-01 ENCOUNTER — Telehealth: Payer: Self-pay | Admitting: Anesthesiology

## 2016-12-01 NOTE — Telephone Encounter (Signed)
[name] had dental work today and got scripts for [employer] 5-325, ibuprofen, and tylenol and a mouthwash Wanted to let us know

## 2016-12-02 NOTE — Telephone Encounter (Signed)
Will note in his chart.

## 2016-12-02 NOTE — Telephone Encounter (Signed)
Update, patient did not fill script for oxycodone from dentist

## 2016-12-28 ENCOUNTER — Ambulatory Visit: Payer: Medicare Other | Attending: Anesthesiology | Admitting: Anesthesiology

## 2016-12-28 ENCOUNTER — Encounter: Payer: Self-pay | Admitting: Anesthesiology

## 2016-12-28 VITALS — BP 153/81 | HR 82 | Temp 98.8°F | Resp 16 | Ht 68.0 in | Wt 175.0 lb

## 2016-12-28 DIAGNOSIS — M25512 Pain in left shoulder: Secondary | ICD-10-CM | POA: Diagnosis not present

## 2016-12-28 DIAGNOSIS — M25552 Pain in left hip: Secondary | ICD-10-CM | POA: Diagnosis present

## 2016-12-28 DIAGNOSIS — M5441 Lumbago with sciatica, right side: Secondary | ICD-10-CM | POA: Diagnosis not present

## 2016-12-28 DIAGNOSIS — M4696 Unspecified inflammatory spondylopathy, lumbar region: Secondary | ICD-10-CM | POA: Diagnosis not present

## 2016-12-28 DIAGNOSIS — M25511 Pain in right shoulder: Secondary | ICD-10-CM | POA: Diagnosis not present

## 2016-12-28 DIAGNOSIS — M25551 Pain in right hip: Secondary | ICD-10-CM | POA: Insufficient documentation

## 2016-12-28 DIAGNOSIS — G8929 Other chronic pain: Secondary | ICD-10-CM

## 2016-12-28 DIAGNOSIS — M545 Low back pain: Secondary | ICD-10-CM | POA: Diagnosis present

## 2016-12-28 DIAGNOSIS — M47816 Spondylosis without myelopathy or radiculopathy, lumbar region: Secondary | ICD-10-CM

## 2016-12-28 DIAGNOSIS — G894 Chronic pain syndrome: Secondary | ICD-10-CM | POA: Insufficient documentation

## 2016-12-28 DIAGNOSIS — M5136 Other intervertebral disc degeneration, lumbar region: Secondary | ICD-10-CM | POA: Diagnosis not present

## 2016-12-28 DIAGNOSIS — Z79899 Other long term (current) drug therapy: Secondary | ICD-10-CM | POA: Insufficient documentation

## 2016-12-28 DIAGNOSIS — Z7982 Long term (current) use of aspirin: Secondary | ICD-10-CM | POA: Diagnosis not present

## 2016-12-28 DIAGNOSIS — F1190 Opioid use, unspecified, uncomplicated: Secondary | ICD-10-CM | POA: Diagnosis not present

## 2016-12-28 MED ORDER — FENTANYL 100 MCG/HR TD PT72: 100.0000 ug | MEDICATED_PATCH | TRANSDERMAL | 0 refills | Status: DC

## 2016-12-28 NOTE — Progress Notes (Signed)
Nursing Pain Medication Assessment:  Safety precautions to be maintained throughout the outpatient stay will include: orient to surroundings, keep bed in low position, maintain call bell within reach at all times, provide assistance with transfer out of bed and ambulation.  Medication Inspection Compliance: Pill count conducted under aseptic conditions, in front of the patient. Neither the pills nor the bottle was removed from the patient's sight at any time. Once count was completed pills were immediately returned to the patient in their original bottle.  Medication: Fentanyl patch Pill/Patch Count: 7 of 10 pills remain Pill/Patch Appearance: Markings consistent with prescribed medication Bottle Appearance: Standard pharmacy container. Clearly labeled. Filled Date: 08 / 27/ 2018 Last Medication intake:  Today

## 2016-12-29 NOTE — Progress Notes (Signed)
Subjective:  Patient ID: [name], male    DOB: Jun 05, 1945  Age: 71 y.o. MRN: [id number]  CC: Back Pain (lower right); Hip Pain (both); Shoulder Pain (both); and Neck Pain (all over)   HPI [name] presents for Reevaluation. He was last seen approximately 2 months ago. He continues to have pain in the low back affecting both hips and knees. He also is having bilateral shoulder pain and neck pain. The quality characteristic and distribution is been stable in nature with primarily an aching gnawing type pain alleviated to a large extent by the Duragesic patches. He still has frequent breakthrough pain on day 3 of his usage. He is also describing some neck pain worse with rotational movement and upon arising in the morning. He denies any problems with upper extremity strength or function. He's been tolerating his medications well with some mild constipation but otherwise no significant complaints noted. I reviewed his narcotic assessment sheet and he continues to derive good lifestyle functional improvement with his medications. He denies any diverting or illicit use. His lower extremity strength and function has been stable as well.  Outpatient Medications Prior to Visit  Medication Sig Dispense Refill  . aspirin 325 MG EC tablet Take 325 mg by mouth daily.    Marland Kitchen atorvastatin (LIPITOR) 10 MG tablet Take 10 mg by mouth daily.     . busPIRone (BUSPAR) 15 MG tablet Take 15 mg by mouth 3 (three) times daily.     . butalbital-aspirin-caffeine (FIORINAL) 50-325-40 MG capsule Take 1 capsule by mouth every 6 (six) hours as needed for headache.     . citalopram (CELEXA) 20 MG tablet Take 20 mg by mouth daily.  0  . diazepam (VALIUM) 5 MG tablet Take 5 mg by mouth every 6 (six) hours as needed.     . docusate sodium (COLACE) 100 MG capsule Take 100 mg by mouth 2 (two) times daily.    Marland Kitchen gemfibrozil (LOPID) 600 MG tablet Take 600 mg by mouth 2 (two) times daily before a meal.    . lisinopril  (PRINIVIL,ZESTRIL) 10 MG tablet Take 10 mg by mouth daily.    . metoprolol tartrate (LOPRESSOR) 25 MG tablet Take 25 mg by mouth daily.    . tamsulosin (FLOMAX) 0.4 MG CAPS capsule Take 0.4 mg by mouth daily.    . fentaNYL (DURAGESIC - DOSED MCG/HR) 100 MCG/HR Place 1 patch (100 mcg total) onto the skin every 3 (three) days. 10 patch 0  . clindamycin (CLEOCIN) 300 MG capsule TAKE 2 CAPSULES BY MOUTH TO START ON DAY 1, THEN TAKE 1 CAPSULE  BY MOUTH THREE TIMES A DAY  0   No facility-administered medications prior to visit.    ROS Review of Systems  Cardiac: No angina Pulmonary: No dyspnea or shortness of breath GI: Occasional constipation CNS no sedation  Objective:  BP (!) 153/81   Pulse 82   Temp 98.8 F (37.1 C)   Resp 16   Ht 5\' 8"  (1.727 m)   Wt 175 lb (79.4 kg)   SpO2 93%   BMI 26.61 kg/m    BP Readings from Last 3 Encounters:  12/28/16 (!) 153/81  11/11/16 (!) 148/68  09/15/16 133/64     Wt Readings from Last 3 Encounters:  12/28/16 175 lb (79.4 kg)  11/11/16 180 lb (81.6 kg)  09/15/16 182 lb (82.6 kg)     Physical Exam  PERRL EOMI HEART IS RRR  LCTA MUSCULOSKELETAL reveals some paraspinous  muscle lumbar tenderness he is ambulating with an antalgic gait and does have some difficulty going from seated to standing. Otherwise no changes on lower extremity strength or function evaluation are noted.  Inspection of his upper extremity strength appears at baseline with good proximal distal strength. He does have some crepitus in the posterior cervical region with lateral rotational movement at the neck.  Labs  No results found for: HGBA1C Lab Results  Component Value Date   CREATININE 1.20 06/10/2016    -------------------------------------------------------------------------------------------------------------------- Lab Results  Component Value Date   CREATININE 1.20 06/10/2016     --------------------------------------------------------------------------------------------------------------------- Ct Chest W Contrast  Result Date: 06/10/2016 CLINICAL DATA:  71 y/o  M; pulmonary nodules. EXAM: CT CHEST WITH CONTRAST TECHNIQUE: Multidetector CT imaging of the chest was performed during intravenous contrast administration. CONTRAST:  100 cc Isovue 370 COMPARISON:  None. FINDINGS: Cardiovascular: Normal heart size. No pericardial effusion. Severe coronary artery calcification. Aortic valve replacement. Aortic atherosclerosis with moderate calcification. Normal caliber thoracic aorta and main pulmonary artery. Mediastinum/Nodes: No enlarged mediastinal, hilar, or axillary lymph nodes. Thyroid gland, trachea, and esophagus demonstrate no significant findings. Lungs/Pleura: 3 mm right lower lobe pulmonary nodule (series 5, image 69). Right upper lobe 5 mm pulmonary nodule (series 5, image 73). Several small nodules are present along the fissures and juxtapleural probably representing intrapulmonary lymph nodes. Tubular opacity in the left lower lobe (series 5, image 120) likely represents mucous plugging. Mild emphysema most pronounced in the right lung apex. Diffuse faint centrilobular ground-glass nodules seen. Upper Abdomen: Please refer to the concurrent CT of the abdomen and pelvis for further characterization. Musculoskeletal: Multiple endplate Schmorl's nodes. Healed median sternotomy. No acute osseous abnormality is evident. IMPRESSION: 1. Centrilobular ground-glass nodules probably represents respiratory bronchiolitis due to smoking. Differential includes infectious bronchiolitis or hypersensitivity pneumonitis. 2. 3-5 mm pulmonary nodules in the right upper lobe. Comparison with outside imaging is recommended to ensure stability. Otherwise follow-up per Fleischner society criteria: No follow-up needed if patient is low-risk (and has no known or suspected primary neoplasm).  Non-contrast chest CT can be considered in 12 months if patient is high-risk. This recommendation follows the consensus statement: Guidelines for Management of Incidental Pulmonary Nodules Detected on CT Images: From the Fleischner Society 2017; Radiology 2017; [id number]. 3. Mild emphysema greatest in right lung apex. 4. Severe coronary artery calcification. Electronically Signed   By: Kristine Garbe M.D.   On: 06/10/2016 15:51   Ct Angio Abd/pel W/ And/or W/o  Result Date: 06/10/2016 CLINICAL DATA:  Bilateral lower extremity pain and difficulty with ambulation. EXAM: CTA ABDOMEN AND PELVIS wITHOUT AND WITH CONTRAST TECHNIQUE: Multidetector CT imaging of the abdomen and pelvis was performed using the standard protocol during bolus administration of intravenous contrast. Multiplanar reconstructed images and MIPs were obtained and reviewed to evaluate the vascular anatomy. CONTRAST:  100 cc Isovue 370 COMPARISON:  None. FINDINGS: VASCULAR Aorta: The aorta is ectatic with a maximal AP diameter of 3.2 cm. This occurs below the renal arteries and contains some smooth chronic appearing mural thrombus. There is extensive irregular calcified plaque at the level of the renal arteries. There is also irregular plaque in the distal aorta with at least some degree of luminal narrowing. Celiac: Patent.  Branch vessels patent. SMA: Patent with mild irregular plaque at its origin. Renals: Single left and 2 right renal arteries are patent. IMA: Patent and diminutive. Inflow: There are extensive atherosclerotic calcifications and plaque in the right common iliac artery which occurs in a diffuse fashion.  The right external iliac artery is also diffusely diseased and diminutive. There is no obvious focal narrowing. Similarly, the left common iliac artery is diffusely diminutive with extensive calcified and irregular plaque throughout its course. Left external iliac artery has a similar appearance. No obvious focal  narrowing. Internal iliac arteries are moderately disease bilaterally. Right internal iliac artery is ectatic with a maximal diameter of 11 mm. Proximal Outflow: Bilateral superficial femoral arteries are occluded at their origins. There is moderate irregular and calcified plaque in both common femoral arteries. Veins: Hepatic, portal, splenic, and superior mesenteric veins are patent. Renal veins are patent. retroaortic left renal vein anatomy is noted. Common and external iliac veins are patent. Review of the MIP images confirms the above findings. NON-VASCULAR Lower chest: There is emphysema in the visualized lung bases. Hepatobiliary: Liver is within normal limits. Gallbladder is mildly distended with a maximal diameter of 5.1 cm. There is no wall thickening or gallstones. Pancreas: Unremarkable. Spleen: Unremarkable. Adrenals/Urinary Tract: Kidneys are within normal limits. Adrenal glands are markedly prominent bilaterally in a hyperplasia pattern without focal mass. Stomach/Bowel: Stomach is unremarkable. Normal appendix. There is no evidence of small-bowel obstruction. No obvious focal mass in the colon. Diverticulosis of the sigmoid colon is present. Sigmoid colon is decompressed. This artificially results in wall thickening. Lymphatic: No abnormal retroperitoneal adenopathy. Reproductive: Prostate is unremarkable. Bladder is within normal limits. Other: There is no free fluid. Right inguinal hernia contains adipose tissue in a tiny portion of the bladder. Musculoskeletal: T11 compression deformity involving the superior endplate has a chronic appearance. No vertebral compression deformity in the lumbar spine. Degenerative disc disease at L4-5 and L5-S1 is present. There is no destructive bone lesion. IMPRESSION: VASCULAR Abdominal aorta is ectatic with a maximal diameter of 3.2 cm. Recommend followup by ultrasound in 3 years. This recommendation follows ACR consensus guidelines: White Paper of the ACR  Incidental Findings Committee II on Vascular Findings. J Am Coll Radiol 2013; [phone] There are extensive atherosclerotic changes in the aorta and iliac arterial vasculature but no definitive focal stenosis to result in arterial insufficiency. Bilateral superficial femoral artery occlusion. NON-VASCULAR Mild gallbladder distention of unknown significance. No gallstones or mass is identified. Right inguinal hernia contains adipose tissue an a tiny portion of the bladder. Electronically Signed   By: Marybelle Killings M.D.   On: 06/10/2016 15:52     Assessment & Plan:   Renzo was seen today for back pain, hip pain, shoulder pain and neck pain.  Diagnoses and all orders for this visit:  Chronic pain syndrome  Facet arthritis of lumbar region ([place])  DDD (degenerative disc disease), lumbar  Chronic bilateral low back pain with right-sided sciatica  Chronic, continuous use of opioids  Chronic pain of both shoulders  Chronic left hip pain  Other orders -     Discontinue: fentaNYL (DURAGESIC - DOSED MCG/HR) 100 MCG/HR; Place 1 patch (100 mcg total) onto the skin every 3 (three) days. -     fentaNYL (DURAGESIC - DOSED MCG/HR) 100 MCG/HR; Place 1 patch (100 mcg total) onto the skin every 3 (three) days.        ----------------------------------------------------------------------------------------------------------------------  Problem List Items Addressed This Visit      Musculoskeletal and Integument   DDD (degenerative disc disease), lumbar   Relevant Medications   IBU 400 MG tablet   butalbital-aspirin-caffeine (FIORINAL) 50-325-40 MG capsule   fentaNYL (DURAGESIC - DOSED MCG/HR) 100 MCG/HR   Facet arthritis of lumbar region [facility])   Relevant Medications  IBU 400 MG tablet   butalbital-aspirin-caffeine (FIORINAL) 50-325-40 MG capsule   fentaNYL (DURAGESIC - DOSED MCG/HR) 100 MCG/HR    Other Visit Diagnoses    Chronic pain syndrome    -  Primary   Chronic bilateral low back  pain with right-sided sciatica       Relevant Medications   IBU 400 MG tablet   butalbital-aspirin-caffeine (FIORINAL) 50-325-40 MG capsule   diazepam (VALIUM) 5 MG tablet   fentaNYL (DURAGESIC - DOSED MCG/HR) 100 MCG/HR   Chronic, continuous use of opioids       Chronic pain of both shoulders       Chronic left hip pain            ----------------------------------------------------------------------------------------------------------------------  1. Chronic pain syndrome We'll continue her current regimen with the Duragesic patches written for the next 2 months. He is to continue at the 100 g dose every 3 days. We have also talked about implementing an NSAID for breakthrough pain for 5 days a week as tolerated. This may help with some of the cervicalgia symptoms he's experiencing as well.  2. Facet arthritis of lumbar region [facility]) As above  3. DDD (degenerative disc disease), lumbar As above  4. Chronic bilateral low back pain with right-sided sciatica As above  5. Chronic, continuous use of opioids We have reviewed the [facility] practitioner database information and it is appropriate.  6. Chronic pain of both shoulders As above  7. Chronic left hip pain     ----------------------------------------------------------------------------------------------------------------------  I am having Mr. Grenz maintain his docusate sodium, tamsulosin, gemfibrozil, lisinopril, metoprolol tartrate, butalbital-aspirin-caffeine, atorvastatin, busPIRone, citalopram, diazepam, aspirin, clindamycin, chlorhexidine, IBU, atorvastatin, butalbital-aspirin-caffeine, gemfibrozil, metoprolol tartrate, tamsulosin, diazepam, and fentaNYL.   Meds ordered this encounter  Medications  . chlorhexidine (PERIDEX) 0.12 % solution    Sig: SWISH MOUTH WITH 10 MILLILITERS AND SPIT OUT TWICE A DAY    Refill:  0  . IBU 400 MG tablet    Sig: take 1 tablet by mouth every 4 hours if needed for pain     Refill:  0  . atorvastatin (LIPITOR) 10 MG tablet    Sig: take 1 tablet by mouth once daily  . butalbital-aspirin-caffeine (FIORINAL) 50-325-40 MG capsule    Sig: Take by mouth.  Marland Kitchen gemfibrozil (LOPID) 600 MG tablet    Sig: take 1 tablet by mouth twice a day before meals  . metoprolol tartrate (LOPRESSOR) 25 MG tablet    Sig: take 1 tablet by mouth twice a day  . tamsulosin (FLOMAX) 0.4 MG CAPS capsule    Sig: take 1 capsule by mouth once daily 30 MIN AFTER SAME MEAL EACH DAY  . diazepam (VALIUM) 5 MG tablet    Sig: Take by mouth.  . DISCONTD: fentaNYL (DURAGESIC - DOSED MCG/HR) 100 MCG/HR    Sig: Place 1 patch (100 mcg total) onto the skin every 3 (three) days.    Dispense:  10 patch    Refill:  0    Do not fill until [id number]  . fentaNYL (DURAGESIC - DOSED MCG/HR) 100 MCG/HR    Sig: Place 1 patch (100 mcg total) onto the skin every 3 (three) days.    Dispense:  10 patch    Refill:  0    Do not fill until [id number]   Patient's Medications  New Prescriptions   No medications on file  Previous Medications   ASPIRIN 325 MG EC TABLET    Take 325 mg by mouth daily.  ATORVASTATIN (LIPITOR) 10 MG TABLET    Take 10 mg by mouth daily.    ATORVASTATIN (LIPITOR) 10 MG TABLET    take 1 tablet by mouth once daily   BUSPIRONE (BUSPAR) 15 MG TABLET    Take 15 mg by mouth 3 (three) times daily.    BUTALBITAL-ASPIRIN-CAFFEINE (FIORINAL) 50-325-40 MG CAPSULE    Take 1 capsule by mouth every 6 (six) hours as needed for headache.    BUTALBITAL-ASPIRIN-CAFFEINE (FIORINAL) 50-325-40 MG CAPSULE    Take by mouth.   CHLORHEXIDINE (PERIDEX) 0.12 % SOLUTION    SWISH MOUTH WITH 10 MILLILITERS AND SPIT OUT TWICE A DAY   CITALOPRAM (CELEXA) 20 MG TABLET    Take 20 mg by mouth daily.   CLINDAMYCIN (CLEOCIN) 300 MG CAPSULE    TAKE 2 CAPSULES BY MOUTH TO START ON DAY 1, THEN TAKE 1 CAPSULE  BY MOUTH THREE TIMES A DAY   DIAZEPAM (VALIUM) 5 MG TABLET    Take 5 mg by mouth every 6 (six) hours as needed.     DIAZEPAM (VALIUM) 5 MG TABLET    Take by mouth.   DOCUSATE SODIUM (COLACE) 100 MG CAPSULE    Take 100 mg by mouth 2 (two) times daily.   GEMFIBROZIL (LOPID) 600 MG TABLET    Take 600 mg by mouth 2 (two) times daily before a meal.   GEMFIBROZIL (LOPID) 600 MG TABLET    take 1 tablet by mouth twice a day before meals   IBU 400 MG TABLET    take 1 tablet by mouth every 4 hours if needed for pain   LISINOPRIL (PRINIVIL,ZESTRIL) 10 MG TABLET    Take 10 mg by mouth daily.   METOPROLOL TARTRATE (LOPRESSOR) 25 MG TABLET    Take 25 mg by mouth daily.   METOPROLOL TARTRATE (LOPRESSOR) 25 MG TABLET    take 1 tablet by mouth twice a day   TAMSULOSIN (FLOMAX) 0.4 MG CAPS CAPSULE    Take 0.4 mg by mouth daily.   TAMSULOSIN (FLOMAX) 0.4 MG CAPS CAPSULE    take 1 capsule by mouth once daily 30 MIN AFTER SAME MEAL EACH DAY  Modified Medications   Modified Medication Previous Medication   FENTANYL (DURAGESIC - DOSED MCG/HR) 100 MCG/HR fentaNYL (DURAGESIC - DOSED MCG/HR) 100 MCG/HR      Place 1 patch (100 mcg total) onto the skin every 3 (three) days.    Place 1 patch (100 mcg total) onto the skin every 3 (three) days.  Discontinued Medications   No medications on file   ----------------------------------------------------------------------------------------------------------------------  Follow-up: Return in about 2 months (around 02/27/2017) for evaluation, med refill.    Molli Barrows, MD

## 2017-02-21 ENCOUNTER — Ambulatory Visit: Payer: Medicare Other | Attending: Anesthesiology | Admitting: Anesthesiology

## 2017-02-21 ENCOUNTER — Encounter: Payer: Self-pay | Admitting: Anesthesiology

## 2017-02-21 VITALS — BP 141/56 | HR 72 | Temp 98.4°F | Resp 16 | Ht 68.0 in | Wt 177.0 lb

## 2017-02-21 DIAGNOSIS — M25552 Pain in left hip: Secondary | ICD-10-CM | POA: Diagnosis not present

## 2017-02-21 DIAGNOSIS — M4696 Unspecified inflammatory spondylopathy, lumbar region: Secondary | ICD-10-CM

## 2017-02-21 DIAGNOSIS — R51 Headache: Secondary | ICD-10-CM | POA: Diagnosis not present

## 2017-02-21 DIAGNOSIS — M5136 Other intervertebral disc degeneration, lumbar region: Secondary | ICD-10-CM | POA: Diagnosis not present

## 2017-02-21 DIAGNOSIS — M5441 Lumbago with sciatica, right side: Secondary | ICD-10-CM | POA: Insufficient documentation

## 2017-02-21 DIAGNOSIS — Z79891 Long term (current) use of opiate analgesic: Secondary | ICD-10-CM | POA: Insufficient documentation

## 2017-02-21 DIAGNOSIS — Z79899 Other long term (current) drug therapy: Secondary | ICD-10-CM | POA: Diagnosis not present

## 2017-02-21 DIAGNOSIS — G8929 Other chronic pain: Secondary | ICD-10-CM | POA: Diagnosis not present

## 2017-02-21 DIAGNOSIS — M79604 Pain in right leg: Secondary | ICD-10-CM | POA: Diagnosis not present

## 2017-02-21 DIAGNOSIS — M25551 Pain in right hip: Secondary | ICD-10-CM | POA: Insufficient documentation

## 2017-02-21 DIAGNOSIS — M25511 Pain in right shoulder: Secondary | ICD-10-CM

## 2017-02-21 DIAGNOSIS — F419 Anxiety disorder, unspecified: Secondary | ICD-10-CM | POA: Insufficient documentation

## 2017-02-21 DIAGNOSIS — M79605 Pain in left leg: Secondary | ICD-10-CM | POA: Insufficient documentation

## 2017-02-21 DIAGNOSIS — M542 Cervicalgia: Secondary | ICD-10-CM | POA: Insufficient documentation

## 2017-02-21 DIAGNOSIS — M25569 Pain in unspecified knee: Secondary | ICD-10-CM | POA: Insufficient documentation

## 2017-02-21 DIAGNOSIS — F1190 Opioid use, unspecified, uncomplicated: Secondary | ICD-10-CM

## 2017-02-21 DIAGNOSIS — G894 Chronic pain syndrome: Secondary | ICD-10-CM | POA: Diagnosis present

## 2017-02-21 DIAGNOSIS — M25512 Pain in left shoulder: Secondary | ICD-10-CM

## 2017-02-21 DIAGNOSIS — M47816 Spondylosis without myelopathy or radiculopathy, lumbar region: Secondary | ICD-10-CM

## 2017-02-21 DIAGNOSIS — Z7982 Long term (current) use of aspirin: Secondary | ICD-10-CM | POA: Diagnosis not present

## 2017-02-21 MED ORDER — OXYCODONE-ACETAMINOPHEN 7.5-325 MG PO TABS: 1.0000 | ORAL_TABLET | ORAL | 0 refills | Status: DC | PRN

## 2017-02-21 MED ORDER — FENTANYL 100 MCG/HR TD PT72: 100.0000 ug | MEDICATED_PATCH | TRANSDERMAL | 0 refills | Status: DC

## 2017-02-21 MED ORDER — OXYCODONE-ACETAMINOPHEN 7.5-325 MG PO TABS: 1.0000 | ORAL_TABLET | ORAL | 0 refills | Status: AC | PRN

## 2017-02-21 NOTE — Progress Notes (Signed)
Subjective:  Patient ID: [name], male    DOB: [birth year]  Age: 71 y.o. MRN: [id number]  CC: Hip Pain (bilateral); Back Pain; and Neck Pain   Procedure: None   [name] presents for reevaluation. He was last seen 2 months ago. [name] continues to have pain in the shoulders and low back hips and buttocks. He also continues to have lower extremity pain with knee pain. The quality characteristic and distribution have been the same but the pain is been particularly severe on day 3 of his Duragesic patch. He continues to take Valium and Fiorinal for his headaches and anxiety disorder as prescribed by Dr. Doy Hutching. He has been compliant with his regimen. He continues to have moderate to severe pain on day 3 with breakthrough on the Duragesic patch. Based on his narcotic assessment sheet he continues to derive good functional lifestyle improvement with his opioid medications. He denies any diverting or illicit use. He denies any significant side effects.  Outpatient Medications Prior to Visit  Medication Sig Dispense Refill  . aspirin 325 MG EC tablet Take 325 mg by mouth daily.    Marland Kitchen atorvastatin (LIPITOR) 10 MG tablet Take 10 mg by mouth daily.     . busPIRone (BUSPAR) 15 MG tablet Take 15 mg by mouth 3 (three) times daily.     . butalbital-aspirin-caffeine (FIORINAL) 50-325-40 MG capsule Take 1 capsule by mouth every 6 (six) hours as needed for headache.     . citalopram (CELEXA) 20 MG tablet Take 20 mg by mouth daily.  0  . diazepam (VALIUM) 5 MG tablet Take 5 mg by mouth every 6 (six) hours as needed.     . docusate sodium (COLACE) 100 MG capsule Take 100 mg by mouth 2 (two) times daily.    Marland Kitchen gemfibrozil (LOPID) 600 MG tablet Take 600 mg by mouth 2 (two) times daily before a meal.    . lisinopril (PRINIVIL,ZESTRIL) 10 MG tablet Take 10 mg by mouth daily.    . metoprolol tartrate (LOPRESSOR) 25 MG tablet Take 25 mg by mouth daily.    . tamsulosin (FLOMAX) 0.4 MG CAPS  capsule Take 0.4 mg by mouth daily.    . fentaNYL (DURAGESIC - DOSED MCG/HR) 100 MCG/HR Place 1 patch (100 mcg total) onto the skin every 3 (three) days. 10 patch 0  . atorvastatin (LIPITOR) 10 MG tablet take 1 tablet by mouth once daily    . butalbital-aspirin-caffeine (FIORINAL) 50-325-40 MG capsule Take by mouth.    . chlorhexidine (PERIDEX) 0.12 % solution SWISH MOUTH WITH 10 MILLILITERS AND SPIT OUT TWICE A DAY  0  . clindamycin (CLEOCIN) 300 MG capsule TAKE 2 CAPSULES BY MOUTH TO START ON DAY 1, THEN TAKE 1 CAPSULE  BY MOUTH THREE TIMES A DAY  0  . diazepam (VALIUM) 5 MG tablet Take by mouth.    Marland Kitchen gemfibrozil (LOPID) 600 MG tablet take 1 tablet by mouth twice a day before meals    . IBU 400 MG tablet take 1 tablet by mouth every 4 hours if needed for pain  0  . metoprolol tartrate (LOPRESSOR) 25 MG tablet take 1 tablet by mouth twice a day    . tamsulosin (FLOMAX) 0.4 MG CAPS capsule take 1 capsule by mouth once daily 30 MIN AFTER SAME MEAL EACH DAY     No facility-administered medications prior to visit.     Review of Systems CNS: No sedation or confusion Cardiac: No angina or palpitations  GI: No constipation or abdominal pain  Objective:  BP (!) 141/56   Pulse 72   Temp 98.4 F (36.9 C)   Resp 16   Ht 5\' 8"  (1.727 m)   Wt 177 lb (80.3 kg)   SpO2 100%   BMI 26.91 kg/m    BP Readings from Last 3 Encounters:  02/21/17 (!) 141/56  12/28/16 (!) 153/81  11/11/16 (!) 148/68     Wt Readings from Last 3 Encounters:  02/21/17 177 lb (80.3 kg)  12/28/16 175 lb (79.4 kg)  11/11/16 180 lb (81.6 kg)     Physical Exam Pt is alert and oriented PERRL EOMI HEART IS RRR no murmur or rub LCTA no wheezing or rales MUSCULOSKELETAL reveals some persistent paraspinous muscle tenderness but no overt trigger points. His muscle tone and bulk are at baseline and he continues to ambulate with an antalgic gait.  Labs  No results found for: HGBA1C Lab Results  Component Value Date    CREATININE 1.20 06/10/2016    -------------------------------------------------------------------------------------------------------------------- Lab Results  Component Value Date   CREATININE 1.20 06/10/2016    --------------------------------------------------------------------------------------------------------------------- Ct Chest W Contrast  Result Date: 06/10/2016 CLINICAL DATA:  71 y/o  M; pulmonary nodules. EXAM: CT CHEST WITH CONTRAST TECHNIQUE: Multidetector CT imaging of the chest was performed during intravenous contrast administration. CONTRAST:  100 cc Isovue 370 COMPARISON:  None. FINDINGS: Cardiovascular: Normal heart size. No pericardial effusion. Severe coronary artery calcification. Aortic valve replacement. Aortic atherosclerosis with moderate calcification. Normal caliber thoracic aorta and main pulmonary artery. Mediastinum/Nodes: No enlarged mediastinal, hilar, or axillary lymph nodes. Thyroid gland, trachea, and esophagus demonstrate no significant findings. Lungs/Pleura: 3 mm right lower lobe pulmonary nodule (series 5, image 69). Right upper lobe 5 mm pulmonary nodule (series 5, image 73). Several small nodules are present along the fissures and juxtapleural probably representing intrapulmonary lymph nodes. Tubular opacity in the left lower lobe (series 5, image 120) likely represents mucous plugging. Mild emphysema most pronounced in the right lung apex. Diffuse faint centrilobular ground-glass nodules seen. Upper Abdomen: Please refer to the concurrent CT of the abdomen and pelvis for further characterization. Musculoskeletal: Multiple endplate Schmorl's nodes. Healed median sternotomy. No acute osseous abnormality is evident. IMPRESSION: 1. Centrilobular ground-glass nodules probably represents respiratory bronchiolitis due to smoking. Differential includes infectious bronchiolitis or hypersensitivity pneumonitis. 2. 3-5 mm pulmonary nodules in the right upper lobe.  Comparison with outside imaging is recommended to ensure stability. Otherwise follow-up per Fleischner society criteria: No follow-up needed if patient is low-risk (and has no known or suspected primary neoplasm). Non-contrast chest CT can be considered in 12 months if patient is high-risk. This recommendation follows the consensus statement: Guidelines for Management of Incidental Pulmonary Nodules Detected on CT Images: From the Fleischner Society 2017; Radiology 2017; 284:228-243. 3. Mild emphysema greatest in right lung apex. 4. Severe coronary artery calcification. Electronically Signed   By: Kristine Garbe M.D.   On: 06/10/2016 15:51   Ct Angio Abd/pel W/ And/or W/o  Result Date: 06/10/2016 CLINICAL DATA:  Bilateral lower extremity pain and difficulty with ambulation. EXAM: CTA ABDOMEN AND PELVIS wITHOUT AND WITH CONTRAST TECHNIQUE: Multidetector CT imaging of the abdomen and pelvis was performed using the standard protocol during bolus administration of intravenous contrast. Multiplanar reconstructed images and MIPs were obtained and reviewed to evaluate the vascular anatomy. CONTRAST:  100 cc Isovue 370 COMPARISON:  None. FINDINGS: VASCULAR Aorta: The aorta is ectatic with a maximal AP diameter of 3.2 cm. This occurs below the  renal arteries and contains some smooth chronic appearing mural thrombus. There is extensive irregular calcified plaque at the level of the renal arteries. There is also irregular plaque in the distal aorta with at least some degree of luminal narrowing. Celiac: Patent.  Branch vessels patent. SMA: Patent with mild irregular plaque at its origin. Renals: Single left and 2 right renal arteries are patent. IMA: Patent and diminutive. Inflow: There are extensive atherosclerotic calcifications and plaque in the right common iliac artery which occurs in a diffuse fashion. The right external iliac artery is also diffusely diseased and diminutive. There is no obvious focal  narrowing. Similarly, the left common iliac artery is diffusely diminutive with extensive calcified and irregular plaque throughout its course. Left external iliac artery has a similar appearance. No obvious focal narrowing. Internal iliac arteries are moderately disease bilaterally. Right internal iliac artery is ectatic with a maximal diameter of 11 mm. Proximal Outflow: Bilateral superficial femoral arteries are occluded at their origins. There is moderate irregular and calcified plaque in both common femoral arteries. Veins: Hepatic, portal, splenic, and superior mesenteric veins are patent. Renal veins are patent. retroaortic left renal vein anatomy is noted. Common and external iliac veins are patent. Review of the MIP images confirms the above findings. NON-VASCULAR Lower chest: There is emphysema in the visualized lung bases. Hepatobiliary: Liver is within normal limits. Gallbladder is mildly distended with a maximal diameter of 5.1 cm. There is no wall thickening or gallstones. Pancreas: Unremarkable. Spleen: Unremarkable. Adrenals/Urinary Tract: Kidneys are within normal limits. Adrenal glands are markedly prominent bilaterally in a hyperplasia pattern without focal mass. Stomach/Bowel: Stomach is unremarkable. Normal appendix. There is no evidence of small-bowel obstruction. No obvious focal mass in the colon. Diverticulosis of the sigmoid colon is present. Sigmoid colon is decompressed. This artificially results in wall thickening. Lymphatic: No abnormal retroperitoneal adenopathy. Reproductive: Prostate is unremarkable. Bladder is within normal limits. Other: There is no free fluid. Right inguinal hernia contains adipose tissue in a tiny portion of the bladder. Musculoskeletal: T11 compression deformity involving the superior endplate has a chronic appearance. No vertebral compression deformity in the lumbar spine. Degenerative disc disease at L4-5 and L5-S1 is present. There is no destructive bone  lesion. IMPRESSION: VASCULAR Abdominal aorta is ectatic with a maximal diameter of 3.2 cm. Recommend followup by ultrasound in 3 years. This recommendation follows ACR consensus guidelines: White Paper of the ACR Incidental Findings Committee II on Vascular Findings. J Am Coll Radiol 2013; [phone] There are extensive atherosclerotic changes in the aorta and iliac arterial vasculature but no definitive focal stenosis to result in arterial insufficiency. Bilateral superficial femoral artery occlusion. NON-VASCULAR Mild gallbladder distention of unknown significance. No gallstones or mass is identified. Right inguinal hernia contains adipose tissue an a tiny portion of the bladder. Electronically Signed   By: Marybelle Killings M.D.   On: 06/10/2016 15:52     Assessment & Plan:   Sevin was seen today for hip pain, back pain and neck pain.  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 -     fentaNYL (  DURAGESIC - DOSED MCG/HR) 100 MCG/HR; Place 1 patch (100 mcg total) onto the skin every 3 (three) days. -     Discontinue: oxyCODONE-acetaminophen (PERCOCET) 7.5-325 MG tablet; Take 1 tablet by mouth every 4 (four) hours as needed for severe pain. -     oxyCODONE-acetaminophen (PERCOCET) 7.5-325 MG tablet; Take 1 tablet by mouth every 4 (four) hours as needed for severe pain.        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[place])   Relevant Medications   fentaNYL (DURAGESIC - DOSED MCG/HR) 100 MCG/HR   oxyCODONE-acetaminophen (PERCOCET) 7.5-325 MG tablet    Other Visit Diagnoses    Chronic pain syndrome    -  Primary   Chronic bilateral low back pain with right-sided sciatica       Relevant Medications   fentaNYL (DURAGESIC - DOSED MCG/HR) 100 MCG/HR   oxyCODONE-acetaminophen (PERCOCET) 7.5-325 MG tablet   Chronic pain of both shoulders       Chronic, continuous use of opioids       Chronic left hip pain       Relevant Medications   fentaNYL (DURAGESIC - DOSED MCG/HR) 100 MCG/HR   oxyCODONE-acetaminophen (PERCOCET) 7.5-325 MG tablet        ----------------------------------------------------------------------------------------------------------------------  1. Chronic pain syndrome We'll have him continue with his current medication regimen with Duragesic written for November 25 and December 24 for refills. Based on his breakthrough pain and going to allow him on Percocet 7.5 mg tablet to be taken 1 daily for breakthrough pain #20 given for October 29 and November 28. We have reviewed his [facility] practitioner database information and it is appropriate. He is to return to clinic in 2 months for reevaluation.  2. 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 DDD (degenerative disc disease), lumbar   4. Chronic bilateral low back pain with right-sided sciatica   5. Chronic pain of both shoulders   6. Chronic, continuous use of opioids   7. Chronic left hip pain     ----------------------------------------------------------------------------------------------------------------------  I have discontinued Mr. Artman clindamycin, chlorhexidine, and IBU. I am also having him maintain his docusate sodium, tamsulosin,  gemfibrozil, lisinopril, metoprolol tartrate, butalbital-aspirin-caffeine, atorvastatin, busPIRone, citalopram, diazepam, aspirin, lidocaine, fentaNYL, and oxyCODONE-acetaminophen.   Meds ordered this encounter  Medications  . lidocaine (XYLOCAINE) 5 % ointment    Sig: as directed.   Marland Kitchen DISCONTD: fentaNYL (DURAGESIC - DOSED MCG/HR) 100 MCG/HR    Sig: Place 1 patch (100 mcg total) onto the skin every 3 (three) days.    Dispense:  10 patch    Refill:  0    Do not fill until [id number]  . fentaNYL (DURAGESIC - DOSED MCG/HR) 100 MCG/HR    Sig: Place 1 patch (100 mcg total) onto the skin every 3 (three) days.    Dispense:  10 patch    Refill:  0    Do not fill until [id number]  . DISCONTD: oxyCODONE-acetaminophen (PERCOCET) 7.5-325 MG tablet    Sig: Take 1 tablet by mouth every 4 (four) hours as needed for severe pain.    Dispense:  30 tablet    Refill:  0  . oxyCODONE-acetaminophen (PERCOCET) 7.5-325 MG tablet    Sig: Take 1 tablet by mouth every  4 (four) hours as needed for severe pain.    Dispense:  30 tablet    Refill:  0    Do not fill until [id number]   Patient's Medications  New Prescriptions   OXYCODONE-ACETAMINOPHEN (PERCOCET) 7.5-325 MG TABLET    Take 1 tablet by mouth every 4 (four) hours as needed for severe pain.  Previous Medications   ASPIRIN 325 MG EC TABLET    Take 325 mg by mouth daily.   ATORVASTATIN (LIPITOR) 10 MG TABLET    Take 10 mg by mouth daily.    BUSPIRONE (BUSPAR) 15 MG TABLET    Take 15 mg by mouth 3 (three) times daily.    BUTALBITAL-ASPIRIN-CAFFEINE (FIORINAL) 50-325-40 MG CAPSULE    Take 1 capsule by mouth every 6 (six) hours as needed for headache.    CITALOPRAM (CELEXA) 20 MG TABLET    Take 20 mg by mouth daily.   DIAZEPAM (VALIUM) 5 MG TABLET    Take 5 mg by mouth every 6 (six) hours as needed.    DOCUSATE SODIUM (COLACE) 100 MG CAPSULE    Take 100 mg by mouth 2 (two) times daily.   GEMFIBROZIL (LOPID) 600 MG TABLET    Take 600 mg by mouth 2 (two) times  daily before a meal.   LIDOCAINE (XYLOCAINE) 5 % OINTMENT    as directed.    LISINOPRIL (PRINIVIL,ZESTRIL) 10 MG TABLET    Take 10 mg by mouth daily.   METOPROLOL TARTRATE (LOPRESSOR) 25 MG TABLET    Take 25 mg by mouth daily.   TAMSULOSIN (FLOMAX) 0.4 MG CAPS CAPSULE    Take 0.4 mg by mouth daily.  Modified Medications   Modified Medication Previous Medication   FENTANYL (DURAGESIC - DOSED MCG/HR) 100 MCG/HR fentaNYL (DURAGESIC - DOSED MCG/HR) 100 MCG/HR      Place 1 patch (100 mcg total) onto the skin every 3 (three) days.    Place 1 patch (100 mcg total) onto the skin every 3 (three) days.  Discontinued Medications   ATORVASTATIN (LIPITOR) 10 MG TABLET    take 1 tablet by mouth once daily   BUTALBITAL-ASPIRIN-CAFFEINE (FIORINAL) 50-325-40 MG CAPSULE    Take by mouth.   CHLORHEXIDINE (PERIDEX) 0.12 % SOLUTION    SWISH MOUTH WITH 10 MILLILITERS AND SPIT OUT TWICE A DAY   CLINDAMYCIN (CLEOCIN) 300 MG CAPSULE    TAKE 2 CAPSULES BY MOUTH TO START ON DAY 1, THEN TAKE 1 CAPSULE  BY MOUTH THREE TIMES A DAY   DIAZEPAM (VALIUM) 5 MG TABLET    Take by mouth.   GEMFIBROZIL (LOPID) 600 MG TABLET    take 1 tablet by mouth twice a day before meals   IBU 400 MG TABLET    take 1 tablet by mouth every 4 hours if needed for pain   METOPROLOL TARTRATE (LOPRESSOR) 25 MG TABLET    take 1 tablet by mouth twice a day   TAMSULOSIN (FLOMAX) 0.4 MG CAPS CAPSULE    take 1 capsule by mouth once daily 30 MIN AFTER SAME MEAL EACH DAY   ----------------------------------------------------------------------------------------------------------------------  Follow-up: Return in about 2 months (around 04/23/2017) for evaluation, med refill.    Molli Barrows, MD

## 2017-02-21 NOTE — Progress Notes (Addendum)
Nursing Pain Medication Assessment:  Safety precautions to be maintained throughout the outpatient stay will include: orient to surroundings, keep bed in low position, maintain call bell within reach at all times, provide assistance with transfer out of bed and ambulation.  Medication Inspection Compliance: Pill count conducted under aseptic conditions, in front of the patient. Neither the pills nor the bottle was removed from the patient's sight at any time. Once count was completed pills were immediately returned to the patient in their original bottle.  Medication: Fentanyl patch Pill/Patch Count: 9 of 10 pills remain patches Pill/Patch Appearance: Markings consistent with prescribed medication Bottle Appearance: Standard pharmacy container. Clearly labeled. Filled Date: 10 /25 / 2018 Last Medication intake:  Day before yesterday

## 2017-02-22 ENCOUNTER — Encounter: Payer: Medicare Other | Admitting: Anesthesiology

## 2017-04-22 ENCOUNTER — Ambulatory Visit: Payer: Medicare Other | Attending: Anesthesiology | Admitting: Anesthesiology

## 2017-04-22 ENCOUNTER — Other Ambulatory Visit: Payer: Self-pay

## 2017-04-22 ENCOUNTER — Encounter: Payer: Self-pay | Admitting: Anesthesiology

## 2017-04-22 VITALS — BP 149/59 | HR 63 | Temp 98.0°F | Resp 18 | Ht 69.0 in | Wt 177.0 lb

## 2017-04-22 DIAGNOSIS — M5441 Lumbago with sciatica, right side: Secondary | ICD-10-CM | POA: Diagnosis not present

## 2017-04-22 DIAGNOSIS — G8929 Other chronic pain: Secondary | ICD-10-CM

## 2017-04-22 DIAGNOSIS — M79605 Pain in left leg: Secondary | ICD-10-CM | POA: Diagnosis not present

## 2017-04-22 DIAGNOSIS — M25551 Pain in right hip: Secondary | ICD-10-CM | POA: Diagnosis not present

## 2017-04-22 DIAGNOSIS — R918 Other nonspecific abnormal finding of lung field: Secondary | ICD-10-CM | POA: Insufficient documentation

## 2017-04-22 DIAGNOSIS — M5136 Other intervertebral disc degeneration, lumbar region: Secondary | ICD-10-CM

## 2017-04-22 DIAGNOSIS — G894 Chronic pain syndrome: Secondary | ICD-10-CM

## 2017-04-22 DIAGNOSIS — M79604 Pain in right leg: Secondary | ICD-10-CM | POA: Diagnosis not present

## 2017-04-22 DIAGNOSIS — Z79891 Long term (current) use of opiate analgesic: Secondary | ICD-10-CM | POA: Diagnosis not present

## 2017-04-22 DIAGNOSIS — Z79899 Other long term (current) drug therapy: Secondary | ICD-10-CM | POA: Insufficient documentation

## 2017-04-22 DIAGNOSIS — M545 Low back pain: Secondary | ICD-10-CM | POA: Diagnosis present

## 2017-04-22 DIAGNOSIS — K4090 Unilateral inguinal hernia, without obstruction or gangrene, not specified as recurrent: Secondary | ICD-10-CM | POA: Diagnosis not present

## 2017-04-22 DIAGNOSIS — F1190 Opioid use, unspecified, uncomplicated: Secondary | ICD-10-CM

## 2017-04-22 DIAGNOSIS — Z952 Presence of prosthetic heart valve: Secondary | ICD-10-CM | POA: Insufficient documentation

## 2017-04-22 DIAGNOSIS — M25552 Pain in left hip: Secondary | ICD-10-CM | POA: Diagnosis present

## 2017-04-22 DIAGNOSIS — M4696 Unspecified inflammatory spondylopathy, lumbar region: Secondary | ICD-10-CM

## 2017-04-22 DIAGNOSIS — I700 Atherosclerosis of aorta: Secondary | ICD-10-CM | POA: Insufficient documentation

## 2017-04-22 DIAGNOSIS — M25511 Pain in right shoulder: Secondary | ICD-10-CM | POA: Diagnosis present

## 2017-04-22 DIAGNOSIS — M47816 Spondylosis without myelopathy or radiculopathy, lumbar region: Secondary | ICD-10-CM

## 2017-04-22 DIAGNOSIS — M25512 Pain in left shoulder: Secondary | ICD-10-CM | POA: Diagnosis not present

## 2017-04-22 DIAGNOSIS — K5900 Constipation, unspecified: Secondary | ICD-10-CM | POA: Diagnosis not present

## 2017-04-22 DIAGNOSIS — J439 Emphysema, unspecified: Secondary | ICD-10-CM | POA: Diagnosis not present

## 2017-04-22 DIAGNOSIS — M51369 Other intervertebral disc degeneration, lumbar region without mention of lumbar back pain or lower extremity pain: Secondary | ICD-10-CM

## 2017-04-22 DIAGNOSIS — Z7982 Long term (current) use of aspirin: Secondary | ICD-10-CM | POA: Diagnosis not present

## 2017-04-22 MED ORDER — FENTANYL 100 MCG/HR TD PT72: 100.0000 ug | MEDICATED_PATCH | TRANSDERMAL | 0 refills | Status: DC

## 2017-04-22 MED ORDER — OXYCODONE-ACETAMINOPHEN 7.5-325 MG PO TABS: 1.0000 | ORAL_TABLET | ORAL | 0 refills | Status: DC | PRN

## 2017-04-22 NOTE — Progress Notes (Signed)
Nursing Pain Medication Assessment:  Safety precautions to be maintained throughout the outpatient stay will include: orient to surroundings, keep bed in low position, maintain call bell within reach at all times, provide assistance with transfer out of bed and ambulation.  Medication Inspection Compliance: Pill count conducted under aseptic conditions, in front of the patient. Neither the pills nor the bottle was removed from the patient's sight at any time. Once count was completed pills were immediately returned to the patient in their original bottle.  Medication #1: Fentanyl patch Pill/Patch Count: 9 of 10 patches remain Pill/Patch Appearance: Markings consistent with prescribed medication Bottle Appearance: Standard pharmacy container. Clearly labeled. Filled Date: 64 / 24 / 2018 Last Medication intake:  Yesterday  Medication #2: Oxycodone IR Pill/Patch Count: 2 of 30 pills remain Pill/Patch Appearance: Markings consistent with prescribed medication Bottle Appearance: Standard pharmacy container. Clearly labeled. Filled Date: 26 / 29 / 2018 Last Medication intake:  Day before yesterday

## 2017-04-23 NOTE — Progress Notes (Signed)
Subjective:  Patient ID: [name], male    DOB: [birth year]  Age: 71 y.o. MRN: [id number]  CC: Back Pain (low and right); Hip Pain (bilateral); and Shoulder Pain (bilateral)   Procedure: None  HPI SHIVANK PINEDO presents for reevaluation.  He was last seen 2 months ago and has been doing well in the meantime.  He is taking his medications as prescribed and these seem to be giving him generally good relief.  Based on his narcotic assessment sheet he continues to derive good functional lifestyle improvement with the medications.  He has showed no evidence of diverting or illicit use and is using them appropriately.  No significant side effects are noted either.  He occasionally gets some constipation but this is generally well controlled with over-the-counter remedies.  The quality characteristic and distribution of his low back pain and leg pain are otherwise unchanged.  He is also having some intermittent shoulder pain which is worse with overuse.  Outpatient Medications Prior to Visit  Medication Sig Dispense Refill  . aspirin 325 MG EC tablet Take 325 mg by mouth daily.    Marland Kitchen atorvastatin (LIPITOR) 10 MG tablet Take 10 mg by mouth daily.     . busPIRone (BUSPAR) 15 MG tablet Take 15 mg by mouth 3 (three) times daily.     . butalbital-aspirin-caffeine (FIORINAL) 50-325-40 MG capsule Take 1 capsule by mouth every 6 (six) hours as needed for headache.     . citalopram (CELEXA) 20 MG tablet Take 20 mg by mouth daily.  0  . diazepam (VALIUM) 5 MG tablet Take 5 mg by mouth every 6 (six) hours as needed.     . docusate sodium (COLACE) 100 MG capsule Take 100 mg by mouth 2 (two) times daily.    Marland Kitchen gemfibrozil (LOPID) 600 MG tablet Take 600 mg by mouth 2 (two) times daily before a meal.    . lidocaine (XYLOCAINE) 5 % ointment as directed.     Marland Kitchen lisinopril (PRINIVIL,ZESTRIL) 10 MG tablet Take 10 mg by mouth daily.    . metoprolol tartrate (LOPRESSOR) 25 MG tablet Take 25 mg by mouth daily.     . tamsulosin (FLOMAX) 0.4 MG CAPS capsule Take 0.4 mg by mouth daily.    . fentaNYL (DURAGESIC - DOSED MCG/HR) 100 MCG/HR Place 1 patch (100 mcg total) onto the skin every 3 (three) days. 10 patch 0   No facility-administered medications prior to visit.     Review of Systems CNS: No confusion or sedation Cardiac: No angina or palpitations GI: No abdominal pain or constipation CNS: No sedation or confusion Cardiac: No angina or palpitations GI: No constipation or abdominal pain  Objective:  BP (!) 149/59   Pulse 63   Temp 98 F (36.7 C)   Resp 18   Ht 5\' 9"  (1.753 m)   Wt 177 lb (80.3 kg)   SpO2 99%   BMI 26.14 kg/m    BP Readings from Last 3 Encounters:  04/22/17 (!) 149/59  02/21/17 (!) 141/56  12/28/16 (!) 153/81     Wt Readings from Last 3 Encounters:  04/22/17 177 lb (80.3 kg)  02/21/17 177 lb (80.3 kg)  12/28/16 175 lb (79.4 kg)     Physical Exam Pt is alert and oriented PERRL EOMI HEART IS RRR no murmur or rub LCTA no wheezing or rales MUSCULOSKELETAL reveals some paraspinous muscle tenderness in the lumbar region he is ambulating with gait but seems to have maintenance of his  baseline lower extremity strength and function.  Labs  No results found for: HGBA1C Lab Results  Component Value Date   CREATININE 1.20 06/10/2016    -------------------------------------------------------------------------------------------------------------------- Lab Results  Component Value Date   CREATININE 1.20 06/10/2016    --------------------------------------------------------------------------------------------------------------------- Ct Chest W Contrast  Result Date: 06/10/2016 CLINICAL DATA:  71 y/o  M; pulmonary nodules. EXAM: CT CHEST WITH CONTRAST TECHNIQUE: Multidetector CT imaging of the chest was performed during intravenous contrast administration. CONTRAST:  100 cc Isovue 370 COMPARISON:  None. FINDINGS: Cardiovascular: Normal heart size. No  pericardial effusion. Severe coronary artery calcification. Aortic valve replacement. Aortic atherosclerosis with moderate calcification. Normal caliber thoracic aorta and main pulmonary artery. Mediastinum/Nodes: No enlarged mediastinal, hilar, or axillary lymph nodes. Thyroid gland, trachea, and esophagus demonstrate no significant findings. Lungs/Pleura: 3 mm right lower lobe pulmonary nodule (series 5, image 69). Right upper lobe 5 mm pulmonary nodule (series 5, image 73). Several small nodules are present along the fissures and juxtapleural probably representing intrapulmonary lymph nodes. Tubular opacity in the left lower lobe (series 5, image 120) likely represents mucous plugging. Mild emphysema most pronounced in the right lung apex. Diffuse faint centrilobular ground-glass nodules seen. Upper Abdomen: Please refer to the concurrent CT of the abdomen and pelvis for further characterization. Musculoskeletal: Multiple endplate Schmorl's nodes. Healed median sternotomy. No acute osseous abnormality is evident. IMPRESSION: 1. Centrilobular ground-glass nodules probably represents respiratory bronchiolitis due to smoking. Differential includes infectious bronchiolitis or hypersensitivity pneumonitis. 2. 3-5 mm pulmonary nodules in the right upper lobe. Comparison with outside imaging is recommended to ensure stability. Otherwise follow-up per Fleischner society criteria: No follow-up needed if patient is low-risk (and has no known or suspected primary neoplasm). Non-contrast chest CT can be considered in 12 months if patient is high-risk. This recommendation follows the consensus statement: Guidelines for Management of Incidental Pulmonary Nodules Detected on CT Images: From the Fleischner Society 2017; Radiology 2017; 284:228-243. 3. Mild emphysema greatest in right lung apex. 4. Severe coronary artery calcification. Electronically Signed   By: Kristine Garbe M.D.   On: 06/10/2016 15:51   Ct Angio  Abd/pel W/ And/or W/o  Result Date: 06/10/2016 CLINICAL DATA:  Bilateral lower extremity pain and difficulty with ambulation. EXAM: CTA ABDOMEN AND PELVIS wITHOUT AND WITH CONTRAST TECHNIQUE: Multidetector CT imaging of the abdomen and pelvis was performed using the standard protocol during bolus administration of intravenous contrast. Multiplanar reconstructed images and MIPs were obtained and reviewed to evaluate the vascular anatomy. CONTRAST:  100 cc Isovue 370 COMPARISON:  None. FINDINGS: VASCULAR Aorta: The aorta is ectatic with a maximal AP diameter of 3.2 cm. This occurs below the renal arteries and contains some smooth chronic appearing mural thrombus. There is extensive irregular calcified plaque at the level of the renal arteries. There is also irregular plaque in the distal aorta with at least some degree of luminal narrowing. Celiac: Patent.  Branch vessels patent. SMA: Patent with mild irregular plaque at its origin. Renals: Single left and 2 right renal arteries are patent. IMA: Patent and diminutive. Inflow: There are extensive atherosclerotic calcifications and plaque in the right common iliac artery which occurs in a diffuse fashion. The right external iliac artery is also diffusely diseased and diminutive. There is no obvious focal narrowing. Similarly, the left common iliac artery is diffusely diminutive with extensive calcified and irregular plaque throughout its course. Left external iliac artery has a similar appearance. No obvious focal narrowing. Internal iliac arteries are moderately disease bilaterally. Right internal iliac artery  is ectatic with a maximal diameter of 11 mm. Proximal Outflow: Bilateral superficial femoral arteries are occluded at their origins. There is moderate irregular and calcified plaque in both common femoral arteries. Veins: Hepatic, portal, splenic, and superior mesenteric veins are patent. Renal veins are patent. retroaortic left renal vein anatomy is noted.  Common and external iliac veins are patent. Review of the MIP images confirms the above findings. NON-VASCULAR Lower chest: There is emphysema in the visualized lung bases. Hepatobiliary: Liver is within normal limits. Gallbladder is mildly distended with a maximal diameter of 5.1 cm. There is no wall thickening or gallstones. Pancreas: Unremarkable. Spleen: Unremarkable. Adrenals/Urinary Tract: Kidneys are within normal limits. Adrenal glands are markedly prominent bilaterally in a hyperplasia pattern without focal mass. Stomach/Bowel: Stomach is unremarkable. Normal appendix. There is no evidence of small-bowel obstruction. No obvious focal mass in the colon. Diverticulosis of the sigmoid colon is present. Sigmoid colon is decompressed. This artificially results in wall thickening. Lymphatic: No abnormal retroperitoneal adenopathy. Reproductive: Prostate is unremarkable. Bladder is within normal limits. Other: There is no free fluid. Right inguinal hernia contains adipose tissue in a tiny portion of the bladder. Musculoskeletal: T11 compression deformity involving the superior endplate has a chronic appearance. No vertebral compression deformity in the lumbar spine. Degenerative disc disease at L4-5 and L5-S1 is present. There is no destructive bone lesion. IMPRESSION: VASCULAR Abdominal aorta is ectatic with a maximal diameter of 3.2 cm. Recommend followup by ultrasound in 3 years. This recommendation follows ACR consensus guidelines: White Paper of the ACR Incidental Findings Committee II on Vascular Findings. J Am Coll Radiol 2013; [phone] There are extensive atherosclerotic changes in the aorta and iliac arterial vasculature but no definitive focal stenosis to result in arterial insufficiency. Bilateral superficial femoral artery occlusion. NON-VASCULAR Mild gallbladder distention of unknown significance. No gallstones or mass is identified. Right inguinal hernia contains adipose tissue an a tiny portion of  the bladder. Electronically Signed   By: Marybelle Killings M.D.   On: 06/10/2016 15:52     Assessment & Plan:   Zuriel was seen today for back pain, hip pain and shoulder pain.  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 -     fentaNYL (DURAGESIC - DOSED MCG/HR) 100 MCG/HR; Place 1 patch (100 mcg total) onto the skin every 3 (three) days. -     Discontinue: oxyCODONE-acetaminophen (PERCOCET) 7.5-325 MG tablet; Take 1 tablet by mouth every 4 (four) hours as needed for severe pain. -     oxyCODONE-acetaminophen (PERCOCET) 7.5-325 MG tablet; Take 1 tablet by mouth every 4 (four) hours as needed for severe pain.        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[place])   Relevant Medications   fentaNYL (DURAGESIC - DOSED MCG/HR) 100 MCG/HR   oxyCODONE-acetaminophen (PERCOCET) 7.5-325 MG tablet  Other Visit Diagnoses    Chronic pain syndrome    -  Primary   Chronic bilateral low back pain with right-sided sciatica       Relevant Medications   fentaNYL (DURAGESIC - DOSED MCG/HR) 100 MCG/HR   oxyCODONE-acetaminophen (PERCOCET) 7.5-325 MG tablet   Chronic pain of both shoulders       Chronic, continuous use of opioids       Chronic left hip pain        Relevant Medications   fentaNYL (DURAGESIC - DOSED MCG/HR) 100 MCG/HR   oxyCODONE-acetaminophen (PERCOCET) 7.5-325 MG tablet        ----------------------------------------------------------------------------------------------------------------------  1. Chronic pain syndrome We will continue his current regimen.  I am going to give him refills on his Duragesic patch for January 23 for over 22nd.  He also is will be getting a refill for breakthrough medication on the oxycodone 7.5 mg tablets for December 28 and January 27 to be taken 1 tablet/day if needed for breakthrough pain.  We reviewed the [facility] practitioner database information and it is appropriate.  He is to return to clinic in 2 months.  2. Facet arthritis of lumbar region [facility]) Continue with core stretching strengthening exercises and ambulation as tolerated  3. DDD (degenerative disc disease), lumbar   4. Chronic bilateral low back pain with right-sided sciatica   5. Chronic pain of both shoulders Tinea follow-up with Dr. Doy Hutching and his primary care physicians for his baseline medical care as well.   6. Chronic, continuous use of opioids   7. Chronic left hip pain     ----------------------------------------------------------------------------------------------------------------------  I am having Kendarious B. Tonnesen maintain his docusate sodium, tamsulosin, gemfibrozil, lisinopril, metoprolol tartrate, butalbital-aspirin-caffeine, atorvastatin, busPIRone, citalopram, diazepam, aspirin, lidocaine, fentaNYL, and oxyCODONE-acetaminophen.   Meds ordered this encounter  Medications  . DISCONTD: fentaNYL (DURAGESIC - DOSED MCG/HR) 100 MCG/HR    Sig: Place 1 patch (100 mcg total) onto the skin every 3 (three) days.    Dispense:  10 patch    Refill:  0    Do not fill until [id number]  . fentaNYL (DURAGESIC - DOSED MCG/HR) 100 MCG/HR    Sig: Place 1 patch (100 mcg total) onto the skin every 3 (three) days.     Dispense:  10 patch    Refill:  0    Do not fill until [id number]  . DISCONTD: oxyCODONE-acetaminophen (PERCOCET) 7.5-325 MG tablet    Sig: Take 1 tablet by mouth every 4 (four) hours as needed for severe pain.    Dispense:  30 tablet    Refill:  0    Do not fill until [id number]  . oxyCODONE-acetaminophen (PERCOCET) 7.5-325 MG tablet    Sig: Take 1 tablet by mouth every 4 (four) hours as needed for severe pain.    Dispense:  30 tablet    Refill:  0    Do not fill until [id number]     Medication List        Accurate as of 04/22/17 11:59 PM. Always use your most recent med list.          aspirin 325 MG EC tablet   atorvastatin 10 MG tablet Commonly known as:  LIPITOR   busPIRone 15 MG tablet Commonly known as:  BUSPAR   butalbital-aspirin-caffeine 50-325-40 MG capsule Commonly known as:  FIORINAL   citalopram 20 MG tablet Commonly known as:  CELEXA   diazepam 5 MG tablet Commonly known as:  VALIUM   docusate sodium 100  MG capsule Commonly known as:  COLACE   fentaNYL 100 MCG/HR Commonly known as:  DURAGESIC - dosed mcg/hr Place 1 patch (100 mcg total) onto the skin every 3 (three) days.   gemfibrozil 600 MG tablet Commonly known as:  LOPID   lidocaine 5 % ointment Commonly known as:  XYLOCAINE   lisinopril 10 MG tablet Commonly known as:  PRINIVIL,ZESTRIL   metoprolol tartrate 25 MG tablet Commonly known as:  LOPRESSOR   oxyCODONE-acetaminophen 7.5-325 MG tablet Commonly known as:  PERCOCET Take 1 tablet by mouth every 4 (four) hours as needed for severe pain.   tamsulosin 0.4 MG Caps capsule Commonly known as:  FLOMAX       Where to Get Your Medications    You can get these medications from any pharmacy   Bring a paper prescription for each of these medications  fentaNYL 100 MCG/HR  oxyCODONE-acetaminophen 7.5-325 MG tablet     ----------------------------------------------------------------------------------------------------------------------  Follow-up: No Follow-up on file.    Molli Barrows, MD

## 2017-06-14 ENCOUNTER — Encounter: Payer: Self-pay | Admitting: Anesthesiology

## 2017-06-14 ENCOUNTER — Ambulatory Visit: Payer: Medicare Other | Attending: Anesthesiology | Admitting: Anesthesiology

## 2017-06-14 ENCOUNTER — Other Ambulatory Visit: Payer: Self-pay

## 2017-06-14 VITALS — BP 142/52 | HR 81 | Temp 98.3°F | Resp 16 | Ht 68.0 in | Wt 177.0 lb

## 2017-06-14 DIAGNOSIS — G894 Chronic pain syndrome: Secondary | ICD-10-CM

## 2017-06-14 DIAGNOSIS — M5106 Intervertebral disc disorders with myelopathy, lumbar region: Secondary | ICD-10-CM | POA: Insufficient documentation

## 2017-06-14 DIAGNOSIS — M4716 Other spondylosis with myelopathy, lumbar region: Secondary | ICD-10-CM | POA: Diagnosis not present

## 2017-06-14 DIAGNOSIS — Z79899 Other long term (current) drug therapy: Secondary | ICD-10-CM | POA: Insufficient documentation

## 2017-06-14 DIAGNOSIS — M25511 Pain in right shoulder: Secondary | ICD-10-CM

## 2017-06-14 DIAGNOSIS — Z79891 Long term (current) use of opiate analgesic: Secondary | ICD-10-CM | POA: Insufficient documentation

## 2017-06-14 DIAGNOSIS — M4696 Unspecified inflammatory spondylopathy, lumbar region: Secondary | ICD-10-CM | POA: Diagnosis not present

## 2017-06-14 DIAGNOSIS — M25551 Pain in right hip: Secondary | ICD-10-CM | POA: Diagnosis present

## 2017-06-14 DIAGNOSIS — G8929 Other chronic pain: Secondary | ICD-10-CM

## 2017-06-14 DIAGNOSIS — M5441 Lumbago with sciatica, right side: Secondary | ICD-10-CM | POA: Insufficient documentation

## 2017-06-14 DIAGNOSIS — M5136 Other intervertebral disc degeneration, lumbar region: Secondary | ICD-10-CM | POA: Diagnosis not present

## 2017-06-14 DIAGNOSIS — M51369 Other intervertebral disc degeneration, lumbar region without mention of lumbar back pain or lower extremity pain: Secondary | ICD-10-CM

## 2017-06-14 DIAGNOSIS — Z7982 Long term (current) use of aspirin: Secondary | ICD-10-CM | POA: Diagnosis not present

## 2017-06-14 DIAGNOSIS — F1190 Opioid use, unspecified, uncomplicated: Secondary | ICD-10-CM

## 2017-06-14 DIAGNOSIS — M25512 Pain in left shoulder: Secondary | ICD-10-CM | POA: Diagnosis not present

## 2017-06-14 DIAGNOSIS — M25552 Pain in left hip: Secondary | ICD-10-CM

## 2017-06-14 DIAGNOSIS — M47816 Spondylosis without myelopathy or radiculopathy, lumbar region: Secondary | ICD-10-CM

## 2017-06-14 MED ORDER — FENTANYL 100 MCG/HR TD PT72: 100.0000 ug | MEDICATED_PATCH | TRANSDERMAL | 0 refills | Status: DC

## 2017-06-14 MED ORDER — OXYCODONE-ACETAMINOPHEN 7.5-325 MG PO TABS: 1.0000 | ORAL_TABLET | ORAL | 0 refills | Status: DC | PRN

## 2017-06-14 NOTE — Patient Instructions (Signed)
You have been given 2 scripts for Fentanyl patch and 2 scripts for oxycodone today

## 2017-06-14 NOTE — Progress Notes (Signed)
Nursing Pain Medication Assessment:  Safety precautions to be maintained throughout the outpatient stay will include: orient to surroundings, keep bed in low position, maintain call bell within reach at all times, provide assistance with transfer out of bed and ambulation.  Medication Inspection Compliance: Pill count conducted under aseptic conditions, in front of the patient. Neither the pills nor the bottle was removed from the patient's sight at any time. Once count was completed pills were immediately returned to the patient in their original bottle.  Medication #1: Oxycodone/APAP Pill/Patch Count: 0.5 of 30 pills remain Pill/Patch Appearance: Markings consistent with prescribed medication Bottle Appearance: Standard pharmacy container. Clearly labeled. Filled Date: 01 / 27/ 2019 Last Medication intake:  Today  Medication #2: Fentanyl patch Pill/Patch Count: 1 of 10 patches remain Pill/Patch Appearance: Markings consistent with prescribed medication Bottle Appearance: Standard pharmacy container. Clearly labeled. Filled Date:01 / 22/ 2019 Last Medication intake:  Yesterday

## 2017-06-15 NOTE — Progress Notes (Signed)
Subjective:  Patient ID: [name], male    DOB: [birth year]  Age: 72 y.o. MRN: [id number]  CC: Hip Pain (bilateral)   Procedure: None  HPI [name] presents for reevaluation.  He was last seen 2 months ago and continues to have considerable pain in the low back right greater than left hip and lower extremities.  The quality characteristic distribution of the pain are otherwise unchanged though.  He is taking his medications as prescribed and these continue to work well for him.  Based on his narcotic assessment sheet he continues to derive good functional lifestyle improvement with his medicines.  He still has some breakthrough pain despite the Duragesic patch for which she takes Percocet approximately once a day.  He has tried to wean his opioid medications but continues to have severe pain from his osteoporosis and facet issues.  Continues to try to stay active as best possible.  Otherwise he is in his usual state of health.  Outpatient Medications Prior to Visit  Medication Sig Dispense Refill  . aspirin 325 MG EC tablet Take 325 mg by mouth daily.    Marland Kitchen atorvastatin (LIPITOR) 10 MG tablet Take 10 mg by mouth daily.     . busPIRone (BUSPAR) 15 MG tablet Take 15 mg by mouth 3 (three) times daily.     . butalbital-aspirin-caffeine (FIORINAL) 50-325-40 MG capsule Take 1 capsule by mouth every 6 (six) hours as needed for headache.     . citalopram (CELEXA) 20 MG tablet Take 20 mg by mouth daily.  0  . diazepam (VALIUM) 5 MG tablet Take 5 mg by mouth every 6 (six) hours as needed.     . docusate sodium (COLACE) 100 MG capsule Take 100 mg by mouth 2 (two) times daily.    Marland Kitchen gemfibrozil (LOPID) 600 MG tablet Take 600 mg by mouth 2 (two) times daily before a meal.    . lidocaine (XYLOCAINE) 5 % ointment as directed.     Marland Kitchen lisinopril (PRINIVIL,ZESTRIL) 10 MG tablet Take 10 mg by mouth daily.    . metoprolol tartrate (LOPRESSOR) 25 MG tablet Take 25 mg by mouth daily.    .  tamsulosin (FLOMAX) 0.4 MG CAPS capsule Take 0.4 mg by mouth daily.    . fentaNYL (DURAGESIC - DOSED MCG/HR) 100 MCG/HR Place 1 patch (100 mcg total) onto the skin every 3 (three) days. 10 patch 0  . oxyCODONE-acetaminophen (PERCOCET) 7.5-325 MG tablet Take 1 tablet by mouth every 4 (four) hours as needed for severe pain. 30 tablet 0   No facility-administered medications prior to visit.     Review of Systems CNS: No confusion or sedation Cardiac: No angina or palpitations GI: No abdominal pain or constipation Constitutional: No nausea vomiting fevers or chills  Objective:  BP (!) 142/52   Pulse 81   Temp 98.3 F (36.8 C)   Resp 16   Ht 5\' 8"  (1.727 m)   Wt 177 lb (80.3 kg)   SpO2 100%   BMI 26.91 kg/m    BP Readings from Last 3 Encounters:  06/14/17 (!) 142/52  04/22/17 (!) 149/59  02/21/17 (!) 141/56     Wt Readings from Last 3 Encounters:  06/14/17 177 lb (80.3 kg)  04/22/17 177 lb (80.3 kg)  02/21/17 177 lb (80.3 kg)     Physical Exam Pt is alert and oriented PERRL EOMI HEART IS RRR no murmur or rub LCTA no wheezing or rales MUSCULOSKELETAL reveals some paraspinous muscle  tenderness but no overt trigger points.  He still has pain with extension and ambulates with a mildly antalgic gait.  His muscle tone and bulk are at baseline without significant change from previous examination.  Labs  No results found for: HGBA1C Lab Results  Component Value Date   CREATININE 1.20 06/10/2016    -------------------------------------------------------------------------------------------------------------------- Lab Results  Component Value Date   CREATININE 1.20 06/10/2016    --------------------------------------------------------------------------------------------------------------------- Ct Chest W Contrast  Result Date: 06/10/2016 CLINICAL DATA:  72 y/o  M; pulmonary nodules. EXAM: CT CHEST WITH CONTRAST TECHNIQUE: Multidetector CT imaging of the chest was  performed during intravenous contrast administration. CONTRAST:  100 cc Isovue 370 COMPARISON:  None. FINDINGS: Cardiovascular: Normal heart size. No pericardial effusion. Severe coronary artery calcification. Aortic valve replacement. Aortic atherosclerosis with moderate calcification. Normal caliber thoracic aorta and main pulmonary artery. Mediastinum/Nodes: No enlarged mediastinal, hilar, or axillary lymph nodes. Thyroid gland, trachea, and esophagus demonstrate no significant findings. Lungs/Pleura: 3 mm right lower lobe pulmonary nodule (series 5, image 69). Right upper lobe 5 mm pulmonary nodule (series 5, image 73). Several small nodules are present along the fissures and juxtapleural probably representing intrapulmonary lymph nodes. Tubular opacity in the left lower lobe (series 5, image 120) likely represents mucous plugging. Mild emphysema most pronounced in the right lung apex. Diffuse faint centrilobular ground-glass nodules seen. Upper Abdomen: Please refer to the concurrent CT of the abdomen and pelvis for further characterization. Musculoskeletal: Multiple endplate Schmorl's nodes. Healed median sternotomy. No acute osseous abnormality is evident. IMPRESSION: 1. Centrilobular ground-glass nodules probably represents respiratory bronchiolitis due to smoking. Differential includes infectious bronchiolitis or hypersensitivity pneumonitis. 2. 3-5 mm pulmonary nodules in the right upper lobe. Comparison with outside imaging is recommended to ensure stability. Otherwise follow-up per Fleischner society criteria: No follow-up needed if patient is low-risk (and has no known or suspected primary neoplasm). Non-contrast chest CT can be considered in 12 months if patient is high-risk. This recommendation follows the consensus statement: Guidelines for Management of Incidental Pulmonary Nodules Detected on CT Images: From the Fleischner Society 2017; Radiology 2017; 284:228-243. 3. Mild emphysema greatest in  right lung apex. 4. Severe coronary artery calcification. Electronically Signed   By: Kristine Garbe M.D.   On: 06/10/2016 15:51   Ct Angio Abd/pel W/ And/or W/o  Result Date: 06/10/2016 CLINICAL DATA:  Bilateral lower extremity pain and difficulty with ambulation. EXAM: CTA ABDOMEN AND PELVIS wITHOUT AND WITH CONTRAST TECHNIQUE: Multidetector CT imaging of the abdomen and pelvis was performed using the standard protocol during bolus administration of intravenous contrast. Multiplanar reconstructed images and MIPs were obtained and reviewed to evaluate the vascular anatomy. CONTRAST:  100 cc Isovue 370 COMPARISON:  None. FINDINGS: VASCULAR Aorta: The aorta is ectatic with a maximal AP diameter of 3.2 cm. This occurs below the renal arteries and contains some smooth chronic appearing mural thrombus. There is extensive irregular calcified plaque at the level of the renal arteries. There is also irregular plaque in the distal aorta with at least some degree of luminal narrowing. Celiac: Patent.  Branch vessels patent. SMA: Patent with mild irregular plaque at its origin. Renals: Single left and 2 right renal arteries are patent. IMA: Patent and diminutive. Inflow: There are extensive atherosclerotic calcifications and plaque in the right common iliac artery which occurs in a diffuse fashion. The right external iliac artery is also diffusely diseased and diminutive. There is no obvious focal narrowing. Similarly, the left common iliac artery is diffusely diminutive with extensive calcified  and irregular plaque throughout its course. Left external iliac artery has a similar appearance. No obvious focal narrowing. Internal iliac arteries are moderately disease bilaterally. Right internal iliac artery is ectatic with a maximal diameter of 11 mm. Proximal Outflow: Bilateral superficial femoral arteries are occluded at their origins. There is moderate irregular and calcified plaque in both common femoral  arteries. Veins: Hepatic, portal, splenic, and superior mesenteric veins are patent. Renal veins are patent. retroaortic left renal vein anatomy is noted. Common and external iliac veins are patent. Review of the MIP images confirms the above findings. NON-VASCULAR Lower chest: There is emphysema in the visualized lung bases. Hepatobiliary: Liver is within normal limits. Gallbladder is mildly distended with a maximal diameter of 5.1 cm. There is no wall thickening or gallstones. Pancreas: Unremarkable. Spleen: Unremarkable. Adrenals/Urinary Tract: Kidneys are within normal limits. Adrenal glands are markedly prominent bilaterally in a hyperplasia pattern without focal mass. Stomach/Bowel: Stomach is unremarkable. Normal appendix. There is no evidence of small-bowel obstruction. No obvious focal mass in the colon. Diverticulosis of the sigmoid colon is present. Sigmoid colon is decompressed. This artificially results in wall thickening. Lymphatic: No abnormal retroperitoneal adenopathy. Reproductive: Prostate is unremarkable. Bladder is within normal limits. Other: There is no free fluid. Right inguinal hernia contains adipose tissue in a tiny portion of the bladder. Musculoskeletal: T11 compression deformity involving the superior endplate has a chronic appearance. No vertebral compression deformity in the lumbar spine. Degenerative disc disease at L4-5 and L5-S1 is present. There is no destructive bone lesion. IMPRESSION: VASCULAR Abdominal aorta is ectatic with a maximal diameter of 3.2 cm. Recommend followup by ultrasound in 3 years. This recommendation follows ACR consensus guidelines: White Paper of the ACR Incidental Findings Committee II on Vascular Findings. J Am Coll Radiol 2013; [id number] There are extensive atherosclerotic changes in the aorta and iliac arterial vasculature but no definitive focal stenosis to result in arterial insufficiency. Bilateral superficial femoral artery occlusion. NON-VASCULAR  Mild gallbladder distention of unknown significance. No gallstones or mass is identified. Right inguinal hernia contains adipose tissue an a tiny portion of the bladder. Electronically Signed   By: Marybelle Killings M.D.   On: 06/10/2016 15:52     Assessment & Plan:   Masao was seen today for hip pain.  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 -     fentaNYL (DURAGESIC - DOSED MCG/HR) 100 MCG/HR; Place 1 patch (100 mcg total) onto the skin every 3 (three) days. -     Discontinue: oxyCODONE-acetaminophen (PERCOCET) 7.5-325 MG tablet; Take 1 tablet by mouth every 4 (four) hours as needed for severe pain. -     oxyCODONE-acetaminophen (PERCOCET) 7.5-325 MG tablet; Take 1 tablet by mouth every 4 (four) hours as needed for severe pain.        ----------------------------------------------------------------------------------------------------------------------  Problem List Items Addressed This Visit      Unprioritized   DDD (degenerative disc disease), lumbar   Relevant Medications   fentaNYL (DURAGESIC - DOSED MCG/HR) 100 MCG/HR   oxyCODONE-acetaminophen (PERCOCET) 7.5-325 MG tablet  Facet arthritis of lumbar region [facility])   Relevant Medications   fentaNYL (DURAGESIC - DOSED MCG/HR) 100 MCG/HR   oxyCODONE-acetaminophen (PERCOCET) 7.5-325 MG tablet    Other Visit Diagnoses    Chronic pain syndrome    -  Primary   Chronic bilateral low back pain with right-sided sciatica       Relevant Medications   fentaNYL (DURAGESIC - DOSED MCG/HR) 100  MCG/HR   oxyCODONE-acetaminophen (PERCOCET) 7.5-325 MG tablet   Chronic pain of both shoulders       Chronic, continuous use of opioids       Chronic left hip pain       Relevant Medications   fentaNYL (DURAGESIC - DOSED MCG/HR) 100 MCG/HR   oxyCODONE-acetaminophen (PERCOCET) 7.5-325 MG tablet   Spondylosis, lumbar, with myelopathy            ----------------------------------------------------------------------------------------------------------------------  1. Chronic pain syndrome We reviewed the risks and benefits of chronic opioid administration.  He has been using a slightly increased dose of Percocet secondary to some recent exacerbation of pain.  We will refill his Percocet today with a early refill by approximately 4 days and we have talked about this as well.  I am going to increase his Percocet to 40 tablets/month for breakthrough pain.  He denies any significant side effects with his medications and based on his narcotic assessment sheet he continues to do well with him.  We have also reviewed his [facility] practitioner database information and it is appropriate.  Refills were given for February 22 March 24 and April 23.  This was for the Duragesic and refills were given for his Percocet for today and March 21 for 40 tablets  2. Facet arthritis of lumbar region ([place])   3. DDD (degenerative disc disease), lumbar   4. Chronic bilateral low back pain with right-sided sciatica   5. Chronic pain of both shoulders   6. Chronic, continuous use of opioids As above  7. Chronic left hip pain   8. Spondylosis, lumbar, with myelopathy     ----------------------------------------------------------------------------------------------------------------------  I am having Kaci B. Scoggin maintain his docusate sodium, tamsulosin, gemfibrozil, lisinopril, metoprolol tartrate, butalbital-aspirin-caffeine, atorvastatin, busPIRone, citalopram, diazepam, aspirin, lidocaine,  fentaNYL, and oxyCODONE-acetaminophen.   Meds ordered this encounter  Medications  . DISCONTD: fentaNYL (DURAGESIC - DOSED MCG/HR) 100 MCG/HR    Sig: Place 1 patch (100 mcg total) onto the skin every 3 (three) days.    Dispense:  10 patch    Refill:  0    Do not fill until [id number]  . fentaNYL (DURAGESIC - DOSED MCG/HR) 100 MCG/HR    Sig: Place 1 patch (100 mcg total) onto the skin every 3 (three) days.    Dispense:  10 patch    Refill:  0    Do not fill until [id number]  . DISCONTD: oxyCODONE-acetaminophen (PERCOCET) 7.5-325 MG tablet    Sig: Take 1 tablet by mouth every 4 (four) hours as needed for severe pain.    Dispense:  30 tablet    Refill:  0  . oxyCODONE-acetaminophen (PERCOCET) 7.5-325 MG tablet    Sig: Take 1 tablet by mouth every 4 (four) hours as needed for severe pain.    Dispense:  40 tablet    Refill:  0    Do not fill until [id number]   Patient's Medications  New Prescriptions   No medications on file  Previous Medications   ASPIRIN 325 MG EC TABLET    Take 325 mg by mouth  daily.   ATORVASTATIN (LIPITOR) 10 MG TABLET    Take 10 mg by mouth daily.    BUSPIRONE (BUSPAR) 15 MG TABLET    Take 15 mg by mouth 3 (three) times daily.    BUTALBITAL-ASPIRIN-CAFFEINE (FIORINAL) 50-325-40 MG CAPSULE    Take 1 capsule by mouth every 6 (six) hours as needed for headache.    CITALOPRAM (CELEXA) 20 MG TABLET    Take 20 mg by mouth daily.   DIAZEPAM (VALIUM) 5 MG TABLET    Take 5 mg by mouth every 6 (six) hours as needed.    DOCUSATE SODIUM (COLACE) 100 MG CAPSULE    Take 100 mg by mouth 2 (two) times daily.   GEMFIBROZIL (LOPID) 600 MG TABLET    Take 600 mg by mouth 2 (two) times daily before a meal.   LIDOCAINE (XYLOCAINE) 5 % OINTMENT    as directed.    LISINOPRIL (PRINIVIL,ZESTRIL) 10 MG TABLET    Take 10 mg by mouth daily.   METOPROLOL TARTRATE (LOPRESSOR) 25 MG TABLET    Take 25 mg by mouth daily.   TAMSULOSIN (FLOMAX) 0.4 MG CAPS CAPSULE    Take 0.4 mg by mouth daily.   Modified Medications   Modified Medication Previous Medication   FENTANYL (DURAGESIC - DOSED MCG/HR) 100 MCG/HR fentaNYL (DURAGESIC - DOSED MCG/HR) 100 MCG/HR      Place 1 patch (100 mcg total) onto the skin every 3 (three) days.    Place 1 patch (100 mcg total) onto the skin every 3 (three) days.   OXYCODONE-ACETAMINOPHEN (PERCOCET) 7.5-325 MG TABLET oxyCODONE-acetaminophen (PERCOCET) 7.5-325 MG tablet      Take 1 tablet by mouth every 4 (four) hours as needed for severe pain.    Take 1 tablet by mouth every 4 (four) hours as needed for severe pain.  Discontinued Medications   No medications on file   ----------------------------------------------------------------------------------------------------------------------  Follow-up: Return in about 2 months (around 08/12/2017) for evaluation, med refill.    Molli Barrows, MD

## 2017-08-08 ENCOUNTER — Encounter: Payer: Self-pay | Admitting: Anesthesiology

## 2017-08-08 ENCOUNTER — Other Ambulatory Visit: Payer: Self-pay

## 2017-08-08 ENCOUNTER — Ambulatory Visit: Payer: Medicare Other | Attending: Anesthesiology | Admitting: Anesthesiology

## 2017-08-08 VITALS — BP 173/58 | Temp 99.1°F | Resp 18 | Ht 69.0 in | Wt 174.0 lb

## 2017-08-08 DIAGNOSIS — G8929 Other chronic pain: Secondary | ICD-10-CM | POA: Diagnosis not present

## 2017-08-08 DIAGNOSIS — Z7982 Long term (current) use of aspirin: Secondary | ICD-10-CM | POA: Diagnosis not present

## 2017-08-08 DIAGNOSIS — M5136 Other intervertebral disc degeneration, lumbar region: Secondary | ICD-10-CM | POA: Diagnosis not present

## 2017-08-08 DIAGNOSIS — Z79899 Other long term (current) drug therapy: Secondary | ICD-10-CM | POA: Diagnosis not present

## 2017-08-08 DIAGNOSIS — M4716 Other spondylosis with myelopathy, lumbar region: Secondary | ICD-10-CM | POA: Insufficient documentation

## 2017-08-08 DIAGNOSIS — M47816 Spondylosis without myelopathy or radiculopathy, lumbar region: Secondary | ICD-10-CM | POA: Diagnosis not present

## 2017-08-08 DIAGNOSIS — M5441 Lumbago with sciatica, right side: Secondary | ICD-10-CM | POA: Diagnosis not present

## 2017-08-08 DIAGNOSIS — G894 Chronic pain syndrome: Secondary | ICD-10-CM | POA: Diagnosis not present

## 2017-08-08 DIAGNOSIS — M542 Cervicalgia: Secondary | ICD-10-CM | POA: Diagnosis not present

## 2017-08-08 DIAGNOSIS — M25552 Pain in left hip: Secondary | ICD-10-CM | POA: Insufficient documentation

## 2017-08-08 DIAGNOSIS — F1190 Opioid use, unspecified, uncomplicated: Secondary | ICD-10-CM

## 2017-08-08 DIAGNOSIS — Z79891 Long term (current) use of opiate analgesic: Secondary | ICD-10-CM | POA: Diagnosis not present

## 2017-08-08 DIAGNOSIS — I739 Peripheral vascular disease, unspecified: Secondary | ICD-10-CM | POA: Diagnosis not present

## 2017-08-08 DIAGNOSIS — M5106 Intervertebral disc disorders with myelopathy, lumbar region: Secondary | ICD-10-CM | POA: Diagnosis not present

## 2017-08-08 DIAGNOSIS — M545 Low back pain: Secondary | ICD-10-CM | POA: Diagnosis present

## 2017-08-08 MED ORDER — OXYCODONE-ACETAMINOPHEN 7.5-325 MG PO TABS: 1.0000 | ORAL_TABLET | ORAL | 0 refills | Status: DC | PRN

## 2017-08-08 MED ORDER — FENTANYL 100 MCG/HR TD PT72: 100.0000 ug | MEDICATED_PATCH | TRANSDERMAL | 0 refills | Status: DC

## 2017-08-08 NOTE — Progress Notes (Signed)
Nursing Pain Medication Assessment:  Safety precautions to be maintained throughout the outpatient stay will include: orient to surroundings, keep bed in low position, maintain call bell within reach at all times, provide assistance with transfer out of bed and ambulation.  Medication Inspection Compliance: Pill count conducted under aseptic conditions, in front of the patient. Neither the pills nor the bottle was removed from the patient's sight at any time. Once count was completed pills were immediately returned to the patient in their original bottle.  Medication #1: Fentanyl patch Pill/Patch Count: 3 of 10 pills remain Pill/Patch Appearance: Markings consistent with prescribed medication Bottle Appearance: Standard pharmacy container. Clearly labeled. Filled Date: 03 / 24 / 2019 Last Medication intake:  Yesterday  Medication #2: Oxycodone IR Pill/Patch Count: 17 of 40 pills remain Pill/Patch Appearance: Markings consistent with prescribed medication Bottle Appearance: Standard pharmacy container. Clearly labeled. Filled Date: 03 / 21 / 2019 Last Medication intake:  Day before yesterday

## 2017-08-08 NOTE — Progress Notes (Signed)
Subjective:  Patient ID: [name], male    DOB: Apr 22, 1946  Age: 73 y.o. MRN: [id number]  CC: Back Pain (low) and Hip Pain (bilateral)   Procedure: None  HPI [name] presents for reevaluation.  He was last seen approximately 2 months ago and is doing relatively well with his low back pain.  His current regimen seems to be working well for him.  No changes in the quality characteristic or distribution of his low back pain or leg pain are noted at this time.  He is taking his medications as prescribed and based on his narcotic assessment sheet he continues to derive good functional lifestyle improvement with his medications.  Otherwise [name] is in his usual state of health.  He is having some issues regarding his peripheral vascular disease and is scheduled for further evaluation and possible stenting or bypass by his surgeons.  Outpatient Medications Prior to Visit  Medication Sig Dispense Refill  . aspirin 325 MG EC tablet Take 325 mg by mouth daily.    Marland Kitchen atorvastatin (LIPITOR) 10 MG tablet Take 10 mg by mouth daily.     . busPIRone (BUSPAR) 15 MG tablet Take 15 mg by mouth 3 (three) times daily.     . butalbital-aspirin-caffeine (FIORINAL) 50-325-40 MG capsule Take 1 capsule by mouth every 6 (six) hours as needed for headache.     . citalopram (CELEXA) 20 MG tablet Take 20 mg by mouth daily.  0  . diazepam (VALIUM) 5 MG tablet Take 5 mg by mouth every 6 (six) hours as needed.     . docusate sodium (COLACE) 100 MG capsule Take 100 mg by mouth 2 (two) times daily.    Marland Kitchen gemfibrozil (LOPID) 600 MG tablet Take 600 mg by mouth 2 (two) times daily before a meal.    . lidocaine (XYLOCAINE) 5 % ointment as directed.     Marland Kitchen lisinopril (PRINIVIL,ZESTRIL) 10 MG tablet Take 10 mg by mouth daily.    . metoprolol tartrate (LOPRESSOR) 25 MG tablet Take 25 mg by mouth daily.    . tamsulosin (FLOMAX) 0.4 MG CAPS capsule Take 0.4 mg by mouth daily.    . fentaNYL (DURAGESIC - DOSED  MCG/HR) 100 MCG/HR Place 1 patch (100 mcg total) onto the skin every 3 (three) days. 10 patch 0  . oxyCODONE-acetaminophen (PERCOCET) 7.5-325 MG tablet Take 1 tablet by mouth every 4 (four) hours as needed for severe pain. 40 tablet 0   No facility-administered medications prior to visit.     Review of Systems CNS: No confusion or sedation Cardiac: No angina or palpitations GI: No abdominal pain or constipation Constitutional: No nausea vomiting fevers or chills  Objective:  BP (!) 173/58   Temp 99.1 F (37.3 C) (Oral)   Resp 18   Ht 5\' 9"  (1.753 m)   Wt 174 lb (78.9 kg)   SpO2 98%   BMI 25.70 kg/m    BP Readings from Last 3 Encounters:  08/08/17 (!) 173/58  06/14/17 (!) 142/52  04/22/17 (!) 149/59     Wt Readings from Last 3 Encounters:  08/08/17 174 lb (78.9 kg)  06/14/17 177 lb (80.3 kg)  04/22/17 177 lb (80.3 kg)     Physical Exam Pt is alert and oriented PERRL EOMI HEART IS RRR no murmur or rub LCTA no wheezing or rales MUSCULOSKELETAL reveals persistent antalgic gait.  He is ambulating per baseline in his lower extremity muscle tone and bulk appears at baseline.  In  regards to his neck pain he has 2 trigger points in the mid body trapezius muscle bilaterally.  His upper extremity bicep tricep strength is well-preserved at 5/5 as is his hand grasp.  He has good range of motion at the atlantooccipital joint with evidence of crepitus and probable facet etiology.  His muscle tone and bulk is good Labs  No results found for: HGBA1C Lab Results  Component Value Date   CREATININE 1.20 06/10/2016    -------------------------------------------------------------------------------------------------------------------- Lab Results  Component Value Date   CREATININE 1.20 06/10/2016    --------------------------------------------------------------------------------------------------------------------- Ct Chest W Contrast  Result Date: 06/10/2016 CLINICAL DATA:  72  y/o  M; pulmonary nodules. EXAM: CT CHEST WITH CONTRAST TECHNIQUE: Multidetector CT imaging of the chest was performed during intravenous contrast administration. CONTRAST:  100 cc Isovue 370 COMPARISON:  None. FINDINGS: Cardiovascular: Normal heart size. No pericardial effusion. Severe coronary artery calcification. Aortic valve replacement. Aortic atherosclerosis with moderate calcification. Normal caliber thoracic aorta and main pulmonary artery. Mediastinum/Nodes: No enlarged mediastinal, hilar, or axillary lymph nodes. Thyroid gland, trachea, and esophagus demonstrate no significant findings. Lungs/Pleura: 3 mm right lower lobe pulmonary nodule (series 5, image 69). Right upper lobe 5 mm pulmonary nodule (series 5, image 73). Several small nodules are present along the fissures and juxtapleural probably representing intrapulmonary lymph nodes. Tubular opacity in the left lower lobe (series 5, image 120) likely represents mucous plugging. Mild emphysema most pronounced in the right lung apex. Diffuse faint centrilobular ground-glass nodules seen. Upper Abdomen: Please refer to the concurrent CT of the abdomen and pelvis for further characterization. Musculoskeletal: Multiple endplate Schmorl's nodes. Healed median sternotomy. No acute osseous abnormality is evident. IMPRESSION: 1. Centrilobular ground-glass nodules probably represents respiratory bronchiolitis due to smoking. Differential includes infectious bronchiolitis or hypersensitivity pneumonitis. 2. 3-5 mm pulmonary nodules in the right upper lobe. Comparison with outside imaging is recommended to ensure stability. Otherwise follow-up per Fleischner society criteria: No follow-up needed if patient is low-risk (and has no known or suspected primary neoplasm). Non-contrast chest CT can be considered in 12 months if patient is high-risk. This recommendation follows the consensus statement: Guidelines for Management of Incidental Pulmonary Nodules Detected  on CT Images: From the Fleischner Society 2017; Radiology 2017; 284:228-243. 3. Mild emphysema greatest in right lung apex. 4. Severe coronary artery calcification. Electronically Signed   By: Kristine Garbe M.D.   On: 06/10/2016 15:51   Ct Angio Abd/pel W/ And/or W/o  Result Date: 06/10/2016 CLINICAL DATA:  Bilateral lower extremity pain and difficulty with ambulation. EXAM: CTA ABDOMEN AND PELVIS wITHOUT AND WITH CONTRAST TECHNIQUE: Multidetector CT imaging of the abdomen and pelvis was performed using the standard protocol during bolus administration of intravenous contrast. Multiplanar reconstructed images and MIPs were obtained and reviewed to evaluate the vascular anatomy. CONTRAST:  100 cc Isovue 370 COMPARISON:  None. FINDINGS: VASCULAR Aorta: The aorta is ectatic with a maximal AP diameter of 3.2 cm. This occurs below the renal arteries and contains some smooth chronic appearing mural thrombus. There is extensive irregular calcified plaque at the level of the renal arteries. There is also irregular plaque in the distal aorta with at least some degree of luminal narrowing. Celiac: Patent.  Branch vessels patent. SMA: Patent with mild irregular plaque at its origin. Renals: Single left and 2 right renal arteries are patent. IMA: Patent and diminutive. Inflow: There are extensive atherosclerotic calcifications and plaque in the right common iliac artery which occurs in a diffuse fashion. The right external iliac artery  is also diffusely diseased and diminutive. There is no obvious focal narrowing. Similarly, the left common iliac artery is diffusely diminutive with extensive calcified and irregular plaque throughout its course. Left external iliac artery has a similar appearance. No obvious focal narrowing. Internal iliac arteries are moderately disease bilaterally. Right internal iliac artery is ectatic with a maximal diameter of 11 mm. Proximal Outflow: Bilateral superficial femoral arteries  are occluded at their origins. There is moderate irregular and calcified plaque in both common femoral arteries. Veins: Hepatic, portal, splenic, and superior mesenteric veins are patent. Renal veins are patent. retroaortic left renal vein anatomy is noted. Common and external iliac veins are patent. Review of the MIP images confirms the above findings. NON-VASCULAR Lower chest: There is emphysema in the visualized lung bases. Hepatobiliary: Liver is within normal limits. Gallbladder is mildly distended with a maximal diameter of 5.1 cm. There is no wall thickening or gallstones. Pancreas: Unremarkable. Spleen: Unremarkable. Adrenals/Urinary Tract: Kidneys are within normal limits. Adrenal glands are markedly prominent bilaterally in a hyperplasia pattern without focal mass. Stomach/Bowel: Stomach is unremarkable. Normal appendix. There is no evidence of small-bowel obstruction. No obvious focal mass in the colon. Diverticulosis of the sigmoid colon is present. Sigmoid colon is decompressed. This artificially results in wall thickening. Lymphatic: No abnormal retroperitoneal adenopathy. Reproductive: Prostate is unremarkable. Bladder is within normal limits. Other: There is no free fluid. Right inguinal hernia contains adipose tissue in a tiny portion of the bladder. Musculoskeletal: T11 compression deformity involving the superior endplate has a chronic appearance. No vertebral compression deformity in the lumbar spine. Degenerative disc disease at L4-5 and L5-S1 is present. There is no destructive bone lesion. IMPRESSION: VASCULAR Abdominal aorta is ectatic with a maximal diameter of 3.2 cm. Recommend followup by ultrasound in 3 years. This recommendation follows ACR consensus guidelines: White Paper of the ACR Incidental Findings Committee II on Vascular Findings. J Am Coll Radiol 2013; [phone] There are extensive atherosclerotic changes in the aorta and iliac arterial vasculature but no definitive focal  stenosis to result in arterial insufficiency. Bilateral superficial femoral artery occlusion. NON-VASCULAR Mild gallbladder distention of unknown significance. No gallstones or mass is identified. Right inguinal hernia contains adipose tissue an a tiny portion of the bladder. Electronically Signed   By: Marybelle Killings M.D.   On: 06/10/2016 15:52     Assessment & Plan:   [name] was seen today for back pain and hip pain.  Diagnoses and all orders for this visit:  Chronic pain syndrome  Chronic, continuous use of opioids  Facet arthritis of lumbar region  DDD (degenerative disc disease), lumbar  Chronic bilateral low back pain with right-sided sciatica  Chronic left hip pain  Spondylosis, lumbar, with myelopathy  Cervicalgia -     INJECT TRIGGER POINT, 1 OR 2  Other orders -     Discontinue: fentaNYL (DURAGESIC - DOSED MCG/HR) 100 MCG/HR; Place 1 patch (100 mcg total) onto the skin every 3 (three) days. -     fentaNYL (DURAGESIC - DOSED MCG/HR) 100 MCG/HR; Place 1 patch (100 mcg total) onto the skin every 3 (three) days. -     Discontinue: oxyCODONE-acetaminophen (PERCOCET) 7.5-325 MG tablet; Take 1 tablet by mouth every 4 (four) hours as needed for severe pain. -     oxyCODONE-acetaminophen (PERCOCET) 7.5-325 MG tablet; Take 1 tablet by mouth every 4 (four) hours as needed for severe pain.        ----------------------------------------------------------------------------------------------------------------------  Problem List Items Addressed This Visit  Unprioritized   DDD (degenerative disc disease), lumbar   Relevant Medications   fentaNYL (DURAGESIC - DOSED MCG/HR) 100 MCG/HR   oxyCODONE-acetaminophen (PERCOCET) 7.5-325 MG tablet   Facet arthritis of lumbar region   Relevant Medications   fentaNYL (DURAGESIC - DOSED MCG/HR) 100 MCG/HR   oxyCODONE-acetaminophen (PERCOCET) 7.5-325 MG tablet    Other Visit Diagnoses    Chronic pain syndrome    -  Primary    Chronic, continuous use of opioids       Chronic bilateral low back pain with right-sided sciatica       Relevant Medications   fentaNYL (DURAGESIC - DOSED MCG/HR) 100 MCG/HR   oxyCODONE-acetaminophen (PERCOCET) 7.5-325 MG tablet   Chronic left hip pain       Relevant Medications   fentaNYL (DURAGESIC - DOSED MCG/HR) 100 MCG/HR   oxyCODONE-acetaminophen (PERCOCET) 7.5-325 MG tablet   Spondylosis, lumbar, with myelopathy       Cervicalgia       Relevant Orders   INJECT TRIGGER POINT, 1 OR 2        ----------------------------------------------------------------------------------------------------------------------  1. Chronic pain syndrome We will have him continue his current regimen.  Refills will be given for the next 2 months.  These will be dated for May 23 and June 22 for his Duragesic furthermore we have reviewed the [facility] practitioner database information and it is appropriate.  We are schedule him for return to clinic in approximately 2 months.  2. Chronic, continuous use of opioids As above  3. Facet arthritis of lumbar region As above and continue with physical therapy exercises.  4. DDD (degenerative disc disease), lumbar   5. Chronic bilateral low back pain with right-sided sciatica   6. Chronic left hip pain   7. Spondylosis, lumbar, with myelopathy   8. Cervicalgia He is complaining of some neck pain and tenderness and I have given him some exercises to help with the cervicalgia.  We will schedule him for trigger point injections at his next visit if he fails to gain relief with conservative measures  - INJECT TRIGGER POINT, 1 OR 2    ----------------------------------------------------------------------------------------------------------------------  I am having [name] maintain his docusate sodium, tamsulosin, gemfibrozil, lisinopril, metoprolol tartrate, butalbital-aspirin-caffeine, atorvastatin, busPIRone, citalopram, diazepam,  aspirin, lidocaine, fentaNYL, and oxyCODONE-acetaminophen.   Meds ordered this encounter  Medications  . DISCONTD: fentaNYL (DURAGESIC - DOSED MCG/HR) 100 MCG/HR    Sig: Place 1 patch (100 mcg total) onto the skin every 3 (three) days.    Dispense:  10 patch    Refill:  0    Do not fill until [id number]  . fentaNYL (DURAGESIC - DOSED MCG/HR) 100 MCG/HR    Sig: Place 1 patch (100 mcg total) onto the skin every 3 (three) days.    Dispense:  10 patch    Refill:  0    Do not fill until [id number]  . DISCONTD: oxyCODONE-acetaminophen (PERCOCET) 7.5-325 MG tablet    Sig: Take 1 tablet by mouth every 4 (four) hours as needed for severe pain.    Dispense:  40 tablet    Refill:  0    Do not fill until [id number]  . oxyCODONE-acetaminophen (PERCOCET) 7.5-325 MG tablet    Sig: Take 1 tablet by mouth every 4 (four) hours as needed for severe pain.    Dispense:  40 tablet    Refill:  0    Do not fill until [id number]   Patient's Medications  New Prescriptions   No medications on  file  Previous Medications   ASPIRIN 325 MG EC TABLET    Take 325 mg by mouth daily.   ATORVASTATIN (LIPITOR) 10 MG TABLET    Take 10 mg by mouth daily.    BUSPIRONE (BUSPAR) 15 MG TABLET    Take 15 mg by mouth 3 (three) times daily.    BUTALBITAL-ASPIRIN-CAFFEINE (FIORINAL) 50-325-40 MG CAPSULE    Take 1 capsule by mouth every 6 (six) hours as needed for headache.    CITALOPRAM (CELEXA) 20 MG TABLET    Take 20 mg by mouth daily.   DIAZEPAM (VALIUM) 5 MG TABLET    Take 5 mg by mouth every 6 (six) hours as needed.    DOCUSATE SODIUM (COLACE) 100 MG CAPSULE    Take 100 mg by mouth 2 (two) times daily.   GEMFIBROZIL (LOPID) 600 MG TABLET    Take 600 mg by mouth 2 (two) times daily before a meal.   LIDOCAINE (XYLOCAINE) 5 % OINTMENT    as directed.    LISINOPRIL (PRINIVIL,ZESTRIL) 10 MG TABLET    Take 10 mg by mouth daily.   METOPROLOL TARTRATE (LOPRESSOR) 25 MG TABLET    Take 25 mg by mouth daily.   TAMSULOSIN (FLOMAX) 0.4  MG CAPS CAPSULE    Take 0.4 mg by mouth daily.  Modified Medications   Modified Medication Previous Medication   FENTANYL (DURAGESIC - DOSED MCG/HR) 100 MCG/HR fentaNYL (DURAGESIC - DOSED MCG/HR) 100 MCG/HR      Place 1 patch (100 mcg total) onto the skin every 3 (three) days.    Place 1 patch (100 mcg total) onto the skin every 3 (three) days.   OXYCODONE-ACETAMINOPHEN (PERCOCET) 7.5-325 MG TABLET oxyCODONE-acetaminophen (PERCOCET) 7.5-325 MG tablet      Take 1 tablet by mouth every 4 (four) hours as needed for severe pain.    Take 1 tablet by mouth every 4 (four) hours as needed for severe pain.  Discontinued Medications   No medications on file   ----------------------------------------------------------------------------------------------------------------------  Follow-up: Return in about 2 months (around 10/08/2017) for evaluation, med refill, procedure.    Molli Barrows, MD

## 2017-09-26 ENCOUNTER — Encounter: Payer: Self-pay | Admitting: Anesthesiology

## 2017-09-26 ENCOUNTER — Telehealth: Payer: Self-pay | Admitting: Anesthesiology

## 2017-09-26 ENCOUNTER — Ambulatory Visit: Payer: Medicare Other | Attending: Anesthesiology | Admitting: Anesthesiology

## 2017-09-26 ENCOUNTER — Other Ambulatory Visit: Payer: Self-pay

## 2017-09-26 VITALS — BP 125/68 | HR 69 | Temp 98.7°F | Resp 18 | Ht 68.0 in | Wt 175.0 lb

## 2017-09-26 DIAGNOSIS — M5106 Intervertebral disc disorders with myelopathy, lumbar region: Secondary | ICD-10-CM | POA: Insufficient documentation

## 2017-09-26 DIAGNOSIS — J439 Emphysema, unspecified: Secondary | ICD-10-CM | POA: Insufficient documentation

## 2017-09-26 DIAGNOSIS — M47816 Spondylosis without myelopathy or radiculopathy, lumbar region: Secondary | ICD-10-CM

## 2017-09-26 DIAGNOSIS — G894 Chronic pain syndrome: Secondary | ICD-10-CM

## 2017-09-26 DIAGNOSIS — Z79899 Other long term (current) drug therapy: Secondary | ICD-10-CM | POA: Diagnosis not present

## 2017-09-26 DIAGNOSIS — Z7952 Long term (current) use of systemic steroids: Secondary | ICD-10-CM | POA: Insufficient documentation

## 2017-09-26 DIAGNOSIS — M25552 Pain in left hip: Secondary | ICD-10-CM

## 2017-09-26 DIAGNOSIS — Z79891 Long term (current) use of opiate analgesic: Secondary | ICD-10-CM | POA: Diagnosis not present

## 2017-09-26 DIAGNOSIS — K5900 Constipation, unspecified: Secondary | ICD-10-CM | POA: Diagnosis not present

## 2017-09-26 DIAGNOSIS — M5441 Lumbago with sciatica, right side: Secondary | ICD-10-CM | POA: Diagnosis not present

## 2017-09-26 DIAGNOSIS — R531 Weakness: Secondary | ICD-10-CM | POA: Insufficient documentation

## 2017-09-26 DIAGNOSIS — G8929 Other chronic pain: Secondary | ICD-10-CM

## 2017-09-26 DIAGNOSIS — F1190 Opioid use, unspecified, uncomplicated: Secondary | ICD-10-CM | POA: Diagnosis not present

## 2017-09-26 DIAGNOSIS — Z7982 Long term (current) use of aspirin: Secondary | ICD-10-CM | POA: Diagnosis not present

## 2017-09-26 DIAGNOSIS — M4716 Other spondylosis with myelopathy, lumbar region: Secondary | ICD-10-CM | POA: Diagnosis not present

## 2017-09-26 DIAGNOSIS — M5136 Other intervertebral disc degeneration, lumbar region: Secondary | ICD-10-CM | POA: Diagnosis not present

## 2017-09-26 MED ORDER — PREDNISONE 10 MG PO TABS: 10.0000 mg | ORAL_TABLET | Freq: Every day | ORAL | 0 refills | Status: DC

## 2017-09-26 NOTE — Progress Notes (Signed)
Safety precautions to be maintained throughout the outpatient stay will include: orient to surroundings, keep bed in low position, maintain call bell within reach at all times, provide assistance with transfer out of bed and ambulation.  

## 2017-09-26 NOTE — Telephone Encounter (Signed)
Patient having extreme pain, bent over and something cracked in his back. Having trouble going to bathroom.  Please call ASAP

## 2017-09-26 NOTE — Telephone Encounter (Signed)
Picked up a heavy object 5 days ago, felt a "pop", and had sudden onset of pain. Having difficulty moving and having BM.Dr. Andree Elk notified, can have him come at 2:00 today. Patient notified.

## 2017-09-27 ENCOUNTER — Encounter: Payer: Self-pay | Admitting: Anesthesiology

## 2017-09-27 NOTE — Progress Notes (Signed)
Subjective:  Patient ID: [name], male    DOB: 01-06-1946  Age: 72 y.o. MRN: [id number]  CC: Back Pain (lower)   Procedure: None  HPI [name] presents for reevaluation.  Unfortunately he recently bent over to pick up a heavy object and felt a pop in his low back.  Since that time he has had excruciating low back pain primarily centralized worse with standing and forward leaning.  He gets pain radiating down into the right leg and right foot.  The pain has remained quite recalcitrant despite his standard baseline opioid management.  He has had some problems with constipation but bowel bladder function have been stable otherwise and his baseline weakness on the right leg persists.  No other changes are mentioned at this time.  The pain in the back is been at a VAS of 10.  At this point he has difficulty finding a position that is comfortable and he has had problems with his sleep as well.  Outpatient Medications Prior to Visit  Medication Sig Dispense Refill  . aspirin 325 MG EC tablet Take 325 mg by mouth daily.    Marland Kitchen atorvastatin (LIPITOR) 10 MG tablet Take 20 mg by mouth daily.     . busPIRone (BUSPAR) 15 MG tablet Take 15 mg by mouth 3 (three) times daily.     . butalbital-aspirin-caffeine (FIORINAL) 50-325-40 MG capsule Take 1 capsule by mouth every 6 (six) hours as needed for headache.     . citalopram (CELEXA) 20 MG tablet Take 20 mg by mouth daily.  0  . diazepam (VALIUM) 5 MG tablet Take 5 mg by mouth every 6 (six) hours as needed.     . docusate sodium (COLACE) 100 MG capsule Take 100 mg by mouth 2 (two) times daily.    . fentaNYL (DURAGESIC - DOSED MCG/HR) 100 MCG/HR Place 1 patch (100 mcg total) onto the skin every 3 (three) days. 10 patch 0  . gemfibrozil (LOPID) 600 MG tablet Take 600 mg by mouth 2 (two) times daily before a meal.    . lidocaine (XYLOCAINE) 5 % ointment as directed.     Marland Kitchen lisinopril (PRINIVIL,ZESTRIL) 10 MG tablet Take 20 mg by mouth daily.      . metoprolol tartrate (LOPRESSOR) 25 MG tablet Take 25 mg by mouth daily.    Marland Kitchen oxyCODONE-acetaminophen (PERCOCET) 7.5-325 MG tablet Take 1 tablet by mouth every 4 (four) hours as needed for severe pain. 40 tablet 0  . tamsulosin (FLOMAX) 0.4 MG CAPS capsule Take 0.4 mg by mouth daily.     No facility-administered medications prior to visit.     Review of Systems CNS: No confusion or sedation Cardiac: No angina or palpitations GI: No abdominal pain or constipation Constitutional: No nausea vomiting fevers or chills  Objective:  BP 125/68   Pulse 69   Temp 98.7 F (37.1 C) (Oral)   Resp 18   Ht 5\' 8"  (1.727 m)   Wt 175 lb (79.4 kg)   SpO2 99%   BMI 26.61 kg/m    BP Readings from Last 3 Encounters:  09/26/17 125/68  08/08/17 (!) 173/58  06/14/17 (!) 142/52     Wt Readings from Last 3 Encounters:  09/26/17 175 lb (79.4 kg)  08/08/17 174 lb (78.9 kg)  06/14/17 177 lb (80.3 kg)     Physical Exam Pt is alert and oriented PERRL EOMI HEART IS RRR no murmur or rub LCTA no wheezing or rales MUSCULOSKELETAL reveals some  diffuse paraspinous muscle tenderness from T10-L4.  Certain positions can exacerbate a severe midline L3 deep pain but this is not reproducible with palpation.  His strength appears to be at baseline as is his muscle tone and bulk to the lower extremities.  Sensation is also preserved to the lower extremities.  Labs  No results found for: HGBA1C Lab Results  Component Value Date   CREATININE 1.20 06/10/2016    -------------------------------------------------------------------------------------------------------------------- Lab Results  Component Value Date   CREATININE 1.20 06/10/2016    --------------------------------------------------------------------------------------------------------------------- Ct Chest W Contrast  Result Date: 06/10/2016 CLINICAL DATA:  72 y/o  M; pulmonary nodules. EXAM: CT CHEST WITH CONTRAST TECHNIQUE: Multidetector  CT imaging of the chest was performed during intravenous contrast administration. CONTRAST:  100 cc Isovue 370 COMPARISON:  None. FINDINGS: Cardiovascular: Normal heart size. No pericardial effusion. Severe coronary artery calcification. Aortic valve replacement. Aortic atherosclerosis with moderate calcification. Normal caliber thoracic aorta and main pulmonary artery. Mediastinum/Nodes: No enlarged mediastinal, hilar, or axillary lymph nodes. Thyroid gland, trachea, and esophagus demonstrate no significant findings. Lungs/Pleura: 3 mm right lower lobe pulmonary nodule (series 5, image 69). Right upper lobe 5 mm pulmonary nodule (series 5, image 73). Several small nodules are present along the fissures and juxtapleural probably representing intrapulmonary lymph nodes. Tubular opacity in the left lower lobe (series 5, image 120) likely represents mucous plugging. Mild emphysema most pronounced in the right lung apex. Diffuse faint centrilobular ground-glass nodules seen. Upper Abdomen: Please refer to the concurrent CT of the abdomen and pelvis for further characterization. Musculoskeletal: Multiple endplate Schmorl's nodes. Healed median sternotomy. No acute osseous abnormality is evident. IMPRESSION: 1. Centrilobular ground-glass nodules probably represents respiratory bronchiolitis due to smoking. Differential includes infectious bronchiolitis or hypersensitivity pneumonitis. 2. 3-5 mm pulmonary nodules in the right upper lobe. Comparison with outside imaging is recommended to ensure stability. Otherwise follow-up per Fleischner society criteria: No follow-up needed if patient is low-risk (and has no known or suspected primary neoplasm). Non-contrast chest CT can be considered in 12 months if patient is high-risk. This recommendation follows the consensus statement: Guidelines for Management of Incidental Pulmonary Nodules Detected on CT Images: From the Fleischner Society 2017; Radiology 2017; 284:228-243. 3.  Mild emphysema greatest in right lung apex. 4. Severe coronary artery calcification. Electronically Signed   By: Kristine Garbe M.D.   On: 06/10/2016 15:51   Ct Angio Abd/pel W/ And/or W/o  Result Date: 06/10/2016 CLINICAL DATA:  Bilateral lower extremity pain and difficulty with ambulation. EXAM: CTA ABDOMEN AND PELVIS wITHOUT AND WITH CONTRAST TECHNIQUE: Multidetector CT imaging of the abdomen and pelvis was performed using the standard protocol during bolus administration of intravenous contrast. Multiplanar reconstructed images and MIPs were obtained and reviewed to evaluate the vascular anatomy. CONTRAST:  100 cc Isovue 370 COMPARISON:  None. FINDINGS: VASCULAR Aorta: The aorta is ectatic with a maximal AP diameter of 3.2 cm. This occurs below the renal arteries and contains some smooth chronic appearing mural thrombus. There is extensive irregular calcified plaque at the level of the renal arteries. There is also irregular plaque in the distal aorta with at least some degree of luminal narrowing. Celiac: Patent.  Branch vessels patent. SMA: Patent with mild irregular plaque at its origin. Renals: Single left and 2 right renal arteries are patent. IMA: Patent and diminutive. Inflow: There are extensive atherosclerotic calcifications and plaque in the right common iliac artery which occurs in a diffuse fashion. The right external iliac artery is also diffusely diseased and diminutive. There  is no obvious focal narrowing. Similarly, the left common iliac artery is diffusely diminutive with extensive calcified and irregular plaque throughout its course. Left external iliac artery has a similar appearance. No obvious focal narrowing. Internal iliac arteries are moderately disease bilaterally. Right internal iliac artery is ectatic with a maximal diameter of 11 mm. Proximal Outflow: Bilateral superficial femoral arteries are occluded at their origins. There is moderate irregular and calcified plaque in  [name] common femoral arteries. Veins: Hepatic, portal, splenic, and superior mesenteric veins are patent. Renal veins are patent. retroaortic left renal vein anatomy is noted. Common and external iliac veins are patent. Review of the MIP images confirms the above findings. NON-VASCULAR Lower chest: There is emphysema in the visualized lung bases. Hepatobiliary: Liver is within normal limits. Gallbladder is mildly distended with a maximal diameter of 5.1 cm. There is no wall thickening or gallstones. Pancreas: Unremarkable. Spleen: Unremarkable. Adrenals/Urinary Tract: Kidneys are within normal limits. Adrenal glands are markedly prominent bilaterally in a hyperplasia pattern without focal mass. Stomach/Bowel: Stomach is unremarkable. Normal appendix. There is no evidence of small-bowel obstruction. No obvious focal mass in the colon. Diverticulosis of the sigmoid colon is present. Sigmoid colon is decompressed. This artificially results in wall thickening. Lymphatic: No abnormal retroperitoneal adenopathy. Reproductive: Prostate is unremarkable. Bladder is within normal limits. Other: There is no free fluid. Right inguinal hernia contains adipose tissue in a tiny portion of the bladder. Musculoskeletal: T11 compression deformity involving the superior endplate has a chronic appearance. No vertebral compression deformity in the lumbar spine. Degenerative disc disease at L4-5 and L5-S1 is present. There is no destructive bone lesion. IMPRESSION: VASCULAR Abdominal aorta is ectatic with a maximal diameter of 3.2 cm. Recommend followup by ultrasound in 3 years. This recommendation follows ACR consensus guidelines: White Paper of the ACR Incidental Findings Committee II on Vascular Findings. J Am Coll Radiol 2013; [phone] There are extensive atherosclerotic changes in the aorta and iliac arterial vasculature but no definitive focal stenosis to result in arterial insufficiency. Bilateral superficial femoral artery  occlusion. NON-VASCULAR Mild gallbladder distention of unknown significance. No gallstones or mass is identified. Right inguinal hernia contains adipose tissue an a tiny portion of the bladder. Electronically Signed   By: Marybelle Killings M.D.   On: 06/10/2016 15:52     Assessment & Plan:   [name] was seen today for back pain.  Diagnoses and all orders for this visit:  Chronic pain syndrome  Chronic, continuous use of opioids  Facet arthritis of lumbar region  DDD (degenerative disc disease), lumbar  Chronic bilateral low back pain with right-sided sciatica  Chronic left hip pain  Spondylosis, lumbar, with myelopathy  Acute right-sided low back pain with right-sided sciatica  Other orders -     predniSONE (DELTASONE) 10 MG tablet; Take 1 tablet (10 mg total) by mouth daily.        ----------------------------------------------------------------------------------------------------------------------  Problem List Items Addressed This Visit      Unprioritized   DDD (degenerative disc disease), lumbar   Relevant Medications   predniSONE (DELTASONE) 10 MG tablet   Facet arthritis of lumbar region   Relevant Medications   predniSONE (DELTASONE) 10 MG tablet    Other Visit Diagnoses    Chronic pain syndrome    -  Primary   Chronic, continuous use of opioids       Chronic bilateral low back pain with right-sided sciatica       Relevant Medications   predniSONE (DELTASONE) 10 MG tablet  Chronic left hip pain       Relevant Medications   predniSONE (DELTASONE) 10 MG tablet   Spondylosis, lumbar, with myelopathy       Acute right-sided low back pain with right-sided sciatica       Relevant Medications   predniSONE (DELTASONE) 10 MG tablet        ----------------------------------------------------------------------------------------------------------------------  1. Chronic pain syndrome Lumbar continue his baseline opioid medication management.  He is due for  return evaluation and refill in a month.  2. Chronic, continuous use of opioids As above we have reviewed the [facility] practitioner database information and it remains appropriate  3. Facet arthritis of lumbar region Continue with stretching as reviewed today  4. DDD (degenerative disc disease), lumbar As above  5. Chronic bilateral low back pain with right-sided sciatica As above  6. Chronic left hip pain   7. Spondylosis, lumbar, with myelopathy   8. Acute right-sided low back pain with right-sided sciatica I am going to start him on a steroid Dosepak at 60 mg day 1.60 mg a day 2, 50 mg day 3 and subsequent taper to see if this can help with his radiculitis L4.    ----------------------------------------------------------------------------------------------------------------------  I am having [name] start on predniSONE. I am also having him maintain his docusate sodium, tamsulosin, gemfibrozil, lisinopril, metoprolol tartrate, butalbital-aspirin-caffeine, atorvastatin, busPIRone, citalopram, diazepam, aspirin, lidocaine, fentaNYL, and oxyCODONE-acetaminophen.   Meds ordered this encounter  Medications  . predniSONE (DELTASONE) 10 MG tablet    Sig: Take 1 tablet (10 mg total) by mouth daily.    Dispense:  42 tablet    Refill:  0    60 mg day one, 60 mg day 2, 50 mg day 3, 50 mg day 4 with sequential reduction with stop at day 12   Patient's Medications  New Prescriptions   PREDNISONE (DELTASONE) 10 MG TABLET    Take 1 tablet (10 mg total) by mouth daily.  Previous Medications   ASPIRIN 325 MG EC TABLET    Take 325 mg by mouth daily.   ATORVASTATIN (LIPITOR) 10 MG TABLET    Take 20 mg by mouth daily.    BUSPIRONE (BUSPAR) 15 MG TABLET    Take 15 mg by mouth 3 (three) times daily.    BUTALBITAL-ASPIRIN-CAFFEINE (FIORINAL) 50-325-40 MG CAPSULE    Take 1 capsule by mouth every 6 (six) hours as needed for headache.    CITALOPRAM (CELEXA) 20 MG TABLET     Take 20 mg by mouth daily.   DIAZEPAM (VALIUM) 5 MG TABLET    Take 5 mg by mouth every 6 (six) hours as needed.    DOCUSATE SODIUM (COLACE) 100 MG CAPSULE    Take 100 mg by mouth 2 (two) times daily.   FENTANYL (DURAGESIC - DOSED MCG/HR) 100 MCG/HR    Place 1 patch (100 mcg total) onto the skin every 3 (three) days.   GEMFIBROZIL (LOPID) 600 MG TABLET    Take 600 mg by mouth 2 (two) times daily before a meal.   LIDOCAINE (XYLOCAINE) 5 % OINTMENT    as directed.    LISINOPRIL (PRINIVIL,ZESTRIL) 10 MG TABLET    Take 20 mg by mouth daily.    METOPROLOL TARTRATE (LOPRESSOR) 25 MG TABLET    Take 25 mg by mouth daily.   OXYCODONE-ACETAMINOPHEN (PERCOCET) 7.5-325 MG TABLET    Take 1 tablet by mouth every 4 (four) hours as needed for severe pain.   TAMSULOSIN (FLOMAX) 0.4 MG CAPS CAPSULE  Take 0.4 mg by mouth daily.  Modified Medications   No medications on file  Discontinued Medications   No medications on file   ----------------------------------------------------------------------------------------------------------------------  Follow-up: Return in about 1 month (around 10/26/2017) for evaluation, med refill.    Molli Barrows, MD

## 2017-10-12 ENCOUNTER — Ambulatory Visit: Payer: Medicare Other | Attending: Anesthesiology | Admitting: Anesthesiology

## 2017-10-12 ENCOUNTER — Encounter: Payer: Self-pay | Admitting: Anesthesiology

## 2017-10-12 VITALS — BP 153/65 | HR 74 | Temp 98.3°F | Resp 16 | Ht 69.0 in | Wt 175.0 lb

## 2017-10-12 DIAGNOSIS — M5136 Other intervertebral disc degeneration, lumbar region: Secondary | ICD-10-CM

## 2017-10-12 DIAGNOSIS — G894 Chronic pain syndrome: Secondary | ICD-10-CM

## 2017-10-12 DIAGNOSIS — M5441 Lumbago with sciatica, right side: Secondary | ICD-10-CM | POA: Insufficient documentation

## 2017-10-12 DIAGNOSIS — F1190 Opioid use, unspecified, uncomplicated: Secondary | ICD-10-CM

## 2017-10-12 DIAGNOSIS — M5106 Intervertebral disc disorders with myelopathy, lumbar region: Secondary | ICD-10-CM | POA: Diagnosis not present

## 2017-10-12 DIAGNOSIS — Z79891 Long term (current) use of opiate analgesic: Secondary | ICD-10-CM | POA: Insufficient documentation

## 2017-10-12 DIAGNOSIS — Z79899 Other long term (current) drug therapy: Secondary | ICD-10-CM | POA: Diagnosis not present

## 2017-10-12 DIAGNOSIS — M542 Cervicalgia: Secondary | ICD-10-CM

## 2017-10-12 DIAGNOSIS — M545 Low back pain: Secondary | ICD-10-CM | POA: Diagnosis present

## 2017-10-12 DIAGNOSIS — M4716 Other spondylosis with myelopathy, lumbar region: Secondary | ICD-10-CM | POA: Insufficient documentation

## 2017-10-12 DIAGNOSIS — G8929 Other chronic pain: Secondary | ICD-10-CM

## 2017-10-12 DIAGNOSIS — M25552 Pain in left hip: Secondary | ICD-10-CM | POA: Insufficient documentation

## 2017-10-12 DIAGNOSIS — M47816 Spondylosis without myelopathy or radiculopathy, lumbar region: Secondary | ICD-10-CM | POA: Diagnosis not present

## 2017-10-12 MED ORDER — OXYCODONE-ACETAMINOPHEN 7.5-325 MG PO TABS: 1.0000 | ORAL_TABLET | ORAL | 0 refills | Status: DC | PRN

## 2017-10-12 MED ORDER — OXYCODONE-ACETAMINOPHEN 7.5-325 MG PO TABS: 1.0000 | ORAL_TABLET | ORAL | 0 refills | Status: AC | PRN

## 2017-10-12 MED ORDER — FENTANYL 100 MCG/HR TD PT72: 100.0000 ug | MEDICATED_PATCH | TRANSDERMAL | 0 refills | Status: DC

## 2017-10-12 NOTE — Progress Notes (Signed)
Nursing Pain Medication Assessment:  Safety precautions to be maintained throughout the outpatient stay will include: orient to surroundings, keep bed in low position, maintain call bell within reach at all times, provide assistance with transfer out of bed and ambulation.  Medication Inspection Compliance: Pill count conducted under aseptic conditions, in front of the patient. Neither the pills nor the bottle was removed from the patient's sight at any time. Once count was completed pills were immediately returned to the patient in their original bottle.  Medication #1: Fentanyl patch Pill/Patch Count: 1 of 10 pills remain Pill/Patch Appearance: Markings consistent with prescribed medication Bottle Appearance: Standard pharmacy container. Clearly labeled. Filled Date: 05 / 23 / 2019 Last Medication intake:  Today  Medication #2: Oxycodone/APAP Pill/Patch Count: 0 of 40 pills remain Pill/Patch Appearance: Markings consistent with prescribed medication Bottle Appearance: Standard pharmacy container. Clearly labeled. Filled Date: 05 / 20 / 2019 Last Medication intake:  Ran out of medicine more than 48 hours ago

## 2017-10-14 NOTE — Progress Notes (Signed)
Subjective:  Patient ID: [name], male    DOB: Jul 10, 1945  Age: 72 y.o. MRN: [id number]  CC: Back Pain (lower right is worse and radiates over to the left )   Procedure: None  HPI [name] presents for reevaluation.  He is recently finished up his course of oral prednisone for his recent exacerbation and acute worsening of his right lower back pain.  He still getting some pain radiating into the right calf and right anterior thigh.  This is of the same quality characteristic is previously documented.  The oral steroids did cause some reduction and severe pain however this has recurred.  He also feels that he has had some weakness beyond baseline in the right anterior thigh.  He is here today using a walker.  No fevers chills nausea or vomiting or mention today.  Is taking his medications as prescribed as well with no evidence of diverting or illicit use.  Based on his narcotic assessment sheet he is also derive good functional lifestyle improvement with these medications.  He has applied heat and ice and maintains that nothing really is giving him much relief.  He has been doing some stretching exercises without relief either.  Outpatient Medications Prior to Visit  Medication Sig Dispense Refill  . aspirin 325 MG EC tablet Take 325 mg by mouth daily.    Marland Kitchen atorvastatin (LIPITOR) 10 MG tablet Take 20 mg by mouth daily.     . busPIRone (BUSPAR) 15 MG tablet Take 15 mg by mouth 3 (three) times daily.     . butalbital-aspirin-caffeine (FIORINAL) 50-325-40 MG capsule Take 1 capsule by mouth every 6 (six) hours as needed for headache.     . citalopram (CELEXA) 20 MG tablet Take 20 mg by mouth daily.  0  . diazepam (VALIUM) 5 MG tablet Take 5 mg by mouth every 6 (six) hours as needed.     . docusate sodium (COLACE) 100 MG capsule Take 100 mg by mouth 2 (two) times daily.    Marland Kitchen gemfibrozil (LOPID) 600 MG tablet Take 600 mg by mouth 2 (two) times daily before a meal.    . lidocaine  (XYLOCAINE) 5 % ointment as directed.     Marland Kitchen lisinopril (PRINIVIL,ZESTRIL) 10 MG tablet Take 20 mg by mouth daily.     . metoprolol tartrate (LOPRESSOR) 25 MG tablet Take 25 mg by mouth daily.    . tamsulosin (FLOMAX) 0.4 MG CAPS capsule Take 0.4 mg by mouth daily.    . fentaNYL (DURAGESIC - DOSED MCG/HR) 100 MCG/HR Place 1 patch (100 mcg total) onto the skin every 3 (three) days. 10 patch 0  . oxyCODONE-acetaminophen (PERCOCET) 7.5-325 MG tablet Take 1 tablet by mouth every 4 (four) hours as needed for severe pain. 40 tablet 0  . predniSONE (DELTASONE) 10 MG tablet Take 1 tablet (10 mg total) by mouth daily. (Patient not taking: Reported on 10/12/2017) 42 tablet 0   No facility-administered medications prior to visit.     Review of Systems CNS: No confusion or sedation Cardiac: No angina or palpitations GI: No abdominal pain or constipation Constitutional: No nausea vomiting fevers or chills  Objective:  BP (!) 153/65 (BP Location: Right Arm, Patient Position: Sitting, Cuff Size: Normal)   Pulse 74   Temp 98.3 F (36.8 C) (Oral)   Resp 16   Ht 5\' 9"  (1.753 m)   Wt 175 lb (79.4 kg)   SpO2 100%   BMI 25.84 kg/m  BP Readings from Last 3 Encounters:  10/12/17 (!) 153/65  09/26/17 125/68  08/08/17 (!) 173/58     Wt Readings from Last 3 Encounters:  10/12/17 175 lb (79.4 kg)  09/26/17 175 lb (79.4 kg)  08/08/17 174 lb (78.9 kg)     Physical Exam Pt is alert and oriented PERRL EOMI HEART IS RRR no murmur or rub LCTA no wheezing or rales MUSCULOSKELETAL reveals some bilateral paraspinous muscle tenderness but no overt trigger points.  He is slightly weaker with extension of the right leg than his baseline.  He ambulates with an antalgic gait and is using a walker for assistance.  Labs  No results found for: HGBA1C Lab Results  Component Value Date   CREATININE 1.20 06/10/2016     -------------------------------------------------------------------------------------------------------------------- Lab Results  Component Value Date   CREATININE 1.20 06/10/2016    --------------------------------------------------------------------------------------------------------------------- Ct Chest W Contrast  Result Date: 06/10/2016 CLINICAL DATA:  72 y/o  M; pulmonary nodules. EXAM: CT CHEST WITH CONTRAST TECHNIQUE: Multidetector CT imaging of the chest was performed during intravenous contrast administration. CONTRAST:  100 cc Isovue 370 COMPARISON:  None. FINDINGS: Cardiovascular: Normal heart size. No pericardial effusion. Severe coronary artery calcification. Aortic valve replacement. Aortic atherosclerosis with moderate calcification. Normal caliber thoracic aorta and main pulmonary artery. Mediastinum/Nodes: No enlarged mediastinal, hilar, or axillary lymph nodes. Thyroid gland, trachea, and esophagus demonstrate no significant findings. Lungs/Pleura: 3 mm right lower lobe pulmonary nodule (series 5, image 69). Right upper lobe 5 mm pulmonary nodule (series 5, image 73). Several small nodules are present along the fissures and juxtapleural probably representing intrapulmonary lymph nodes. Tubular opacity in the left lower lobe (series 5, image 120) likely represents mucous plugging. Mild emphysema most pronounced in the right lung apex. Diffuse faint centrilobular ground-glass nodules seen. Upper Abdomen: Please refer to the concurrent CT of the abdomen and pelvis for further characterization. Musculoskeletal: Multiple endplate Schmorl's nodes. Healed median sternotomy. No acute osseous abnormality is evident. IMPRESSION: 1. Centrilobular ground-glass nodules probably represents respiratory bronchiolitis due to smoking. Differential includes infectious bronchiolitis or hypersensitivity pneumonitis. 2. 3-5 mm pulmonary nodules in the right upper lobe. Comparison with outside imaging  is recommended to ensure stability. Otherwise follow-up per Fleischner society criteria: No follow-up needed if patient is low-risk (and has no known or suspected primary neoplasm). Non-contrast chest CT can be considered in 12 months if patient is high-risk. This recommendation follows the consensus statement: Guidelines for Management of Incidental Pulmonary Nodules Detected on CT Images: From the Fleischner Society 2017; Radiology 2017; 284:228-243. 3. Mild emphysema greatest in right lung apex. 4. Severe coronary artery calcification. Electronically Signed   By: Kristine Garbe M.D.   On: 06/10/2016 15:51   Ct Angio Abd/pel W/ And/or W/o  Result Date: 06/10/2016 CLINICAL DATA:  Bilateral lower extremity pain and difficulty with ambulation. EXAM: CTA ABDOMEN AND PELVIS wITHOUT AND WITH CONTRAST TECHNIQUE: Multidetector CT imaging of the abdomen and pelvis was performed using the standard protocol during bolus administration of intravenous contrast. Multiplanar reconstructed images and MIPs were obtained and reviewed to evaluate the vascular anatomy. CONTRAST:  100 cc Isovue 370 COMPARISON:  None. FINDINGS: VASCULAR Aorta: The aorta is ectatic with a maximal AP diameter of 3.2 cm. This occurs below the renal arteries and contains some smooth chronic appearing mural thrombus. There is extensive irregular calcified plaque at the level of the renal arteries. There is also irregular plaque in the distal aorta with at least some degree of luminal narrowing. Celiac: Patent.  Branch vessels patent. SMA: Patent with mild irregular plaque at its origin. Renals: Single left and 2 right renal arteries are patent. IMA: Patent and diminutive. Inflow: There are extensive atherosclerotic calcifications and plaque in the right common iliac artery which occurs in a diffuse fashion. The right external iliac artery is also diffusely diseased and diminutive. There is no obvious focal narrowing. Similarly, the left common  iliac artery is diffusely diminutive with extensive calcified and irregular plaque throughout its course. Left external iliac artery has a similar appearance. No obvious focal narrowing. Internal iliac arteries are moderately disease bilaterally. Right internal iliac artery is ectatic with a maximal diameter of 11 mm. Proximal Outflow: Bilateral superficial femoral arteries are occluded at their origins. There is moderate irregular and calcified plaque in both common femoral arteries. Veins: Hepatic, portal, splenic, and superior mesenteric veins are patent. Renal veins are patent. retroaortic left renal vein anatomy is noted. Common and external iliac veins are patent. Review of the MIP images confirms the above findings. NON-VASCULAR Lower chest: There is emphysema in the visualized lung bases. Hepatobiliary: Liver is within normal limits. Gallbladder is mildly distended with a maximal diameter of 5.1 cm. There is no wall thickening or gallstones. Pancreas: Unremarkable. Spleen: Unremarkable. Adrenals/Urinary Tract: Kidneys are within normal limits. Adrenal glands are markedly prominent bilaterally in a hyperplasia pattern without focal mass. Stomach/Bowel: Stomach is unremarkable. Normal appendix. There is no evidence of small-bowel obstruction. No obvious focal mass in the colon. Diverticulosis of the sigmoid colon is present. Sigmoid colon is decompressed. This artificially results in wall thickening. Lymphatic: No abnormal retroperitoneal adenopathy. Reproductive: Prostate is unremarkable. Bladder is within normal limits. Other: There is no free fluid. Right inguinal hernia contains adipose tissue in a tiny portion of the bladder. Musculoskeletal: T11 compression deformity involving the superior endplate has a chronic appearance. No vertebral compression deformity in the lumbar spine. Degenerative disc disease at L4-5 and L5-S1 is present. There is no destructive bone lesion. IMPRESSION: VASCULAR Abdominal  aorta is ectatic with a maximal diameter of 3.2 cm. Recommend followup by ultrasound in 3 years. This recommendation follows ACR consensus guidelines: White Paper of the ACR Incidental Findings Committee II on Vascular Findings. J Am Coll Radiol 2013; [id number] There are extensive atherosclerotic changes in the aorta and iliac arterial vasculature but no definitive focal stenosis to result in arterial insufficiency. Bilateral superficial femoral artery occlusion. NON-VASCULAR Mild gallbladder distention of unknown significance. No gallstones or mass is identified. Right inguinal hernia contains adipose tissue an a tiny portion of the bladder. Electronically Signed   By: Marybelle Killings M.D.   On: 06/10/2016 15:52     Assessment & Plan:   [name] was seen today for back pain.  Diagnoses and all orders for this visit:  Chronic pain syndrome  Chronic, continuous use of opioids  Facet arthritis of lumbar region  DDD (degenerative disc disease), lumbar -     MR LUMBAR SPINE LIMITED WO CONTRAST; Future  Chronic bilateral low back pain with right-sided sciatica -     MR LUMBAR SPINE LIMITED WO CONTRAST; Future  Chronic left hip pain  Spondylosis, lumbar, with myelopathy  Acute right-sided low back pain with right-sided sciatica  Cervicalgia  Acute bilateral low back pain with right-sided sciatica  Other orders -     fentaNYL (DURAGESIC - DOSED MCG/HR) 100 MCG/HR; Place 1 patch (100 mcg total) onto the skin every 3 (three) days. -     Discontinue: oxyCODONE-acetaminophen (PERCOCET) 7.5-325 MG tablet; Take 1  tablet by mouth every 4 (four) hours as needed for severe pain. -     oxyCODONE-acetaminophen (PERCOCET) 7.5-325 MG tablet; Take 1 tablet by mouth every 4 (four) hours as needed for severe pain.        ----------------------------------------------------------------------------------------------------------------------  Problem List Items Addressed This Visit      Unprioritized    DDD (degenerative disc disease), lumbar   Relevant Medications   fentaNYL (DURAGESIC - DOSED MCG/HR) 100 MCG/HR   oxyCODONE-acetaminophen (PERCOCET) 7.5-325 MG tablet   Other Relevant Orders   MR LUMBAR SPINE LIMITED WO CONTRAST   Facet arthritis of lumbar region   Relevant Medications   fentaNYL (DURAGESIC - DOSED MCG/HR) 100 MCG/HR   oxyCODONE-acetaminophen (PERCOCET) 7.5-325 MG tablet    Other Visit Diagnoses    Chronic pain syndrome    -  Primary   Chronic, continuous use of opioids       Chronic bilateral low back pain with right-sided sciatica       Relevant Medications   fentaNYL (DURAGESIC - DOSED MCG/HR) 100 MCG/HR   oxyCODONE-acetaminophen (PERCOCET) 7.5-325 MG tablet   Other Relevant Orders   MR LUMBAR SPINE LIMITED WO CONTRAST   Chronic left hip pain       Relevant Medications   fentaNYL (DURAGESIC - DOSED MCG/HR) 100 MCG/HR   oxyCODONE-acetaminophen (PERCOCET) 7.5-325 MG tablet   Spondylosis, lumbar, with myelopathy       Acute right-sided low back pain with right-sided sciatica       Relevant Medications   fentaNYL (DURAGESIC - DOSED MCG/HR) 100 MCG/HR   oxyCODONE-acetaminophen (PERCOCET) 7.5-325 MG tablet   Cervicalgia       Acute bilateral low back pain with right-sided sciatica       Relevant Medications   fentaNYL (DURAGESIC - DOSED MCG/HR) 100 MCG/HR   oxyCODONE-acetaminophen (PERCOCET) 7.5-325 MG tablet        ----------------------------------------------------------------------------------------------------------------------  1. Chronic pain syndrome We will have him continue his current medication therapy with refills given today.  I will allow him to increase his oxycodone to twice daily dosing for breakthrough pain.  He is also due for a refill on his Duragesic at June 22 and July 22.  We have reviewed the [facility] practitioner database information and it is appropriate.  2. Chronic, continuous use of opioids As above  3. Facet  arthritis of lumbar region As above  4. DDD (degenerative disc disease), lumbar Based on the severity of his pain and the increased weakness with extension of the right leg I am going to request an MRI to further elucidate any spinal pathology. - MR LUMBAR SPINE LIMITED WO CONTRAST; Future  5. Chronic bilateral low back pain with right-sided sciatica As above.  We have talked with him about opportunities for possible epidural injection to see if this could give him some relief with the L4-L5 sciatica symptoms. - MR LUMBAR SPINE LIMITED WO CONTRAST; Future  6. Chronic left hip pain   7. Spondylosis, lumbar, with myelopathy   8. Acute right-sided low back pain with right-sided sciatica   9. Cervicalgia   10. Acute bilateral low back pain with right-sided sciatica     ----------------------------------------------------------------------------------------------------------------------  I am having [name] maintain his docusate sodium, tamsulosin, gemfibrozil, lisinopril, metoprolol tartrate, butalbital-aspirin-caffeine, atorvastatin, busPIRone, citalopram, diazepam, aspirin, lidocaine, predniSONE, fentaNYL, and oxyCODONE-acetaminophen.   Meds ordered this encounter  Medications  . fentaNYL (DURAGESIC - DOSED MCG/HR) 100 MCG/HR    Sig: Place 1 patch (100 mcg total) onto the skin  every 3 (three) days.    Dispense:  10 patch    Refill:  0    Do not fill until [id number]  . DISCONTD: oxyCODONE-acetaminophen (PERCOCET) 7.5-325 MG tablet    Sig: Take 1 tablet by mouth every 4 (four) hours as needed for severe pain.    Dispense:  60 tablet    Refill:  0    Do not fill until [id number]  . oxyCODONE-acetaminophen (PERCOCET) 7.5-325 MG tablet    Sig: Take 1 tablet by mouth every 4 (four) hours as needed for severe pain.    Dispense:  40 tablet    Refill:  0    Do not fill until [id number]   Patient's Medications  New Prescriptions   No medications on file  Previous  Medications   ASPIRIN 325 MG EC TABLET    Take 325 mg by mouth daily.   ATORVASTATIN (LIPITOR) 10 MG TABLET    Take 20 mg by mouth daily.    BUSPIRONE (BUSPAR) 15 MG TABLET    Take 15 mg by mouth 3 (three) times daily.    BUTALBITAL-ASPIRIN-CAFFEINE (FIORINAL) 50-325-40 MG CAPSULE    Take 1 capsule by mouth every 6 (six) hours as needed for headache.    CITALOPRAM (CELEXA) 20 MG TABLET    Take 20 mg by mouth daily.   DIAZEPAM (VALIUM) 5 MG TABLET    Take 5 mg by mouth every 6 (six) hours as needed.    DOCUSATE SODIUM (COLACE) 100 MG CAPSULE    Take 100 mg by mouth 2 (two) times daily.   GEMFIBROZIL (LOPID) 600 MG TABLET    Take 600 mg by mouth 2 (two) times daily before a meal.   LIDOCAINE (XYLOCAINE) 5 % OINTMENT    as directed.    LISINOPRIL (PRINIVIL,ZESTRIL) 10 MG TABLET    Take 20 mg by mouth daily.    METOPROLOL TARTRATE (LOPRESSOR) 25 MG TABLET    Take 25 mg by mouth daily.   PREDNISONE (DELTASONE) 10 MG TABLET    Take 1 tablet (10 mg total) by mouth daily.   TAMSULOSIN (FLOMAX) 0.4 MG CAPS CAPSULE    Take 0.4 mg by mouth daily.  Modified Medications   Modified Medication Previous Medication   FENTANYL (DURAGESIC - DOSED MCG/HR) 100 MCG/HR fentaNYL (DURAGESIC - DOSED MCG/HR) 100 MCG/HR      Place 1 patch (100 mcg total) onto the skin every 3 (three) days.    Place 1 patch (100 mcg total) onto the skin every 3 (three) days.   OXYCODONE-ACETAMINOPHEN (PERCOCET) 7.5-325 MG TABLET oxyCODONE-acetaminophen (PERCOCET) 7.5-325 MG tablet      Take 1 tablet by mouth every 4 (four) hours as needed for severe pain.    Take 1 tablet by mouth every 4 (four) hours as needed for severe pain.  Discontinued Medications   No medications on file   ----------------------------------------------------------------------------------------------------------------------  Follow-up: Return by 0/720/2019, for evaluation, procedure.    Molli Barrows, MD

## 2017-10-17 ENCOUNTER — Telehealth: Payer: Self-pay

## 2017-10-17 NOTE — Telephone Encounter (Signed)
The patient wants to go to [place] for an open MRI. Dr. Andree Elk ordered it for [facility] and he ordered a limited MRI. [facility] does not do limited, so that needs to be changed.

## 2017-10-17 NOTE — Telephone Encounter (Signed)
Dr Andree Elk not in the office this week.  Unsure of reasoning behind Dr Andree Elk order.  Will consult with him when he returns to office.

## 2017-10-19 ENCOUNTER — Telehealth: Payer: Self-pay | Admitting: *Deleted

## 2017-10-20 ENCOUNTER — Other Ambulatory Visit: Payer: Self-pay | Admitting: Internal Medicine

## 2017-10-20 DIAGNOSIS — G8929 Other chronic pain: Secondary | ICD-10-CM

## 2017-10-20 DIAGNOSIS — M5441 Lumbago with sciatica, right side: Principal | ICD-10-CM

## 2017-10-20 DIAGNOSIS — M5442 Lumbago with sciatica, left side: Principal | ICD-10-CM

## 2017-10-24 NOTE — Telephone Encounter (Signed)
Spoke with patient and since seeing [place] he has seen Dr Doy Hutching and he has ordered an MRI that patient can tolerate.  She is going to call over this morning and see the status on the order.

## 2017-10-25 ENCOUNTER — Telehealth: Payer: Self-pay | Admitting: Anesthesiology

## 2017-10-25 ENCOUNTER — Other Ambulatory Visit: Payer: Self-pay | Admitting: *Deleted

## 2017-10-25 DIAGNOSIS — Z1389 Encounter for screening for other disorder: Secondary | ICD-10-CM

## 2017-10-25 DIAGNOSIS — Z0189 Encounter for other specified special examinations: Secondary | ICD-10-CM

## 2017-10-25 NOTE — Addendum Note (Signed)
Addended by: Molli Barrows on: 10/25/2017 01:27 PM   Modules accepted: Orders

## 2017-10-25 NOTE — Telephone Encounter (Signed)
[place] [phone] needs order for x-ray of [place] eyes because he has had metal removed before. Please call her to verify this is ok.

## 2017-10-25 NOTE — Telephone Encounter (Signed)
Order added per OK with Dr. Andree Elk.

## 2017-10-25 NOTE — Telephone Encounter (Signed)
Please ask Dr. Andree Elk to change this to unlimited MRI so I can send to [facility] imaging.

## 2017-11-07 ENCOUNTER — Telehealth: Payer: Self-pay

## 2017-11-07 NOTE — Telephone Encounter (Signed)
[name] wanted to update Dr Andree Elk about Red spot on [name] back, Dr Andree Elk recommended putting neosporin on red area but wife states when she applied the neosporin that the red area became larger, so she stopped putting it on and now it's back to the small red area Dr Andree Elk saw

## 2017-11-07 NOTE — Telephone Encounter (Signed)
Called patient to discuss what is going on with back.  Patient states the neosporin was aggravating the area that she and Dr Andree Elk discussed.  Stopped and now the spot is about the size of a dime.  Denies itching, or drainage from the area.  Patient's wife think that it may have something to do when he fell a few weeks ago.  Advised patient that if the area is getting better that we can wait until next appt.  However, told patient that we would be glad to see him sooner if she feels that is necessary.  Patient is having MRI done tomorrow for back pain.

## 2017-11-08 ENCOUNTER — Ambulatory Visit
Admission: RE | Admit: 2017-11-08 | Discharge: 2017-11-08 | Disposition: A | Discharge status: Home / Self Care | Payer: Medicare Other | Source: Ambulatory Visit | Attending: Anesthesiology | Admitting: Anesthesiology

## 2017-11-08 DIAGNOSIS — M5441 Lumbago with sciatica, right side: Secondary | ICD-10-CM

## 2017-11-08 DIAGNOSIS — Z1389 Encounter for screening for other disorder: Secondary | ICD-10-CM

## 2017-11-10 ENCOUNTER — Other Ambulatory Visit: Payer: Self-pay | Admitting: *Deleted

## 2017-11-10 ENCOUNTER — Telehealth: Payer: Self-pay | Admitting: *Deleted

## 2017-11-10 DIAGNOSIS — M5441 Lumbago with sciatica, right side: Secondary | ICD-10-CM

## 2017-11-10 NOTE — Telephone Encounter (Signed)
Patient's wife notified that there is an acute compression FX per MRI results. Notified Dr. Andree Elk, ordered LESI and ortho referral. Patient notified.

## 2017-11-14 ENCOUNTER — Telehealth: Payer: Self-pay | Admitting: *Deleted

## 2017-11-14 NOTE — Telephone Encounter (Signed)
Patient has questions about Kyphoplasty. Wife has already looked up info about this and they have no further questions.

## 2017-11-30 ENCOUNTER — Ambulatory Visit: Payer: Medicare Other | Admitting: Anesthesiology

## 2017-12-01 ENCOUNTER — Ambulatory Visit: Payer: Medicare Other | Attending: Anesthesiology | Admitting: Anesthesiology

## 2017-12-01 ENCOUNTER — Encounter: Payer: Self-pay | Admitting: Anesthesiology

## 2017-12-01 VITALS — BP 126/84 | HR 66 | Temp 98.3°F | Resp 16 | Ht 69.0 in | Wt 174.0 lb

## 2017-12-01 DIAGNOSIS — S32019D Unspecified fracture of first lumbar vertebra, subsequent encounter for fracture with routine healing: Secondary | ICD-10-CM | POA: Insufficient documentation

## 2017-12-01 DIAGNOSIS — G894 Chronic pain syndrome: Secondary | ICD-10-CM | POA: Diagnosis not present

## 2017-12-01 DIAGNOSIS — M47816 Spondylosis without myelopathy or radiculopathy, lumbar region: Secondary | ICD-10-CM

## 2017-12-01 DIAGNOSIS — F1190 Opioid use, unspecified, uncomplicated: Secondary | ICD-10-CM | POA: Diagnosis not present

## 2017-12-01 DIAGNOSIS — M25552 Pain in left hip: Secondary | ICD-10-CM | POA: Insufficient documentation

## 2017-12-01 DIAGNOSIS — M4850XA Collapsed vertebra, not elsewhere classified, site unspecified, initial encounter for fracture: Secondary | ICD-10-CM

## 2017-12-01 DIAGNOSIS — M5441 Lumbago with sciatica, right side: Secondary | ICD-10-CM | POA: Insufficient documentation

## 2017-12-01 DIAGNOSIS — W19XXXD Unspecified fall, subsequent encounter: Secondary | ICD-10-CM | POA: Diagnosis not present

## 2017-12-01 DIAGNOSIS — M5106 Intervertebral disc disorders with myelopathy, lumbar region: Secondary | ICD-10-CM | POA: Diagnosis not present

## 2017-12-01 DIAGNOSIS — Z79891 Long term (current) use of opiate analgesic: Secondary | ICD-10-CM | POA: Insufficient documentation

## 2017-12-01 DIAGNOSIS — M5136 Other intervertebral disc degeneration, lumbar region: Secondary | ICD-10-CM

## 2017-12-01 DIAGNOSIS — G8929 Other chronic pain: Secondary | ICD-10-CM

## 2017-12-01 DIAGNOSIS — M4716 Other spondylosis with myelopathy, lumbar region: Secondary | ICD-10-CM

## 2017-12-01 MED ORDER — FENTANYL 100 MCG/HR TD PT72
100.0000 ug | MEDICATED_PATCH | TRANSDERMAL | 0 refills | Status: DC
Start: 2017-12-01 — End: 2018-02-13

## 2017-12-01 MED ORDER — OXYCODONE-ACETAMINOPHEN 7.5-325 MG PO TABS: 1.0000 | ORAL_TABLET | Freq: Two times a day (BID) | ORAL | 0 refills | Status: DC

## 2017-12-01 MED ORDER — FENTANYL 100 MCG/HR TD PT72
100.0000 ug | MEDICATED_PATCH | TRANSDERMAL | 0 refills | Status: DC
Start: 2017-12-01 — End: 2017-12-01

## 2017-12-01 NOTE — Patient Instructions (Signed)
You were given 2 months worth of Fentanyl 140mcg/hr Oxycodone - apap 7.5-325 mg x 15 days  Reduce ASA intake prior to procedures.  Fiorinal two times per day 7 days prior to procedure.   ____________________________________________________________________________________________  General Risks and Possible Complications  Patient Responsibilities: It is important that you read this as it is part of your informed consent. It is our duty to inform you of the risks and possible complications associated with treatments offered to you. It is your responsibility as a patient to read this and to ask questions about anything that is not clear or that you believe was not covered in this document.  Patient's Rights: You have the right to refuse treatment. You also have the right to change your mind, even after initially having agreed to have the treatment done. However, under this last option, if you wait until the last second to change your mind, you may be charged for the materials used up to that point.  Introduction: Medicine is not an Chief Strategy Officer. Everything in Medicine, including the lack of treatment(s), carries the potential for danger, harm, or loss (which is by definition: Risk). In Medicine, a complication is a secondary problem, condition, or disease that can aggravate an already existing one. All treatments carry the risk of possible complications. The fact that a side effects or complications occurs, does not imply that the treatment was conducted incorrectly. It must be clearly understood that these can happen even when everything is done following the highest safety standards.  No treatment: You can choose not to proceed with the proposed treatment alternative. The "PRO(s)" would include: avoiding the risk of complications associated with the therapy. The "CON(s)" would include: not getting any of the treatment benefits. These benefits fall under one of three categories: diagnostic;  therapeutic; and/or palliative. Diagnostic benefits include: getting information which can ultimately lead to improvement of the disease or symptom(s). Therapeutic benefits are those associated with the successful treatment of the disease. Finally, palliative benefits are those related to the decrease of the primary symptoms, without necessarily curing the condition (example: decreasing the pain from a flare-up of a chronic condition, such as incurable terminal cancer).  General Risks and Complications: These are associated to most interventional treatments. They can occur alone, or in combination. They fall under one of the following six (6) categories: no benefit or worsening of symptoms; bleeding; infection; nerve damage; allergic reactions; and/or death. 1. No benefits or worsening of symptoms: In Medicine there are no guarantees, only probabilities. No healthcare provider can ever guarantee that a medical treatment will work, they can only state the probability that it may. Furthermore, there is always the possibility that the condition may worsen, either directly, or indirectly, as a consequence of the treatment. 2. Bleeding: This is more common if the patient is taking a blood thinner, either prescription or over the counter (example: Goody Powders, Fish oil, Aspirin, Garlic, etc.), or if suffering a condition associated with impaired coagulation (example: Hemophilia, cirrhosis of the liver, low platelet counts, etc.). However, even if you do not have one on these, it can still happen. If you have any of these conditions, or take one of these drugs, make sure to notify your treating physician. 3. Infection: This is more common in patients with a compromised immune system, either due to disease (example: diabetes, cancer, human immunodeficiency virus [HIV], etc.), or due to medications or treatments (example: therapies used to treat cancer and rheumatological diseases). However, even if you do not  have  one on these, it can still happen. If you have any of these conditions, or take one of these drugs, make sure to notify your treating physician. 4. Nerve Damage: This is more common when the treatment is an invasive one, but it can also happen with the use of medications, such as those used in the treatment of cancer. The damage can occur to small secondary nerves, or to large primary ones, such as those in the spinal cord and brain. This damage may be temporary or permanent and it may lead to impairments that can range from temporary numbness to permanent paralysis and/or brain death. 5. Allergic Reactions: Any time a substance or material comes in contact with our body, there is the possibility of an allergic reaction. These can range from a mild skin rash (contact dermatitis) to a severe systemic reaction (anaphylactic reaction), which can result in death. 6. Death: In general, any medical intervention can result in death, most of the time due to an unforeseen complication. ____________________________________________________________________________________________  Epidural Steroid Injection Patient Information  Description: The epidural space surrounds the nerves as they exit the spinal cord.  In some patients, the nerves can be compressed and inflamed by a bulging disc or a tight spinal canal (spinal stenosis).  By injecting steroids into the epidural space, we can bring irritated nerves into direct contact with a potentially helpful medication.  These steroids act directly on the irritated nerves and can reduce swelling and inflammation which often leads to decreased pain.  Epidural steroids may be injected anywhere along the spine and from the neck to the low back depending upon the location of your pain.   After numbing the skin with local anesthetic (like Novocaine), a small needle is passed into the epidural space slowly.  You may experience a sensation of pressure while this is being done.  The  entire block usually last less than 10 minutes.  Conditions which may be treated by epidural steroids:   Low back and leg pain  Neck and arm pain  Spinal stenosis  Post-laminectomy syndrome  Herpes zoster (shingles) pain  Pain from compression fractures  Preparation for the injection:  1. Do not eat any solid food or dairy products within 8 hours of your appointment.  2. You may drink clear liquids up to 3 hours before appointment.  Clear liquids include water, black coffee, juice or soda.  No milk or cream please. 3. You may take your regular medication, including pain medications, with a sip of water before your appointment  Diabetics should hold regular insulin (if taken separately) and take 1/2 normal NPH dos the morning of the procedure.  Carry some sugar containing items with you to your appointment. 4. A driver must accompany you and be prepared to drive you home after your procedure.  5. Bring all your current medications with your. 6. An IV may be inserted and sedation may be given at the discretion of the physician.   7. A blood pressure cuff, EKG and other monitors will often be applied during the procedure.  Some patients may need to have extra oxygen administered for a short period. 8. You will be asked to provide medical information, including your allergies, prior to the procedure.  We must know immediately if you are taking blood thinners (like Coumadin/Warfarin)  Or if you are allergic to IV iodine contrast (dye). We must know if you could possible be pregnant.  Possible side-effects:  Bleeding from needle site  Infection (rare,  may require surgery)  Nerve injury (rare)  Numbness & tingling (temporary)  Difficulty urinating (rare, temporary)  Spinal headache ( a headache worse with upright posture)  Light -headedness (temporary)  Pain at injection site (several days)  Decreased blood pressure (temporary)  Weakness in arm/leg (temporary)  Pressure  sensation in back/neck (temporary)  Call if you experience:  Fever/chills associated with headache or increased back/neck pain.  Headache worsened by an upright position.  New onset weakness or numbness of an extremity below the injection site  Hives or difficulty breathing (go to the emergency room)  Inflammation or drainage at the infection site  Severe back/neck pain  Any new symptoms which are concerning to you  Please note:  Although the local anesthetic injected can often make your back or neck feel good for several hours after the injection, the pain will likely return.  It takes 3-7 days for steroids to work in the epidural space.  You may not notice any pain relief for at least that one week.  If effective, we will often do a series of three injections spaced 3-6 weeks apart to maximally decrease your pain.  After the initial series, we generally will wait several months before considering a repeat injection of the same type.  If you have any questions, please call [phone] [facility]

## 2017-12-01 NOTE — Progress Notes (Signed)
Nursing Pain Medication Assessment:  Safety precautions to be maintained throughout the outpatient stay will include: orient to surroundings, keep bed in low position, maintain call bell within reach at all times, provide assistance with transfer out of bed and ambulation.  Medication Inspection Compliance: Pill count conducted under aseptic conditions, in front of the patient. Neither the pills nor the bottle was removed from the patient's sight at any time. Once count was completed pills were immediately returned to the patient in their original bottle.  Medication #1: Hydrocodone/APAP Pill/Patch Count: 0 of 40 pills remain Pill/Patch Appearance: Markings consistent with prescribed medication Bottle Appearance: Standard pharmacy container. Clearly labeled. Filled Date: 07 / 19 / 2019 Last Medication intake:  states he ran out approx 10 days ago  Medication #2: Fentanyl patch Pill/Patch Count: 4 of 10 pills remain Pill/Patch Appearance: Markings consistent with prescribed medication Bottle Appearance: Standard pharmacy container. Clearly labeled. Filled Date: 07 / 20 / 2019 Last Medication intake:  Yesterday

## 2017-12-01 NOTE — Progress Notes (Signed)
Subjective:  Patient ID: [name], male    DOB: Sep 07, 1945  Age: 72 y.o. MRN: [id number]  CC: Back Pain (lower lumbar and there is a knot in thoracic region that is palpable on the spine about the size of a grape )   Procedure: None  HPI DERREK PUFF presents for reevaluation.  He continues to have severe mid lower back pain of the same quality characteristic distribution as previously documented.  His July MRI shows evidence of an L1-L2 vertebral compression fracture after a traumatic fall.  He has not experienced any change in lower extremity strength or function beyond his baseline weakness in his right leg which is been present for the past 10 to 12 years.  He has chronic low back pain and is treated here at the clinic for that.  He has been on fentanyl 100 mcg Duragesic patches for that.  He is also been taking Percocet for breakthrough acute pain on top of his chronic low back pain.  No change in bowel or bladder function is noted at this time.  He is tolerating the medications well and they are helping with his pain relief but despite this he continues to have worsening lower back pain.  He was scheduled today for a lumbar epidural to see if we can get some relief of his pain however he is taking Fiorinal 6 times a day in addition to periodic aspirin as well.  Based on his narcotic assessment sheet he is deriving good functional lifestyle improvement with his medications without evidence of any diverting or illicit use.  No side effects are reported.  Outpatient Medications Prior to Visit  Medication Sig Dispense Refill  . aspirin 325 MG EC tablet Take 325 mg by mouth daily.     Marland Kitchen atorvastatin (LIPITOR) 10 MG tablet Take 20 mg by mouth daily.     . busPIRone (BUSPAR) 15 MG tablet Take 15 mg by mouth 3 (three) times daily.     . butalbital-aspirin-caffeine (FIORINAL) 50-325-40 MG capsule Take 1 capsule by mouth every 6 (six) hours as needed for headache.     . citalopram  (CELEXA) 20 MG tablet Take 20 mg by mouth daily.  0  . diazepam (VALIUM) 5 MG tablet Take 5 mg by mouth every 6 (six) hours as needed.     . docusate sodium (COLACE) 100 MG capsule Take 100 mg by mouth 2 (two) times daily.    Marland Kitchen gemfibrozil (LOPID) 600 MG tablet Take 600 mg by mouth 2 (two) times daily before a meal.    . lidocaine (XYLOCAINE) 5 % ointment as directed.     Marland Kitchen lisinopril (PRINIVIL,ZESTRIL) 10 MG tablet Take 20 mg by mouth daily.     . metoprolol tartrate (LOPRESSOR) 25 MG tablet Take 25 mg by mouth daily.    . tamsulosin (FLOMAX) 0.4 MG CAPS capsule Take 0.4 mg by mouth daily.    . fentaNYL (DURAGESIC - DOSED MCG/HR) 100 MCG/HR Place 1 patch (100 mcg total) onto the skin every 3 (three) days. 10 patch 0  . oxyCODONE-acetaminophen (PERCOCET) 7.5-325 MG tablet Take 1 tablet by mouth every 4 (four) hours.    . predniSONE (DELTASONE) 10 MG tablet Take 1 tablet (10 mg total) by mouth daily. (Patient not taking: Reported on 10/12/2017) 42 tablet 0   No facility-administered medications prior to visit.     Review of Systems CNS: No confusion or sedation Cardiac: No angina or palpitations GI: No abdominal pain or constipation  Contusion: No nausea vomiting fevers or chills  Objective:  BP 126/84 (BP Location: Right Arm, Patient Position: Sitting, Cuff Size: Normal)   Pulse 66   Temp 98.3 F (36.8 C) (Oral)   Resp 16   Ht 5\' 9"  (1.753 m)   Wt 174 lb (78.9 kg)   SpO2 100%   BMI 25.70 kg/m    BP Readings from Last 3 Encounters:  12/01/17 126/84  10/12/17 (!) 153/65  09/26/17 125/68     Wt Readings from Last 3 Encounters:  12/01/17 174 lb (78.9 kg)  10/12/17 175 lb (79.4 kg)  09/26/17 175 lb (79.4 kg)     Physical Exam Pt is alert and oriented PERRL EOMI HEART IS RRR no murmur or rub LCTA no wheezing or rales MUSCULOSKELETAL reveals some paraspinous muscle tenderness in the bilateral lumbar region.  He is losing some of his lumbar lordosis and he walks with an  antalgic gait but his length remains at baseline.  Labs  No results found for: HGBA1C Lab Results  Component Value Date   CREATININE 1.20 06/10/2016    -------------------------------------------------------------------------------------------------------------------- Lab Results  Component Value Date   CREATININE 1.20 06/10/2016    --------------------------------------------------------------------------------------------------------------------- Dg Eye Foreign Body  Result Date: 11/08/2017 CLINICAL DATA:  Metal working/exposure; clearance prior to MRI EXAM: ORBITS FOR FOREIGN BODY - 2 VIEW COMPARISON:  None. FINDINGS: There is no evidence of metallic foreign body within the orbits. No significant bone abnormality identified. IMPRESSION: No evidence of metallic foreign body within the orbits. Electronically Signed   By: Titus Dubin M.D.   On: 11/08/2017 11:19   Mr Lumbar Spine Wo Contrast  Result Date: 11/08/2017 CLINICAL DATA:  Low back pain, bilateral leg weakness and difficulty walking since a fall moving sand bags 09/21/2017. Initial encounter. EXAM: MRI LUMBAR SPINE WITHOUT CONTRAST TECHNIQUE: Multiplanar, multisequence MR imaging of the lumbar spine was performed. No intravenous contrast was administered. COMPARISON:  None. FINDINGS: Segmentation:  Standard. Alignment:  Maintained. Vertebrae: The patient has a compression fracture of L1 with vertebra plana deformity present in the anterior 2/3 of the vertebral body. There is marrow edema within the body. The patient's fracture does not involve the posterior elements. Minimal marrow edema in the posterior aspect of the L2 vertebral body eccentric to the right could be due to degenerative endplate signal change or microfracturing. Bone marrow signal is otherwise unremarkable. Conus medullaris and cauda equina: Conus extends to the L2 level. Conus and cauda equina appear normal. Paraspinal and other soft tissues: The descending  abdominal aorta measures up to 3 cm in diameter. Disc levels: T9-10 and T10-11 are imaged in the sagittal plane only and negative. T11-12: Negative. T12-L1: Bony retropulsion off the superior endplate of L1 narrows but does not efface the ventral thecal sac. The central canal and foramina appear open. L1-2: Negative. L2-3: Very shallow left paracentral protrusion. The central canal and foramina are open. L3-4: There is a shallow disc bulge and mild facet degenerative disease without stenosis. L4-5: Loss of disc space height is identified. Shallow disc bulge and endplate spur are eccentric to the left. The central spinal canal and neural foramina remain open. L5-S1: Very shallow central disc protrusion without stenosis. IMPRESSION: The examination is positive for an acute or subacute compression fracture of L1 with vertebra plana deformity in the anterior 2/3 of the vertebral body. There is some bony retropulsion off the posterior aspect of L1 which narrows the ventral thecal sac but does not compress the distal cord or cause  stenosis. The fracture has features most consistent with a posttraumatic or senile osteoporotic injury. Mild degenerative endplate signal change versus a small focus of micro fracturing in the posterior aspect of the L2 vertebral body. Mild degenerative disc disease without stenosis. 3 cm abdominal aortic aneurysm. Recommend followup by ultrasound in 3 years. This recommendation follows ACR consensus guidelines: White Paper of the ACR Incidental Findings Committee II on Vascular Findings. [facility] Coll Radiol 2013; [phone] Electronically Signed   By: Inge Rise M.D.   On: 11/08/2017 12:48     Assessment & Plan:   Vinicio was seen today for back pain.  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 -     Discontinue: fentaNYL (DURAGESIC - DOSED MCG/HR) 100 MCG/HR; Place 1 patch (100 mcg total) onto the skin every 3 (three) days. -     fentaNYL (DURAGESIC - DOSED MCG/HR) 100 MCG/HR; Place 1 patch (100 mcg total) onto the skin every 3 (three) days.        ----------------------------------------------------------------------------------------------------------------------  Problem List Items Addressed This Visit      Unprioritized   DDD (degenerative disc disease), lumbar   Relevant Medications   oxyCODONE-acetaminophen (PERCOCET) 7.5-325 MG tablet   fentaNYL (DURAGESIC - DOSED MCG/HR) 100 MCG/HR   Facet arthritis of lumbar region   Relevant Medications   oxyCODONE-acetaminophen (PERCOCET) 7.5-325 MG tablet   fentaNYL (DURAGESIC - DOSED MCG/HR) 100 MCG/HR    Other Visit Diagnoses    Acute bilateral low back pain with right-sided sciatica    -  Primary   Relevant Medications   oxyCODONE-acetaminophen (PERCOCET) 7.5-325 MG tablet   fentaNYL (DURAGESIC - DOSED MCG/HR) 100 MCG/HR   Chronic pain syndrome       Chronic, continuous use of opioids       Chronic bilateral low back pain with right-sided sciatica       Relevant Medications   oxyCODONE-acetaminophen (PERCOCET) 7.5-325 MG tablet   fentaNYL (DURAGESIC - DOSED MCG/HR) 100 MCG/HR   Chronic left hip pain       Relevant Medications   oxyCODONE-acetaminophen (PERCOCET) 7.5-325 MG tablet   fentaNYL (DURAGESIC - DOSED MCG/HR) 100 MCG/HR   Spondylosis, lumbar, with myelopathy       Vertebral compression fracture (HCC)             ----------------------------------------------------------------------------------------------------------------------  1. Acute bilateral low back pain with right-sided sciatica I am going to reschedule him for a 2-week return for a lumbar epidural steroid injection.  I want him to discontinue the additional aspirin and reduce his Fiorinal out to twice a day for the 7 days prior to the procedure.  We will refill his medications for the Percocet for twice daily dosing and refill his Duragesic patches as well.  Have reviewed the [facility] practitioner database information and it is appropriate.  2. Chronic pain syndrome As above  3. Chronic, continuous use of opioids As above  4. Facet arthritis of lumbar region   5. DDD (degenerative disc disease), lumbar   6. Chronic bilateral low  back pain with right-sided sciatica   7. Chronic left hip pain   8. Spondylosis, lumbar, with myelopathy   9. Vertebral compression fracture (HCC) As above.  We also may refer him to Dr. Rudene Christians for evaluation and possible kyphoplasty if indicated.  She is scheduled for return in 2 weeks for a lumbar epidural steroid injection    ----------------------------------------------------------------------------------------------------------------------  I have changed Doren Custard B. Hann's oxyCODONE-acetaminophen. I am also having him maintain his docusate sodium, tamsulosin, gemfibrozil, lisinopril, metoprolol tartrate, butalbital-aspirin-caffeine, atorvastatin, busPIRone, citalopram, diazepam, aspirin, lidocaine, predniSONE, and fentaNYL.   Meds ordered this encounter  Medications  . oxyCODONE-acetaminophen (PERCOCET) 7.5-325 MG tablet    Sig: Take 1 tablet by mouth 2 (two) times daily for 15 days.    Dispense:  30 tablet    Refill:  0  . DISCONTD: fentaNYL (DURAGESIC - DOSED MCG/HR) 100 MCG/HR    Sig: Place 1 patch (100 mcg total) onto the skin every 3 (three) days.    Dispense:  10  patch    Refill:  0    Do not fill until [id number]  . fentaNYL (DURAGESIC - DOSED MCG/HR) 100 MCG/HR    Sig: Place 1 patch (100 mcg total) onto the skin every 3 (three) days.    Dispense:  10 patch    Refill:  0    Do not fill until [id number]   Patient's Medications  New Prescriptions   No medications on file  Previous Medications   ASPIRIN 325 MG EC TABLET    Take 325 mg by mouth daily.    ATORVASTATIN (LIPITOR) 10 MG TABLET    Take 20 mg by mouth daily.    BUSPIRONE (BUSPAR) 15 MG TABLET    Take 15 mg by mouth 3 (three) times daily.    BUTALBITAL-ASPIRIN-CAFFEINE (FIORINAL) 50-325-40 MG CAPSULE    Take 1 capsule by mouth every 6 (six) hours as needed for headache.    CITALOPRAM (CELEXA) 20 MG TABLET    Take 20 mg by mouth daily.   DIAZEPAM (VALIUM) 5 MG TABLET    Take 5 mg by mouth every 6 (six) hours as needed.    DOCUSATE SODIUM (COLACE) 100 MG CAPSULE    Take 100 mg by mouth 2 (two) times daily.   GEMFIBROZIL (LOPID) 600 MG TABLET    Take 600 mg by mouth 2 (two) times daily before a meal.   LIDOCAINE (XYLOCAINE) 5 % OINTMENT    as directed.    LISINOPRIL (PRINIVIL,ZESTRIL) 10 MG TABLET    Take 20 mg by mouth daily.    METOPROLOL TARTRATE (LOPRESSOR) 25 MG TABLET    Take 25 mg by mouth daily.   PREDNISONE (DELTASONE) 10 MG TABLET    Take 1 tablet (10 mg total) by mouth daily.   TAMSULOSIN (FLOMAX) 0.4 MG CAPS CAPSULE    Take 0.4 mg by mouth daily.  Modified Medications   Modified Medication Previous Medication   FENTANYL (DURAGESIC - DOSED MCG/HR) 100 MCG/HR fentaNYL (DURAGESIC - DOSED MCG/HR) 100 MCG/HR      Place 1 patch (100 mcg total) onto the skin every 3 (three) days.    Place 1 patch (100 mcg total) onto the skin every 3 (three) days.   OXYCODONE-ACETAMINOPHEN (PERCOCET) 7.5-325 MG TABLET oxyCODONE-acetaminophen (PERCOCET) 7.5-325 MG tablet      Take 1 tablet by mouth 2 (two) times daily for 15 days.    Take 1 tablet by mouth every 4 (four) hours.  Discontinued Medications    No medications  on file   ----------------------------------------------------------------------------------------------------------------------  Follow-up: Return in about 2 weeks (around 12/15/2017) for procedure.    Molli Barrows, MD

## 2017-12-15 ENCOUNTER — Ambulatory Visit: Payer: Medicare Other | Attending: Anesthesiology | Admitting: Anesthesiology

## 2017-12-15 ENCOUNTER — Other Ambulatory Visit: Payer: Self-pay

## 2017-12-15 ENCOUNTER — Encounter: Payer: Self-pay | Admitting: Anesthesiology

## 2017-12-15 VITALS — BP 118/68 | HR 79 | Temp 98.5°F | Resp 18 | Ht 68.0 in | Wt 174.0 lb

## 2017-12-15 DIAGNOSIS — Z7982 Long term (current) use of aspirin: Secondary | ICD-10-CM | POA: Diagnosis not present

## 2017-12-15 DIAGNOSIS — Z79891 Long term (current) use of opiate analgesic: Secondary | ICD-10-CM | POA: Insufficient documentation

## 2017-12-15 DIAGNOSIS — M47816 Spondylosis without myelopathy or radiculopathy, lumbar region: Secondary | ICD-10-CM

## 2017-12-15 DIAGNOSIS — R531 Weakness: Secondary | ICD-10-CM | POA: Diagnosis not present

## 2017-12-15 DIAGNOSIS — R51 Headache: Secondary | ICD-10-CM | POA: Diagnosis not present

## 2017-12-15 DIAGNOSIS — Z79899 Other long term (current) drug therapy: Secondary | ICD-10-CM | POA: Diagnosis not present

## 2017-12-15 DIAGNOSIS — M1388 Other specified arthritis, other site: Secondary | ICD-10-CM | POA: Diagnosis not present

## 2017-12-15 DIAGNOSIS — M542 Cervicalgia: Secondary | ICD-10-CM | POA: Insufficient documentation

## 2017-12-15 DIAGNOSIS — M5116 Intervertebral disc disorders with radiculopathy, lumbar region: Secondary | ICD-10-CM | POA: Insufficient documentation

## 2017-12-15 DIAGNOSIS — M5441 Lumbago with sciatica, right side: Secondary | ICD-10-CM | POA: Diagnosis not present

## 2017-12-15 DIAGNOSIS — F1190 Opioid use, unspecified, uncomplicated: Secondary | ICD-10-CM

## 2017-12-15 DIAGNOSIS — G894 Chronic pain syndrome: Secondary | ICD-10-CM | POA: Diagnosis not present

## 2017-12-15 DIAGNOSIS — M4850XA Collapsed vertebra, not elsewhere classified, site unspecified, initial encounter for fracture: Secondary | ICD-10-CM

## 2017-12-15 DIAGNOSIS — S32019A Unspecified fracture of first lumbar vertebra, initial encounter for closed fracture: Secondary | ICD-10-CM | POA: Diagnosis not present

## 2017-12-15 DIAGNOSIS — M5136 Other intervertebral disc degeneration, lumbar region: Secondary | ICD-10-CM

## 2017-12-15 DIAGNOSIS — W19XXXA Unspecified fall, initial encounter: Secondary | ICD-10-CM | POA: Insufficient documentation

## 2017-12-15 DIAGNOSIS — M545 Low back pain: Secondary | ICD-10-CM | POA: Diagnosis present

## 2017-12-15 MED ORDER — OXYCODONE HCL 5 MG PO TABS: 5.0000 mg | ORAL_TABLET | ORAL | 0 refills | Status: DC | PRN

## 2017-12-15 MED ORDER — BUTALBITAL-APAP-CAFFEINE 50-325-40 MG PO TABS: 1.0000 | ORAL_TABLET | Freq: Four times a day (QID) | ORAL | 0 refills | Status: DC | PRN

## 2017-12-15 MED ORDER — OXYCODONE-ACETAMINOPHEN 7.5-325 MG PO TABS: 1.0000 | ORAL_TABLET | Freq: Four times a day (QID) | ORAL | 0 refills | Status: AC | PRN

## 2017-12-15 NOTE — Progress Notes (Signed)
Nursing Pain Medication Assessment:  Safety precautions to be maintained throughout the outpatient stay will include: orient to surroundings, keep bed in low position, maintain call bell within reach at all times, provide assistance with transfer out of bed and ambulation.  Medication Inspection Compliance: Pill count conducted under aseptic conditions, in front of the patient. Neither the pills nor the bottle was removed from the patient's sight at any time. Once count was completed pills were immediately returned to the patient in their original bottle.  Medication #1: Fentanyl patch Pill/Patch Count: 14 of 20 pills remain Pill/Patch Appearance: Markings consistent with prescribed medication Bottle Appearance: Standard pharmacy container. Clearly labeled. Filled Date: 08/17 / 2019 Last Medication intake:  Today  Medication #2: Oxycodone/APAP Pill/Patch Count: 0 of 30 pills remain Pill/Patch Appearance: Markings consistent with prescribed medication Bottle Appearance: Standard pharmacy container. Clearly labeled. Filled Date: 08/08 / 2019 Last Medication intake:  Ran out of medicine more than 48 hours ago

## 2017-12-15 NOTE — Patient Instructions (Addendum)
____________________________________________________________________________________________  Preparing for Procedure with Sedation  Instructions: . Oral Intake: Do not eat or drink anything for at least 8 hours prior to your procedure. . Transportation: Public transportation is not allowed. Bring an adult driver. The driver must be physically present in our waiting room before any procedure can be started. . Physical Assistance: Bring an adult physically capable of assisting you, in the event you need help. This adult should keep you company at home for at least 6 hours after the procedure. . Blood Pressure Medicine: Take your blood pressure medicine with a sip of water the morning of the procedure. . Blood thinners: Notify our staff if you are taking any blood thinners. Depending on which one you take, there will be specific instructions on how and when to stop it. . Diabetics on insulin: Notify the staff so that you can be scheduled 1st case in the morning. If your diabetes requires high dose insulin, take only  of your normal insulin dose the morning of the procedure and notify the staff that you have done so. . Preventing infections: Shower with an antibacterial soap the morning of your procedure. . Build-up your immune system: Take 1000 mg of Vitamin C with every meal (3 times a day) the day prior to your procedure. . Antibiotics: Inform the staff if you have a condition or reason that requires you to take antibiotics before dental procedures. . Pregnancy: If you are pregnant, call and cancel the procedure. . Sickness: If you have a cold, fever, or any active infections, call and cancel the procedure. . Arrival: You must be in the facility at least 30 minutes prior to your scheduled procedure. . Children: Do not bring children with you. . Dress appropriately: Bring dark clothing that you would not mind if they get stained. . Valuables: Do not bring any jewelry or valuables.  Procedure  appointments are reserved for interventional treatments only. . No Prescription Refills. . No medication changes will be discussed during procedure appointments. . No disability issues will be discussed.  Reasons to call and reschedule or cancel your procedure: (Following these recommendations will minimize the risk of a serious complication.) . Surgeries: Avoid having procedures within 2 weeks of any surgery. (Avoid for 2 weeks before or after any surgery). . Flu Shots: Avoid having procedures within 2 weeks of a flu shots or . (Avoid for 2 weeks before or after immunizations). . Barium: Avoid having a procedure within 7-10 days after having had a radiological study involving the use of radiological contrast. (Myelograms, Barium swallow or enema study). . Heart attacks: Avoid any elective procedures or surgeries for the initial 6 months after a "Myocardial Infarction" (Heart Attack). . Blood thinners: It is imperative that you stop these medications before procedures. Let us know if you if you take any blood thinner.  . Infection: Avoid procedures during or within two weeks of an infection (including chest colds or gastrointestinal problems). Symptoms associated with infections include: Localized redness, fever, chills, night sweats or profuse sweating, burning sensation when voiding, cough, congestion, stuffiness, runny nose, sore throat, diarrhea, nausea, vomiting, cold or Flu symptoms, recent or current infections. It is specially important if the infection is over the area that we intend to treat. . Heart and lung problems: Symptoms that may suggest an active cardiopulmonary problem include: cough, chest pain, breathing difficulties or shortness of breath, dizziness, ankle swelling, uncontrolled high or unusually low blood pressure, and/or palpitations. If you are experiencing any of these symptoms, cancel   your procedure and contact your primary care physician for an evaluation.  Remember:   Regular Business hours are:  Monday to Thursday 8:00 AM to 4:00 PM  Provider's Schedule: Milinda Pointer, MD:  Procedure days: Tuesday and Thursday 7:30 AM to 4:00 PM  Gillis Santa, MD:  Procedure days: Monday and Wednesday 7:30 AM to 4:00 PM ____________________________________________________________________________________________ Do not take Aspirin products 1 week before procedure. You were given one prescription each for Fiorcet and Oxycodone.

## 2017-12-15 NOTE — Progress Notes (Signed)
Safety precautions to be maintained throughout the outpatient stay will include: orient to surroundings, keep bed in low position, maintain call bell within reach at all times, provide assistance with transfer out of bed and ambulation.  

## 2017-12-16 NOTE — Progress Notes (Signed)
Subjective:  Patient ID: [name], male    DOB: March 31, 1946  Age: 72 y.o. MRN: [id number]  CC: Back Pain (low right)   Procedure: None  HPI [name] presents for reevaluation.  He was last seen in August for his acute exacerbation of low back pain on chronic low back pain.  He sustained a fall and has had severe pain radiating from the low back down into the posterior lateral right lower leg.  He had some chronic right lower leg weakness which is been present for greater than 10 years.  No change from that baseline is noted but he does have more give way pain related weakness.  His bowel and bladder function is been stable.  He has been taking his medications as prescribed including the Duragesic for his baseline chronic low back pain.  These have worked well for him and based on his narcotic assessment sheet these continue to help him with his baseline and management.  However he is had worsening low back pain for the last several weeks and despite the oxycodone he continues to have breakthrough pain during the day.  He is also taking Fiorinal for chronic headaches as prescribed by Dr. sParks.  Outpatient Medications Prior to Visit  Medication Sig Dispense Refill  . aspirin 325 MG EC tablet Take 325 mg by mouth daily.     Marland Kitchen atorvastatin (LIPITOR) 10 MG tablet Take 20 mg by mouth daily.     . busPIRone (BUSPAR) 15 MG tablet Take 15 mg by mouth 3 (three) times daily.     . butalbital-aspirin-caffeine (FIORINAL) 50-325-40 MG capsule Take 1 capsule by mouth every 6 (six) hours as needed for headache.     . citalopram (CELEXA) 20 MG tablet Take 20 mg by mouth daily.  0  . diazepam (VALIUM) 5 MG tablet Take 5 mg by mouth every 6 (six) hours as needed.     . docusate sodium (COLACE) 100 MG capsule Take 100 mg by mouth 2 (two) times daily.    . fentaNYL (DURAGESIC - DOSED MCG/HR) 100 MCG/HR Place 1 patch (100 mcg total) onto the skin every 3 (three) days. 10 patch 0  . gemfibrozil  (LOPID) 600 MG tablet Take 600 mg by mouth 2 (two) times daily before a meal.    . lidocaine (XYLOCAINE) 5 % ointment as directed.     Marland Kitchen lisinopril (PRINIVIL,ZESTRIL) 10 MG tablet Take 20 mg by mouth daily.     . metoprolol tartrate (LOPRESSOR) 25 MG tablet Take 25 mg by mouth daily.    . tamsulosin (FLOMAX) 0.4 MG CAPS capsule Take 0.4 mg by mouth daily.    Marland Kitchen oxyCODONE-acetaminophen (PERCOCET) 7.5-325 MG tablet Take 1 tablet by mouth 2 (two) times daily for 15 days. 30 tablet 0  . predniSONE (DELTASONE) 10 MG tablet Take 1 tablet (10 mg total) by mouth daily. (Patient not taking: Reported on 10/12/2017) 42 tablet 0   No facility-administered medications prior to visit.     Review of Systems CNS: No confusion or sedation Cardiac: No angina or palpitations GI: No abdominal pain or constipation Constitutional: No nausea vomiting fevers or chills  Objective:  BP 118/68   Pulse 79   Temp 98.5 F (36.9 C)   Resp 18   Ht 5\' 8"  (1.727 m)   Wt 174 lb (78.9 kg)   SpO2 100%   BMI 26.46 kg/m    BP Readings from Last 3 Encounters:  12/15/17 118/68  12/01/17 126/84  10/12/17 (!) 153/65     Wt Readings from Last 3 Encounters:  12/15/17 174 lb (78.9 kg)  12/01/17 174 lb (78.9 kg)  10/12/17 175 lb (79.4 kg)     Physical Exam Pt is alert and oriented PERRL EOMI HEART IS RRR no murmur or rub LCTA no wheezing or rales MUSCULOSKELETAL reveals some paraspinous muscle tenderness but no overt trigger points.  He has an antalgic gait.  His muscle tone and bulk appears at baseline  Labs  No results found for: HGBA1C Lab Results  Component Value Date   CREATININE 1.20 06/10/2016    -------------------------------------------------------------------------------------------------------------------- Lab Results  Component Value Date   CREATININE 1.20 06/10/2016     --------------------------------------------------------------------------------------------------------------------- Dg Eye Foreign Body  Result Date: 11/08/2017 CLINICAL DATA:  Metal working/exposure; clearance prior to MRI EXAM: ORBITS FOR FOREIGN BODY - 2 VIEW COMPARISON:  None. FINDINGS: There is no evidence of metallic foreign body within the orbits. No significant bone abnormality identified. IMPRESSION: No evidence of metallic foreign body within the orbits. Electronically Signed   By: Titus Dubin M.D.   On: 11/08/2017 11:19   Mr Lumbar Spine Wo Contrast  Result Date: 11/08/2017 CLINICAL DATA:  Low back pain, bilateral leg weakness and difficulty walking since a fall moving sand bags 09/21/2017. Initial encounter. EXAM: MRI LUMBAR SPINE WITHOUT CONTRAST TECHNIQUE: Multiplanar, multisequence MR imaging of the lumbar spine was performed. No intravenous contrast was administered. COMPARISON:  None. FINDINGS: Segmentation:  Standard. Alignment:  Maintained. Vertebrae: The patient has a compression fracture of L1 with vertebra plana deformity present in the anterior 2/3 of the vertebral body. There is marrow edema within the body. The patient's fracture does not involve the posterior elements. Minimal marrow edema in the posterior aspect of the L2 vertebral body eccentric to the right could be due to degenerative endplate signal change or microfracturing. Bone marrow signal is otherwise unremarkable. Conus medullaris and cauda equina: Conus extends to the L2 level. Conus and cauda equina appear normal. Paraspinal and other soft tissues: The descending abdominal aorta measures up to 3 cm in diameter. Disc levels: T9-10 and T10-11 are imaged in the sagittal plane only and negative. T11-12: Negative. T12-L1: Bony retropulsion off the superior endplate of L1 narrows but does not efface the ventral thecal sac. The central canal and foramina appear open. L1-2: Negative. L2-3: Very shallow left  paracentral protrusion. The central canal and foramina are open. L3-4: There is a shallow disc bulge and mild facet degenerative disease without stenosis. L4-5: Loss of disc space height is identified. Shallow disc bulge and endplate spur are eccentric to the left. The central spinal canal and neural foramina remain open. L5-S1: Very shallow central disc protrusion without stenosis. IMPRESSION: The examination is positive for an acute or subacute compression fracture of L1 with vertebra plana deformity in the anterior 2/3 of the vertebral body. There is some bony retropulsion off the posterior aspect of L1 which narrows the ventral thecal sac but does not compress the distal cord or cause stenosis. The fracture has features most consistent with a posttraumatic or senile osteoporotic injury. Mild degenerative endplate signal change versus a small focus of micro fracturing in the posterior aspect of the L2 vertebral body. Mild degenerative disc disease without stenosis. 3 cm abdominal aortic aneurysm. Recommend followup by ultrasound in 3 years. This recommendation follows ACR consensus guidelines: White Paper of the ACR Incidental Findings Committee II on Vascular Findings. Joellyn Rued Radiol 2013; [id number] Electronically Signed   By: Marcello Moores  Dalessio M.D.   On: 11/08/2017 12:48     Assessment & Plan:   [name] was seen today for back pain.  Diagnoses and all orders for this visit:  Acute bilateral low back pain with right-sided sciatica  Chronic pain syndrome  Chronic, continuous use of opioids  Facet arthritis of lumbar region  DDD (degenerative disc disease), lumbar  Vertebral compression fracture (HCC)  Cervicalgia  Other orders -     oxyCODONE-acetaminophen (PERCOCET) 7.5-325 MG tablet; Take 1 tablet by mouth every 6 (six) hours as needed for severe pain. -     butalbital-acetaminophen-caffeine (FIORICET, ESGIC) 50-325-40 MG tablet; Take 1-2 tablets by mouth every 6 (six) hours as  needed for up to 10 days for headache. -     oxyCODONE (ROXICODONE) 5 MG immediate release tablet; Take 1 tablet (5 mg total) by mouth every 4 (four) hours as needed for moderate pain or severe pain.        ----------------------------------------------------------------------------------------------------------------------  Problem List Items Addressed This Visit      Unprioritized   DDD (degenerative disc disease), lumbar   Relevant Medications   oxyCODONE-acetaminophen (PERCOCET) 7.5-325 MG tablet   butalbital-acetaminophen-caffeine (FIORICET, ESGIC) 50-325-40 MG tablet   oxyCODONE (ROXICODONE) 5 MG immediate release tablet   Facet arthritis of lumbar region   Relevant Medications   oxyCODONE-acetaminophen (PERCOCET) 7.5-325 MG tablet   butalbital-acetaminophen-caffeine (FIORICET, ESGIC) 50-325-40 MG tablet   oxyCODONE (ROXICODONE) 5 MG immediate release tablet    Other Visit Diagnoses    Acute bilateral low back pain with right-sided sciatica    -  Primary   Relevant Medications   oxyCODONE-acetaminophen (PERCOCET) 7.5-325 MG tablet   butalbital-acetaminophen-caffeine (FIORICET, ESGIC) 50-325-40 MG tablet   oxyCODONE (ROXICODONE) 5 MG immediate release tablet   Chronic pain syndrome       Chronic, continuous use of opioids       Vertebral compression fracture (HCC)       Cervicalgia            ----------------------------------------------------------------------------------------------------------------------  1. Acute bilateral low back pain with right-sided sciatica He was initially scheduled for an epidural steroid injection today however he has been taking the Fiorinal up to 6 and 8 times a day.  I have had this conversation with him before in regards to the associated aspirin with this medication.  I am worried about the effect of the aspirin and platelet aggregation with the epidural steroid injection.  I also cautioned him against this as to the risk for bleeding  gastrointestinal nature as well as kidney damage.  I requested that he discontinue the Fiorinal and we are going to switch him over to Fioricet for the next 10 days and he continue his baby aspirin once a day.  We will then plan to do the epidural in approximately 10 days.  We have reviewed the risks and benefits of the epidural steroid injection and the risk for bleeding and paralysis should he have an epidural hematoma.  Understands this risk and is going to discontinue the Fiorinal  2. Chronic pain syndrome As above.  We will plan on an epidural at his next visit and will fill his medications today.  This will be for Duragesic for August 21 and September 20 and we are going to move over to oxycodone 5 mg tablets 2 tablets 3 times daily to help with pain management over the next several days.  Have acetaminophen in it.  3. Chronic, continuous use of opioids We have reviewed the [place]  practitioner database information and it is appropriate.  4. Facet arthritis of lumbar region   5. DDD (degenerative disc disease), lumbar   6. Vertebral compression fracture ([place]) I have talked about seeing Dr. Rudene Christians for possible relation for kyphoplasty  7. Cervicalgia     ----------------------------------------------------------------------------------------------------------------------  I have changed [name]'s oxyCODONE-acetaminophen. I am also having him start on butalbital-acetaminophen-caffeine and oxyCODONE. Additionally, I am having him maintain his docusate sodium, tamsulosin, gemfibrozil, lisinopril, metoprolol tartrate, butalbital-aspirin-caffeine, atorvastatin, busPIRone, citalopram, diazepam, aspirin, lidocaine, predniSONE, and fentaNYL.   Meds ordered this encounter  Medications  . oxyCODONE-acetaminophen (PERCOCET) 7.5-325 MG tablet    Sig: Take 1 tablet by mouth every 6 (six) hours as needed for severe pain.    Dispense:  120 tablet    Refill:  0  .  butalbital-acetaminophen-caffeine (FIORICET, ESGIC) 50-325-40 MG tablet    Sig: Take 1-2 tablets by mouth every 6 (six) hours as needed for up to 10 days for headache.    Dispense:  60 tablet    Refill:  0  . oxyCODONE (ROXICODONE) 5 MG immediate release tablet    Sig: Take 1 tablet (5 mg total) by mouth every 4 (four) hours as needed for moderate pain or severe pain.    Dispense:  180 tablet    Refill:  0   Patient's Medications  New Prescriptions   BUTALBITAL-ACETAMINOPHEN-CAFFEINE (FIORICET, ESGIC) 50-325-40 MG TABLET    Take 1-2 tablets by mouth every 6 (six) hours as needed for up to 10 days for headache.   OXYCODONE (ROXICODONE) 5 MG IMMEDIATE RELEASE TABLET    Take 1 tablet (5 mg total) by mouth every 4 (four) hours as needed for moderate pain or severe pain.  Previous Medications   ASPIRIN 325 MG EC TABLET    Take 325 mg by mouth daily.    ATORVASTATIN (LIPITOR) 10 MG TABLET    Take 20 mg by mouth daily.    BUSPIRONE (BUSPAR) 15 MG TABLET    Take 15 mg by mouth 3 (three) times daily.    BUTALBITAL-ASPIRIN-CAFFEINE (FIORINAL) 50-325-40 MG CAPSULE    Take 1 capsule by mouth every 6 (six) hours as needed for headache.    CITALOPRAM (CELEXA) 20 MG TABLET    Take 20 mg by mouth daily.   DIAZEPAM (VALIUM) 5 MG TABLET    Take 5 mg by mouth every 6 (six) hours as needed.    DOCUSATE SODIUM (COLACE) 100 MG CAPSULE    Take 100 mg by mouth 2 (two) times daily.   FENTANYL (DURAGESIC - DOSED MCG/HR) 100 MCG/HR    Place 1 patch (100 mcg total) onto the skin every 3 (three) days.   GEMFIBROZIL (LOPID) 600 MG TABLET    Take 600 mg by mouth 2 (two) times daily before a meal.   LIDOCAINE (XYLOCAINE) 5 % OINTMENT    as directed.    LISINOPRIL (PRINIVIL,ZESTRIL) 10 MG TABLET    Take 20 mg by mouth daily.    METOPROLOL TARTRATE (LOPRESSOR) 25 MG TABLET    Take 25 mg by mouth daily.   PREDNISONE (DELTASONE) 10 MG TABLET    Take 1 tablet (10 mg total) by mouth daily.   TAMSULOSIN (FLOMAX) 0.4 MG CAPS  CAPSULE    Take 0.4 mg by mouth daily.  Modified Medications   Modified Medication Previous Medication   OXYCODONE-ACETAMINOPHEN (PERCOCET) 7.5-325 MG TABLET oxyCODONE-acetaminophen (PERCOCET) 7.5-325 MG tablet      Take 1 tablet by mouth every 6 (six) hours  as needed for severe pain.    Take 1 tablet by mouth 2 (two) times daily for 15 days.  Discontinued Medications   No medications on file   ----------------------------------------------------------------------------------------------------------------------  Follow-up: Return in about 1 week (around 12/22/2017) for evaluation, procedure.    Molli Barrows, MD

## 2017-12-22 ENCOUNTER — Encounter: Payer: Self-pay | Admitting: Anesthesiology

## 2017-12-22 ENCOUNTER — Other Ambulatory Visit: Payer: Self-pay | Admitting: Anesthesiology

## 2017-12-22 ENCOUNTER — Other Ambulatory Visit: Payer: Self-pay

## 2017-12-22 ENCOUNTER — Ambulatory Visit (HOSPITAL_BASED_OUTPATIENT_CLINIC_OR_DEPARTMENT_OTHER): Payer: Medicare Other | Admitting: Anesthesiology

## 2017-12-22 ENCOUNTER — Ambulatory Visit
Admission: RE | Admit: 2017-12-22 | Discharge: 2017-12-22 | Disposition: A | Discharge status: Home / Self Care | Payer: Medicare Other | Source: Ambulatory Visit | Attending: Anesthesiology | Admitting: Anesthesiology

## 2017-12-22 VITALS — BP 150/61 | HR 73 | Temp 98.3°F | Resp 18 | Ht 69.0 in | Wt 174.0 lb

## 2017-12-22 DIAGNOSIS — M51369 Other intervertebral disc degeneration, lumbar region without mention of lumbar back pain or lower extremity pain: Secondary | ICD-10-CM

## 2017-12-22 DIAGNOSIS — M5136 Other intervertebral disc degeneration, lumbar region: Secondary | ICD-10-CM | POA: Insufficient documentation

## 2017-12-22 DIAGNOSIS — M4856XA Collapsed vertebra, not elsewhere classified, lumbar region, initial encounter for fracture: Secondary | ICD-10-CM | POA: Insufficient documentation

## 2017-12-22 DIAGNOSIS — Z7982 Long term (current) use of aspirin: Secondary | ICD-10-CM | POA: Insufficient documentation

## 2017-12-22 DIAGNOSIS — F1190 Opioid use, unspecified, uncomplicated: Secondary | ICD-10-CM

## 2017-12-22 DIAGNOSIS — M549 Dorsalgia, unspecified: Secondary | ICD-10-CM | POA: Diagnosis present

## 2017-12-22 DIAGNOSIS — G894 Chronic pain syndrome: Secondary | ICD-10-CM

## 2017-12-22 DIAGNOSIS — Z79891 Long term (current) use of opiate analgesic: Secondary | ICD-10-CM | POA: Diagnosis not present

## 2017-12-22 DIAGNOSIS — M5441 Lumbago with sciatica, right side: Secondary | ICD-10-CM

## 2017-12-22 DIAGNOSIS — Z79899 Other long term (current) drug therapy: Secondary | ICD-10-CM | POA: Diagnosis not present

## 2017-12-22 DIAGNOSIS — M47816 Spondylosis without myelopathy or radiculopathy, lumbar region: Secondary | ICD-10-CM

## 2017-12-22 DIAGNOSIS — M4716 Other spondylosis with myelopathy, lumbar region: Secondary | ICD-10-CM | POA: Diagnosis not present

## 2017-12-22 DIAGNOSIS — R52 Pain, unspecified: Secondary | ICD-10-CM

## 2017-12-22 DIAGNOSIS — M4850XA Collapsed vertebra, not elsewhere classified, site unspecified, initial encounter for fracture: Secondary | ICD-10-CM

## 2017-12-22 MED ORDER — LIDOCAINE HCL (PF) 1 % IJ SOLN
5.0000 mL | Freq: Once | INTRAMUSCULAR | Status: DC
  Filled 2017-12-22: qty 5

## 2017-12-22 MED ORDER — SODIUM CHLORIDE 0.9% FLUSH
10.0000 mL | Freq: Once | INTRAVENOUS | Status: AC
  Administered 2017-12-22: 10 mL

## 2017-12-22 MED ORDER — IOPAMIDOL (ISOVUE-M 200) INJECTION 41%
20.0000 mL | Freq: Once | INTRAMUSCULAR | Status: DC | PRN
  Administered 2017-12-22: 10 mL
  Filled 2017-12-22: qty 20

## 2017-12-22 MED ORDER — OXYCODONE HCL 5 MG PO TABS: 5.0000 mg | ORAL_TABLET | Freq: Four times a day (QID) | ORAL | 0 refills | Status: AC | PRN

## 2017-12-22 MED ORDER — ROPIVACAINE HCL 2 MG/ML IJ SOLN
10.0000 mL | Freq: Once | INTRAMUSCULAR | Status: AC
  Administered 2017-12-22: 10 mL via EPIDURAL
  Filled 2017-12-22: qty 10

## 2017-12-22 MED ORDER — BUTALBITAL-APAP-CAFFEINE 50-325-40 MG PO TABS: 1.0000 | ORAL_TABLET | Freq: Four times a day (QID) | ORAL | 0 refills | Status: AC | PRN

## 2017-12-22 MED ORDER — OXYCODONE HCL 5 MG PO TABS: 5.0000 mg | ORAL_TABLET | Freq: Four times a day (QID) | ORAL | 0 refills | Status: DC | PRN

## 2017-12-22 MED ORDER — TRIAMCINOLONE ACETONIDE 40 MG/ML IJ SUSP
40.0000 mg | Freq: Once | INTRAMUSCULAR | Status: AC
  Administered 2017-12-22: 40 mg
  Filled 2017-12-22: qty 1

## 2017-12-22 NOTE — Progress Notes (Signed)
Nursing Pain Medication Assessment:  Safety precautions to be maintained throughout the outpatient stay will include: orient to surroundings, keep bed in low position, maintain call bell within reach at all times, provide assistance with transfer out of bed and ambulation.  Medication Inspection Compliance: Pill count conducted under aseptic conditions, in front of the patient. Neither the pills nor the bottle was removed from the patient's sight at any time. Once count was completed pills were immediately returned to the patient in their original bottle.  Medication: Oxycodone IR Pill/Patch Count: 130 of 180 pills remain Pill/Patch Appearance: Markings consistent with prescribed medication Bottle Appearance: Standard pharmacy container. Clearly labeled. Filled Date: 8 / 22 / 2019 Last Medication intake:  Today

## 2017-12-22 NOTE — Progress Notes (Signed)
Subjective:  Patient ID: [name], male    DOB: [birth year]  Age: 72 y.o. MRN: [id number]  CC: Back Pain   Procedure: L1-L2 epidural steroid under fluoroscopic guidance with no sedation  HPI [name] presents for reevaluation.  He was last seen about a week ago.  He stopped his fiorinal 9 days ago and has remained on baby aspirin only.  The quality characteristic distribution of his low back pain have remained unchanged since our last visit.  Resents today for his epidural injection.  He is tolerating his change over to oxycodone and Fioricet.  He is using these as prescribed.  Denies any diverting or illicit use.  Outpatient Medications Prior to Visit  Medication Sig Dispense Refill  . aspirin 325 MG EC tablet Take 325 mg by mouth daily.     Marland Kitchen atorvastatin (LIPITOR) 10 MG tablet Take 20 mg by mouth daily.     . busPIRone (BUSPAR) 15 MG tablet Take 15 mg by mouth 3 (three) times daily.     . citalopram (CELEXA) 20 MG tablet Take 20 mg by mouth daily.  0  . diazepam (VALIUM) 5 MG tablet Take 5 mg by mouth every 6 (six) hours as needed.     . docusate sodium (COLACE) 100 MG capsule Take 100 mg by mouth 2 (two) times daily.    . fentaNYL (DURAGESIC - DOSED MCG/HR) 100 MCG/HR Place 1 patch (100 mcg total) onto the skin every 3 (three) days. 10 patch 0  . gemfibrozil (LOPID) 600 MG tablet Take 600 mg by mouth 2 (two) times daily before a meal.    . lidocaine (XYLOCAINE) 5 % ointment as directed.     Marland Kitchen lisinopril (PRINIVIL,ZESTRIL) 10 MG tablet Take 20 mg by mouth daily.     . metoprolol tartrate (LOPRESSOR) 25 MG tablet Take 25 mg by mouth daily.    Marland Kitchen oxyCODONE-acetaminophen (PERCOCET) 7.5-325 MG tablet Take 1 tablet by mouth every 6 (six) hours as needed for severe pain. 120 tablet 0  . predniSONE (DELTASONE) 10 MG tablet Take 1 tablet (10 mg total) by mouth daily. 42 tablet 0  . tamsulosin (FLOMAX) 0.4 MG CAPS capsule Take 0.4 mg by mouth daily.    .  butalbital-acetaminophen-caffeine (FIORICET, ESGIC) 50-325-40 MG tablet Take 1-2 tablets by mouth every 6 (six) hours as needed for up to 10 days for headache. 60 tablet 0  . oxyCODONE (ROXICODONE) 5 MG immediate release tablet Take 1 tablet (5 mg total) by mouth every 4 (four) hours as needed for moderate pain or severe pain. 180 tablet 0  . butalbital-aspirin-caffeine (FIORINAL) 50-325-40 MG capsule Take 1 capsule by mouth every 6 (six) hours as needed for headache.      No facility-administered medications prior to visit.     Review of Systems CNS: No confusion or sedation Cardiac: No angina or palpitations GI: No abdominal pain or constipation Incisional: No nausea vomiting fevers or chills  Objective:  BP (!) 150/61   Pulse 73   Temp 98.3 F (36.8 C)   Resp 18   Ht 5\' 9"  (1.753 m)   Wt 174 lb (78.9 kg)   SpO2 99%   BMI 25.70 kg/m    BP Readings from Last 3 Encounters:  12/22/17 (!) 150/61  12/15/17 118/68  12/01/17 126/84     Wt Readings from Last 3 Encounters:  12/22/17 174 lb (78.9 kg)  12/15/17 174 lb (78.9 kg)  12/01/17 174 lb (78.9 kg)  Physical Exam Pt is alert and oriented PERRL EOMI HEART IS RRR no murmur or rub LCTA no wheezing or rales MUSCULOSKELETAL some paraspinous muscle tenderness but no overt trigger points.  His strength remains at baseline.  He still having some tenderness in the left and right greater than left lumbar paraspinous musculature.  Labs  No results found for: HGBA1C Lab Results  Component Value Date   CREATININE 1.20 06/10/2016    -------------------------------------------------------------------------------------------------------------------- Lab Results  Component Value Date   CREATININE 1.20 06/10/2016    --------------------------------------------------------------------------------------------------------------------- Dg C-arm 1-60 Min-no Report  Result Date: 12/22/2017 Fluoroscopy was utilized by the  requesting physician.  No radiographic interpretation.     Assessment & Plan:   [name] was seen today for back pain.  Diagnoses and all orders for this visit:  Acute bilateral low back pain with right-sided sciatica  Chronic pain syndrome  Chronic, continuous use of opioids  Facet arthritis of lumbar region  DDD (degenerative disc disease), lumbar -     Lumbar Epidural Injection; Future -     Lumbar Epidural Injection  Vertebral compression fracture (HCC) -     Lumbar Epidural Injection; Future -     Lumbar Epidural Injection  Spondylosis, lumbar, with myelopathy -     Lumbar Epidural Injection; Future -     Lumbar Epidural Injection  Other orders -     Discontinue: oxyCODONE (ROXICODONE) 5 MG immediate release tablet; Take 1 tablet (5 mg total) by mouth every 6 (six) hours as needed for moderate pain or severe pain. -     butalbital-acetaminophen-caffeine (FIORICET, ESGIC) 50-325-40 MG tablet; Take 1-2 tablets by mouth every 6 (six) hours as needed for headache. -     triamcinolone acetonide (KENALOG-40) injection 40 mg -     sodium chloride flush (NS) 0.9 % injection 10 mL -     ropivacaine (PF) 2 mg/mL (0.2%) (NAROPIN) injection 10 mL -     lidocaine (PF) (XYLOCAINE) 1 % injection 5 mL -     iopamidol (ISOVUE-M) 41 % intrathecal injection 20 mL -     oxyCODONE (ROXICODONE) 5 MG immediate release tablet; Take 1 tablet (5 mg total) by mouth every 6 (six) hours as needed for moderate pain or severe pain.        ----------------------------------------------------------------------------------------------------------------------  Problem List Items Addressed This Visit      Unprioritized   DDD (degenerative disc disease), lumbar   Relevant Medications   butalbital-acetaminophen-caffeine (FIORICET, ESGIC) 50-325-40 MG tablet   triamcinolone acetonide (KENALOG-40) injection 40 mg (Completed)   oxyCODONE (ROXICODONE) 5 MG immediate release tablet (Start on 01/14/2018)    Other Relevant Orders   Lumbar Epidural Injection   Facet arthritis of lumbar region   Relevant Medications   butalbital-acetaminophen-caffeine (FIORICET, ESGIC) 50-325-40 MG tablet   triamcinolone acetonide (KENALOG-40) injection 40 mg (Completed)   oxyCODONE (ROXICODONE) 5 MG immediate release tablet (Start on 01/14/2018)    Other Visit Diagnoses    Acute bilateral low back pain with right-sided sciatica    -  Primary   Relevant Medications   butalbital-acetaminophen-caffeine (FIORICET, ESGIC) 50-325-40 MG tablet   triamcinolone acetonide (KENALOG-40) injection 40 mg (Completed)   oxyCODONE (ROXICODONE) 5 MG immediate release tablet (Start on 01/14/2018)   Chronic pain syndrome       Chronic, continuous use of opioids       Vertebral compression fracture (HCC)       Relevant Orders   Lumbar Epidural Injection   Spondylosis, lumbar, with myelopathy  Relevant Orders   Lumbar Epidural Injection        ----------------------------------------------------------------------------------------------------------------------  1. Acute bilateral low back pain with right-sided sciatica We will proceed with an L1-L2 lumbar epidural steroid today.  I gone over the risks and benefits of this with him in full detail and all of his questions have been answered.  We will have him return to clinic in 1 month for possible repeat epidural injection to see if we can help with pain he is receiving from his vertebral compression fracture.  He is also to continue on his Fioricet easing this approximately 1 tablet 4 times daily for his chronic headaches.  Also he is to use his oxycodone approximately 4 tablets/day.  Has been refilled today.  2. Chronic pain syndrome Continue with baseline Duragesic patches for his baseline chronic diffuse body pain and osteoarthritis  3. Chronic, continuous use of opioids Have reviewed the [facility] practitioner database information and it is  appropriate.  4. Facet arthritis of lumbar region As above  5. DDD (degenerative disc disease), lumbar As above - Lumbar Epidural Injection; Future - Lumbar Epidural Injection  6. Vertebral compression fracture ([place]) Above. - Lumbar Epidural Injection; Future - Lumbar Epidural Injection  7. Spondylosis, lumbar, with myelopathy As above - Lumbar Epidural Injection; Future - Lumbar Epidural Injection    ----------------------------------------------------------------------------------------------------------------------  I have changed Doren Custard B. Kain's butalbital-acetaminophen-caffeine. I am also having him maintain his docusate sodium, tamsulosin, gemfibrozil, lisinopril, metoprolol tartrate, butalbital-aspirin-caffeine, atorvastatin, busPIRone, citalopram, diazepam, aspirin, lidocaine, predniSONE, fentaNYL, oxyCODONE-acetaminophen, and oxyCODONE. We administered triamcinolone acetonide, sodium chloride flush, ropivacaine (PF) 2 mg/mL (0.2%), and iopamidol.   Meds ordered this encounter  Medications  . DISCONTD: oxyCODONE (ROXICODONE) 5 MG immediate release tablet    Sig: Take 1 tablet (5 mg total) by mouth every 6 (six) hours as needed for moderate pain or severe pain.    Dispense:  120 tablet    Refill:  0    Do not fill until [id number]  . butalbital-acetaminophen-caffeine (FIORICET, ESGIC) 50-325-40 MG tablet    Sig: Take 1-2 tablets by mouth every 6 (six) hours as needed for headache.    Dispense:  120 tablet    Refill:  0    Do not fill until [id number]  . triamcinolone acetonide (KENALOG-40) injection 40 mg  . sodium chloride flush (NS) 0.9 % injection 10 mL  . ropivacaine (PF) 2 mg/mL (0.2%) (NAROPIN) injection 10 mL  . lidocaine (PF) (XYLOCAINE) 1 % injection 5 mL  . iopamidol (ISOVUE-M) 41 % intrathecal injection 20 mL  . oxyCODONE (ROXICODONE) 5 MG immediate release tablet    Sig: Take 1 tablet (5 mg total) by mouth every 6 (six) hours as needed for moderate pain  or severe pain.    Dispense:  120 tablet    Refill:  0    Do not fill until [id number]   Patient's Medications  New Prescriptions   No medications on file  Previous Medications   ASPIRIN 325 MG EC TABLET    Take 325 mg by mouth daily.    ATORVASTATIN (LIPITOR) 10 MG TABLET    Take 20 mg by mouth daily.    BUSPIRONE (BUSPAR) 15 MG TABLET    Take 15 mg by mouth 3 (three) times daily.    BUTALBITAL-ASPIRIN-CAFFEINE (FIORINAL) 50-325-40 MG CAPSULE    Take 1 capsule by mouth every 6 (six) hours as needed for headache.    CITALOPRAM (CELEXA) 20 MG TABLET    Take 20 mg by  mouth daily.   DIAZEPAM (VALIUM) 5 MG TABLET    Take 5 mg by mouth every 6 (six) hours as needed.    DOCUSATE SODIUM (COLACE) 100 MG CAPSULE    Take 100 mg by mouth 2 (two) times daily.   FENTANYL (DURAGESIC - DOSED MCG/HR) 100 MCG/HR    Place 1 patch (100 mcg total) onto the skin every 3 (three) days.   GEMFIBROZIL (LOPID) 600 MG TABLET    Take 600 mg by mouth 2 (two) times daily before a meal.   LIDOCAINE (XYLOCAINE) 5 % OINTMENT    as directed.    LISINOPRIL (PRINIVIL,ZESTRIL) 10 MG TABLET    Take 20 mg by mouth daily.    METOPROLOL TARTRATE (LOPRESSOR) 25 MG TABLET    Take 25 mg by mouth daily.   OXYCODONE-ACETAMINOPHEN (PERCOCET) 7.5-325 MG TABLET    Take 1 tablet by mouth every 6 (six) hours as needed for severe pain.   PREDNISONE (DELTASONE) 10 MG TABLET    Take 1 tablet (10 mg total) by mouth daily.   TAMSULOSIN (FLOMAX) 0.4 MG CAPS CAPSULE    Take 0.4 mg by mouth daily.  Modified Medications   Modified Medication Previous Medication   BUTALBITAL-ACETAMINOPHEN-CAFFEINE (FIORICET, ESGIC) 50-325-40 MG TABLET butalbital-acetaminophen-caffeine (FIORICET, ESGIC) 50-325-40 MG tablet      Take 1-2 tablets by mouth every 6 (six) hours as needed for headache.    Take 1-2 tablets by mouth every 6 (six) hours as needed for up to 10 days for headache.   OXYCODONE (ROXICODONE) 5 MG IMMEDIATE RELEASE TABLET oxyCODONE (ROXICODONE) 5 MG  immediate release tablet      Take 1 tablet (5 mg total) by mouth every 6 (six) hours as needed for moderate pain or severe pain.    Take 1 tablet (5 mg total) by mouth every 4 (four) hours as needed for moderate pain or severe pain.  Discontinued Medications   No medications on file   ----------------------------------------------------------------------------------------------------------------------  Follow-up: Return for evaluation, procedure.   Procedure: L1-L2 LESI with fluoroscopic guidance and no moderate sedation  NOTE: The risks, benefits, and expectations of the procedure have been discussed and explained to the patient who was understanding and in agreement with suggested treatment plan. No guarantees were made.  DESCRIPTION OF PROCEDURE: Lumbar epidural steroid injection with no IV Versed, EKG, blood pressure, pulse, and pulse oximetry monitoring. The procedure was performed with the patient in the prone position under fluoroscopic guidance.  Sterile prep x3 was initiated and I then injected subcutaneous lidocaine to the overlying L1-L2 site after its fluoroscopic identifictation.  Using strict aseptic technique, I then advanced an 18-gauge Tuohy epidural needle in the midline using interlaminar approach via loss-of-resistance to saline technique. There was negative aspiration for heme or  CSF.  I then confirmed position with both AP and Lateral fluoroscan.  2 cc of Isovue were injected and a  total of 5 mL of Preservative-Free normal saline mixed with 40 mg of Kenalog and 1cc Ropicaine 0.2 percent were injected incrementally via the  epidurally placed needle. The needle was removed. The patient tolerated the injection well and was convalesced and discharged to home in stable condition. Should the patient have any post procedure difficulty they have been instructed on how to contact us for assistance.    Molli Barrows, MD

## 2017-12-23 ENCOUNTER — Telehealth: Payer: Self-pay

## 2017-12-23 NOTE — Telephone Encounter (Signed)
Post procedure phone call.  Patients wife states he is doing good.

## 2018-02-10 ENCOUNTER — Ambulatory Visit: Payer: Medicare Other | Admitting: Anesthesiology

## 2018-02-13 ENCOUNTER — Other Ambulatory Visit: Payer: Self-pay

## 2018-02-13 ENCOUNTER — Ambulatory Visit: Payer: Medicare Other | Attending: Anesthesiology | Admitting: Anesthesiology

## 2018-02-13 ENCOUNTER — Encounter: Payer: Self-pay | Admitting: Anesthesiology

## 2018-02-13 VITALS — BP 129/64 | HR 65 | Temp 98.5°F | Resp 18 | Ht 68.0 in | Wt 172.0 lb

## 2018-02-13 DIAGNOSIS — M5441 Lumbago with sciatica, right side: Secondary | ICD-10-CM | POA: Insufficient documentation

## 2018-02-13 DIAGNOSIS — M545 Low back pain: Secondary | ICD-10-CM | POA: Diagnosis present

## 2018-02-13 DIAGNOSIS — M4856XG Collapsed vertebra, not elsewhere classified, lumbar region, subsequent encounter for fracture with delayed healing: Secondary | ICD-10-CM | POA: Insufficient documentation

## 2018-02-13 DIAGNOSIS — F1190 Opioid use, unspecified, uncomplicated: Secondary | ICD-10-CM | POA: Diagnosis not present

## 2018-02-13 DIAGNOSIS — G894 Chronic pain syndrome: Secondary | ICD-10-CM | POA: Insufficient documentation

## 2018-02-13 DIAGNOSIS — M5106 Intervertebral disc disorders with myelopathy, lumbar region: Secondary | ICD-10-CM | POA: Diagnosis not present

## 2018-02-13 DIAGNOSIS — M160 Bilateral primary osteoarthritis of hip: Secondary | ICD-10-CM | POA: Diagnosis not present

## 2018-02-13 DIAGNOSIS — M5136 Other intervertebral disc degeneration, lumbar region: Secondary | ICD-10-CM

## 2018-02-13 DIAGNOSIS — M1612 Unilateral primary osteoarthritis, left hip: Secondary | ICD-10-CM

## 2018-02-13 DIAGNOSIS — Z79891 Long term (current) use of opiate analgesic: Secondary | ICD-10-CM | POA: Insufficient documentation

## 2018-02-13 DIAGNOSIS — Z79899 Other long term (current) drug therapy: Secondary | ICD-10-CM | POA: Diagnosis not present

## 2018-02-13 DIAGNOSIS — Z7982 Long term (current) use of aspirin: Secondary | ICD-10-CM | POA: Insufficient documentation

## 2018-02-13 DIAGNOSIS — M47816 Spondylosis without myelopathy or radiculopathy, lumbar region: Secondary | ICD-10-CM | POA: Diagnosis not present

## 2018-02-13 DIAGNOSIS — M1611 Unilateral primary osteoarthritis, right hip: Secondary | ICD-10-CM

## 2018-02-13 DIAGNOSIS — M4716 Other spondylosis with myelopathy, lumbar region: Secondary | ICD-10-CM | POA: Diagnosis not present

## 2018-02-13 DIAGNOSIS — S32010G Wedge compression fracture of first lumbar vertebra, subsequent encounter for fracture with delayed healing: Secondary | ICD-10-CM

## 2018-02-13 MED ORDER — FENTANYL 100 MCG/HR TD PT72: 100.0000 ug | MEDICATED_PATCH | TRANSDERMAL | 0 refills | Status: DC

## 2018-02-13 NOTE — Progress Notes (Signed)
Subjective:  Patient ID: [name], male    DOB: 10/11/1945  Age: 72 y.o. MRN: [id number]  CC: Follow-up   Procedure: None  HPI [name] presents for reevaluation.  He was last seen several weeks ago and had an epidural steroid for his exacerbation of high lumbar back pain following a vertebral compression fracture.  He reports that he is now able to ambulate without his walker but only for short to moderate distances.  He is still reliant on the walker for greater distance.  The quality of the back pain is stable in nature he is still getting breakthrough generally on day 3 of his Duragesic patch and this can be quite miserable.  He describes this as a severe debilitating type pain primarily the low back with radiation into both hips.  He also has chronic bilateral hip pain and lower leg pain and degenerative arthritis of the bilateral shoulders.  He uses his oxycodone for breakthrough pain on day 3 more so than day 1 and 2.  Otherwise he is in his usual state of health at this point.  Based on his narcotic assessment sheet he continues to derive good functional lifestyle improvement with the medications and no untoward side effects are mentioned.  Outpatient Medications Prior to Visit  Medication Sig Dispense Refill  . aspirin 81 MG chewable tablet Chew by mouth daily.    Marland Kitchen atorvastatin (LIPITOR) 10 MG tablet Take 20 mg by mouth daily.     . busPIRone (BUSPAR) 15 MG tablet Take 15 mg by mouth 3 (three) times daily.     . butalbital-aspirin-caffeine (FIORINAL) 50-325-40 MG capsule Take 1 capsule by mouth every 6 (six) hours as needed for headache.     . diazepam (VALIUM) 5 MG tablet Take 5 mg by mouth every 6 (six) hours as needed.     . docusate sodium (COLACE) 100 MG capsule Take 100 mg by mouth 2 (two) times daily.    . DULoxetine (CYMBALTA) 30 MG capsule Take 30 mg by mouth daily.    Marland Kitchen gemfibrozil (LOPID) 600 MG tablet Take 600 mg by mouth 2 (two) times daily before a  meal.    . lidocaine (XYLOCAINE) 5 % ointment as directed.     Marland Kitchen lisinopril (PRINIVIL,ZESTRIL) 10 MG tablet Take 20 mg by mouth daily.     . metoprolol tartrate (LOPRESSOR) 25 MG tablet Take 25 mg by mouth daily.    Marland Kitchen oxyCODONE (ROXICODONE) 5 MG immediate release tablet Take 1 tablet (5 mg total) by mouth every 6 (six) hours as needed for moderate pain or severe pain. 120 tablet 0  . predniSONE (DELTASONE) 10 MG tablet Take 1 tablet (10 mg total) by mouth daily. 42 tablet 0  . tamsulosin (FLOMAX) 0.4 MG CAPS capsule Take 0.4 mg by mouth daily.    . citalopram (CELEXA) 20 MG tablet Take 20 mg by mouth daily.  0  . fentaNYL (DURAGESIC - DOSED MCG/HR) 100 MCG/HR Place 1 patch (100 mcg total) onto the skin every 3 (three) days. 10 patch 0  . aspirin 325 MG EC tablet Take 325 mg by mouth daily.      No facility-administered medications prior to visit.     Review of Systems CNS: No confusion or sedation Cardiac: No angina or palpitations GI: No abdominal pain or constipation Constitutional: No nausea vomiting fevers or chills  Objective:  BP (!) 138/103   Pulse 69   Temp 98.5 F (36.9 C)   Resp  18   Ht 5\' 8"  (1.727 m)   Wt 172 lb (78 kg)   SpO2 100%   BMI 26.15 kg/m    BP Readings from Last 3 Encounters:  02/13/18 (!) 138/103  12/22/17 (!) 150/61  12/15/17 118/68     Wt Readings from Last 3 Encounters:  02/13/18 172 lb (78 kg)  12/22/17 174 lb (78.9 kg)  12/15/17 174 lb (78.9 kg)     Physical Exam Pt is alert and oriented PERRL EOMI HEART IS RRR no murmur or rub LCTA no wheezing or rales MUSCULOSKELETAL reveals some paraspinous muscle tenderness especially in the high lumbar region less so in the lower lumbar region.  His muscle tone and bulk is at baseline and he is ambulating with an antalgic gait but is not using his walker today.  Labs  No results found for: HGBA1C Lab Results  Component Value Date   CREATININE 1.20 06/10/2016     -------------------------------------------------------------------------------------------------------------------- Lab Results  Component Value Date   CREATININE 1.20 06/10/2016    --------------------------------------------------------------------------------------------------------------------- Dg C-arm 1-60 Min-no Report  Result Date: 12/22/2017 Fluoroscopy was utilized by the requesting physician.  No radiographic interpretation.     Assessment & Plan:   [name] was seen today for follow-up.  Diagnoses and all orders for this visit:  Acute bilateral low back pain with right-sided sciatica  Chronic pain syndrome  Chronic, continuous use of opioids  Facet arthritis of lumbar region  DDD (degenerative disc disease), lumbar  Compression fracture of L1 vertebra with delayed healing, subsequent encounter  Spondylosis, lumbar, with myelopathy  Primary osteoarthritis of left hip  Primary osteoarthritis of right hip  Other orders -     Discontinue: fentaNYL (DURAGESIC - DOSED MCG/HR) 100 MCG/HR; Place 1 patch (100 mcg total) onto the skin every other day. -     fentaNYL (DURAGESIC - DOSED MCG/HR) 100 MCG/HR; Place 1 patch (100 mcg total) onto the skin every other day.        ----------------------------------------------------------------------------------------------------------------------  Problem List Items Addressed This Visit      Unprioritized   DDD (degenerative disc disease), lumbar   Relevant Medications   aspirin 81 MG chewable tablet   fentaNYL (DURAGESIC - DOSED MCG/HR) 100 MCG/HR   Facet arthritis of lumbar region   Relevant Medications   aspirin 81 MG chewable tablet   fentaNYL (DURAGESIC - DOSED MCG/HR) 100 MCG/HR    Other Visit Diagnoses    Acute bilateral low back pain with right-sided sciatica    -  Primary   Relevant Medications   aspirin 81 MG chewable tablet   fentaNYL (DURAGESIC - DOSED MCG/HR) 100 MCG/HR   Chronic pain  syndrome       Chronic, continuous use of opioids       Compression fracture of L1 vertebra with delayed healing, subsequent encounter       Spondylosis, lumbar, with myelopathy       Primary osteoarthritis of left hip       Relevant Medications   aspirin 81 MG chewable tablet   fentaNYL (DURAGESIC - DOSED MCG/HR) 100 MCG/HR   Primary osteoarthritis of right hip       Relevant Medications   aspirin 81 MG chewable tablet   fentaNYL (DURAGESIC - DOSED MCG/HR) 100 MCG/HR        ----------------------------------------------------------------------------------------------------------------------  1. Acute bilateral low back pain with right-sided sciatica Based on the severity of his day 3 pain and the chronicity I am going to move him back to every other  day dosing of the Duragesic.  He maintains that he always breakthrough on day 3 and this is a terrible day for him.  He denies any side effects with the medication and this is worked well for him previously.  I am going to discontinue the short acting breakthrough opioids with this change.  He has been instructed to discontinue the oxycodone for breakthrough until his evaluation in 2 months.  We will give him prescriptions for the Duragesic patch to be changed every 48 hours dated 10/20 and November 19.  We have also talked about the risks of respiratory depression associated with this medication regimen and he has been instructed to report to the ER should he have any increased sedation or respiratory depression and he is aware of how to use the Narcan pen.  2. Chronic pain syndrome As above.  3. Chronic, continuous use of opioids We have reviewed the [facility] practitioner database information is appropriate.  4. Facet arthritis of lumbar region As above  5. DDD (degenerative disc disease), lumbar As above  6. Compression fracture of L1 vertebra with delayed healing, subsequent encounter Continue follow-up with his primary care  physicians.  7. Spondylosis, lumbar, with myelopathy   8. Primary osteoarthritis of left hip   9. Primary osteoarthritis of right hip     ----------------------------------------------------------------------------------------------------------------------  I have discontinued [name]'s citalopram. I am also having him maintain his docusate sodium, tamsulosin, gemfibrozil, lisinopril, metoprolol tartrate, butalbital-aspirin-caffeine, atorvastatin, busPIRone, diazepam, lidocaine, predniSONE, oxyCODONE, aspirin, DULoxetine, and fentaNYL.   Meds ordered this encounter  Medications  . DISCONTD: fentaNYL (DURAGESIC - DOSED MCG/HR) 100 MCG/HR    Sig: Place 1 patch (100 mcg total) onto the skin every other day.    Dispense:  15 patch    Refill:  0    Do not fill until [id number]  . fentaNYL (DURAGESIC - DOSED MCG/HR) 100 MCG/HR    Sig: Place 1 patch (100 mcg total) onto the skin every other day.    Dispense:  15 patch    Refill:  0    Do not fill until [id number]   Patient's Medications  New Prescriptions   No medications on file  Previous Medications   ASPIRIN 81 MG CHEWABLE TABLET    Chew by mouth daily.   ATORVASTATIN (LIPITOR) 10 MG TABLET    Take 20 mg by mouth daily.    BUSPIRONE (BUSPAR) 15 MG TABLET    Take 15 mg by mouth 3 (three) times daily.    BUTALBITAL-ASPIRIN-CAFFEINE (FIORINAL) 50-325-40 MG CAPSULE    Take 1 capsule by mouth every 6 (six) hours as needed for headache.    DIAZEPAM (VALIUM) 5 MG TABLET    Take 5 mg by mouth every 6 (six) hours as needed.    DOCUSATE SODIUM (COLACE) 100 MG CAPSULE    Take 100 mg by mouth 2 (two) times daily.   DULOXETINE (CYMBALTA) 30 MG CAPSULE    Take 30 mg by mouth daily.   GEMFIBROZIL (LOPID) 600 MG TABLET    Take 600 mg by mouth 2 (two) times daily before a meal.   LIDOCAINE (XYLOCAINE) 5 % OINTMENT    as directed.    LISINOPRIL (PRINIVIL,ZESTRIL) 10 MG TABLET    Take 20 mg by mouth daily.    METOPROLOL TARTRATE  (LOPRESSOR) 25 MG TABLET    Take 25 mg by mouth daily.   OXYCODONE (ROXICODONE) 5 MG IMMEDIATE RELEASE TABLET    Take 1 tablet (5 mg total) by  mouth every 6 (six) hours as needed for moderate pain or severe pain.   PREDNISONE (DELTASONE) 10 MG TABLET    Take 1 tablet (10 mg total) by mouth daily.   TAMSULOSIN (FLOMAX) 0.4 MG CAPS CAPSULE    Take 0.4 mg by mouth daily.  Modified Medications   Modified Medication Previous Medication   FENTANYL (DURAGESIC - DOSED MCG/HR) 100 MCG/HR fentaNYL (DURAGESIC - DOSED MCG/HR) 100 MCG/HR      Place 1 patch (100 mcg total) onto the skin every other day.    Place 1 patch (100 mcg total) onto the skin every 3 (three) days.  Discontinued Medications   ASPIRIN 325 MG EC TABLET    Take 325 mg by mouth daily.    CITALOPRAM (CELEXA) 20 MG TABLET    Take 20 mg by mouth daily.   ----------------------------------------------------------------------------------------------------------------------  Follow-up: No follow-ups on file.    Molli Barrows, MD

## 2018-02-13 NOTE — Progress Notes (Signed)
Nursing Pain Medication Assessment:  Safety precautions to be maintained throughout the outpatient stay will include: orient to surroundings, keep bed in low position, maintain call bell within reach at all times, provide assistance with transfer out of bed and ambulation.   Medication Inspection Compliance: Pill count conducted under aseptic conditions, in front of the patient. Neither the pills nor the bottle was removed from the patient's sight at any time. Once count was completed pills were immediately returned to the patient in their original bottle.  Medication #1: Oxycodone IR Pill/Patch Count: 30 of 120 pills remain Pill/Patch Appearance: Markings consistent with prescribed medication Bottle Appearance: Standard pharmacy container. Clearly labeled. Filled Date: 09 / 21 / 2019 Last Medication intake:  Today  Medication #2: Butalbital/actamin./caff.  Pill/Patch Count: 99 of 120 pills remain Pill/Patch Appearance: Markings consistent with prescribed medication Bottle Appearance: Standard pharmacy container. Clearly labeled. Filled Date: 09 / 04 / 2019    Medication 3: Fentanyl patch Pill/Patch Count: 0 of 10 pills remain Pill/Patch Appearance: Markings consistent with prescribed medication Bottle Appearance: Standard pharmacy container. Clearly labeled. Filled Date: 09 / 20 / 2019 Last Medication intake:  Day before yesterday  Safety precautions to be maintained throughout the outpatient stay will include: orient to surroundings, keep bed in low position, maintain call bell within reach at all times, provide assistance with transfer out of bed and ambulation.

## 2018-02-13 NOTE — Patient Instructions (Signed)
You have been given 2 prescriptions for Fentanyl patch 142mcg

## 2018-04-12 ENCOUNTER — Ambulatory Visit: Payer: Medicare Other | Attending: Anesthesiology | Admitting: Anesthesiology

## 2018-04-12 ENCOUNTER — Other Ambulatory Visit: Payer: Self-pay

## 2018-04-12 ENCOUNTER — Encounter: Payer: Self-pay | Admitting: Anesthesiology

## 2018-04-12 VITALS — BP 153/61 | HR 66 | Temp 98.5°F | Resp 18 | Ht 69.0 in | Wt 174.0 lb

## 2018-04-12 DIAGNOSIS — M47816 Spondylosis without myelopathy or radiculopathy, lumbar region: Secondary | ICD-10-CM | POA: Diagnosis not present

## 2018-04-12 DIAGNOSIS — S32010G Wedge compression fracture of first lumbar vertebra, subsequent encounter for fracture with delayed healing: Secondary | ICD-10-CM

## 2018-04-12 DIAGNOSIS — Z7982 Long term (current) use of aspirin: Secondary | ICD-10-CM | POA: Diagnosis not present

## 2018-04-12 DIAGNOSIS — F1190 Opioid use, unspecified, uncomplicated: Secondary | ICD-10-CM

## 2018-04-12 DIAGNOSIS — M4686 Other specified inflammatory spondylopathies, lumbar region: Secondary | ICD-10-CM | POA: Diagnosis not present

## 2018-04-12 DIAGNOSIS — G894 Chronic pain syndrome: Secondary | ICD-10-CM | POA: Insufficient documentation

## 2018-04-12 DIAGNOSIS — Z79899 Other long term (current) drug therapy: Secondary | ICD-10-CM | POA: Diagnosis not present

## 2018-04-12 DIAGNOSIS — M4716 Other spondylosis with myelopathy, lumbar region: Secondary | ICD-10-CM | POA: Diagnosis not present

## 2018-04-12 DIAGNOSIS — M5136 Other intervertebral disc degeneration, lumbar region: Secondary | ICD-10-CM | POA: Diagnosis not present

## 2018-04-12 DIAGNOSIS — M5441 Lumbago with sciatica, right side: Secondary | ICD-10-CM

## 2018-04-12 MED ORDER — OXYCODONE-ACETAMINOPHEN 7.5-325 MG PO TABS: 1.0000 | ORAL_TABLET | Freq: Three times a day (TID) | ORAL | 0 refills | Status: DC | PRN

## 2018-04-12 MED ORDER — FENTANYL 100 MCG/HR TD PT72: 100.0000 ug | MEDICATED_PATCH | TRANSDERMAL | 0 refills | Status: DC

## 2018-04-12 NOTE — Progress Notes (Signed)
Subjective:  Patient ID: [name], male    DOB: 20-Dec-1945  Age: 72 y.o. MRN: [id number]  CC: Follow-up   Procedure: None  HPI [name] presents for reevaluation.  He was last seen 2 months ago and has been doing relatively well with his existing regimen.  He uses occasional oxycodone for breakthrough pain he continues to use his Duragesic patches applied every other day.  This regimen keeps his chronic low back pain with recent exacerbation for vertebral compression fracture under control.  He denies any problems with the regimen no sedation and based on his narcotic assessment sheet he continues to derive good functional lifestyle improvement with no side effects noted.  The quality of his low back pain is stable in nature.  He occasionally gets gnawing aching breakthrough pain for which he takes his oxycodone averaging 1 or 2 a day.  Otherwise he is in his usual state of health.  Outpatient Medications Prior to Visit  Medication Sig Dispense Refill  . aspirin 81 MG chewable tablet Chew by mouth daily.    Marland Kitchen atorvastatin (LIPITOR) 10 MG tablet Take 20 mg by mouth daily.     . busPIRone (BUSPAR) 15 MG tablet Take 15 mg by mouth 3 (three) times daily.     . butalbital-aspirin-caffeine (FIORINAL) 50-325-40 MG capsule Take 1 capsule by mouth every 6 (six) hours as needed for headache.     . diazepam (VALIUM) 5 MG tablet Take 5 mg by mouth every 6 (six) hours as needed.     . docusate sodium (COLACE) 100 MG capsule Take 100 mg by mouth 2 (two) times daily.    Marland Kitchen gemfibrozil (LOPID) 600 MG tablet Take 600 mg by mouth 2 (two) times daily before a meal.    . lidocaine (XYLOCAINE) 5 % ointment as directed.     Marland Kitchen lisinopril (PRINIVIL,ZESTRIL) 10 MG tablet Take 20 mg by mouth daily.     . metoprolol tartrate (LOPRESSOR) 25 MG tablet Take 25 mg by mouth daily.    . tamsulosin (FLOMAX) 0.4 MG CAPS capsule Take 0.4 mg by mouth daily.    . fentaNYL (DURAGESIC - DOSED MCG/HR) 100 MCG/HR  Place 1 patch (100 mcg total) onto the skin every other day. 15 patch 0  . DULoxetine (CYMBALTA) 30 MG capsule Take 30 mg by mouth daily.    . predniSONE (DELTASONE) 10 MG tablet Take 1 tablet (10 mg total) by mouth daily. (Patient not taking: Reported on 04/12/2018) 42 tablet 0   No facility-administered medications prior to visit.     Review of Systems CNS: No confusion or sedation Cardiac: No angina or palpitations GI: No abdominal pain or constipation Constitutional: No nausea vomiting fevers or chills  Objective:  BP (!) 153/61   Pulse 66   Temp 98.5 F (36.9 C)   Resp 18   Ht 5\' 9"  (1.753 m)   Wt 174 lb (78.9 kg)   SpO2 98%   BMI 25.70 kg/m    BP Readings from Last 3 Encounters:  04/12/18 (!) 153/61  02/13/18 129/64  12/22/17 (!) 150/61     Wt Readings from Last 3 Encounters:  04/12/18 174 lb (78.9 kg)  02/13/18 172 lb (78 kg)  12/22/17 174 lb (78.9 kg)     Physical Exam Pt is alert and oriented PERRL EOMI HEART IS RRR no murmur or rub LCTA no wheezing or rales MUSCULOSKELETAL reveals an antalgic gait but muscle tone and bulk to the lower extremities appears  to be at baseline.  Labs  No results found for: HGBA1C Lab Results  Component Value Date   CREATININE 1.20 06/10/2016    -------------------------------------------------------------------------------------------------------------------- Lab Results  Component Value Date   CREATININE 1.20 06/10/2016    --------------------------------------------------------------------------------------------------------------------- Dg C-arm 1-60 Min-no Report  Result Date: 12/22/2017 Fluoroscopy was utilized by the requesting physician.  No radiographic interpretation.     Assessment & Plan:   [name] was seen today for follow-up.  Diagnoses and all orders for this visit:  Acute bilateral low back pain with right-sided sciatica  Chronic pain syndrome  Chronic, continuous use of  opioids  Facet arthritis of lumbar region  DDD (degenerative disc disease), lumbar  Compression fracture of L1 vertebra with delayed healing, subsequent encounter  Spondylosis, lumbar, with myelopathy  Other orders -     Discontinue: fentaNYL (DURAGESIC - DOSED MCG/HR) 100 MCG/HR; Place 1 patch (100 mcg total) onto the skin every other day. -     fentaNYL (DURAGESIC - DOSED MCG/HR) 100 MCG/HR; Place 1 patch (100 mcg total) onto the skin every other day. -     Discontinue: oxyCODONE-acetaminophen (PERCOCET) 7.5-325 MG tablet; Take 1 tablet by mouth every 8 (eight) hours as needed for moderate pain or severe pain. -     oxyCODONE-acetaminophen (PERCOCET) 7.5-325 MG tablet; Take 1 tablet by mouth every 8 (eight) hours as needed for moderate pain or severe pain.        ----------------------------------------------------------------------------------------------------------------------  Problem List Items Addressed This Visit      Unprioritized   DDD (degenerative disc disease), lumbar   Relevant Medications   fentaNYL (DURAGESIC - DOSED MCG/HR) 100 MCG/HR (Start on 05/13/2018)   oxyCODONE-acetaminophen (PERCOCET) 7.5-325 MG tablet (Start on 05/12/2018)   Facet arthritis of lumbar region   Relevant Medications   fentaNYL (DURAGESIC - DOSED MCG/HR) 100 MCG/HR (Start on 05/13/2018)   oxyCODONE-acetaminophen (PERCOCET) 7.5-325 MG tablet (Start on 05/12/2018)    Other Visit Diagnoses    Acute bilateral low back pain with right-sided sciatica    -  Primary   Relevant Medications   fentaNYL (DURAGESIC - DOSED MCG/HR) 100 MCG/HR (Start on 05/13/2018)   oxyCODONE-acetaminophen (PERCOCET) 7.5-325 MG tablet (Start on 05/12/2018)   Chronic pain syndrome       Chronic, continuous use of opioids       Compression fracture of L1 vertebra with delayed healing, subsequent encounter       Spondylosis, lumbar, with myelopathy             ----------------------------------------------------------------------------------------------------------------------  1. Acute bilateral low back pain with right-sided sciatica We will defer on any repeat injections today and continue with her current regimen.  We will refill his Duragesic for December 19 and January 18.  We will also refill his oxycodone for breakthrough pain for once a day dosing.  We have reviewed the [facility] practitioner database information and it is appropriate.  We will have him return to clinic in 2 months for reevaluation.  2. Chronic pain syndrome As above  3. Chronic, continuous use of opioids As above  4. Facet arthritis of lumbar region   5. DDD (degenerative disc disease), lumbar   6. Compression fracture of L1 vertebra with delayed healing, subsequent encounter I have discussed options with him regarding referral to Dr. Rudene Christians for vertebroplasty or kyphoplasty for consideration.  7. Spondylosis, lumbar, with myelopathy     ----------------------------------------------------------------------------------------------------------------------  I am having [name] maintain his docusate sodium, tamsulosin, gemfibrozil, lisinopril, metoprolol tartrate, butalbital-aspirin-caffeine, atorvastatin, busPIRone,  diazepam, lidocaine, predniSONE, aspirin, DULoxetine, fentaNYL, and oxyCODONE-acetaminophen.   Meds ordered this encounter  Medications  . DISCONTD: fentaNYL (DURAGESIC - DOSED MCG/HR) 100 MCG/HR    Sig: Place 1 patch (100 mcg total) onto the skin every other day.    Dispense:  15 patch    Refill:  0    Do not fill until [id number]  . fentaNYL (DURAGESIC - DOSED MCG/HR) 100 MCG/HR    Sig: Place 1 patch (100 mcg total) onto the skin every other day.    Dispense:  15 patch    Refill:  0    Do not fill until [id number]  . DISCONTD: oxyCODONE-acetaminophen (PERCOCET) 7.5-325 MG tablet    Sig: Take 1 tablet by mouth every 8  (eight) hours as needed for moderate pain or severe pain.    Dispense:  30 tablet    Refill:  0    30 day supply.  No early refill  . oxyCODONE-acetaminophen (PERCOCET) 7.5-325 MG tablet    Sig: Take 1 tablet by mouth every 8 (eight) hours as needed for moderate pain or severe pain.    Dispense:  30 tablet    Refill:  0    30 day supply.  No early refill   Patient's Medications  New Prescriptions   OXYCODONE-ACETAMINOPHEN (PERCOCET) 7.5-325 MG TABLET    Take 1 tablet by mouth every 8 (eight) hours as needed for moderate pain or severe pain.  Previous Medications   ASPIRIN 81 MG CHEWABLE TABLET    Chew by mouth daily.   ATORVASTATIN (LIPITOR) 10 MG TABLET    Take 20 mg by mouth daily.    BUSPIRONE (BUSPAR) 15 MG TABLET    Take 15 mg by mouth 3 (three) times daily.    BUTALBITAL-ASPIRIN-CAFFEINE (FIORINAL) 50-325-40 MG CAPSULE    Take 1 capsule by mouth every 6 (six) hours as needed for headache.    DIAZEPAM (VALIUM) 5 MG TABLET    Take 5 mg by mouth every 6 (six) hours as needed.    DOCUSATE SODIUM (COLACE) 100 MG CAPSULE    Take 100 mg by mouth 2 (two) times daily.   DULOXETINE (CYMBALTA) 30 MG CAPSULE    Take 30 mg by mouth daily.   GEMFIBROZIL (LOPID) 600 MG TABLET    Take 600 mg by mouth 2 (two) times daily before a meal.   LIDOCAINE (XYLOCAINE) 5 % OINTMENT    as directed.    LISINOPRIL (PRINIVIL,ZESTRIL) 10 MG TABLET    Take 20 mg by mouth daily.    METOPROLOL TARTRATE (LOPRESSOR) 25 MG TABLET    Take 25 mg by mouth daily.   PREDNISONE (DELTASONE) 10 MG TABLET    Take 1 tablet (10 mg total) by mouth daily.   TAMSULOSIN (FLOMAX) 0.4 MG CAPS CAPSULE    Take 0.4 mg by mouth daily.  Modified Medications   Modified Medication Previous Medication   FENTANYL (DURAGESIC - DOSED MCG/HR) 100 MCG/HR fentaNYL (DURAGESIC - DOSED MCG/HR) 100 MCG/HR      Place 1 patch (100 mcg total) onto the skin every other day.    Place 1 patch (100 mcg total) onto the skin every other day.  Discontinued  Medications   No medications on file   ----------------------------------------------------------------------------------------------------------------------  Follow-up: Return in about 2 months (around 06/13/2018) for evaluation, med refill.    Molli Barrows, MD

## 2018-04-12 NOTE — Progress Notes (Signed)
Nursing Pain Medication Assessment:  Safety precautions to be maintained throughout the outpatient stay will include: orient to surroundings, keep bed in low position, maintain call bell within reach at all times, provide assistance with transfer out of bed and ambulation.  Medication Inspection Compliance: Pill count conducted under aseptic conditions, in front of the patient. Neither the pills nor the bottle was removed from the patient's sight at any time. Once count was completed pills were immediately returned to the patient in their original bottle.  Medication #1: Fentanyl patch Pill/Patch Count: 1 of 15 pills remain Pill/Patch Appearance: Markings consistent with prescribed medication Bottle Appearance: Standard pharmacy container. Clearly labeled. Filled Date: 58 / 19 / 2019 Last Medication intake:  Day before yesterday  Medication #2: Oxycodone IR Pill/Patch Count: 11 of 120 pills remain Pill/Patch Appearance: Markings consistent with prescribed medication Bottle Appearance: Standard pharmacy container. Clearly labeled. Filled Date: 09 / 21 / 2019 Last Medication intake:  Yesterday

## 2018-04-12 NOTE — Patient Instructions (Signed)
Please call us early February for Dr. Andree Elk schedule for appointment in mid February.   You have been 2 prescriptions for fentanyl patch to last until 06/12/2018.  You have been 2 prescriptions for percocet to last until 06/11/2018.

## 2018-04-13 ENCOUNTER — Telehealth: Payer: Self-pay

## 2018-04-13 NOTE — Telephone Encounter (Signed)
Denies any needs. Instructed me that he didn't have a procedure yesterday.

## 2018-06-07 ENCOUNTER — Other Ambulatory Visit: Payer: Self-pay

## 2018-06-07 ENCOUNTER — Encounter: Payer: Self-pay | Admitting: Anesthesiology

## 2018-06-07 ENCOUNTER — Ambulatory Visit: Payer: Medicare Other | Attending: Anesthesiology | Admitting: Anesthesiology

## 2018-06-07 VITALS — BP 165/68 | HR 65 | Temp 98.0°F | Resp 18 | Ht 68.0 in | Wt 175.0 lb

## 2018-06-07 DIAGNOSIS — G894 Chronic pain syndrome: Secondary | ICD-10-CM | POA: Diagnosis present

## 2018-06-07 DIAGNOSIS — F1190 Opioid use, unspecified, uncomplicated: Secondary | ICD-10-CM | POA: Insufficient documentation

## 2018-06-07 DIAGNOSIS — M1611 Unilateral primary osteoarthritis, right hip: Secondary | ICD-10-CM | POA: Diagnosis present

## 2018-06-07 DIAGNOSIS — M1612 Unilateral primary osteoarthritis, left hip: Secondary | ICD-10-CM | POA: Insufficient documentation

## 2018-06-07 DIAGNOSIS — S32010G Wedge compression fracture of first lumbar vertebra, subsequent encounter for fracture with delayed healing: Secondary | ICD-10-CM | POA: Insufficient documentation

## 2018-06-07 DIAGNOSIS — M4716 Other spondylosis with myelopathy, lumbar region: Secondary | ICD-10-CM | POA: Diagnosis present

## 2018-06-07 DIAGNOSIS — M5441 Lumbago with sciatica, right side: Secondary | ICD-10-CM | POA: Diagnosis present

## 2018-06-07 DIAGNOSIS — M5136 Other intervertebral disc degeneration, lumbar region: Secondary | ICD-10-CM | POA: Insufficient documentation

## 2018-06-07 DIAGNOSIS — M47816 Spondylosis without myelopathy or radiculopathy, lumbar region: Secondary | ICD-10-CM | POA: Insufficient documentation

## 2018-06-07 MED ORDER — FENTANYL 100 MCG/HR TD PT72: 1.0000 | MEDICATED_PATCH | TRANSDERMAL | 0 refills | Status: AC

## 2018-06-07 MED ORDER — OXYCODONE-ACETAMINOPHEN 7.5-325 MG PO TABS: 1.0000 | ORAL_TABLET | Freq: Three times a day (TID) | ORAL | 0 refills | Status: AC | PRN

## 2018-06-07 MED ORDER — FENTANYL 100 MCG/HR TD PT72: 1.0000 | MEDICATED_PATCH | TRANSDERMAL | 0 refills | Status: DC

## 2018-06-07 NOTE — Progress Notes (Signed)
Nursing Pain Medication Assessment:  Safety precautions to be maintained throughout the outpatient stay will include: orient to surroundings, keep bed in low position, maintain call bell within reach at all times, provide assistance with transfer out of bed and ambulation.  Medication Inspection Compliance: Pill count conducted under aseptic conditions, in front of the patient. Neither the pills nor the bottle was removed from the patient's sight at any time. Once count was completed pills were immediately returned to the patient in their original bottle.  Medication #1: Fentanyl patch Pill/Patch Count: 3 of 5 patches remain Pill/Patch Appearance: Markings consistent with prescribed medication Bottle Appearance: Standard pharmacy container. Clearly labeled. Filled Date: 1 / 29 / 2020 Last Medication intake:  Day before yesterday  Medication #2: Oxycodone/APAP Pill/Patch Count: 0 of 30 pills remain Pill/Patch Appearance: Markings consistent with prescribed medication Bottle Appearance: Standard pharmacy container. Clearly labeled. Filled Date: 39 / 19 / 2019 Last Medication intake:  Yesterday

## 2018-06-07 NOTE — Progress Notes (Signed)
Subjective:  Patient ID: [name], male    DOB: Jul 12, 1945  Age: 73 y.o. MRN: [id number]  CC: Back Pain (lower)   Procedure: None  HPI [name] presents for reevaluation.  He was last seen 2 months ago and has been doing better in regards to his low back pain and recent vertebral compression fracture.  The pain though still present is more moderate and consistent with his baseline low back pain and bilateral hip pain.  Mainly of a gnawing aching type pain, it continues to persist despite conservative management.  He still takes his Duragesic patch every other day and uses 1 Percocet per day on average for breakthrough pain.  He never uses more than 2/day.  He saves these 4 days where he is having severe pain.  The quality characteristic and distribution of the pain are otherwise stable in nature.  Based on his narcotic assessment sheet he continues to derive good functional lifestyle provement with the medicine and no side effects are noted.  Otherwise he is in his usual state of health.  Outpatient Medications Prior to Visit  Medication Sig Dispense Refill  . aspirin 81 MG chewable tablet Chew by mouth daily.    Marland Kitchen atorvastatin (LIPITOR) 10 MG tablet Take 20 mg by mouth daily.     . busPIRone (BUSPAR) 15 MG tablet Take 15 mg by mouth 3 (three) times daily.     . butalbital-aspirin-caffeine (FIORINAL) 50-325-40 MG capsule Take 1 capsule by mouth every 6 (six) hours as needed for headache.     . diazepam (VALIUM) 5 MG tablet Take 5 mg by mouth every 6 (six) hours as needed.     . docusate sodium (COLACE) 100 MG capsule Take 100 mg by mouth 2 (two) times daily.    . DULoxetine (CYMBALTA) 30 MG capsule Take 30 mg by mouth daily.    Marland Kitchen gemfibrozil (LOPID) 600 MG tablet Take 600 mg by mouth 2 (two) times daily before a meal.    . lidocaine (XYLOCAINE) 5 % ointment as directed.     Marland Kitchen lisinopril (PRINIVIL,ZESTRIL) 10 MG tablet Take 20 mg by mouth daily.     . metoprolol tartrate  (LOPRESSOR) 25 MG tablet Take 25 mg by mouth daily.    . predniSONE (DELTASONE) 10 MG tablet Take 1 tablet (10 mg total) by mouth daily. (Patient not taking: Reported on 04/12/2018) 42 tablet 0  . tamsulosin (FLOMAX) 0.4 MG CAPS capsule Take 0.4 mg by mouth daily.    . fentaNYL (DURAGESIC - DOSED MCG/HR) 100 MCG/HR Place 1 patch (100 mcg total) onto the skin every other day. 15 patch 0  . oxyCODONE-acetaminophen (PERCOCET) 7.5-325 MG tablet Take 1 tablet by mouth every 8 (eight) hours as needed for moderate pain or severe pain. 30 tablet 0   No facility-administered medications prior to visit.     Review of Systems CNS: No confusion or sedation Cardiac: No angina or palpitations GI: No abdominal pain or constipation Constitutional: No nausea vomiting fevers or chills  Objective:  Resp 18   Ht 5\' 8"  (1.727 m)   Wt 175 lb (79.4 kg)   BMI 26.61 kg/m    BP Readings from Last 3 Encounters:  04/12/18 (!) 153/61  02/13/18 129/64  12/22/17 (!) 150/61     Wt Readings from Last 3 Encounters:  06/07/18 175 lb (79.4 kg)  04/12/18 174 lb (78.9 kg)  02/13/18 172 lb (78 kg)     Physical Exam Pt is alert  and oriented PERRL EOMI HEART IS RRR no murmur or rub LCTA no wheezing or rales MUSCULOSKELETAL reveals some paraspinous muscle tenderness worse on the right side than left in the lumbar region.  His strength is at baseline.  He is walking with a cane and has a antalgic gait.  Labs  No results found for: HGBA1C Lab Results  Component Value Date   CREATININE 1.20 06/10/2016    -------------------------------------------------------------------------------------------------------------------- Lab Results  Component Value Date   CREATININE 1.20 06/10/2016    --------------------------------------------------------------------------------------------------------------------- Dg C-arm 1-60 Min-no Report  Result Date: 12/22/2017 Fluoroscopy was utilized by the requesting  physician.  No radiographic interpretation.     Assessment & Plan:   [name] was seen today for back pain.  Diagnoses and all orders for this visit:  Acute bilateral low back pain with right-sided sciatica  Chronic pain syndrome  Chronic, continuous use of opioids  Facet arthritis of lumbar region  DDD (degenerative disc disease), lumbar  Compression fracture of L1 vertebra with delayed healing, subsequent encounter  Spondylosis, lumbar, with myelopathy  Primary osteoarthritis of left hip  Primary osteoarthritis of right hip  Other orders -     fentaNYL (DURAGESIC) 100 MCG/HR; Place 1 patch onto the skin every other day for 30 days. -     fentaNYL (DURAGESIC) 100 MCG/HR; Place 1 patch onto the skin every other day for 30 days. -     oxyCODONE-acetaminophen (PERCOCET) 7.5-325 MG tablet; Take 1 tablet by mouth every 8 (eight) hours as needed for up to 30 days for moderate pain or severe pain. -     oxyCODONE-acetaminophen (PERCOCET) 7.5-325 MG tablet; Take 1 tablet by mouth every 8 (eight) hours as needed for up to 30 days for moderate pain or severe pain.        ----------------------------------------------------------------------------------------------------------------------  Problem List Items Addressed This Visit      Unprioritized   DDD (degenerative disc disease), lumbar   Relevant Medications   fentaNYL (DURAGESIC) 100 MCG/HR (Start on 06/12/2018)   fentaNYL ([place]) 100 MCG/HR (Start on 07/12/2018)   oxyCODONE-acetaminophen (PERCOCET) 7.5-325 MG tablet   oxyCODONE-acetaminophen (PERCOCET) 7.5-325 MG tablet (Start on 07/07/2018)   Facet arthritis of lumbar region   Relevant Medications   fentaNYL (DURAGESIC) 100 MCG/HR (Start on 06/12/2018)   fentaNYL (DURAGESIC) 100 MCG/HR (Start on 07/12/2018)   oxyCODONE-acetaminophen (PERCOCET) 7.5-325 MG tablet   oxyCODONE-acetaminophen (PERCOCET) 7.5-325 MG tablet (Start on 07/07/2018)    Other Visit Diagnoses     Acute bilateral low back pain with right-sided sciatica    -  Primary   Relevant Medications   fentaNYL (DURAGESIC) 100 MCG/HR (Start on 06/12/2018)   fentaNYL ([place]) 100 MCG/HR (Start on 07/12/2018)   oxyCODONE-acetaminophen (PERCOCET) 7.5-325 MG tablet   oxyCODONE-acetaminophen (PERCOCET) 7.5-325 MG tablet (Start on 07/07/2018)   Chronic pain syndrome       Chronic, continuous use of opioids       Compression fracture of L1 vertebra with delayed healing, subsequent encounter       Spondylosis, lumbar, with myelopathy       Primary osteoarthritis of left hip       Relevant Medications   fentaNYL (DURAGESIC) 100 MCG/HR (Start on 06/12/2018)   fentaNYL (DURAGESIC) 100 MCG/HR (Start on 07/12/2018)   oxyCODONE-acetaminophen (PERCOCET) 7.5-325 MG tablet   oxyCODONE-acetaminophen (PERCOCET) 7.5-325 MG tablet (Start on 07/07/2018)   Primary osteoarthritis of right hip       Relevant Medications   fentaNYL (DURAGESIC) 100 MCG/HR (Start on 06/12/2018)  fentaNYL (DURAGESIC) 100 MCG/HR (Start on 07/12/2018)   oxyCODONE-acetaminophen (PERCOCET) 7.5-325 MG tablet   oxyCODONE-acetaminophen (PERCOCET) 7.5-325 MG tablet (Start on 07/07/2018)        ----------------------------------------------------------------------------------------------------------------------  1. Acute bilateral low back pain with right-sided sciatica Continue with core stretching strengthening exercises once again reviewed with return to clinic in 2 months for continued management.  2. Chronic pain syndrome We have reviewed the [facility] practitioner database information and it is appropriate.  3. Chronic, continuous use of opioids Refills will be given for the Duragesic dated February 17 and March 18.  Percocet will be dated for February 12 and March 13.  He is to return to clinic in 2 months  4. Facet arthritis of lumbar region   5. DDD (degenerative disc disease), lumbar   6. Compression fracture of L1  vertebra with delayed healing, subsequent encounter   7. Spondylosis, lumbar, with myelopathy   8. Primary osteoarthritis of left hip Continue follow-up with primary care physicians as instructed  9. Primary osteoarthritis of right hip     ----------------------------------------------------------------------------------------------------------------------  I have changed [name]'s fentaNYL and oxyCODONE-acetaminophen. I am also having him start on fentaNYL and oxyCODONE-acetaminophen. Additionally, I am having him maintain his docusate sodium, tamsulosin, gemfibrozil, lisinopril, metoprolol tartrate, butalbital-aspirin-caffeine, atorvastatin, busPIRone, diazepam, lidocaine, predniSONE, aspirin, and DULoxetine.   Meds ordered this encounter  Medications  . fentaNYL (DURAGESIC) 100 MCG/HR    Sig: Place 1 patch onto the skin every other day for 30 days.    Dispense:  15 patch    Refill:  0    No early refill.  30 day supply  . fentaNYL (DURAGESIC) 100 MCG/HR    Sig: Place 1 patch onto the skin every other day for 30 days.    Dispense:  15 patch    Refill:  0    30 day supply and no early refill  . oxyCODONE-acetaminophen (PERCOCET) 7.5-325 MG tablet    Sig: Take 1 tablet by mouth every 8 (eight) hours as needed for up to 30 days for moderate pain or severe pain.    Dispense:  30 tablet    Refill:  0    30 day supply.  No early refill  . oxyCODONE-acetaminophen (PERCOCET) 7.5-325 MG tablet    Sig: Take 1 tablet by mouth every 8 (eight) hours as needed for up to 30 days for moderate pain or severe pain.    Dispense:  30 tablet    Refill:  0    30 day supply and no early refill   Patient's Medications  New Prescriptions   FENTANYL (DURAGESIC) 100 MCG/HR    Place 1 patch onto the skin every other day for 30 days.   OXYCODONE-ACETAMINOPHEN (PERCOCET) 7.5-325 MG TABLET    Take 1 tablet by mouth every 8 (eight) hours as needed for up to 30 days for moderate pain or  severe pain.  Previous Medications   ASPIRIN 81 MG CHEWABLE TABLET    Chew by mouth daily.   ATORVASTATIN (LIPITOR) 10 MG TABLET    Take 20 mg by mouth daily.    BUSPIRONE (BUSPAR) 15 MG TABLET    Take 15 mg by mouth 3 (three) times daily.    BUTALBITAL-ASPIRIN-CAFFEINE (FIORINAL) 50-325-40 MG CAPSULE    Take 1 capsule by mouth every 6 (six) hours as needed for headache.    DIAZEPAM (VALIUM) 5 MG TABLET    Take 5 mg by mouth every 6 (six) hours as needed.  DOCUSATE SODIUM (COLACE) 100 MG CAPSULE    Take 100 mg by mouth 2 (two) times daily.   DULOXETINE (CYMBALTA) 30 MG CAPSULE    Take 30 mg by mouth daily.   GEMFIBROZIL (LOPID) 600 MG TABLET    Take 600 mg by mouth 2 (two) times daily before a meal.   LIDOCAINE (XYLOCAINE) 5 % OINTMENT    as directed.    LISINOPRIL (PRINIVIL,ZESTRIL) 10 MG TABLET    Take 20 mg by mouth daily.    METOPROLOL TARTRATE (LOPRESSOR) 25 MG TABLET    Take 25 mg by mouth daily.   PREDNISONE (DELTASONE) 10 MG TABLET    Take 1 tablet (10 mg total) by mouth daily.   TAMSULOSIN (FLOMAX) 0.4 MG CAPS CAPSULE    Take 0.4 mg by mouth daily.  Modified Medications   Modified Medication Previous Medication   FENTANYL (DURAGESIC) 100 MCG/HR fentaNYL (DURAGESIC - DOSED MCG/HR) 100 MCG/HR      Place 1 patch onto the skin every other day for 30 days.    Place 1 patch (100 mcg total) onto the skin every other day.   OXYCODONE-ACETAMINOPHEN (PERCOCET) 7.5-325 MG TABLET oxyCODONE-acetaminophen (PERCOCET) 7.5-325 MG tablet      Take 1 tablet by mouth every 8 (eight) hours as needed for up to 30 days for moderate pain or severe pain.    Take 1 tablet by mouth every 8 (eight) hours as needed for moderate pain or severe pain.  Discontinued Medications   No medications on file   ----------------------------------------------------------------------------------------------------------------------  Follow-up: Return in about 2 months (around 08/06/2018) for evaluation, med refill.     Molli Barrows, MD

## 2018-06-07 NOTE — Progress Notes (Signed)
Nursing Pain Medication Assessment:  Safety precautions to be maintained throughout the outpatient stay will include: orient to surroundings, keep bed in low position, maintain call bell within reach at all times, provide assistance with transfer out of bed and ambulation.  Medication Inspection Compliance: Pill count conducted under aseptic conditions, in front of the patient. Neither the pills nor the bottle was removed from the patient's sight at any time. Once count was completed pills were immediately returned to the patient in their original bottle.  Medication #1: Fentanyl patch Pill/Patch Count: 03 of 5 patches remain Pill/Patch Appearance: Markings consistent with prescribed medication Bottle Appearance: Standard pharmacy container. Clearly labeled. Filled Date: 01 / 18 / 2020 Last Medication intake:  Day before yesterday  Medication #2: Oxycodone IR Pill/Patch Count: 0 of 30 pills remain Pill/Patch Appearance: Markings consistent with prescribed medication Bottle Appearance: Standard pharmacy container. Clearly labeled. Filled Date: 01 / 17 / 2020 Last Medication intake:  Yesterday

## 2018-08-07 ENCOUNTER — Other Ambulatory Visit: Payer: Self-pay

## 2018-08-07 ENCOUNTER — Ambulatory Visit: Payer: Medicare Other | Attending: Anesthesiology | Admitting: Anesthesiology

## 2018-08-07 DIAGNOSIS — M1611 Unilateral primary osteoarthritis, right hip: Secondary | ICD-10-CM

## 2018-08-07 DIAGNOSIS — G8929 Other chronic pain: Secondary | ICD-10-CM

## 2018-08-07 DIAGNOSIS — F1190 Opioid use, unspecified, uncomplicated: Secondary | ICD-10-CM

## 2018-08-07 DIAGNOSIS — M25512 Pain in left shoulder: Secondary | ICD-10-CM

## 2018-08-07 DIAGNOSIS — M4716 Other spondylosis with myelopathy, lumbar region: Secondary | ICD-10-CM

## 2018-08-07 DIAGNOSIS — G894 Chronic pain syndrome: Secondary | ICD-10-CM

## 2018-08-07 DIAGNOSIS — M25511 Pain in right shoulder: Secondary | ICD-10-CM

## 2018-08-07 DIAGNOSIS — M47816 Spondylosis without myelopathy or radiculopathy, lumbar region: Secondary | ICD-10-CM

## 2018-08-07 DIAGNOSIS — M5441 Lumbago with sciatica, right side: Secondary | ICD-10-CM | POA: Diagnosis not present

## 2018-08-07 DIAGNOSIS — S32010D Wedge compression fracture of first lumbar vertebra, subsequent encounter for fracture with routine healing: Secondary | ICD-10-CM

## 2018-08-07 DIAGNOSIS — M5136 Other intervertebral disc degeneration, lumbar region: Secondary | ICD-10-CM

## 2018-08-07 DIAGNOSIS — M542 Cervicalgia: Secondary | ICD-10-CM

## 2018-08-07 DIAGNOSIS — M1612 Unilateral primary osteoarthritis, left hip: Secondary | ICD-10-CM

## 2018-08-07 DIAGNOSIS — S32010G Wedge compression fracture of first lumbar vertebra, subsequent encounter for fracture with delayed healing: Secondary | ICD-10-CM

## 2018-08-07 MED ORDER — OXYCODONE-ACETAMINOPHEN 7.5-325 MG PO TABS: 1.0000 | ORAL_TABLET | Freq: Every day | ORAL | 0 refills | Status: AC | PRN

## 2018-08-07 MED ORDER — OXYCODONE-ACETAMINOPHEN 7.5-325 MG PO TABS: 1.0000 | ORAL_TABLET | Freq: Every day | ORAL | 0 refills | Status: AC

## 2018-08-07 MED ORDER — FENTANYL 100 MCG/HR TD PT72: 1.0000 | MEDICATED_PATCH | TRANSDERMAL | 0 refills | Status: AC

## 2018-08-07 NOTE — Progress Notes (Signed)
Virtual Visit via Telephone Note  I connected with [name] on 08/07/18 at  3:30 PM EDT by telephone and verified that I am speaking with the correct person using two identifiers.   I discussed the limitations, risks, security and privacy concerns of performing an evaluation and management service by telephone and the availability of in person appointments. I also discussed with the patient that there may be a patient responsible charge related to this service. The patient expressed understanding and agreed to proceed.   History of Present Illness: In conversation with [name], he is doing well with his low back pain and diffuse body pain.  The quality characteristic and distribution the pain remains stable.  He is tolerating his medications and they are giving him good relief.  Based on his previous narcotic assessment sheets, he seems to be doing about as well.  This was a virtual visit but based on our discussion today he seems to be tolerating his medications with no side effects.  No diverting or illicit use is documented.  The quality characteristic and distribution of the pain also have been stable in nature.    Observations/Objective:   Assessment and Plan: 1. Acute bilateral low back pain with right-sided sciatica   2. Chronic pain syndrome   3. Chronic, continuous use of opioids   4. Facet arthritis of lumbar region   5. DDD (degenerative disc disease), lumbar   6. Compression fracture of L1 vertebra with delayed healing, subsequent encounter   7. Spondylosis, lumbar, with myelopathy   8. Primary osteoarthritis of left hip   9. Primary osteoarthritis of right hip   10. Compression fracture of L1 vertebra with routine healing, subsequent encounter   11. Cervicalgia   12. Chronic pain of both shoulders   At this point we will continue his current pain management regimen.  I have reviewed the practitioner database information and it is appropriate.  Subsequently we will  refill his Duragesic for April 17 and May 17 to be taken every other day topically and we will also refill his Percocet for as needed use on April 13 and May 13.  We will have him scheduled for return to clinic in 2 months. Follow Up Instructions:    I discussed the assessment and treatment plan with the patient. The patient was provided an opportunity to ask questions and all were answered. The patient agreed with the plan and demonstrated an understanding of the instructions.   The patient was advised to call back or seek an in-person evaluation if the symptoms worsen or if the condition fails to improve as anticipated.  I provided 20 minutes of non-face-to-face time during this encounter.   Molli Barrows, MD

## 2020-05-01 ENCOUNTER — Other Ambulatory Visit: Payer: Self-pay | Admitting: Internal Medicine

## 2020-05-01 DIAGNOSIS — R221 Localized swelling, mass and lump, neck: Secondary | ICD-10-CM

## 2020-05-09 ENCOUNTER — Other Ambulatory Visit: Payer: Self-pay

## 2020-05-09 ENCOUNTER — Ambulatory Visit
Admission: RE | Admit: 2020-05-09 | Discharge: 2020-05-09 | Disposition: A | Discharge status: Home / Self Care | Payer: Medicare Other | Source: Ambulatory Visit | Attending: Internal Medicine | Admitting: Internal Medicine

## 2020-05-09 DIAGNOSIS — R221 Localized swelling, mass and lump, neck: Secondary | ICD-10-CM | POA: Insufficient documentation

## 2020-05-21 ENCOUNTER — Other Ambulatory Visit: Payer: Self-pay | Admitting: Internal Medicine

## 2020-05-21 DIAGNOSIS — R221 Localized swelling, mass and lump, neck: Secondary | ICD-10-CM

## 2020-05-29 ENCOUNTER — Other Ambulatory Visit: Payer: Self-pay

## 2020-05-29 ENCOUNTER — Ambulatory Visit
Admission: RE | Admit: 2020-05-29 | Discharge: 2020-05-29 | Disposition: A | Discharge status: Home / Self Care | Payer: Medicare Other | Source: Ambulatory Visit | Attending: Internal Medicine | Admitting: Internal Medicine

## 2020-05-29 DIAGNOSIS — R221 Localized swelling, mass and lump, neck: Secondary | ICD-10-CM | POA: Diagnosis present

## 2020-05-29 LAB — POCT I-STAT CREATININE: Creatinine, Ser: 1.1 mg/dL (ref 0.61–1.24)

## 2020-05-29 MED ORDER — IOHEXOL 300 MG/ML  SOLN
75.0000 mL | Freq: Once | INTRAMUSCULAR | Status: AC | PRN
  Administered 2020-05-29: 75 mL via INTRAVENOUS

## 2020-06-24 ENCOUNTER — Other Ambulatory Visit: Payer: Self-pay | Admitting: Otolaryngology

## 2020-06-24 DIAGNOSIS — L040 Acute lymphadenitis of face, head and neck: Secondary | ICD-10-CM

## 2020-07-01 ENCOUNTER — Other Ambulatory Visit: Payer: Self-pay | Admitting: Student

## 2020-07-02 ENCOUNTER — Ambulatory Visit
Admission: RE | Admit: 2020-07-02 | Discharge: 2020-07-02 | Disposition: A | Discharge status: Home / Self Care | Payer: Medicare Other | Source: Ambulatory Visit | Attending: Otolaryngology | Admitting: Otolaryngology

## 2020-07-02 ENCOUNTER — Other Ambulatory Visit: Payer: Self-pay

## 2020-07-02 DIAGNOSIS — C8351 Lymphoblastic (diffuse) lymphoma, lymph nodes of head, face, and neck: Secondary | ICD-10-CM | POA: Diagnosis not present

## 2020-07-02 DIAGNOSIS — R590 Localized enlarged lymph nodes: Secondary | ICD-10-CM | POA: Insufficient documentation

## 2020-07-02 DIAGNOSIS — L040 Acute lymphadenitis of face, head and neck: Secondary | ICD-10-CM

## 2020-07-02 NOTE — Procedures (Signed)
Pre Procedure Dx: Cervical lymphadenopathy Post Procedural Dx: Same  Technically successful US guided biopsy of dominant enlarged right cervical lymph node.   EBL: None No immediate complications.   Ronny Bacon, MD Pager #: [phone]

## 2020-07-04 ENCOUNTER — Encounter: Payer: Self-pay | Admitting: Otolaryngology

## 2020-07-11 ENCOUNTER — Encounter: Payer: Self-pay | Admitting: *Deleted

## 2020-07-11 ENCOUNTER — Encounter: Payer: Self-pay | Admitting: Internal Medicine

## 2020-07-15 ENCOUNTER — Encounter: Payer: Self-pay | Admitting: Internal Medicine

## 2020-07-15 ENCOUNTER — Inpatient Hospital Stay: Payer: Medicare Other | Attending: Internal Medicine | Admitting: Internal Medicine

## 2020-07-15 ENCOUNTER — Inpatient Hospital Stay: Payer: Medicare Other

## 2020-07-15 DIAGNOSIS — C8591 Non-Hodgkin lymphoma, unspecified, lymph nodes of head, face, and neck: Secondary | ICD-10-CM | POA: Insufficient documentation

## 2020-07-15 DIAGNOSIS — C8331 Diffuse large B-cell lymphoma, lymph nodes of head, face, and neck: Secondary | ICD-10-CM | POA: Diagnosis present

## 2020-07-15 DIAGNOSIS — I2510 Atherosclerotic heart disease of native coronary artery without angina pectoris: Secondary | ICD-10-CM | POA: Insufficient documentation

## 2020-07-15 DIAGNOSIS — C8330 Diffuse large B-cell lymphoma, unspecified site: Secondary | ICD-10-CM | POA: Insufficient documentation

## 2020-07-15 DIAGNOSIS — Z5181 Encounter for therapeutic drug level monitoring: Secondary | ICD-10-CM

## 2020-07-15 DIAGNOSIS — Z79899 Other long term (current) drug therapy: Secondary | ICD-10-CM | POA: Insufficient documentation

## 2020-07-15 LAB — CBC WITH DIFFERENTIAL/PLATELET
Abs Immature Granulocytes: 0.01 10*3/uL (ref 0.00–0.07)
Basophils Absolute: 0 10*3/uL (ref 0.0–0.1)
Basophils Relative: 1 %
Eosinophils Absolute: 0.1 10*3/uL (ref 0.0–0.5)
Eosinophils Relative: 1 %
HCT: 40.8 % (ref 39.0–52.0)
Hemoglobin: 13.9 g/dL (ref 13.0–17.0)
Immature Granulocytes: 0 %
Lymphocytes Relative: 16 %
Lymphs Abs: 0.9 10*3/uL (ref 0.7–4.0)
MCH: 33 pg (ref 26.0–34.0)
MCHC: 34.1 g/dL (ref 30.0–36.0)
MCV: 96.9 fL (ref 80.0–100.0)
Monocytes Absolute: 0.5 10*3/uL (ref 0.1–1.0)
Monocytes Relative: 9 %
Neutro Abs: 4.2 10*3/uL (ref 1.7–7.7)
Neutrophils Relative %: 73 %
Platelets: 196 10*3/uL (ref 150–400)
RBC: 4.21 MIL/uL — ABNORMAL LOW (ref 4.22–5.81)
RDW: 12.6 % (ref 11.5–15.5)
WBC: 5.7 10*3/uL (ref 4.0–10.5)
nRBC: 0 % (ref 0.0–0.2)

## 2020-07-15 LAB — COMPREHENSIVE METABOLIC PANEL
ALT: 10 U/L (ref 0–44)
AST: 19 U/L (ref 15–41)
Albumin: 4.2 g/dL (ref 3.5–5.0)
Alkaline Phosphatase: 138 U/L — ABNORMAL HIGH (ref 38–126)
Anion gap: 10 (ref 5–15)
BUN: 31 mg/dL — ABNORMAL HIGH (ref 8–23)
CO2: 24 mmol/L (ref 22–32)
Calcium: 9.1 mg/dL (ref 8.9–10.3)
Chloride: 104 mmol/L (ref 98–111)
Creatinine, Ser: 1.37 mg/dL — ABNORMAL HIGH (ref 0.61–1.24)
GFR, Estimated: 54 mL/min — ABNORMAL LOW (ref >60)
Glucose, Bld: 92 mg/dL (ref 70–99)
Potassium: 4.8 mmol/L (ref 3.5–5.1)
Sodium: 138 mmol/L (ref 135–145)
Total Bilirubin: 0.5 mg/dL (ref 0.3–1.2)
Total Protein: 8.6 g/dL — ABNORMAL HIGH (ref 6.5–8.1)

## 2020-07-15 LAB — HEPATITIS B CORE ANTIBODY, IGM: Hep B C IgM: NONREACTIVE

## 2020-07-15 LAB — HEPATITIS B SURFACE ANTIGEN: Hepatitis B Surface Ag: NONREACTIVE

## 2020-07-15 LAB — LACTATE DEHYDROGENASE: LDH: 118 U/L (ref 98–192)

## 2020-07-15 NOTE — Progress Notes (Signed)
Back in 2013 had aortic valve replacement and stents. A concern wife has is if pt has lymphoma will tx affect heart disease. Was told heart valve last 8 years and this is the 8 th year.

## 2020-07-15 NOTE — Assessment & Plan Note (Addendum)
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  Recommend a PET scan ASAP for staging purposes.  Discussed the possible need for bone marrow biopsy however hold off when pending PET scan.  #Discussed that the goal of treatment for lymphoma in general is cure.  However, pending staging-difficult to prognosticate/chance of cure etc.   # Discussed R-CHOP chemotherapy every 3 weeks; the number of cycles-will depend upon the stage of cancer.  Radiation will be offered for limited stage disease.  Discussed regarding the general cardiotoxic nature of chemotherapy especially given patient's CAD.  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  Stable.  No concerns for any congestive heart failure.  #Recommend port placement; 2D echo; chemo ed.   Thank you Dr.Bennett for allowing me to participate in the care of your pleasant patient. Please do not hesitate to contact me with questions or concerns in the interim.  I spoke at length with the patient's wife/daughter & son [over the phone]- regarding the patient's clinical status/plan of care.  Family agreement.  With regards to family's question regarding evaluation at [facility]; I would defer to PCP regarding referral to [facility] oncology.   # DISPOSITION: # labs today # 2 d echo ASAP # PET scan; chemo ed # follow up in 1-2 days after the PET scan-Dr.B

## 2020-07-15 NOTE — Progress Notes (Signed)
[facility] CONSULT NOTE  Patient Care Team: Idelle Crouch, MD as PCP - General (Internal Medicine)  CHIEF COMPLAINTS/PURPOSE OF CONSULTATION: DLBCL    Oncology History Overview Note  # MARCH 2022- RIGHT CERVICAL; ULTRASOUND-GUIDED CORE NEEDLE BIOPSY: - DIFFUSE LARGE B-CELL LYMPHOMA, GERMINAL CENTER IMMUNOPHENOTYPE, TRIPLE  EXPRESSOR BY IMMUNOHISTOCHEMISTRY (BCL-2, BCL-6, AND C-MYC). 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  Patient noted to have swelling in the right neck sometime in October/November 2021.  He was treated with antibiotics with some improvement without any resolution.  He was finally evaluated by ENT; which was further evaluated with an ultrasound-guided biopsy-positive for diffuse large B-cell lymphoma as above.  Patient also has trouble swallowing-for the last 6 to 12 months which he attributes to thick phlegm.  Both to solids and liquids.   Patient denies any significant weight loss in the last 6 months.  No night sweats.  No fevers.  Review of Systems  Constitutional: Positive for malaise/fatigue. Negative for chills, diaphoresis, fever and weight loss.  HENT: Negative for nosebleeds and sore throat.   Eyes: Negative for double vision.  Respiratory: Negative for cough, hemoptysis, sputum production, shortness of breath and wheezing.   Cardiovascular: Negative for chest pain, palpitations, orthopnea and leg swelling.  Gastrointestinal: Positive for constipation. Negative for abdominal pain, blood in stool, diarrhea, heartburn, melena, nausea and  vomiting.  Genitourinary: Negative for dysuria, frequency and urgency.  Musculoskeletal: Positive for back pain and joint pain.  Skin: Negative.  Negative for itching and rash.  Neurological: Positive for headaches. Negative for dizziness, tingling, focal weakness and weakness.  Endo/Heme/Allergies: Does not bruise/bleed easily.  Psychiatric/Behavioral: Negative for depression. The patient is not nervous/anxious and does not have insomnia.      MEDICAL HISTORY:  Past Medical History:  Diagnosis Date   Aortic stenosis    CAD (coronary artery disease)    Cervicalgia    CHF (congestive heart failure) (HCC)    Chronic bilateral low back pain with right-sided sciatica    COPD (chronic obstructive pulmonary disease) (HCC)    DDD (degenerative disc disease), lumbar    Hyperlipidemia    Hypertension    Large B-cell lymphoma (HCC)    Pain syndrome, chronic    Pre-diabetes    PVD (peripheral vascular disease) (HCC)    Spondylosis, lumbar, with myelopathy    Tobacco abuse     SURGICAL HISTORY: Past Surgical History:  Procedure Laterality Date   cardiac stents     CORONARY ANGIOPLASTY     CORONARY ARTERY BYPASS GRAFT     VSD REPAIR      SOCIAL HISTORY: Social History   Socioeconomic History   Marital status: Married    Spouse name: Not on file   Number of children: Not on file   Years of education: Not on file   Highest education level: Not on file  Occupational History   Not on file  Tobacco Use   Smoking status: Current Every Day Smoker    Packs/day: 1.00    Years: 40.00    Pack years: 40.00    Types: Cigarettes  Smokeless tobacco: Never Used   Tobacco comment: 6-8 cigarettes per day  Substance and Sexual Activity   Alcohol use: No    Alcohol/week: 0.0 standard drinks   Drug use: No   Sexual activity: Yes  Other Topics Concern   Not on file  Social History Narrative   Not on file   Social Determinants of Health   Financial  Resource Strain: Not on file  Food Insecurity: Not on file  Transportation Needs: Not on file  Physical Activity: Not on file  Stress: Not on file  Social Connections: Not on file  Intimate Partner Violence: Not on file    FAMILY HISTORY: Family History  Problem Relation Age of Onset   Arthritis Mother    Heart disease Mother        died During open heart surgery   Early death Father    Heart disease Father        Coronary Artery Disease    Asthma Son    Thyroid disease Sister     ALLERGIES:  is allergic to celebrex [celecoxib], carvedilol, and penicillins.  MEDICATIONS:  Current Outpatient Medications  Medication Sig Dispense Refill   aspirin 81 MG chewable tablet Chew by mouth daily.     atorvastatin (LIPITOR) 10 MG tablet Take 20 mg by mouth daily.      busPIRone (BUSPAR) 15 MG tablet Take 15 mg by mouth 3 (three) times daily.      butalbital-aspirin-caffeine (FIORINAL) 50-325-40 MG capsule Take 1 capsule by mouth every 6 (six) hours as needed for headache.      citalopram (CELEXA) 20 MG tablet Take 20 mg by mouth daily.     diazepam (VALIUM) 5 MG tablet Take 5 mg by mouth every 6 (six) hours as needed.      docusate sodium (COLACE) 100 MG capsule Take 100 mg by mouth 2 (two) times daily.     fentaNYL (DURAGESIC) 100 MCG/HR 1 patch every other day.     gemfibrozil (LOPID) 600 MG tablet Take 600 mg by mouth 2 (two) times daily before a meal.     lidocaine (XYLOCAINE) 5 % ointment as directed.      lisinopril (PRINIVIL,ZESTRIL) 10 MG tablet Take 30 mg by mouth daily.      metoprolol tartrate (LOPRESSOR) 25 MG tablet Take 25 mg by mouth daily.     [START ON 07/18/2020] Oxycodone HCl 10 MG TABS Take by mouth.     tamsulosin (FLOMAX) 0.4 MG CAPS capsule Take 0.4 mg by mouth daily.     No current facility-administered medications for this visit.      Marland Kitchen  PHYSICAL EXAMINATION: ECOG PERFORMANCE STATUS: 1 - Symptomatic but completely ambulatory  Vitals:    07/15/20 1428  BP: (!) 115/54  Pulse: 80  Resp: 16  Temp: (!) 97.5 F (36.4 C)  SpO2: 98%   Filed Weights   07/15/20 1428  Weight: 166 lb 6.4 oz (75.5 kg)    Physical Exam Constitutional:      Comments: Patient is accompanied by his wife.  Is ambulating independently.  However has mild to moderate difficulty getting onto the table because of arthritis.  HENT:     Head: Normocephalic and atraumatic.     Mouth/Throat:     Pharynx: No oropharyngeal exudate.  Eyes:     Pupils: Pupils are equal, round, and reactive to light.  Neck:     Comments: Right neck multiple shotty lymph nodes noted-mildly tender no significant erythema warmth./ Cardiovascular:  Rate and Rhythm: Normal rate and regular rhythm.  Pulmonary:     Effort: Pulmonary effort is normal. No respiratory distress.     Breath sounds: Normal breath sounds. No wheezing.  Abdominal:     General: Bowel sounds are normal. There is no distension.     Palpations: Abdomen is soft. There is no mass.     Tenderness: There is no abdominal tenderness. There is no guarding or rebound.  Musculoskeletal:        General: No tenderness. Normal range of motion.     Cervical back: Normal range of motion and neck supple.  Skin:    General: Skin is warm.  Neurological:     Mental Status: He is alert and oriented to person, place, and time.  Psychiatric:        Mood and Affect: Affect normal.      LABORATORY DATA:  I have reviewed the data as listed Lab Results  Component Value Date   WBC 5.7 07/15/2020   HGB 13.9 07/15/2020   HCT 40.8 07/15/2020   MCV 96.9 07/15/2020   PLT 196 07/15/2020   Recent Labs    05/29/20 1414 07/15/20 1558  NA  --  138  K  --  4.8  CL  --  104  CO2  --  24  GLUCOSE  --  92  BUN  --  31*  CREATININE 1.10 1.37*  CALCIUM  --  9.1  GFRNONAA  --  54*  PROT  --  8.6*  ALBUMIN  --  4.2  AST  --  19  ALT  --  10  ALKPHOS  --  138*  BILITOT  --  0.5    RADIOGRAPHIC STUDIES: I have  personally reviewed the radiological images as listed and agreed with the findings in the report. [place] CORE BIOPSY (LYMPH NODES)  Result Date: 07/02/2020 INDICATION: No known primary, now with right cervical lymphadenopathy. Please perform ultrasound-guided biopsy for tissue diagnostic purposes. EXAM: ULTRASOUND-GUIDED RIGHT CERVICAL LYMPH NODE BIOPSY COMPARISON:  Neck CT-05/29/2020 MEDICATIONS: None ANESTHESIA/SEDATION: None COMPLICATIONS: None immediate. TECHNIQUE: Informed written consent was obtained from the patient after a discussion of the risks, benefits and alternatives to treatment. Questions regarding the procedure were encouraged and answered. Initial ultrasound scanning demonstrated several enlarged right cervical lymph nodes. A dominant approximately 1.7 x 1.0 cm hypoechoic right cervical lymph node, likely correlating with the cervical lymph node seen on preceding neck CT image 79, series 2 was targeted for biopsy given location and sonographic window. An ultrasound image was saved for documentation purposes. The procedure was planned. A timeout was performed prior to the initiation of the procedure. The operative was prepped and draped in the usual sterile fashion, and a sterile drape was applied covering the operative field. A timeout was performed prior to the initiation of the procedure. Local anesthesia was provided with 1% lidocaine with epinephrine. Under direct ultrasound guidance, an 18 gauge core needle device was utilized to obtain to obtain 8 core needle biopsies of the indeterminate right cervical lymph node. The samples were placed in saline and submitted to pathology. The needle was removed and hemostasis was achieved with manual compression. Post procedure scan was negative for significant hematoma. A dressing was placed. The patient tolerated the procedure well without immediate postprocedural complication. IMPRESSION: Technically successful ultrasound guided biopsy of indeterminate  right cervical lymph node. Electronically Signed   By: Sandi Mariscal M.D.   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  GCB subtype; FISH panel pending.  Recommend a PET scan ASAP for staging purposes.  Discussed the possible need for bone marrow biopsy however hold off when pending PET scan.  #Discussed that the goal of treatment for lymphoma in general is cure.  However, pending staging-difficult to prognosticate/chance of cure etc.   # Discussed R-CHOP chemotherapy every 3 weeks; the number of cycles-will depend upon the stage of cancer.  Radiation will be offered for limited stage disease.  Discussed regarding the general cardiotoxic nature of chemotherapy especially given patient's CAD.  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  Stable.  No concerns for any congestive heart failure.  #Recommend port placement; 2D echo; chemo ed.   Thank you Dr.Bennett for allowing me to participate in the care of your pleasant patient. Please do not hesitate to contact me with questions or concerns in the interim.  I spoke at length with the patient's wife/daughter & son [over the phone]- regarding the patient's clinical status/plan of care.  Family agreement.  With regards to family's question regarding evaluation at [facility]; I would defer to PCP regarding referral to [facility] oncology.   # DISPOSITION: # labs today # 2 d echo ASAP # PET scan; chemo ed # follow up in 1-2 days after the PET scan-Dr.B  All questions were answered. The patient knows to call the clinic with any problems, questions or concerns.    Cammie Sickle, MD 07/15/2020 4:31 PM

## 2020-07-16 ENCOUNTER — Telehealth: Payer: Self-pay | Admitting: *Deleted

## 2020-07-16 NOTE — Telephone Encounter (Signed)
Incoming call from patient's wife. Wife requesting to cnl port placement and chemotherapy class until after patient's pet/ct and echo.   The echo is not until 4/4. I have left a message to see if we can get this scanned moved up sooner. Patient hesitant to agree to chemo without the above tests.

## 2020-07-17 ENCOUNTER — Other Ambulatory Visit: Payer: Self-pay | Admitting: Internal Medicine

## 2020-07-17 ENCOUNTER — Encounter: Payer: Self-pay | Admitting: *Deleted

## 2020-07-17 MED ORDER — DIAZEPAM 5 MG PO TABS: ORAL_TABLET | ORAL | 0 refills | Status: DC

## 2020-07-17 NOTE — Telephone Encounter (Signed)
Per colette in scheduling - "PET is on for the 3/31 but they said he might want to have a pill or something for anxiety." Dr. Jacinto Reap please advise

## 2020-07-17 NOTE — Telephone Encounter (Signed)
Mychart message sent re: valium prescription and apts for pet/echo.

## 2020-07-17 NOTE — Telephone Encounter (Signed)
Per Dr. Rogue Bussing - Will send a script for Valium; please let the pt/ family know

## 2020-07-18 ENCOUNTER — Other Ambulatory Visit: Payer: Self-pay

## 2020-07-18 ENCOUNTER — Ambulatory Visit
Admission: RE | Admit: 2020-07-18 | Discharge: 2020-07-18 | Disposition: A | Discharge status: Home / Self Care | Payer: Medicare Other | Source: Ambulatory Visit | Attending: Internal Medicine | Admitting: Internal Medicine

## 2020-07-18 DIAGNOSIS — I509 Heart failure, unspecified: Secondary | ICD-10-CM | POA: Diagnosis not present

## 2020-07-18 DIAGNOSIS — Z79899 Other long term (current) drug therapy: Secondary | ICD-10-CM

## 2020-07-18 DIAGNOSIS — Z5181 Encounter for therapeutic drug level monitoring: Secondary | ICD-10-CM

## 2020-07-18 DIAGNOSIS — C8331 Diffuse large B-cell lymphoma, lymph nodes of head, face, and neck: Secondary | ICD-10-CM | POA: Insufficient documentation

## 2020-07-18 DIAGNOSIS — Z0189 Encounter for other specified special examinations: Secondary | ICD-10-CM | POA: Diagnosis not present

## 2020-07-18 DIAGNOSIS — J449 Chronic obstructive pulmonary disease, unspecified: Secondary | ICD-10-CM | POA: Insufficient documentation

## 2020-07-18 DIAGNOSIS — F17200 Nicotine dependence, unspecified, uncomplicated: Secondary | ICD-10-CM | POA: Insufficient documentation

## 2020-07-18 DIAGNOSIS — E785 Hyperlipidemia, unspecified: Secondary | ICD-10-CM | POA: Diagnosis not present

## 2020-07-18 DIAGNOSIS — I255 Ischemic cardiomyopathy: Secondary | ICD-10-CM | POA: Insufficient documentation

## 2020-07-18 DIAGNOSIS — I080 Rheumatic disorders of both mitral and aortic valves: Secondary | ICD-10-CM | POA: Insufficient documentation

## 2020-07-18 DIAGNOSIS — I110 Hypertensive heart disease with heart failure: Secondary | ICD-10-CM | POA: Diagnosis not present

## 2020-07-18 DIAGNOSIS — I2510 Atherosclerotic heart disease of native coronary artery without angina pectoris: Secondary | ICD-10-CM | POA: Diagnosis not present

## 2020-07-18 LAB — ECHOCARDIOGRAM COMPLETE
AR max vel: 1.8 cm2
AV Area VTI: 1.85 cm2
AV Area mean vel: 1.79 cm2
AV Mean grad: 25 mmHg
AV Peak grad: 51 mmHg
Ao pk vel: 3.57 m/s
Area-P 1/2: 2.93 cm2
Calc EF: 55.8 %
MV VTI: 4.41 cm2
S' Lateral: 2.5 cm
Single Plane A2C EF: 55.7 %
Single Plane A4C EF: 55.9 %

## 2020-07-18 NOTE — Progress Notes (Signed)
*  PRELIMINARY RESULTS* Echocardiogram 2D Echocardiogram has been performed.  [name] [name] 07/18/2020, 9:57 AM

## 2020-07-21 ENCOUNTER — Inpatient Hospital Stay: Payer: Medicare Other

## 2020-07-22 ENCOUNTER — Encounter: Payer: Self-pay | Admitting: Internal Medicine

## 2020-07-22 LAB — SURGICAL PATHOLOGY

## 2020-07-24 ENCOUNTER — Encounter
Admission: RE | Admit: 2020-07-24 | Discharge: 2020-07-24 | Disposition: A | Discharge status: Home / Self Care | Payer: Medicare Other | Source: Ambulatory Visit | Attending: Internal Medicine | Admitting: Internal Medicine

## 2020-07-24 ENCOUNTER — Other Ambulatory Visit: Payer: Self-pay

## 2020-07-24 DIAGNOSIS — I700 Atherosclerosis of aorta: Secondary | ICD-10-CM | POA: Diagnosis not present

## 2020-07-24 DIAGNOSIS — R161 Splenomegaly, not elsewhere classified: Secondary | ICD-10-CM | POA: Diagnosis not present

## 2020-07-24 DIAGNOSIS — R590 Localized enlarged lymph nodes: Secondary | ICD-10-CM | POA: Insufficient documentation

## 2020-07-24 DIAGNOSIS — C8331 Diffuse large B-cell lymphoma, lymph nodes of head, face, and neck: Secondary | ICD-10-CM | POA: Insufficient documentation

## 2020-07-24 DIAGNOSIS — K4090 Unilateral inguinal hernia, without obstruction or gangrene, not specified as recurrent: Secondary | ICD-10-CM | POA: Diagnosis not present

## 2020-07-24 DIAGNOSIS — I2510 Atherosclerotic heart disease of native coronary artery without angina pectoris: Secondary | ICD-10-CM | POA: Diagnosis not present

## 2020-07-24 DIAGNOSIS — J439 Emphysema, unspecified: Secondary | ICD-10-CM | POA: Diagnosis not present

## 2020-07-24 LAB — GLUCOSE, CAPILLARY: Glucose-Capillary: 86 mg/dL (ref 70–99)

## 2020-07-24 MED ORDER — FLUDEOXYGLUCOSE F - 18 (FDG) INJECTION
8.6000 | Freq: Once | INTRAVENOUS | Status: AC | PRN
  Administered 2020-07-24: 9.4 via INTRAVENOUS

## 2020-07-25 ENCOUNTER — Ambulatory Visit: Payer: Medicare Other

## 2020-07-25 ENCOUNTER — Encounter: Payer: Self-pay | Admitting: Internal Medicine

## 2020-07-25 ENCOUNTER — Inpatient Hospital Stay: Payer: Medicare Other | Attending: Internal Medicine | Admitting: Internal Medicine

## 2020-07-25 DIAGNOSIS — C8331 Diffuse large B-cell lymphoma, lymph nodes of head, face, and neck: Secondary | ICD-10-CM | POA: Diagnosis present

## 2020-07-25 DIAGNOSIS — F17210 Nicotine dependence, cigarettes, uncomplicated: Secondary | ICD-10-CM | POA: Insufficient documentation

## 2020-07-25 DIAGNOSIS — I2510 Atherosclerotic heart disease of native coronary artery without angina pectoris: Secondary | ICD-10-CM | POA: Diagnosis not present

## 2020-07-25 DIAGNOSIS — Z952 Presence of prosthetic heart valve: Secondary | ICD-10-CM | POA: Diagnosis not present

## 2020-07-25 NOTE — Progress Notes (Signed)
[facility] CONSULT NOTE  Patient Care Team: Idelle Crouch, MD as PCP - General (Internal Medicine)  CHIEF COMPLAINTS/PURPOSE OF CONSULTATION: DLBCL    Oncology History Overview Note  # MARCH 2022- STAGE IV- RIGHT CERVICAL; ULTRASOUND-GUIDED CORE NEEDLE BIOPSY: - DIFFUSE LARGE B-CELL LYMPHOMA, GERMINAL CENTER IMMUNOPHENOTYPE, TRIPLE  EXPRESSOR BY IMMUNOHISTOCHEMISTRY (BCL-2, BCL-6, AND C-MYC). FISH-QNS [Dr.Bennett]; MARCH 5956-  Hypermetabolic adenopathy in the right neck, right chest, and AP window along with hypermetabolic external iliac adenopathy and in anterior splenic lesion in the setting of splenomegaly. The visualized index lymph nodes and splenic activity are all Deauville 5. 2. 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  male with history of CAD/porcine aortic valve with new diagnosis of diffuse B-cell lymphoma-is here to review the results of his PET scan/2D echo.  Patient denies any new chest pain or shortness of breath or cough.  He denies any worsening neck swelling or recurrent swelling anywhere else.  No night sweats.  Review of Systems  Constitutional: Positive for malaise/fatigue. Negative for chills, diaphoresis, fever and weight loss.  HENT: Negative for nosebleeds and sore throat.   Eyes:  Negative for double vision.  Respiratory: Negative for cough, hemoptysis, sputum production, shortness of breath and wheezing.   Cardiovascular: Negative for chest pain, palpitations, orthopnea and leg swelling.  Gastrointestinal: Positive for constipation. Negative for abdominal pain, blood in stool, diarrhea, heartburn, melena, nausea and vomiting.  Genitourinary: Negative for dysuria, frequency and urgency.  Musculoskeletal: Positive for back pain and joint pain.  Skin: Negative.  Negative for itching and rash.  Neurological: Positive for headaches. Negative for dizziness, tingling, focal weakness and weakness.  Endo/Heme/Allergies: Does not bruise/bleed easily.  Psychiatric/Behavioral: Negative for depression. The patient is not nervous/anxious and does not have insomnia.      MEDICAL HISTORY:  Past Medical History:  Diagnosis Date  . Aortic stenosis   . CAD (coronary artery disease)   . Cervicalgia   . CHF (congestive heart failure) ([place])   . Chronic bilateral low back pain with right-sided sciatica   . COPD (chronic obstructive pulmonary disease) ([place])   . DDD (degenerative disc disease), lumbar   . Hyperlipidemia   . Hypertension   . Large B-cell lymphoma ([place])   . Pain syndrome, chronic   . Pre-diabetes   . PVD (peripheral vascular disease) ([place])   . Spondylosis, lumbar, with myelopathy   . Tobacco abuse     SURGICAL HISTORY: Past Surgical History:  Procedure Laterality Date  . cardiac stents    . CORONARY ANGIOPLASTY    . CORONARY ARTERY BYPASS GRAFT    . VSD REPAIR      SOCIAL HISTORY: Social History   Socioeconomic History  . Marital status: Married    Spouse name: Not on file  . Number of children: Not on file  . Years of education: Not  on file  . Highest education level: Not on file  Occupational History  . Not on file  Tobacco Use  . Smoking status: Current Every Day Smoker    Packs/day: 1.00    Years: 40.00    Pack years: 40.00    Types:  Cigarettes  . Smokeless tobacco: Never Used  . Tobacco comment: 6-8 cigarettes per day  Substance and Sexual Activity  . Alcohol use: No    Alcohol/week: 0.0 standard drinks  . Drug use: No  . Sexual activity: Yes  Other Topics Concern  . Not on file  Social History Narrative  . Not on file   Social Determinants of Health   Financial Resource Strain: Not on file  Food Insecurity: Not on file  Transportation Needs: Not on file  Physical Activity: Not on file  Stress: Not on file  Social Connections: Not on file  Intimate Partner Violence: Not on file    FAMILY HISTORY: Family History  Problem Relation Age of Onset  . Arthritis Mother   . Heart disease Mother        died During open heart surgery  . Early death Father   . Heart disease Father        Coronary Artery Disease   . Asthma Son   . Thyroid disease Sister     ALLERGIES:  is allergic to celebrex [celecoxib], carvedilol, and penicillins.  MEDICATIONS:  Current Outpatient Medications  Medication Sig Dispense Refill  . aspirin 81 MG chewable tablet Chew by mouth daily.    Marland Kitchen atorvastatin (LIPITOR) 20 MG tablet Take 20 mg by mouth daily.     . busPIRone (BUSPAR) 15 MG tablet Take 15 mg by mouth 3 (three) times daily.     . butalbital-aspirin-caffeine (FIORINAL) 50-325-40 MG capsule Take 1 capsule by mouth every 6 (six) hours as needed for headache.     . citalopram (CELEXA) 20 MG tablet Take 20 mg by mouth daily.    Marland Kitchen docusate sodium (COLACE) 100 MG capsule Take 100 mg by mouth 3 (three) times daily.    . fentaNYL (DURAGESIC) 100 MCG/HR 1 patch every other day.    Marland Kitchen gemfibrozil (LOPID) 600 MG tablet Take 600 mg by mouth 2 (two) times daily before a meal.    . lidocaine (XYLOCAINE) 5 % ointment as directed.     Marland Kitchen lisinopril (PRINIVIL,ZESTRIL) 10 MG tablet Take 30 mg by mouth daily. (take 10 mg in the am and 20 mg in the evening to equal 30 mg daily)    . metoprolol tartrate (LOPRESSOR) 25 MG tablet Take 25 mg by  mouth 2 (two) times daily.    . Oxycodone HCl 10 MG TABS Take 10 mg by mouth every 8 (eight) hours as needed (pain).    . tamsulosin (FLOMAX) 0.4 MG CAPS capsule Take 0.4 mg by mouth daily.    . diazepam (VALIUM) 5 MG tablet One pill 45-60 mins prior to scan; 1 pill 15 mins prior if needed. (Patient not taking: Reported on 07/25/2020) 3 tablet 0   No current facility-administered medications for this visit.      Marland Kitchen  PHYSICAL EXAMINATION: ECOG PERFORMANCE STATUS: 1 - Symptomatic but completely ambulatory  Vitals:   07/25/20 1527  BP: (!) 147/52  Pulse: 75  Resp: 16  Temp: (!) 97.2 F (36.2 C)  SpO2: 100%   Filed Weights   07/25/20 1527  Weight: 167 lb 11.2 oz (76.1 kg)    Physical Exam Constitutional:  Comments: Patient is accompanied by his wife.  Is ambulating independently.  However has mild to moderate difficulty getting onto the table because of arthritis.  HENT:     Head: Normocephalic and atraumatic.     Mouth/Throat:     Pharynx: No oropharyngeal exudate.  Eyes:     Pupils: Pupils are equal, round, and reactive to light.  Neck:     Comments: Right neck multiple shotty lymph nodes noted-mildly tender no significant erythema warmth./ Cardiovascular:     Rate and Rhythm: Normal rate and regular rhythm.  Pulmonary:     Effort: Pulmonary effort is normal. No respiratory distress.     Breath sounds: Normal breath sounds. No wheezing.  Abdominal:     General: Bowel sounds are normal. There is no distension.     Palpations: Abdomen is soft. There is no mass.     Tenderness: There is no abdominal tenderness. There is no guarding or rebound.  Musculoskeletal:        General: No tenderness. Normal range of motion.     Cervical back: Normal range of motion and neck supple.  Skin:    General: Skin is warm.  Neurological:     Mental Status: He is alert and oriented to person, place, and time.  Psychiatric:        Mood and Affect: Affect normal.      LABORATORY  DATA:  I have reviewed the data as listed Lab Results  Component Value Date   WBC 5.7 07/15/2020   HGB 13.9 07/15/2020   HCT 40.8 07/15/2020   MCV 96.9 07/15/2020   PLT 196 07/15/2020   Recent Labs    05/29/20 1414 07/15/20 1558  NA  --  138  K  --  4.8  CL  --  104  CO2  --  24  GLUCOSE  --  92  BUN  --  31*  CREATININE 1.10 1.37*  CALCIUM  --  9.1  GFRNONAA  --  54*  PROT  --  8.6*  ALBUMIN  --  4.2  AST  --  19  ALT  --  10  ALKPHOS  --  138*  BILITOT  --  0.5    RADIOGRAPHIC STUDIES: I have personally reviewed the radiological images as listed and agreed with the findings in the report. NM PET Image Initial (PI) Skull Base To Thigh  Result Date: 07/25/2020 CLINICAL DATA:  Initial treatment strategy for diffuse large B-cell lymphoma. EXAM: NUCLEAR MEDICINE PET SKULL BASE TO THIGH TECHNIQUE: 9.4 mCi F-18 FDG was injected intravenously. Full-ring PET imaging was performed from the skull base to thigh after the radiotracer. CT data was obtained and used for attenuation correction and anatomic localization. Fasting blood glucose: 86 mg/dl COMPARISON:  CT neck 05/29/2020 FINDINGS: Mediastinal blood pool activity: SUV max 2.6 Liver activity: SUV max 3.7 NECK: Hypermetabolic right level IIa, IIb, level III, level IV, level V, and level Ib lymph nodes are present. Index right level IIa lymph node on image 40 of series 3 measures 1.4 cm in short axis (formerly the same) and has a maximum SUV of 11.0, Deauville 5. Symmetric glottic activity noted. Incidental CT findings: Chronic right maxillary sinusitis. Bilateral common carotid atherosclerotic calcification. CHEST: Hypermetabolic right supraclavicular, right axillary, right subpectoral, right paratracheal, and AP window adenopathy observed. Index right paratracheal node 1.1 cm in short axis on image 91 of series 3 with maximum SUV of 8.6, Deauville 5. Index right axillary lymph node 1.5 cm in  short axis on image 105 of series 3, maximum  SUV 7.6, Deauville 5. Incidental CT findings: Coronary, aortic arch, and branch vessel atherosclerotic vascular disease. Emphysema. ABDOMEN/PELVIS: Mild splenomegaly (the spleen measures 11.7 by 8.3 by 9.5 cm, volume = 480 cm^3) with heterogeneous activity in the anterior spleen with maximum SUV up to 9.9, Deauville 5. Hypermetabolic bilateral external iliac lymph nodes, with a right external iliac node measuring 1.3 cm in short axis on image 229 of series 3 with maximum SUV 7.8, Deauville 5. Incidental CT findings: Aortoiliac atherosclerotic vascular disease. Infrarenal abdominal aortic aneurysm up to 3.4 cm in diameter. Right inguinal hernia contains a loop of small bowel along with omental adipose tissue, no obvious strangulation or obstruction. SKELETON: Accentuated metabolic activity along faintly sclerotic lesions along the left greater trochanter and adjacent femoral neck on image 251 of series 3, maximum SUV in this vicinity is 4.3 (Deauville 4), a similar region on the contralateral side has maximum SUV of 0.7. No other significant hypermetabolic osseous activity. Incidental CT findings: Vertebra plana at L1 with some moderate posterior chronic appearing of bony retropulsion (also shown on the MRI from 11/08/2017). Prior median sternotomy. IMPRESSION: 1. Hypermetabolic adenopathy in the right neck, right chest, and AP window along with hypermetabolic external iliac adenopathy and in anterior splenic lesion in the setting of splenomegaly. The visualized index lymph nodes and splenic activity are all Deauville 5. 2. Two small sclerotic lesions in the left greater trochanter region are likewise moderately hypermetabolic, Deauville 4, potentially representing osseous involvement by lymphoma. 3. Other imaging findings of potential clinical significance: Chronic right maxillary sinusitis. Aortic Atherosclerosis (ICD10-I70.0) and Emphysema (ICD10-J43.9). Coronary atherosclerosis. Right inguinal hernia containing a  loop of small bowel without strangulation or obstruction. 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 Electronically Signed   By: Van Clines M.D.   On: 07/25/2020 11:23   ECHOCARDIOGRAM COMPLETE  Result Date: 07/18/2020    ECHOCARDIOGRAM REPORT   Patient Name:   [name] Date of Exam: 07/18/2020 Medical Rec #:  [id number]         Height:       68.0 in Accession #:    [id number]        Weight:       166.4 lb Date of Birth:  1945/09/02        BSA:          1.890 m Patient Age:    75 years          BP:           115/54 mmHg Patient Gender: M                 HR:           77 bpm. Exam Location:  [facility] Procedure: 2D Echo, Color Doppler, Cardiac Doppler and Strain Analysis Indications:     I25.5 Cardiomyopathy-Ischemic  History:         Patient has no prior history of Echocardiogram examinations.                  CHF, CAD, COPD, Aortic Valve Disease, Arrythmias:PVC; Risk                  Factors:Current Smoker, Hypertension and Dyslipidemia. Diffuse                  large B-cell lymphoma of lymph nodes of neck.  Sonographer:     Charmayne Sheer RDCS (AE) Referring Phys:  [id number] Cammie Sickle Diagnosing Phys: Ida Rogue MD  Sonographer Comments: Suboptimal parasternal window and suboptimal subcostal window. Global longitudinal strain was attempted. IMPRESSIONS  1. Left ventricular ejection fraction, by estimation, is 60 to 65%. The left ventricle has normal function. The left ventricle has no regional wall motion abnormalities. Left ventricular diastolic parameters are consistent with Grade I diastolic dysfunction (impaired relaxation). The average left ventricular global longitudinal strain is -19.4 %. The global longitudinal strain is normal.  2. Right ventricular systolic function is normal. The right ventricular size is normal.  3. The mitral valve is normal in structure. Mild mitral valve regurgitation.  4. The aortic valve was not well visualized. There is moderate  calcification of the aortic valve. Aortic valve regurgitation is not visualized. Moderate aortic valve stenosis by Aortic valve mean gradient measures 25.0 mmHg. Aortic valve Vmax measures 3.57 m/s. Mild stenosis by Aortic valve area, by VTI measures 1.85 cm. FINDINGS  Left Ventricle: Left ventricular ejection fraction, by estimation, is 60 to 65%. The left ventricle has normal function. The left ventricle has no regional wall motion abnormalities. The average left ventricular global longitudinal strain is -19.4 %. The global longitudinal strain is normal. The left ventricular internal cavity size was normal in size. There is no left ventricular hypertrophy. Left ventricular diastolic parameters are consistent with Grade I diastolic dysfunction (impaired relaxation). Right Ventricle: The right ventricular size is normal. No increase in right ventricular wall thickness. Right ventricular systolic function is normal. Left Atrium: Left atrial size was normal in size. Right Atrium: Right atrial size was normal in size. Pericardium: There is no evidence of pericardial effusion. Mitral Valve: The mitral valve is normal in structure. Mild mitral valve regurgitation. No evidence of mitral valve stenosis. MV peak gradient, 4.9 mmHg. The mean mitral valve gradient is 2.0 mmHg. Tricuspid Valve: The tricuspid valve is normal in structure. Tricuspid valve regurgitation is not demonstrated. No evidence of tricuspid stenosis. Aortic Valve: The aortic valve was not well visualized. There is moderate calcification of the aortic valve. Aortic valve regurgitation is not visualized. Moderate aortic stenosis is present. Aortic valve mean gradient measures 25.0 mmHg. Aortic valve peak gradient measures 51.0 mmHg. Aortic valve area, by VTI measures 1.85 cm. Pulmonic Valve: The pulmonic valve was normal in structure. Pulmonic valve regurgitation is not visualized. No evidence of pulmonic stenosis. Aorta: The aortic root is normal in size  and structure. Venous: The inferior vena cava is normal in size with greater than 50% respiratory variability, suggesting right atrial pressure of 3 mmHg. IAS/Shunts: No atrial level shunt detected by color flow Doppler.  LEFT VENTRICLE PLAX 2D LVIDd:         3.50 cm      Diastology LVIDs:         2.50 cm      LV e' medial:    6.20 cm/s LV PW:         1.40 cm      LV E/e' medial:  13.7 LV IVS:        1.10 cm      LV e' lateral:   6.20 cm/s LVOT diam:     2.70 cm      LV E/e' lateral: 13.7 LV SV:         143 LV SV Index:   76           2D Longitudinal Strain LVOT Area:     5.73 cm     2D Strain GLS  Avg:     -19.4 %  LV Volumes (MOD) LV vol d, MOD A2C: 115.0 ml LV vol d, MOD A4C: 110.0 ml LV vol s, MOD A2C: 50.9 ml LV vol s, MOD A4C: 48.5 ml LV SV MOD A2C:     64.1 ml LV SV MOD A4C:     110.0 ml LV SV MOD BP:      62.9 ml RIGHT VENTRICLE RV Basal diam:  2.50 cm LEFT ATRIUM             Index       RIGHT ATRIUM           Index LA diam:        3.90 cm 2.06 cm/m  RA Area:     13.70 cm LA Vol (A2C):   48.1 ml 25.45 ml/m RA Volume:   31.70 ml  16.77 ml/m LA Vol (A4C):   53.8 ml 28.46 ml/m LA Biplane Vol: 51.3 ml 27.14 ml/m  AORTIC VALVE                    PULMONIC VALVE AV Area (Vmax):    1.80 cm     PV Vmax:       1.01 m/s AV Area (Vmean):   1.79 cm     PV Vmean:      71.600 cm/s AV Area (VTI):     1.85 cm     PV VTI:        0.178 m AV Vmax:           357.00 cm/s  PV Peak grad:  4.1 mmHg AV Vmean:          229.000 cm/s PV Mean grad:  2.0 mmHg AV VTI:            0.772 m AV Peak Grad:      51.0 mmHg AV Mean Grad:      25.0 mmHg LVOT Vmax:         112.00 cm/s LVOT Vmean:        71.500 cm/s LVOT VTI:          0.250 m LVOT/AV VTI ratio: 0.32  AORTA Ao Root diam: 3.10 cm MITRAL VALVE MV Area (PHT): 2.93 cm     SHUNTS MV Area VTI:   4.41 cm     Systemic VTI:  0.25 m MV Peak grad:  4.9 mmHg     Systemic Diam: 2.70 cm MV Mean grad:  2.0 mmHg MV Vmax:       1.11 m/s MV Vmean:      69.2 cm/s MV Decel Time: 259 msec MV E  velocity: 84.80 cm/s MV A velocity: 100.00 cm/s MV E/A ratio:  0.85 Ida Rogue MD Electronically signed by Ida Rogue MD Signature Date/Time: 07/18/2020/12:47:52 PM    Final    [place] CORE BIOPSY (LYMPH NODES)  Result Date: 07/02/2020 INDICATION: No known primary, now with right cervical lymphadenopathy. Please perform ultrasound-guided biopsy for tissue diagnostic purposes. EXAM: ULTRASOUND-GUIDED RIGHT CERVICAL LYMPH NODE BIOPSY COMPARISON:  Neck CT-05/29/2020 MEDICATIONS: None ANESTHESIA/SEDATION: None COMPLICATIONS: None immediate. TECHNIQUE: Informed written consent was obtained from the patient after a discussion of the risks, benefits and alternatives to treatment. Questions regarding the procedure were encouraged and answered. Initial ultrasound scanning demonstrated several enlarged right cervical lymph nodes. A dominant approximately 1.7 x 1.0 cm hypoechoic right cervical lymph node, likely correlating with the cervical lymph node seen on preceding neck CT image 79, series 2 was targeted  for biopsy given location and sonographic window. An ultrasound image was saved for documentation purposes. The procedure was planned. A timeout was performed prior to the initiation of the procedure. The operative was prepped and draped in the usual sterile fashion, and a sterile drape was applied covering the operative field. A timeout was performed prior to the initiation of the procedure. Local anesthesia was provided with 1% lidocaine with epinephrine. Under direct ultrasound guidance, an 18 gauge core needle device was utilized to obtain to obtain 8 core needle biopsies of the indeterminate right cervical lymph node. The samples were placed in saline and submitted to pathology. The needle was removed and hemostasis was achieved with manual compression. Post procedure scan was negative for significant hematoma. A dressing was placed. The patient tolerated the procedure well without immediate postprocedural  complication. IMPRESSION: Technically successful ultrasound guided biopsy of indeterminate right cervical lymph node. Electronically Signed   By: Sandi Mariscal M.D.   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 PET - STAGE IV [lymphadenopathy above and below diaphragm; involving the spleen].  Reviewed the revised IPS-high risk-60% cure with standard R-CHOP chemotherapy  # Discussed R-CHOP chemotherapy every 3 weeks x 6 cycles.  Discussed goal of therapies is cure. I would do interim scan after 2-3 cycles of chemo.  I discussed at length regarding the individual drugs in R-CHOP. Discussed the potential side effects including but not limited to-increasing fatigue, nausea vomiting, diarrhea, hair loss, sores in the mouth, increase risk of infection and also neuropathy. Also discussed regarding potential side effects of heart failure/lmalignancy with Adriamycin.  2D echo-MARCH 2022- EF 60%.   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  Stable.  No concerns for any congestive heart failure. March 2022- echo EF ~60%.   #After lengthy discussion patient seems to be reluctant with chemotherapy/given the concerns of side effects especially with his heart disease.  Discussed that less intensive therapies could be offered-however treatment would be palliative not curative.  Offered evaluation for second opinion at a tertiary center; patient declines at this time.  I spoke at length with the patient's wife/daughter & son [over the phone]- regarding the patient's clinical status/plan of care.  They will discuss amongst themselves; and get back to me regarding regarding his treatment plan.  # DISPOSITION: # folow up TBD- Dr.B  All questions were answered. The patient knows to call the clinic with any problems, questions or concerns.    Cammie Sickle,  MD 07/30/2020 7:37 AM

## 2020-07-25 NOTE — Assessment & Plan Note (Addendum)
#  Diffuse large B-cell lymphoma right neck; GCB subtype; FISH panel- QNS. PET - STAGE IV [lymphadenopathy above and below diaphragm; involving the spleen].  Reviewed the revised IPS-high risk-60% cure with standard R-CHOP chemotherapy  # Discussed R-CHOP chemotherapy every 3 weeks x 6 cycles.  Discussed goal of therapies is cure. I would do interim scan after 2-3 cycles of chemo.  I discussed at length regarding the individual drugs in R-CHOP. Discussed the potential side effects including but not limited to-increasing fatigue, nausea vomiting, diarrhea, hair loss, sores in the mouth, increase risk of infection and also neuropathy. Also discussed regarding potential side effects of heart failure/lmalignancy with Adriamycin.  2D echo-MARCH 2022- EF 60%.   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  Stable.  No concerns for any congestive heart failure. March 2022- echo EF ~60%.   #After lengthy discussion patient seems to be reluctant with chemotherapy/given the concerns of side effects especially with his heart disease.  Discussed that less intensive therapies could be offered-however treatment would be palliative not curative.  Offered evaluation for second opinion at a tertiary center; patient declines at this time.  I spoke at length with the patient's wife/daughter & son [over the phone]- regarding the patient's clinical status/plan of care.  They will discuss amongst themselves; and get back to me regarding regarding his treatment plan.  # DISPOSITION: # folow up TBD- Dr.B

## 2020-07-28 ENCOUNTER — Ambulatory Visit: Payer: Medicare Other

## 2021-10-20 ENCOUNTER — Encounter: Admission: RE | Payer: Self-pay | Source: Home / Self Care

## 2021-10-20 ENCOUNTER — Ambulatory Visit: Admission: RE | Admit: 2021-10-20 | Payer: Medicare Other | Source: Home / Self Care | Admitting: Ophthalmology

## 2021-10-20 SURGERY — PHACOEMULSIFICATION, CATARACT, WITH IOL INSERTION
Anesthesia: Topical | Laterality: Right

## 2021-12-15 ENCOUNTER — Other Ambulatory Visit: Payer: Self-pay | Admitting: Internal Medicine

## 2021-12-15 ENCOUNTER — Other Ambulatory Visit (HOSPITAL_COMMUNITY): Payer: Self-pay | Admitting: Internal Medicine

## 2021-12-15 DIAGNOSIS — Z0000 Encounter for general adult medical examination without abnormal findings: Secondary | ICD-10-CM

## 2021-12-15 DIAGNOSIS — R55 Syncope and collapse: Secondary | ICD-10-CM

## 2021-12-24 ENCOUNTER — Ambulatory Visit
Admission: RE | Admit: 2021-12-24 | Discharge: 2021-12-24 | Disposition: A | Discharge status: Home / Self Care | Payer: Medicare Other | Source: Ambulatory Visit | Attending: Internal Medicine | Admitting: Internal Medicine

## 2021-12-24 DIAGNOSIS — R55 Syncope and collapse: Secondary | ICD-10-CM | POA: Diagnosis present

## 2021-12-24 DIAGNOSIS — Z0000 Encounter for general adult medical examination without abnormal findings: Secondary | ICD-10-CM | POA: Insufficient documentation

## 2022-01-19 ENCOUNTER — Emergency Department: Payer: Medicare Other

## 2022-01-19 ENCOUNTER — Other Ambulatory Visit: Payer: Self-pay

## 2022-01-19 ENCOUNTER — Encounter: Payer: Self-pay | Admitting: *Deleted

## 2022-01-19 DIAGNOSIS — M47816 Spondylosis without myelopathy or radiculopathy, lumbar region: Secondary | ICD-10-CM | POA: Diagnosis present

## 2022-01-19 DIAGNOSIS — K5903 Drug induced constipation: Secondary | ICD-10-CM | POA: Diagnosis present

## 2022-01-19 DIAGNOSIS — I959 Hypotension, unspecified: Secondary | ICD-10-CM | POA: Diagnosis present

## 2022-01-19 DIAGNOSIS — Z720 Tobacco use: Secondary | ICD-10-CM | POA: Diagnosis present

## 2022-01-19 DIAGNOSIS — I952 Hypotension due to drugs: Secondary | ICD-10-CM | POA: Diagnosis not present

## 2022-01-19 DIAGNOSIS — I5043 Acute on chronic combined systolic (congestive) and diastolic (congestive) heart failure: Secondary | ICD-10-CM | POA: Diagnosis present

## 2022-01-19 DIAGNOSIS — I130 Hypertensive heart and chronic kidney disease with heart failure and stage 1 through stage 4 chronic kidney disease, or unspecified chronic kidney disease: Secondary | ICD-10-CM | POA: Diagnosis present

## 2022-01-19 DIAGNOSIS — I255 Ischemic cardiomyopathy: Secondary | ICD-10-CM | POA: Diagnosis present

## 2022-01-19 DIAGNOSIS — I5023 Acute on chronic systolic (congestive) heart failure: Secondary | ICD-10-CM | POA: Diagnosis not present

## 2022-01-19 DIAGNOSIS — I509 Heart failure, unspecified: Secondary | ICD-10-CM | POA: Diagnosis not present

## 2022-01-19 DIAGNOSIS — I21A1 Myocardial infarction type 2: Secondary | ICD-10-CM | POA: Diagnosis present

## 2022-01-19 DIAGNOSIS — I2510 Atherosclerotic heart disease of native coronary artery without angina pectoris: Secondary | ICD-10-CM | POA: Diagnosis present

## 2022-01-19 DIAGNOSIS — I25118 Atherosclerotic heart disease of native coronary artery with other forms of angina pectoris: Secondary | ICD-10-CM | POA: Diagnosis present

## 2022-01-19 DIAGNOSIS — Z886 Allergy status to analgesic agent status: Secondary | ICD-10-CM

## 2022-01-19 DIAGNOSIS — Z79891 Long term (current) use of opiate analgesic: Secondary | ICD-10-CM

## 2022-01-19 DIAGNOSIS — I452 Bifascicular block: Secondary | ICD-10-CM | POA: Diagnosis present

## 2022-01-19 DIAGNOSIS — I214 Non-ST elevation (NSTEMI) myocardial infarction: Secondary | ICD-10-CM

## 2022-01-19 DIAGNOSIS — I5084 End stage heart failure: Secondary | ICD-10-CM | POA: Diagnosis present

## 2022-01-19 DIAGNOSIS — Z20822 Contact with and (suspected) exposure to covid-19: Secondary | ICD-10-CM | POA: Diagnosis present

## 2022-01-19 DIAGNOSIS — R570 Cardiogenic shock: Secondary | ICD-10-CM | POA: Diagnosis not present

## 2022-01-19 DIAGNOSIS — F419 Anxiety disorder, unspecified: Secondary | ICD-10-CM | POA: Diagnosis present

## 2022-01-19 DIAGNOSIS — N182 Chronic kidney disease, stage 2 (mild): Secondary | ICD-10-CM | POA: Diagnosis present

## 2022-01-19 DIAGNOSIS — R778 Other specified abnormalities of plasma proteins: Secondary | ICD-10-CM | POA: Diagnosis present

## 2022-01-19 DIAGNOSIS — I480 Paroxysmal atrial fibrillation: Secondary | ICD-10-CM | POA: Diagnosis present

## 2022-01-19 DIAGNOSIS — G8929 Other chronic pain: Secondary | ICD-10-CM | POA: Diagnosis present

## 2022-01-19 DIAGNOSIS — Z515 Encounter for palliative care: Secondary | ICD-10-CM | POA: Diagnosis not present

## 2022-01-19 DIAGNOSIS — F32A Depression, unspecified: Secondary | ICD-10-CM | POA: Diagnosis present

## 2022-01-19 DIAGNOSIS — I739 Peripheral vascular disease, unspecified: Secondary | ICD-10-CM | POA: Diagnosis present

## 2022-01-19 DIAGNOSIS — K5909 Other constipation: Secondary | ICD-10-CM | POA: Insufficient documentation

## 2022-01-19 DIAGNOSIS — Z951 Presence of aortocoronary bypass graft: Secondary | ICD-10-CM

## 2022-01-19 DIAGNOSIS — Z8349 Family history of other endocrine, nutritional and metabolic diseases: Secondary | ICD-10-CM

## 2022-01-19 DIAGNOSIS — I5041 Acute combined systolic (congestive) and diastolic (congestive) heart failure: Secondary | ICD-10-CM | POA: Diagnosis not present

## 2022-01-19 DIAGNOSIS — N400 Enlarged prostate without lower urinary tract symptoms: Secondary | ICD-10-CM | POA: Diagnosis not present

## 2022-01-19 DIAGNOSIS — J449 Chronic obstructive pulmonary disease, unspecified: Secondary | ICD-10-CM | POA: Diagnosis present

## 2022-01-19 DIAGNOSIS — Z66 Do not resuscitate: Secondary | ICD-10-CM | POA: Diagnosis not present

## 2022-01-19 DIAGNOSIS — E440 Moderate protein-calorie malnutrition: Secondary | ICD-10-CM | POA: Diagnosis not present

## 2022-01-19 DIAGNOSIS — N179 Acute kidney failure, unspecified: Secondary | ICD-10-CM

## 2022-01-19 DIAGNOSIS — Z888 Allergy status to other drugs, medicaments and biological substances status: Secondary | ICD-10-CM

## 2022-01-19 DIAGNOSIS — Z9189 Other specified personal risk factors, not elsewhere classified: Secondary | ICD-10-CM

## 2022-01-19 DIAGNOSIS — J9601 Acute respiratory failure with hypoxia: Secondary | ICD-10-CM | POA: Diagnosis not present

## 2022-01-19 DIAGNOSIS — M545 Low back pain, unspecified: Secondary | ICD-10-CM | POA: Diagnosis not present

## 2022-01-19 DIAGNOSIS — C8591 Non-Hodgkin lymphoma, unspecified, lymph nodes of head, face, and neck: Secondary | ICD-10-CM | POA: Diagnosis present

## 2022-01-19 DIAGNOSIS — Z8261 Family history of arthritis: Secondary | ICD-10-CM

## 2022-01-19 DIAGNOSIS — I10 Essential (primary) hypertension: Secondary | ICD-10-CM | POA: Diagnosis not present

## 2022-01-19 DIAGNOSIS — R7303 Prediabetes: Secondary | ICD-10-CM | POA: Diagnosis present

## 2022-01-19 DIAGNOSIS — C8331 Diffuse large B-cell lymphoma, lymph nodes of head, face, and neck: Secondary | ICD-10-CM | POA: Diagnosis present

## 2022-01-19 DIAGNOSIS — Z880 Allergy status to penicillin: Secondary | ICD-10-CM

## 2022-01-19 DIAGNOSIS — F17210 Nicotine dependence, cigarettes, uncomplicated: Secondary | ICD-10-CM | POA: Diagnosis present

## 2022-01-19 DIAGNOSIS — M51369 Other intervertebral disc degeneration, lumbar region without mention of lumbar back pain or lower extremity pain: Secondary | ICD-10-CM | POA: Diagnosis present

## 2022-01-19 DIAGNOSIS — Z79899 Other long term (current) drug therapy: Secondary | ICD-10-CM

## 2022-01-19 DIAGNOSIS — R7989 Other specified abnormal findings of blood chemistry: Secondary | ICD-10-CM | POA: Diagnosis present

## 2022-01-19 DIAGNOSIS — M5136 Other intervertebral disc degeneration, lumbar region: Secondary | ICD-10-CM | POA: Diagnosis not present

## 2022-01-19 DIAGNOSIS — E785 Hyperlipidemia, unspecified: Secondary | ICD-10-CM | POA: Diagnosis present

## 2022-01-19 DIAGNOSIS — M549 Dorsalgia, unspecified: Secondary | ICD-10-CM | POA: Diagnosis present

## 2022-01-19 DIAGNOSIS — Z953 Presence of xenogenic heart valve: Secondary | ICD-10-CM

## 2022-01-19 DIAGNOSIS — Z8249 Family history of ischemic heart disease and other diseases of the circulatory system: Secondary | ICD-10-CM

## 2022-01-19 DIAGNOSIS — Z955 Presence of coronary angioplasty implant and graft: Secondary | ICD-10-CM

## 2022-01-19 DIAGNOSIS — Z5320 Procedure and treatment not carried out because of patient's decision for unspecified reasons: Secondary | ICD-10-CM | POA: Diagnosis not present

## 2022-01-19 DIAGNOSIS — Z825 Family history of asthma and other chronic lower respiratory diseases: Secondary | ICD-10-CM

## 2022-01-19 LAB — HEPATIC FUNCTION PANEL
ALT: 20 U/L (ref 0–44)
AST: 32 U/L (ref 15–41)
Albumin: 3.1 g/dL — ABNORMAL LOW (ref 3.5–5.0)
Alkaline Phosphatase: 105 U/L (ref 38–126)
Bilirubin, Direct: 0.3 mg/dL — ABNORMAL HIGH (ref 0.0–0.2)
Indirect Bilirubin: 0.5 mg/dL (ref 0.3–0.9)
Total Bilirubin: 0.8 mg/dL (ref 0.3–1.2)
Total Protein: 6.9 g/dL (ref 6.5–8.1)

## 2022-01-19 LAB — BASIC METABOLIC PANEL
Anion gap: 9 (ref 5–15)
BUN: 95 mg/dL — ABNORMAL HIGH (ref 8–23)
CO2: 22 mmol/L (ref 22–32)
Calcium: 8.3 mg/dL — ABNORMAL LOW (ref 8.9–10.3)
Chloride: 109 mmol/L (ref 98–111)
Creatinine, Ser: 1.98 mg/dL — ABNORMAL HIGH (ref 0.61–1.24)
GFR, Estimated: 35 mL/min — ABNORMAL LOW (ref >60)
Glucose, Bld: 173 mg/dL — ABNORMAL HIGH (ref 70–99)
Potassium: 4.9 mmol/L (ref 3.5–5.1)
Sodium: 140 mmol/L (ref 135–145)

## 2022-01-19 LAB — TROPONIN I (HIGH SENSITIVITY)
Troponin I (High Sensitivity): 641 ng/L (ref <18)
Troponin I (High Sensitivity): 679 ng/L (ref <18)

## 2022-01-19 LAB — CBC
HCT: 41.8 % (ref 39.0–52.0)
Hemoglobin: 13.3 g/dL (ref 13.0–17.0)
MCH: 30.7 pg (ref 26.0–34.0)
MCHC: 31.8 g/dL (ref 30.0–36.0)
MCV: 96.5 fL (ref 80.0–100.0)
Platelets: 149 10*3/uL — ABNORMAL LOW (ref 150–400)
RBC: 4.33 MIL/uL (ref 4.22–5.81)
RDW: 17.1 % — ABNORMAL HIGH (ref 11.5–15.5)
WBC: 8.4 10*3/uL (ref 4.0–10.5)
nRBC: 0 % (ref 0.0–0.2)

## 2022-01-19 LAB — BRAIN NATRIURETIC PEPTIDE: B Natriuretic Peptide: 4147.3 pg/mL — ABNORMAL HIGH (ref 0.0–100.0)

## 2022-01-19 LAB — TSH: TSH: 1.389 u[IU]/mL (ref 0.350–4.500)

## 2022-01-19 MED ORDER — SENNOSIDES-DOCUSATE SODIUM 8.6-50 MG PO TABS: 1.0000 | ORAL_TABLET | Freq: Every evening | ORAL | Status: DC | PRN

## 2022-01-19 MED ORDER — CITALOPRAM HYDROBROMIDE 20 MG PO TABS
20.0000 mg | ORAL_TABLET | Freq: Every day | ORAL | Status: DC
  Administered 2022-01-20 – 2022-01-21 (×2): 20 mg via ORAL
  Filled 2022-01-19 (×2): qty 1

## 2022-01-19 MED ORDER — ACETAMINOPHEN 325 MG PO TABS: 650.0000 mg | ORAL_TABLET | Freq: Four times a day (QID) | ORAL | Status: DC | PRN

## 2022-01-19 MED ORDER — GEMFIBROZIL 600 MG PO TABS
600.0000 mg | ORAL_TABLET | Freq: Two times a day (BID) | ORAL | Status: DC
  Administered 2022-01-20 – 2022-01-21 (×3): 600 mg via ORAL
  Filled 2022-01-19 (×4): qty 1

## 2022-01-19 MED ORDER — FUROSEMIDE 10 MG/ML IJ SOLN
40.0000 mg | Freq: Once | INTRAMUSCULAR | Status: AC
  Administered 2022-01-19: 40 mg via INTRAVENOUS
  Filled 2022-01-19: qty 4

## 2022-01-19 MED ORDER — ATORVASTATIN CALCIUM 20 MG PO TABS
20.0000 mg | ORAL_TABLET | Freq: Every day | ORAL | Status: DC
  Administered 2022-01-19 – 2022-01-21 (×3): 20 mg via ORAL
  Filled 2022-01-19 (×3): qty 1

## 2022-01-19 MED ORDER — BUSPIRONE HCL 10 MG PO TABS
15.0000 mg | ORAL_TABLET | Freq: Three times a day (TID) | ORAL | Status: DC
  Administered 2022-01-19 – 2022-01-21 (×5): 15 mg via ORAL
  Filled 2022-01-19: qty 3
  Filled 2022-01-19 (×4): qty 2

## 2022-01-19 MED ORDER — OXYCODONE HCL 5 MG PO TABS
10.0000 mg | ORAL_TABLET | ORAL | Status: DC | PRN
  Administered 2022-01-20 (×5): 10 mg via ORAL
  Filled 2022-01-19 (×6): qty 2

## 2022-01-19 MED ORDER — METOPROLOL TARTRATE 25 MG PO TABS
25.0000 mg | ORAL_TABLET | Freq: Two times a day (BID) | ORAL | Status: DC
  Administered 2022-01-20: 25 mg via ORAL
  Filled 2022-01-19: qty 1

## 2022-01-19 MED ORDER — DIAZEPAM 5 MG PO TABS: 5.0000 mg | ORAL_TABLET | Freq: Four times a day (QID) | ORAL | Status: DC | PRN

## 2022-01-19 MED ORDER — ONDANSETRON HCL 4 MG/2ML IJ SOLN: 4.0000 mg | Freq: Four times a day (QID) | INTRAMUSCULAR | Status: DC | PRN

## 2022-01-19 MED ORDER — ONDANSETRON HCL 4 MG PO TABS: 4.0000 mg | ORAL_TABLET | Freq: Four times a day (QID) | ORAL | Status: DC | PRN

## 2022-01-19 MED ORDER — BUTALBITAL-APAP-CAFFEINE 50-325-40 MG PO TABS
1.0000 | ORAL_TABLET | Freq: Four times a day (QID) | ORAL | Status: AC | PRN
  Administered 2022-01-20 – 2022-01-21 (×3): 1 via ORAL
  Filled 2022-01-19 (×3): qty 1

## 2022-01-19 MED ORDER — DOCUSATE SODIUM 100 MG PO CAPS
100.0000 mg | ORAL_CAPSULE | Freq: Two times a day (BID) | ORAL | Status: DC
  Administered 2022-01-19 – 2022-01-21 (×4): 100 mg via ORAL
  Filled 2022-01-19 (×4): qty 1

## 2022-01-19 MED ORDER — ACETAMINOPHEN 650 MG RE SUPP: 650.0000 mg | Freq: Four times a day (QID) | RECTAL | Status: DC | PRN

## 2022-01-19 MED ORDER — HYDRALAZINE HCL 10 MG PO TABS: 10.0000 mg | ORAL_TABLET | Freq: Four times a day (QID) | ORAL | Status: DC | PRN

## 2022-01-19 MED ORDER — FENTANYL 100 MCG/HR TD PT72: 1.0000 | MEDICATED_PATCH | TRANSDERMAL | Status: DC

## 2022-01-19 MED ORDER — NICOTINE 21 MG/24HR TD PT24
21.0000 mg | MEDICATED_PATCH | Freq: Every day | TRANSDERMAL | Status: DC
  Administered 2022-01-19 – 2022-01-21 (×3): 21 mg via TRANSDERMAL
  Filled 2022-01-19 (×3): qty 1

## 2022-01-19 MED ORDER — FENTANYL CITRATE PF 50 MCG/ML IJ SOSY
50.0000 ug | PREFILLED_SYRINGE | Freq: Once | INTRAMUSCULAR | Status: AC
  Administered 2022-01-19: 50 ug via INTRAVENOUS
  Filled 2022-01-19: qty 1

## 2022-01-19 MED ORDER — MIDODRINE HCL 5 MG PO TABS
10.0000 mg | ORAL_TABLET | Freq: Once | ORAL | Status: AC | PRN
  Administered 2022-01-20: 10 mg via ORAL
  Filled 2022-01-19: qty 2

## 2022-01-19 MED ORDER — HEPARIN SODIUM (PORCINE) 5000 UNIT/ML IJ SOLN
5000.0000 [IU] | Freq: Three times a day (TID) | INTRAMUSCULAR | Status: DC
  Administered 2022-01-19 – 2022-01-20 (×3): 5000 [IU] via SUBCUTANEOUS
  Filled 2022-01-19 (×3): qty 1

## 2022-01-19 MED ORDER — FUROSEMIDE 10 MG/ML IJ SOLN
80.0000 mg | Freq: Once | INTRAMUSCULAR | Status: DC
  Filled 2022-01-19: qty 8

## 2022-01-19 MED ORDER — ONDANSETRON HCL 4 MG/2ML IJ SOLN
4.0000 mg | Freq: Once | INTRAMUSCULAR | Status: AC
  Administered 2022-01-19: 4 mg via INTRAVENOUS
  Filled 2022-01-19: qty 2

## 2022-01-19 NOTE — ED Provider Notes (Addendum)
[facility] Provider Note    Event Date/Time   First MD Initiated Contact with Patient 12/30/2021 1712     (approximate)   History   Shortness of Breath   HPI  [name] is a 76 y.o. male with a history of hypertension, hyperlipidemia, and chronic pain on opiates who presents with shortness of breath, generalized weakness, and chest pain.  The patient and wife report shortness of breath over the last couple of months ever since the patient had a syncopal episode in the shower in June.  It is present with minimal exertion.  The patient reports intermittent chest pain over the last few weeks as well.  They also report that he has had significant edema over the last several weeks.  The patient was seen by a cardiologist yesterday and had blood work drawn.  He was started on p.o. Lasix.  His BNP was significantly elevated and so he was referred to the ED today.  I reviewed the past medical records in the cardiology note from Dr. Zigmund Daniel from yesterday.  The patient has a history of mitral valve regurgitation, CAD status post CABG, and aortic valve replacement.  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  He was noted to be significantly fluid overloaded yesterday.  Echo from July 2023 shows normal LVEF.   Physical Exam   Triage Vital Signs: ED Triage Vitals  Enc Vitals Group     BP 01/20/2022 1545 (!) 113/40     Pulse Rate 01/05/2022 1545 (!) 101     Resp 01/16/2022 1545 20     Temp 12/29/2021 1545 97.7 F (36.5 C)     Temp Source 01/18/2022 1545 Oral     SpO2 01/06/2022 1545 97 %     Weight 12/27/2021 1546 172 lb (78 kg)     Height 01/20/2022 1546 '5\' 8"'$  (1.727 m)     Head Circumference --      Peak Flow --      Pain Score 01/17/2022 1546 10     Pain Loc --      Pain Edu? --      Excl. in [place]? --     Most recent vital signs: Vitals:   12/25/2021 1935 01/22/2022 2030  BP: (!) 100/42 (!) 110/41  Pulse: 84 82  Resp: 17 18  Temp:    SpO2: 96% 91%      General: Alert and oriented, weak appearing. CV:  Good peripheral perfusion.  Normal heart sounds. Resp:  Normal effort.  Somewhat diminished breath sounds bilaterally. Abd:  No distention.  Other:  2+ bilateral lower extremity edema.   ED Results / Procedures / Treatments   Labs (all labs ordered are listed, but only abnormal results are displayed) Labs Reviewed  BASIC METABOLIC PANEL - Abnormal; Notable for the following components:      Result Value   Glucose, Bld 173 (*)    BUN 95 (*)    Creatinine, Ser 1.98 (*)    Calcium 8.3 (*)    GFR, Estimated 35 (*)    All other components within normal limits  CBC - Abnormal; Notable for the following components:   RDW 17.1 (*)    Platelets 149 (*)    All other components within normal limits  BRAIN NATRIURETIC PEPTIDE - Abnormal; Notable for the following components:   B Natriuretic Peptide 4,147.3 (*)    All other components within normal limits  HEPATIC FUNCTION PANEL - Abnormal; Notable  for the following components:   Albumin 3.1 (*)    Bilirubin, Direct 0.3 (*)    All other components within normal limits  TROPONIN I (HIGH SENSITIVITY) - Abnormal; Notable for the following components:   Troponin I (High Sensitivity) 641 (*)    All other components within normal limits  TROPONIN I (HIGH SENSITIVITY) - Abnormal; Notable for the following components:   Troponin I (High Sensitivity) 679 (*)    All other components within normal limits  SARS CORONAVIRUS 2 BY RT PCR  TSH  BASIC METABOLIC PANEL  CBC     EKG  ED ECG REPORT I, Arta Silence, the attending physician, personally viewed and interpreted this ECG.  Date: 01/04/2022 EKG Time: 1721 Rate: 97 Rhythm: normal sinus rhythm QRS Axis: normal Intervals: RBBB, LPFB ST/T Wave abnormalities: normal Narrative Interpretation: no evidence of acute ischemia    RADIOLOGY  Chest x-ray: I independently viewed and interpreted the images; there are bilateral  interstitial opacities consistent with edema   PROCEDURES:  Critical Care performed: Yes, see critical care procedure note(s)  .Critical Care  Performed by: Arta Silence, MD Authorized by: Arta Silence, MD   Critical care provider statement:    Critical care time (minutes):  30   Critical care was necessary to treat or prevent imminent or life-threatening deterioration of the following conditions:  Cardiac failure and shock   Critical care was time spent personally by me on the following activities:  Development of treatment plan with patient or surrogate, discussions with consultants, evaluation of patient's response to treatment, examination of patient, ordering and review of laboratory studies, ordering and review of radiographic studies, ordering and performing treatments and interventions, pulse oximetry, re-evaluation of patient's condition, review of old charts and obtaining history from patient or surrogate   Care discussed with: admitting provider      MEDICATIONS ORDERED IN ED: Medications  nicotine (NICODERM CQ - dosed in mg/24 hours) patch 21 mg (21 mg Transdermal Patch Applied 01/04/2022 1821)  acetaminophen (TYLENOL) tablet 650 mg (has no administration in time range)    Or  acetaminophen (TYLENOL) suppository 650 mg (has no administration in time range)  ondansetron (ZOFRAN) tablet 4 mg (has no administration in time range)    Or  ondansetron (ZOFRAN) injection 4 mg (has no administration in time range)  heparin injection 5,000 Units (has no administration in time range)  senna-docusate (Senokot-S) tablet 1 tablet (has no administration in time range)  midodrine (PROAMATINE) tablet 10 mg (has no administration in time range)  ondansetron (ZOFRAN) injection 4 mg (4 mg Intravenous Given 01/02/2022 1828)  furosemide (LASIX) injection 40 mg (40 mg Intravenous Given 01/01/2022 1944)  fentaNYL (SUBLIMAZE) injection 50 mcg (50 mcg Intravenous Given 01/09/2022 2032)      IMPRESSION / MDM / [place] / ED COURSE  I reviewed the triage vital signs and the nursing notes.  76 year old male with PMH as noted above presents with generalized weakness, edema, and shortness of breath over the last couple of months.  He was seen by cardiology yesterday and outpatient labs showed an elevated BNP so he was referred to the ED.  Differential diagnosis includes, but is not limited to, new onset CHF, ACS, fluid overload due to renal or other acute or subacute etiology.  Initial lab work-up here reveals elevated troponin at 641 and elevated creatinine of 1.98.  I ordered a BNP, hepatic panel, and TSH as well.  Presentation is consistent with fluid overload.  I have ordered  IV Lasix.  I will consult cardiology given elevated troponin.  The patient will require admission.  Patient's presentation is most consistent with acute presentation with potential threat to life or bodily function.  The patient is on the cardiac monitor to evaluate for evidence of arrhythmia and/or significant heart rate changes.  ----------------------------------------- 6:44 PM on 01/14/2022 -----------------------------------------  I consulted Dr. Saralyn Pilar from cardiology.  He does not recommend starting heparin at this time unless the patient's repeat troponin significantly increases.  The patient's blood pressure has slightly declined and MAP is below 65.  The patient is alert and appears well perfused.  He is significantly fluid overloaded so should not get IV fluids.  He does not appear to be in cardiogenic shock.  I suspect that some of this could be related to the OxyContin and Valium on board.  However, if the blood pressure remains low he may need pressor support.  We will closely monitor and reassess over the next 30 minutes.  ----------------------------------------- 9:20 PM on 01/17/2022 -----------------------------------------  Repeat troponin is essentially unchanged,  so so I do not suspect acute MI.  The blood pressure remains somewhat low.  Consulted APP [place] from the ICU.  She recommended going ahead with diuresis since the patient is clearly fluid overloaded, and then if his blood pressure does drop in response, he could get pressors although does not need them at this time.  However, since then, the patient's blood pressure has actually improved.  He remains clinically stable.  Therefore, he is appropriate for admission.  I consulted Dr. Tobie Poet from the hospitalist service; based on our discussion she agrees to admit the patient.    FINAL CLINICAL IMPRESSION(S) / ED DIAGNOSES   Final diagnoses:  Acute congestive heart failure, unspecified heart failure type ([place])  NSTEMI (non-ST elevated myocardial infarction) ([place])     Rx / DC Orders   ED Discharge Orders     None        Note:  This document was prepared using Dragon voice recognition software and may include unintentional dictation errors.    Arta Silence, MD 01/20/2022 2122    Arta Silence, MD 01/14/2022 2122

## 2022-01-19 NOTE — ED Notes (Signed)
3L [place] applied SPO2 93%. Family at bedside with Pt. Pt A&Ox4. Resting.

## 2022-01-19 NOTE — H&P (Addendum)
History and Physical   [name] RSW:[id number] DOB: 07-Sep-1945 DOA: 01/04/2022  PCP: Idelle Crouch, MD  Outpatient Specialists: Dr. Zigmund Daniel, [place] Patient coming from: Home  I have personally briefly reviewed patient's old medical records in [place].  Chief Concern: Shortness of breath  HPI: [name] is a 76 year old male with history of anxiety, depression, chronic back pain, hypertension, BPH, hyperlipidemia, nonrheumatic mitral valve regurgitation, status post AVR, CAD status post CABG (SVG-PDA, SVG-OM2), atrial fibrillation, non-Hodgkin's lymphoma, who presents emergency department for chief concerns of shortness of breath for approximately 6 weeks with intermittent chest pain.  Initial vitals in the emergency department showed temperature 97.7, respiration rate of 20, heart rate of 101, blood pressure 113/40, now 110/41, SPO2 of 91% on 3 L nasal cannula.  Serum sodium is 140, potassium 4.9, chloride of 109, bicarb 22, BUN 95, serum creatinine 1.98, GFR 35, nonfasting blood glucose 173, WBC 8.4, hemoglobin 13.3, platelets of 149.  BNP was elevated at 4147.3.  High sensitive troponin was 641 and increased to 679.  ED treatment: Fentanyl 50 mcg, furosemide 40 mg IV one-time dose, nicotine patch, ondansetron 4 mg IV  He is able to tell me his name, age, current calendar year, current location.  He reports that over the past 6 weeks he has been having worsening shortness of breath.  He states that shortness of breath is worse when he lay flat.  He also endorses approximate 20 pound weight gain with bilateral lower extremity swelling and edema.  He denies chest pain, fever, chills, abdominal pain, dysuria, hematuria, changes to his diet.  He reports exercise intolerance and inability to walk due to general weakness and dyspnea on exertion.  Fluid intake daily: Water: 34 oz per day Soda: 4-10 oz per day Milk: 16 oz per day Coffee:  18 oz per day Juice: none Tea: none Etoh: none Soup: none  Social history: He lives at home with his wife.  He is a current tobacco user, landing his cigarettes per day, consuming about 1 pack/day.  He denies EtOH and recreational drug use.  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  ROS: Constitutional: + weight change, no fever ENT/Mouth: no sore throat, no rhinorrhea Eyes: no eye pain, no vision changes Cardiovascular: no chest pain, + dyspnea,  + edema, no palpitations Respiratory: no cough, no sputum, no wheezing Gastrointestinal: no nausea, no vomiting, no diarrhea, no constipation Genitourinary: no urinary incontinence, no dysuria, no hematuria Musculoskeletal: no arthralgias, no myalgias Skin: no skin lesions, no pruritus, Neuro: + weakness, no loss of consciousness, no syncope Psych: no anxiety, no depression, no decrease appetite Heme/Lymph: no bruising, no bleeding  ED Course: Discussed with emergency medicine provider, patient requiring hospitalization for chief concerns of new onset heart failure with exacerbation.  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)   Tobacco abuse   Essential hypertension   Elevated troponin   Chronic back pain   Anxiety and depression   Polypharmacy   At risk for constipation   BPH (benign prostatic hyperplasia)   Assessment and Plan:  * Acute exacerbation of CHF (congestive heart failure) (HCC) - Strict I's and O's - Complete echo ordered - Admit to stepdown, inpatient  Hypotension - With latest MAP of 62 - Complicated by polypharmacy and new heart failure with exacerbation - EDP consulted ICU who recommends to continue diuresis and no indication for pressor support  at this time - Holding home lisinopril 10 mg daily and furosemide 40 mg daily due to hypotension on admission - We will admit to stepdown, inpatient - Midodrine 10 mg p.o. as needed for MAP less than 60  BPH (benign prostatic hyperplasia) - Holding home tamsulosin due to hypotension  At risk for constipation - As needed docusate sodium 100 mg p.o. has been changed to 100 mg p.o. twice daily - Senna docusate nightly as needed for mild constipation  Anxiety and depression - Patient takes buspirone 15 mg 3 times daily, Celexa 20 mg daily  Chronic back pain - Patient takes oxycodone 10 mg every 4 hours as needed for moderate pain, fentanyl 100 mcg transdermal patch every other day, these have been resumed  Elevated troponin - Low clinical suspicion for ACS - Suspect secondary to demand ischemia in setting of new heart failure with exacerbation as patient denies chest pain and there is no ST/ischemic changes on EKG - EDP consulted Dr. Saralyn Pilar, [facility] cardiology who recommends against initiating heparin given that high sensitive troponin delta is negative for markedly elevated  Essential hypertension - I will hold lisinopril 10 mg daily and metoprolol 25 mg daily at this time due to hypotension on admission - Hydralazine 10 mg every 6 hours as needed for SBP greater than 180, 4 days ordered  Tobacco abuse - Nicotine patch ordered by EDP  CAD (coronary artery disease) - Patient takes aspirin 81 mg, atorvastatin 20 mg daily, lisinopril 10 mg in the morning and 20 mg in the evening to total 30 mg daily, metoprolol tartrate 725 mg p.o. twice daily  Chart reviewed.   DVT prophylaxis: Heparin 5000 units subcutaneous every 8 hours Code Status: full Diet: Heart healthy Family Communication: Updated spouse and son at bedside with patient's permission Disposition Plan: Pending clinical course Consults called: Cardiology Admission status: Stepdown, inpatient  Past Medical  History:  Diagnosis Date   Aortic stenosis    CAD (coronary artery disease)    Cervicalgia    CHF (congestive heart failure) (HCC)    Chronic bilateral low back pain with right-sided sciatica    COPD (chronic obstructive pulmonary disease) (HCC)    DDD (degenerative disc disease), lumbar    Hyperlipidemia    Hypertension    Large B-cell lymphoma (HCC)    Pain syndrome, chronic    Pre-diabetes    PVD (peripheral vascular disease) (HCC)    Spondylosis, lumbar, with myelopathy    Tobacco abuse    Past Surgical History:  Procedure Laterality Date   cardiac stents     CORONARY ANGIOPLASTY     CORONARY ARTERY BYPASS GRAFT     VSD REPAIR     Social History:  reports that he has been smoking cigarettes. He has a 40.00 pack-year smoking history. He has never used smokeless tobacco. He reports that he does not drink alcohol and does not use drugs.  Allergies  Allergen Reactions   Celebrex [Celecoxib] Other (See Comments) and Shortness Of Breath     CHEST PAIN Chest pain   Carvedilol Other (See Comments)    Extreme fatigue   Penicillins Other (See Comments) and Rash    unknown   Family History  Problem Relation Age of Onset   Arthritis Mother    Heart disease Mother  died During open heart surgery   Early death Father    Heart disease Father        Coronary Artery Disease    Asthma Son    Thyroid disease Sister    Family history: Family history reviewed and not pertinent.  Prior to Admission medications   Medication Sig Start Date End Date Taking? Authorizing Provider  aspirin 81 MG chewable tablet Chew by mouth daily.    [provider]  atorvastatin (LIPITOR) 20 MG tablet Take 20 mg by mouth daily.  05/30/15   [provider]  busPIRone (BUSPAR) 15 MG tablet Take 15 mg by mouth 3 (three) times daily.  05/30/15   [provider]  butalbital-aspirin-caffeine Acquanetta Chain) 50-325-40 MG capsule Take 1 capsule by mouth every 6 (six) hours as needed  for headache.     [provider]  citalopram (CELEXA) 20 MG tablet Take 20 mg by mouth daily.    [provider]  diazepam (VALIUM) 5 MG tablet One pill 45-60 mins prior to scan; 1 pill 15 mins prior if needed. Patient not taking: Reported on 07/25/2020 07/17/20   Cammie Sickle, MD  docusate sodium (COLACE) 100 MG capsule Take 100 mg by mouth 3 (three) times daily.    [provider]  fentaNYL (DURAGESIC) 100 MCG/HR 1 patch every other day. 06/18/20   [provider]  gemfibrozil (LOPID) 600 MG tablet Take 600 mg by mouth 2 (two) times daily before a meal.    [provider]  lidocaine (XYLOCAINE) 5 % ointment as directed.  11/13/15   [provider]  lisinopril (PRINIVIL,ZESTRIL) 10 MG tablet Take 30 mg by mouth daily. (take 10 mg in the am and 20 mg in the evening to equal 30 mg daily)    [provider]  metoprolol tartrate (LOPRESSOR) 25 MG tablet Take 25 mg by mouth 2 (two) times daily.    [provider]  tamsulosin (FLOMAX) 0.4 MG CAPS capsule Take 0.4 mg by mouth daily.    [provider]   Physical Exam: Vitals:   01/14/2022 1800 12/25/2021 1830 01/03/2022 1935 12/28/2021 2030  BP: (!) 93/40 (!) 95/39 (!) 100/42 (!) 110/41  Pulse: 82 81 84 82  Resp: (!) 31 (!) '22 17 18  '$ Temp:      TempSrc:      SpO2: 93% 90% 96% 91%  Weight:      Height:       Constitutional: appears age-appropriate, frail, NAD, calm, comfortable Eyes: PERRL, lids and conjunctivae normal ENMT: Mucous membranes are moist. Posterior pharynx clear of any exudate or lesions. Age-appropriate dentition.  Mild to moderate bilateral hearing loss Neck: normal, supple, no masses, no thyromegaly Respiratory: clear to auscultation bilaterally, no wheezing, no crackles. Normal respiratory effort. No accessory muscle use.  Cardiovascular: Regular rate and rhythm, + murmurs.  No rubs / gallops.  Bilateral lower extremity edema, 2+ pitting. 2+ pedal  pulses. No carotid bruits.  Abdomen: no tenderness, no masses palpated, no hepatosplenomegaly. Bowel sounds positive.  Musculoskeletal: no clubbing / cyanosis. No joint deformity upper and lower extremities. Good ROM, no contractures, no atrophy. Normal muscle tone.  Skin: no rashes, lesions, ulcers. No induration Neurologic: Sensation intact. Strength 5/5 in all 4.  Psychiatric: Normal judgment and insight. Alert and oriented x 3. Normal mood.   EKG: independently reviewed, showing sinus rhythm with rate of 97, QTc 514  Chest x-ray on Admission: I personally reviewed and I agree with radiologist reading as  below.  DG Chest 2 View  Result Date: 01/16/2022 CLINICAL DATA:  Shortness of breath, intermittent chest pain and weakness for 6 weeks. EXAM: CHEST - 2 VIEW COMPARISON:  None available FINDINGS: Previous median sternotomy and CABG procedure. Heart size appears normal. Aortic atherosclerotic calcifications. There is a small right pleural effusion. Diffuse increase interstitial markings are identified throughout both lungs. There is platelike atelectasis in the right base. No airspace consolidation. IMPRESSION: Small right pleural effusion and diffuse increased interstitial markings compatible with congestive heart failure. Electronically Signed   By: Kerby Moors M.D.   On: 01/12/2022 16:24    Labs on Admission: I have personally reviewed following labs  CBC: Recent Labs  Lab 01/14/2022 1550  WBC 8.4  HGB 13.3  HCT 41.8  MCV 96.5  PLT 290*   Basic Metabolic Panel: Recent Labs  Lab 01/16/2022 1550  NA 140  K 4.9  CL 109  CO2 22  GLUCOSE 173*  BUN 95*  CREATININE 1.98*  CALCIUM 8.3*   GFR: Estimated Creatinine Clearance: 31.7 mL/min (A) (by C-G formula based on SCr of 1.98 mg/dL (H)).  Liver Function Tests: Recent Labs  Lab 01/07/2022 1820  AST 32  ALT 20  ALKPHOS 105  BILITOT 0.8  PROT 6.9  ALBUMIN 3.1*   Thyroid Function Tests: Recent Labs    01/10/2022 1820  TSH  1.389   Dr. Tobie Poet [facility]  If 7PM-7AM, please contact overnight-coverage provider If 7AM-7PM, please contact day coverage provider [email or web address]  01/05/2022, 10:47 PM

## 2022-01-19 NOTE — ED Notes (Signed)
Low BP, DC Lasix per MD

## 2022-01-19 NOTE — Assessment & Plan Note (Addendum)
-   trop 600's flat. -patient denies chest pain and there is no ST/ischemic changes on EKG - EDP consulted Dr. Saralyn Pilar, no need for heparin gtt.

## 2022-01-19 NOTE — Assessment & Plan Note (Addendum)
--  not currently active.

## 2022-01-19 NOTE — Assessment & Plan Note (Signed)
--  due to large amount of home opioids use --aggressive Miralax

## 2022-01-19 NOTE — ED Triage Notes (Signed)
Pt reports sob  Sx for 6 weeks.  Intermittent chest pain.  Pt saw dr yesterday.  Pt reports weakness.  Pt alert  speech clear.

## 2022-01-19 NOTE — Assessment & Plan Note (Signed)
-   Holding home tamsulosin due to hypotension

## 2022-01-19 NOTE — Assessment & Plan Note (Signed)
--  cont statin

## 2022-01-19 NOTE — Assessment & Plan Note (Addendum)
-   Patient takes buspirone 15 mg 3 times daily, Celexa 20 mg daily, and Valium  --cont home regimen

## 2022-01-19 NOTE — Assessment & Plan Note (Addendum)
--  BP trending down to 80's --start Levo gtt

## 2022-01-19 NOTE — Hospital Course (Signed)
[name] is a 76 year old male with history of anxiety, depression, chronic back pain, hypertension, BPH, hyperlipidemia, nonrheumatic mitral valve regurgitation, status post AVR, CAD status post CABG (SVG-PDA, SVG-OM2), atrial fibrillation, non-Hodgkin's lymphoma, who presents emergency department for chief concerns of shortness of breath for approximately 6 weeks with intermittent chest pain.  Initial vitals in the emergency department showed temperature 97.7, respiration rate of 20, heart rate of 101, blood pressure 113/40, now 110/41, SPO2 of 91% on 3 L nasal cannula.  Serum sodium is 140, potassium 4.9, chloride of 109, bicarb 22, BUN 95, serum creatinine 1.98, GFR 35, nonfasting blood glucose 173, WBC 8.4, hemoglobin 13.3, platelets of 149.  BNP was elevated at 4147.3.  High sensitive troponin was 641 and increased to 679.  ED treatment: Fentanyl 50 mcg, furosemide 40 mg IV one-time dose, nicotine patch, ondansetron 4 mg IV

## 2022-01-19 NOTE — Assessment & Plan Note (Signed)
-   Patient takes oxycodone 10 mg every 4 hours as needed for moderate pain, fentanyl 100 mcg transdermal patch every other day, these have been resumed --reduce oxycodone to 5 mg q4h PRN due to hypotension

## 2022-01-19 NOTE — Assessment & Plan Note (Signed)
-   Nicotine patch ordered by EDP

## 2022-01-19 NOTE — Assessment & Plan Note (Signed)
--  current Echo showed new reduced LVEF 35 to 40% with regional wall motion abnormalities. Plan: --cardiology consult --diuresis per cardio

## 2022-01-20 ENCOUNTER — Other Ambulatory Visit: Payer: Self-pay

## 2022-01-20 ENCOUNTER — Inpatient Hospital Stay
Admit: 2022-01-20 | Discharge: 2022-01-20 | Disposition: A | Discharge status: Home / Self Care | Payer: Medicare Other | Attending: Internal Medicine | Admitting: Internal Medicine

## 2022-01-20 DIAGNOSIS — I5041 Acute combined systolic (congestive) and diastolic (congestive) heart failure: Secondary | ICD-10-CM | POA: Diagnosis not present

## 2022-01-20 DIAGNOSIS — J9601 Acute respiratory failure with hypoxia: Secondary | ICD-10-CM

## 2022-01-20 LAB — MRSA NEXT GEN BY PCR, NASAL: MRSA by PCR Next Gen: NOT DETECTED

## 2022-01-20 LAB — BASIC METABOLIC PANEL
Anion gap: 8 (ref 5–15)
BUN: 86 mg/dL — ABNORMAL HIGH (ref 8–23)
CO2: 23 mmol/L (ref 22–32)
Calcium: 8 mg/dL — ABNORMAL LOW (ref 8.9–10.3)
Chloride: 110 mmol/L (ref 98–111)
Creatinine, Ser: 1.77 mg/dL — ABNORMAL HIGH (ref 0.61–1.24)
GFR, Estimated: 40 mL/min — ABNORMAL LOW (ref >60)
Glucose, Bld: 101 mg/dL — ABNORMAL HIGH (ref 70–99)
Potassium: 4.6 mmol/L (ref 3.5–5.1)
Sodium: 141 mmol/L (ref 135–145)

## 2022-01-20 LAB — GLUCOSE, CAPILLARY: Glucose-Capillary: 106 mg/dL — ABNORMAL HIGH (ref 70–99)

## 2022-01-20 LAB — ECHOCARDIOGRAM COMPLETE
AR max vel: 1.52 cm2
AV Area VTI: 1.81 cm2
AV Area mean vel: 1.58 cm2
AV Mean grad: 13.5 mmHg
AV Peak grad: 24.5 mmHg
Ao pk vel: 2.48 m/s
Area-P 1/2: 6.27 cm2
Height: 68 in
S' Lateral: 3.9 cm
Weight: 2913.6 oz

## 2022-01-20 LAB — TROPONIN I (HIGH SENSITIVITY): Troponin I (High Sensitivity): 840 ng/L (ref <18)

## 2022-01-20 LAB — CBC
HCT: 40.2 % (ref 39.0–52.0)
Hemoglobin: 13.2 g/dL (ref 13.0–17.0)
MCH: 31.4 pg (ref 26.0–34.0)
MCHC: 32.8 g/dL (ref 30.0–36.0)
MCV: 95.7 fL (ref 80.0–100.0)
Platelets: 119 10*3/uL — ABNORMAL LOW (ref 150–400)
RBC: 4.2 MIL/uL — ABNORMAL LOW (ref 4.22–5.81)
RDW: 17.2 % — ABNORMAL HIGH (ref 11.5–15.5)
WBC: 9.3 10*3/uL (ref 4.0–10.5)
nRBC: 0.2 % (ref 0.0–0.2)

## 2022-01-20 LAB — SARS CORONAVIRUS 2 BY RT PCR: SARS Coronavirus 2 by RT PCR: NEGATIVE

## 2022-01-20 MED ORDER — FUROSEMIDE 10 MG/ML IJ SOLN
INTRAMUSCULAR | Status: AC
  Administered 2022-01-20: 20 mg via INTRAVENOUS
  Filled 2022-01-20: qty 2

## 2022-01-20 MED ORDER — AMIODARONE HCL IN DEXTROSE 360-4.14 MG/200ML-% IV SOLN
INTRAVENOUS | Status: AC
  Administered 2022-01-20: 150 mg via INTRAVENOUS
  Filled 2022-01-20: qty 200

## 2022-01-20 MED ORDER — FUROSEMIDE 10 MG/ML IJ SOLN
20.0000 mg | Freq: Once | INTRAMUSCULAR | Status: AC
  Administered 2022-01-20: 20 mg via INTRAVENOUS
  Filled 2022-01-20: qty 2

## 2022-01-20 MED ORDER — NITROGLYCERIN 0.4 MG SL SUBL
SUBLINGUAL_TABLET | SUBLINGUAL | Status: AC
  Filled 2022-01-20: qty 1

## 2022-01-20 MED ORDER — AMIODARONE HCL IN DEXTROSE 360-4.14 MG/200ML-% IV SOLN
60.0000 mg/h | INTRAVENOUS | Status: DC
  Administered 2022-01-20 – 2022-01-21 (×2): 60 mg/h via INTRAVENOUS
  Filled 2022-01-20: qty 200

## 2022-01-20 MED ORDER — MIDODRINE HCL 5 MG PO TABS
10.0000 mg | ORAL_TABLET | Freq: Once | ORAL | Status: AC | PRN
  Administered 2022-01-20: 10 mg via ORAL
  Filled 2022-01-20: qty 2

## 2022-01-20 MED ORDER — DIAZEPAM 5 MG PO TABS
5.0000 mg | ORAL_TABLET | Freq: Once | ORAL | Status: AC
  Administered 2022-01-20: 5 mg via ORAL
  Filled 2022-01-20: qty 1

## 2022-01-20 MED ORDER — POLYETHYLENE GLYCOL 3350 17 G PO PACK
34.0000 g | PACK | ORAL | Status: AC
  Administered 2022-01-20: 34 g via ORAL
  Filled 2022-01-20: qty 2

## 2022-01-20 MED ORDER — ORAL CARE MOUTH RINSE: 15.0000 mL | OROMUCOSAL | Status: DC | PRN

## 2022-01-20 MED ORDER — HEPARIN (PORCINE) 25000 UT/250ML-% IV SOLN
1400.0000 [IU]/h | INTRAVENOUS | Status: DC
  Administered 2022-01-21: 1000 [IU]/h via INTRAVENOUS
  Administered 2022-01-23: 1400 [IU]/h via INTRAVENOUS
  Filled 2022-01-20 (×4): qty 250

## 2022-01-20 MED ORDER — HEPARIN BOLUS VIA INFUSION
4000.0000 [IU] | Freq: Once | INTRAVENOUS | Status: AC
  Administered 2022-01-20: 4000 [IU] via INTRAVENOUS
  Filled 2022-01-20: qty 4000

## 2022-01-20 MED ORDER — ENOXAPARIN SODIUM 40 MG/0.4ML IJ SOSY: 40.0000 mg | PREFILLED_SYRINGE | INTRAMUSCULAR | Status: DC

## 2022-01-20 MED ORDER — DIAZEPAM 5 MG PO TABS
5.0000 mg | ORAL_TABLET | Freq: Four times a day (QID) | ORAL | Status: DC | PRN
  Administered 2022-01-20 – 2022-01-21 (×3): 5 mg via ORAL
  Filled 2022-01-20 (×3): qty 1

## 2022-01-20 MED ORDER — SODIUM CHLORIDE 0.9% FLUSH
3.0000 mL | Freq: Two times a day (BID) | INTRAVENOUS | Status: DC
  Administered 2022-01-20 – 2022-01-23 (×6): 3 mL via INTRAVENOUS

## 2022-01-20 MED ORDER — AMIODARONE HCL IN DEXTROSE 360-4.14 MG/200ML-% IV SOLN
30.0000 mg/h | INTRAVENOUS | Status: DC
  Administered 2022-01-21: 30 mg/h via INTRAVENOUS

## 2022-01-20 MED ORDER — CHLORHEXIDINE GLUCONATE CLOTH 2 % EX PADS
6.0000 | MEDICATED_PAD | Freq: Every day | CUTANEOUS | Status: DC
  Administered 2022-01-20 – 2022-01-22 (×3): 6 via TOPICAL

## 2022-01-20 MED ORDER — FUROSEMIDE 10 MG/ML IJ SOLN: 20.0000 mg | Freq: Once | INTRAMUSCULAR | Status: AC

## 2022-01-20 MED ORDER — AMIODARONE IV BOLUS ONLY 150 MG/100ML
INTRAVENOUS | Status: AC
  Filled 2022-01-20: qty 100

## 2022-01-20 MED ORDER — NITROGLYCERIN 0.4 MG SL SUBL
0.4000 mg | SUBLINGUAL_TABLET | SUBLINGUAL | Status: DC | PRN
  Administered 2022-01-21 (×3): 0.4 mg via SUBLINGUAL
  Filled 2022-01-20 (×3): qty 1

## 2022-01-20 MED ORDER — NITROGLYCERIN 2 % TD OINT: 0.5000 [in_us] | TOPICAL_OINTMENT | Freq: Four times a day (QID) | TRANSDERMAL | Status: DC

## 2022-01-20 MED ORDER — AMIODARONE LOAD VIA INFUSION
150.0000 mg | Freq: Once | INTRAVENOUS | Status: AC
  Filled 2022-01-20: qty 83.34

## 2022-01-20 NOTE — Assessment & Plan Note (Signed)
2/2 volume overload resulting in pulm edema and pleural effusion --currently on heated hf --cont diuresis

## 2022-01-20 NOTE — Consult Note (Signed)
ANTICOAGULATION CONSULT NOTE - Initial Consult  Pharmacy Consult for IV heparin infusion Indication: chest pain/ACS  Allergies  Allergen Reactions   Celebrex [Celecoxib] Other (See Comments) and Shortness Of Breath     CHEST PAIN Chest pain   Carvedilol Other (See Comments)    Extreme fatigue   Penicillins Other (See Comments) and Rash    unknown    Patient Measurements: Height: '5\' 8"'$  (172.7 cm) Weight: 82.6 kg (182 lb 1.6 oz) IBW/kg (Calculated) : 68.4 Heparin Dosing Weight: 82.6 kg   Vital Signs: Temp: 97.9 F (36.6 C) (09/27 1600) Temp Source: Oral (09/27 1600) BP: 113/67 (09/27 1900) Pulse Rate: 112 (09/27 2030)  Labs: Recent Labs    01/08/2022 1550 01/12/2022 1820 01/20/22 0642 01/20/22 1841  HGB 13.3  --  13.2  --   HCT 41.8  --  40.2  --   PLT 149*  --  119*  --   CREATININE 1.98*  --  1.77*  --   TROPONINIHS 641* 679*  --  840*    Estimated Creatinine Clearance: 38.4 mL/min (A) (by C-G formula based on SCr of 1.77 mg/dL (H)).   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-sided sciatica    COPD (chronic obstructive pulmonary disease) (HCC)    DDD (degenerative disc disease), lumbar    Hyperlipidemia    Hypertension    Large B-cell lymphoma (HCC)    Pain syndrome, chronic    Pre-diabetes    PVD (peripheral vascular disease) (HCC)    Spondylosis, lumbar, with myelopathy    Tobacco abuse     Medications:  Heparin 5000 units SQ - last dose 9/27 1346  Assessment: 76 year old male with history of anxiety, hypertension, hyperlipidemia, nonrheumatic mitral valve regurgitation, status post AVR, CAD status post CABG, atrial fibrillation, presented to ED 01/04/2022 for chief concerns of shortness of breath for approximately 6 weeks with intermittent chest pain  Goal of Therapy:  Heparin level  0.3-0.7 units/ml Monitor platelets by anticoagulation protocol: Yes   Plan:  Give 4000 units bolus x 1 Start heparin infusion at 1000 units/hr Check anti-Xa level in 8 hours  Continue to monitor H&H and platelets  Dorothe Pea, PharmD, BCPS Clinical Pharmacist   01/20/2022,9:20 PM

## 2022-01-20 NOTE — Progress Notes (Signed)
Nutrition Brief Note  RD pulled to chart secondary to CHF diagnosis.   Wt Readings from Last 15 Encounters:  01/20/22 82.6 kg  07/25/20 76.1 kg  07/15/20 75.5 kg  06/07/18 79.4 kg  04/12/18 78.9 kg  02/13/18 78 kg  12/22/17 78.9 kg  12/15/17 78.9 kg  12/01/17 78.9 kg  10/12/17 79.4 kg  09/26/17 79.4 kg  08/08/17 78.9 kg  06/14/17 80.3 kg  04/22/17 80.3 kg  02/21/17 80.3 kg   Pt with history of anxiety, depression, chronic back pain, hypertension, BPH, hyperlipidemia, nonrheumatic mitral valve regurgitation, status post AVR, CAD status post CABG (SVG-PDA, SVG-OM2), atrial fibrillation, non-Hodgkin's lymphoma, who presents for chief concerns of shortness of breath for approximately 6 weeks with intermittent chest pain.  Pt admitted with CHF exacerbation.   RD provided "Low Sodium Nutrition Therapy" handout from [facility] Nutrition Care Manual; attached to AVS/ discharge summary.   Current diet order is Heart Healthy (liberalized to 2 gram Na), patient is consuming approximately n/a% of meals at this time. Labs and medications reviewed.   No nutrition interventions warranted at this time. If nutrition issues arise, please consult RD.   Loistine Chance, RD, LDN, [place] Registered Dietitian II Certified Diabetes Care and Education Specialist Please refer to [facility] for RD and/or RD on-call/weekend/after hours pager

## 2022-01-20 NOTE — Progress Notes (Signed)
       CROSS COVER NOTE  NAME: [name] MRN: [id number] DOB : 1946/01/13    Date of Service   01/20/2022   HPI/Events of Note   Message received from staff for patient request to take home supply of Valium. PDMP checked. Mr Arata takes 5 mg of Valium QID. Staff confirmed that patient no longer has home Valium in his possession and meds were given to wife to take home.  Interventions   Assessment/Plan: Home Valium x1     This document was prepared using Dragon voice recognition software and may include unintentional dictation errors.  Neomia Glass DNP, MHA, FNP-BC Nurse Practitioner [facility] Pager [phone]

## 2022-01-20 NOTE — Progress Notes (Signed)
  PROGRESS NOTE    [name]  GNF:[id number] DOB: 04-10-1946 DOA: 01/04/2022 PCP: Idelle Crouch, MD  [id number]/[id number]-AA  LOS: 1 day   Brief hospital course:   Assessment & Plan: [name] is a 76 year old male with history of anxiety, depression, chronic back pain, hypertension, BPH, hyperlipidemia, nonrheumatic mitral valve regurgitation, status post AVR, CAD status post CABG (SVG-PDA, SVG-OM2), atrial fibrillation, non-Hodgkin's lymphoma, who presents emergency department for chief concerns of shortness of breath for approximately 6 weeks with intermittent chest pain.     * Acute combined systolic and diastolic congestive heart failure (HCC) --current Echo showed new reduced LVEF 35 to 40% with regional wall motion abnormalities. Plan: --cardiology consult --diuresis per cardio  Acute hypoxemic respiratory failure (HCC) --intermittently desat to 87-89% on continuous pulse ox.  Currently on 3L. --2/2 interstitial edema. --Continue supplemental O2 to keep sats >=90%, wean as tolerated   Hypotension - MAP low, mostly due to low diastolic pressure.  Systolic mostly >846'N.  BPH (benign prostatic hyperplasia) - Holding home tamsulosin due to hypotension  Chronic constipation --due to large amount of home opioids use --aggressive Miralax  Anxiety and depression - Patient takes buspirone 15 mg 3 times daily, Celexa 20 mg daily, and Valium  --cont home regimen  Chronic back pain - Patient takes oxycodone 10 mg every 4 hours as needed for moderate pain, fentanyl 100 mcg transdermal patch every other day, these have been resumed  Elevated troponin - trop 600's flat. -patient denies chest pain and there is no ST/ischemic changes on EKG - EDP consulted Dr. Saralyn Pilar, no need for heparin gtt.  Essential hypertension --not currently active.  Tobacco abuse - Nicotine patch ordered by EDP  CAD (coronary artery disease) --cont statin   DVT prophylaxis:  Lovenox SQ Code Status: Full code  Family Communication: wife updated at bedside today Level of care: Telemetry Cardiac Dispo:   The patient is from: home Anticipated d/c is to: home Anticipated d/c date is: 2-3 days   Subjective and Interval History:  Pt was having difficulty with BM.    Echo showed new reduced LVEF 35 to 40% with regional wall motion abnormalities.   Objective: Vitals:   01/20/22 0743 01/20/22 0800 01/20/22 1200 01/20/22 1400  BP: (!) 119/36 (!) 117/38 (!) 126/34 (!) 117/47  Pulse: (!) 104 (!) 102 (!) 108 97  Resp: 17 18 (!) 22 (!) 21  Temp:  97.9 F (36.6 C)    TempSrc:  Oral    SpO2: 90% 92% 90% (!) 88%  Weight:      Height:        Intake/Output Summary (Last 24 hours) at 01/20/2022 1824 Last data filed at 01/20/2022 0100 Gross per 24 hour  Intake --  Output 1400 ml  Net -1400 ml   Filed Weights   01/03/2022 1546 01/20/22 0217  Weight: 78 kg 82.6 kg    Examination:   Constitutional: NAD, AAOx3, sitting on bedside commode HEENT: conjunctivae and lids normal, EOMI CV: No cyanosis.   RESP: normal respiratory effort, on RA Extremities: edema in BLE SKIN: warm, dry Neuro: II - XII grossly intact.   Psych: depressed mood and affect.     Data Reviewed: I have personally reviewed labs and imaging studies  Time spent: 50 minutes  Enzo Bi, MD [facility] If 7PM-7AM, please contact night-coverage 01/20/2022, 6:24 PM

## 2022-01-20 NOTE — Discharge Instructions (Signed)

## 2022-01-20 NOTE — Progress Notes (Signed)
*  PRELIMINARY RESULTS* Echocardiogram 2D Echocardiogram has been performed.  [name] [name] 01/20/2022, 10:31 AM

## 2022-01-20 NOTE — Progress Notes (Signed)
Echo final results suggestive of ischemic cardiomyopathy. Dr. Clayborn Bigness is at the bedside, along with this NP to discuss findings and possible cardiac catheterization procedure.

## 2022-01-20 NOTE — Consult Note (Addendum)
Patient was seen evaluated and examined by me and the PA on 01/20/22.  Course of action, evaluation, and management decisions were developed solely by me, but detailed below in the PA's note.   Impression Acute Systolic Heart Failure Hypotension Acute renal  insuff ICM ant/apical/lat/septal hypo Multivessel CAD Possible NSTEMI Smoking  Plan Defer cath until stable Rec nephology Bp support Heart failure diuresis Rec cardiac when stable    CARDIOLOGY CONSULT NOTE               Patient ID: [name] MRN: [id number] DOB/AGE: [birth year] 76 y.o.  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   On 01/18/22, the patient was seen by his primary cardiologist due to c/o a >10 pound weight gain, dyspnea and BLE edema. Per provider note: The patient was noted to be borderline hypotensive at 101/39 with an elevated HR of 104 bpm. He was started on lasix 40 mg daily, lisinopril was hled and metoprolol tartrate was decreased to 12.'5mg'$  twice daily. His proBNP was taken at that time and noted to be significantly elevated at >30k and the creatinine was elevated at 2.1. He was instructed to go the nearest ER for further evaluation.   On 12/27/2021 he presented to [facility] ER due to c/o dyspnea, generalized weakness and chest pain. Per ER provider note: the  patient was tachycardic at 101 bpm, his BP was 113/40, he was afebrile; Ecg revealed SR with a bifascicular block and a HR of 100 bpm, poor R wave progression and no acute ischemic changes; labs were remarkable for elevated BNP >4,000, HS troponin 641>>679, creatinine of 1.98 and BUN of 95. CXR revealed a small right pleural effusion and diffuse increased interstitial markings compatible with congestive heart failure.  Cardiology was consulted; heparin was not recommended at that time. He was treated with IV lasix '40mg'$  once and fentanyl. He became hypotensive thus the IV lasix was discontinued and the patient was admitted to the Cardiac progressive unit.   The patient is not feeling well at this time and continues to c/o dyspnea and BLE edema. His wife and daughter are at the bedside and also contributed to the HPI. The patient's symptoms of dyspnea began on labor day 9/4 after he experienced a syncope episode in the shower. After that day, he began to notice the worsening dyspnea, weight gain and BLE edema. He is not having any chest pain at this time, but was previously experiencing some discomfort while he was in the ER.  He does have occasional palpitations, but he denies having any further episodes of dizziness or syncope recently. He was compliant with his outpatient medications and had been tolerating the oral lasix well. The patient is not resting comfortably in bed at this time due to his chronic back pain.    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-sided sciatica    COPD (chronic obstructive pulmonary disease) (HCC)    DDD (degenerative disc disease), lumbar    Hyperlipidemia    Hypertension    Large B-cell lymphoma (HCC)    Pain syndrome, chronic    Pre-diabetes     PVD (peripheral vascular disease) (HCC)    Spondylosis, lumbar, with myelopathy    Tobacco abuse     Past Surgical History:  Procedure Laterality Date   cardiac stents     CORONARY ANGIOPLASTY     CORONARY ARTERY BYPASS GRAFT     VSD REPAIR      Medications Prior to Admission  Medication Sig Dispense Refill Last Dose   atorvastatin (LIPITOR) 20 MG tablet Take 20 mg by mouth daily.    01/18/2022 at 1800   busPIRone (BUSPAR) 15 MG tablet Take 15 mg by mouth 3 (three) times daily.    12/25/2021 at 0800   citalopram (CELEXA) 20 MG tablet Take 20 mg by mouth daily.   01/07/2022 at 0800   diazepam (VALIUM) 5 MG tablet One pill 45-60 mins prior to scan; 1 pill 15 mins prior if needed. (Patient taking differently: Take 5 mg by mouth every 6 (six) hours as needed. One pill 45-60 mins prior to scan; 1 pill 15 mins prior if needed.) 3 tablet 0 prn at prn   fentaNYL (DURAGESIC) 100 MCG/HR 1 patch every other day.   12/30/2021   furosemide (LASIX) 40 MG tablet Take 40 mg by mouth daily.   01/08/2022 at 0800   gemfibrozil (LOPID) 600 MG tablet Take 600 mg by mouth 2 (two) times daily before a meal.   01/03/2022 at 0800   lisinopril (PRINIVIL,ZESTRIL) 10 MG tablet Take 30 mg by mouth daily. (take 10 mg in the am and 20 mg in the evening to equal 30 mg daily)   01/17/2022 at 0800   metoprolol tartrate (LOPRESSOR) 25 MG tablet Take 25 mg by mouth 2 (two) times daily.   12/30/2021 at 0800   tamsulosin (FLOMAX) 0.4 MG CAPS capsule Take 0.4 mg by mouth daily.   01/18/2022   butalbital-aspirin-caffeine (FIORINAL) 50-325-40 MG capsule Take 1 capsule by mouth every 6 (six) hours as needed for headache.    prn at prn   docusate sodium (COLACE) 100 MG capsule Take 100 mg by mouth 3 (three) times daily.   prn at prn   levofloxacin (LEVAQUIN) 500 MG tablet Take 500 mg by mouth daily. (Patient not taking: Reported on 01/20/2022)   Completed Course   Oxycodone HCl 10 MG TABS Take 10 mg by mouth every 4 (four) hours as  needed.   prn at prn   trolamine salicylate (ASPERCREME) 10 % cream Apply 1 Application topically as needed.   prn at prn   Social History   Socioeconomic History   Marital status: Married    Spouse name: Not on file   Number of children: Not on file   Years of education: Not on file   Highest education level: Not on file  Occupational History   Not on file  Tobacco Use   Smoking status: Every Day    Packs/day: 1.00    Years: 40.00    Total pack years: 40.00    Types: Cigarettes   Smokeless tobacco: Never   Tobacco comments:  6-8 cigarettes per day  Substance and Sexual Activity   Alcohol use: No    Alcohol/week: 0.0 standard drinks of alcohol   Drug use: No   Sexual activity: Yes  Other Topics Concern   Not on file  Social History Narrative   Not on file   Social Determinants of Health   Financial Resource Strain: Not on file  Food Insecurity: Not on file  Transportation Needs: Not on file  Physical Activity: Not on file  Stress: Not on file  Social Connections: Not on file  Intimate Partner Violence: Not on file    Family History  Problem Relation Age of Onset   Arthritis Mother    Heart disease Mother        died During open heart surgery   Early death Father    Heart disease Father        Coronary Artery Disease    Asthma Son    Thyroid disease Sister       Review of systems complete and found to be negative unless listed above  PHYSICAL EXAM  General: Well developed, well nourished, in no acute distress HEENT:  Normocephalic and atraumatic. PERRL Neck:  No obviouse JVD. +2 pulses, negative for bruit Lungs: diminished bilaterally to auscultation. Chest expansion symmetrical. No wheezes, rales or rhonchi.  Heart: HRRR . Normal S1 and S2 without gallops. Grade II/VI systolic murmur.  Abdomen: Bowel sounds are positive, abdomen soft and non-tender  Msk:  Normal tone for age. Decreased strength Extremities: +2 pitting BLE edema. No clubbing or  cyanosis.  Neuro: Alert and oriented X 3. Psych: flat affect, responds appropriately  Labs:   Lab Results  Component Value Date   WBC 9.3 01/20/2022   HGB 13.2 01/20/2022   HCT 40.2 01/20/2022   MCV 95.7 01/20/2022   PLT 119 (L) 01/20/2022    Recent Labs  Lab 01/09/2022 1820 01/20/22 0642  NA  --  141  K  --  4.6  CL  --  110  CO2  --  23  BUN  --  86*  CREATININE  --  1.77*  CALCIUM  --  8.0*  PROT 6.9  --   BILITOT 0.8  --   ALKPHOS 105  --   ALT 20  --   AST 32  --   GLUCOSE  --  101*   No results found for: "CKTOTAL", "CKMB", "CKMBINDEX", "TROPONINI" No results found for: "CHOL" No results found for: "HDL" No results found for: "LDLCALC" No results found for: "TRIG" No results found for: "CHOLHDL" No results found for: "LDLDIRECT"    Radiology: ECHOCARDIOGRAM COMPLETE  Result Date: 01/20/2022    ECHOCARDIOGRAM REPORT   Patient Name:   ROLF FELLS Date of Exam: 01/20/2022 Medical Rec #:  [id number]         Height:       68.0 in Accession #:    [id number]        Weight:       182.1 lb Date of Birth:  Jul 24, 1945        BSA:          1.964 m Patient Age:    8 years          BP:           119/36 mmHg Patient Gender: M                 HR:  101 bpm. Exam Location:  [facility] Procedure: 2D Echo, Color Doppler and Cardiac Doppler Indications:     R06.00 Dyspnea; Elevated troponin  History:         Patient has prior history of Echocardiogram examinations, most                  recent 07/18/2020. CHF, CAD, COPD and PVD; Risk Factors:Current                  Smoker, Hypertension and Dyslipidemia.  Sonographer:     Charmayne Sheer Referring Phys:  [id number] AMY N COX Diagnosing Phys: Yolonda Kida MD IMPRESSIONS  1. Ant/Apical/Lateral Hypo.  2. EF=35-40%.  3. Left ventricular ejection fraction, by estimation, is 35 to 40%. The left ventricle has moderately decreased function. The left ventricle demonstrates regional wall motion abnormalities (see scoring diagram/findings for  description). The left ventricular internal cavity size was moderately dilated. There is mild asymmetric left ventricular hypertrophy of the septal segment. Left ventricular diastolic parameters are consistent with Grade III diastolic dysfunction (restrictive).  4. Right ventricular systolic function is low normal. The right ventricular size is mildly enlarged. Mildly increased right ventricular wall thickness.  5. Left atrial size was mild to moderately dilated.  6. Right atrial size was mildly dilated.  7. The mitral valve is grossly normal. Mild mitral valve regurgitation.  8. Tricuspid valve regurgitation is mild to moderate.  9. The aortic valve is calcified. There is moderate calcification of the aortic valve. There is moderate thickening of the aortic valve. Aortic valve regurgitation is mild to moderate. Mild aortic valve stenosis. Conclusion(s)/Recommendation(s): Findings consistent with ischemic cardiomyopathy. FINDINGS  Left Ventricle: Left ventricular ejection fraction, by estimation, is 35 to 40%. The left ventricle has moderately decreased function. The left ventricle demonstrates regional wall motion abnormalities. The left ventricular internal cavity size was moderately dilated. There is mild asymmetric left ventricular hypertrophy of the septal segment. Left ventricular diastolic parameters are consistent with Grade III diastolic dysfunction (restrictive). Right Ventricle: The right ventricular size is mildly enlarged. Mildly increased right ventricular wall thickness. Right ventricular systolic function is low normal. Left Atrium: Left atrial size was mild to moderately dilated. Right Atrium: Right atrial size was mildly dilated. Pericardium: There is no evidence of pericardial effusion. Mitral Valve: The mitral valve is grossly normal. Mild mitral valve regurgitation. Tricuspid Valve: The tricuspid valve is grossly normal. Tricuspid valve regurgitation is mild to moderate. Aortic Valve: The aortic  valve is calcified. There is moderate calcification of the aortic valve. There is moderate thickening of the aortic valve. There is mild to moderate aortic valve annular calcification. Aortic valve regurgitation is mild to moderate. Mild aortic stenosis is present. Aortic valve mean gradient measures 13.5 mmHg. Aortic valve peak gradient measures 24.5 mmHg. Aortic valve area, by VTI measures 1.81 cm. Pulmonic Valve: The pulmonic valve was normal in structure. Pulmonic valve regurgitation is not visualized. Aorta: The ascending aorta was not well visualized. IAS/Shunts: No atrial level shunt detected by color flow Doppler. Additional Comments: Ant/Apical/Lateral Hypo. EF=35-40%.  LEFT VENTRICLE PLAX 2D LVIDd:         5.40 cm   Diastology LVIDs:         3.90 cm   LV e' medial:    8.05 cm/s LV PW:         1.10 cm   LV E/e' medial:  16.1 LV IVS:        1.50 cm   LV e' lateral:  10.10 cm/s LVOT diam:     2.00 cm   LV E/e' lateral: 12.9 LV SV:         56 LV SV Index:   29 LVOT Area:     3.14 cm  RIGHT VENTRICLE RV Basal diam:  4.00 cm RV S prime:     5.55 cm/s LEFT ATRIUM             Index        RIGHT ATRIUM           Index LA diam:        4.50 cm 2.29 cm/m   RA Area:     12.80 cm LA Vol (A2C):   57.5 ml 29.28 ml/m  RA Volume:   29.50 ml  15.02 ml/m LA Vol (A4C):   52.1 ml 26.53 ml/m LA Biplane Vol: 59.3 ml 30.19 ml/m  AORTIC VALVE                     PULMONIC VALVE AV Area (Vmax):    1.52 cm      PV Vmax:       0.73 m/s AV Area (Vmean):   1.58 cm      PV Peak grad:  2.1 mmHg AV Area (VTI):     1.81 cm AV Vmax:           247.50 cm/s AV Vmean:          163.000 cm/s AV VTI:            0.312 m AV Peak Grad:      24.5 mmHg AV Mean Grad:      13.5 mmHg LVOT Vmax:         120.00 cm/s LVOT Vmean:        82.100 cm/s LVOT VTI:          0.179 m LVOT/AV VTI ratio: 0.57  AORTA Ao Root diam: 3.00 cm MITRAL VALVE MV Area (PHT): 6.27 cm     SHUNTS MV Decel Time: 121 msec     Systemic VTI:  0.18 m MV E velocity: 130.00 cm/s   Systemic Diam: 2.00 cm MV A velocity: 55.70 cm/s MV E/A ratio:  2.33 Asiya Cutbirth D Koni Kannan MD Electronically signed by Yolonda Kida MD Signature Date/Time: 01/20/2022/1:06:32 PM    Final    DG Chest 2 View  Result Date: 01/06/2022 CLINICAL DATA:  Shortness of breath, intermittent chest pain and weakness for 6 weeks. EXAM: CHEST - 2 VIEW COMPARISON:  None available FINDINGS: Previous median sternotomy and CABG procedure. Heart size appears normal. Aortic atherosclerotic calcifications. There is a small right pleural effusion. Diffuse increase interstitial markings are identified throughout both lungs. There is platelike atelectasis in the right base. No airspace consolidation. IMPRESSION: Small right pleural effusion and diffuse increased interstitial markings compatible with congestive heart failure. Electronically Signed   By: Kerby Moors M.D.   On: 01/17/2022 16:24   CT HEAD WO CONTRAST (5MM)  Result Date: 12/25/2021 CLINICAL DATA:  Status post fall on October 13, 2021 with persistent dizziness. EXAM: CT HEAD WITHOUT CONTRAST TECHNIQUE: Contiguous axial images were obtained from the base of the skull through the vertex without intravenous contrast. RADIATION DOSE REDUCTION: This exam was performed according to the departmental dose-optimization program which includes automated exposure control, adjustment of the mA and/or kV according to patient size and/or use of iterative reconstruction technique. COMPARISON:  None Available. FINDINGS: Brain: No evidence of acute infarction, hemorrhage, hydrocephalus,  extra-axial collection or mass lesion/mass effect. Chronic diffuse atrophy and chronic bilateral periventricular white matter small vessel ischemic changes are noted. Vascular: No hyperdense vessel is noted. Skull: Normal. Negative for fracture or focal lesion. Sinuses/Orbits: Complete opacity of the right maxillary sinus is identified. Mucoperiosteal thickening of bilateral ethmoid sinuses are noted. The  orbits are normal. Other: None. IMPRESSION: 1. No acute intracranial abnormality identified. 2. Chronic diffuse atrophy and chronic bilateral periventricular white matter small vessel ischemic changes. 3. Complete opacity of the right maxillary sinus. 4. Mucoperiosteal thickening of bilateral ethmoid sinuses. Electronically Signed   By: Abelardo Diesel M.D.   On: 12/25/2021 08:49    EKG: SR with a bifascicular block and a HR of 100 bpm, poor R wave progression and no acute ischemic changes   ASSESSMENT AND PLAN:  # fluid volume overload likely secondary to acute diastolic CHF # hypotension, acute, likely secondary to polypharmacy with opioids and benzodiazepines  The patient's symptoms are consistent with acute diastolic CHF with labs confirming elevated BNP >4,000 and CXR revealing a small right pleural effusion and diffuse increased interstitial markings compatible with congestive heart failure. This is new for the patient as his outpatient echocardiogram from 11/18/21 revealed normal LV function and moderate LVH, an estimated EF >55%,moderate mitral regurgitation noted to have progressed from mild in 2021 and prosthetic aortic valve with good function. Echo from 2020 notable for grade 2 abnormal relaxation. He was briefly started on oral lasix '40mg'$  as outpatient on 01/18/22 with minimal improvement in symptoms. And now his hypotension is limiting diuresis which is needed to improve the dyspnea symptoms. He is currently on 2L of supplemental oxygen with a saturation of 90%. COPD and tobacco use are also likely contributing factors to dyspnea.   ** the echocardiogram was significant for anterior, apical & lateral hypokineses with an estimated EF of 35-40% suggestive of ischemic cardiomyopathy. **  - recommend right and left cardiac catheterization with possible PCI at this time.   - give '20mg'$  of lasix IV at this time due to MAP being >60; instructed to given in 2 doses of '10mg'$ , 15-30 minutes apart to reduce  risk of worsening hypotension.  - continue to hold antihypertensives at this time.   - strict I&O's.   - daily weights.   - continue supplemental oxygen as needed.   - continue RT consult.   - recommend considering abdominal CT to rule intra-abdominal process due to patient's h/o splenomegaly and lymphadenopathy.     # s/p AVR, stable  # Moderate mitral regurgitation, chronic, stable Echo as above. These are likely contributing factors, but less likely to be the primary cause of dyspnea and fluid volume overload.   - echo pending.   # CAD, chronic, stable # elevated troponin due to NSTEMI # tobacco use  The patient is without chest pain at this time. Elevated troponin (641>>679) likely secondary to NSTEMI. Echo findings as above.   - cardiac cath recommendations as above.   - keep patient n.p.o at midnight.   - consider chemical stress test as outaptient.   - continue aspirin and atorvastatin.  - hold lisinopril and metoprolol due to hypotension and AKI.   - heart healthy diet.   - continue nicotine patch   # AKI Appears new for the patient. His creatinine from 12/10/21 was normal at 1.0 (BUN= 21), then elevated at 2.1 (BUN =92) on 01/18/22 and upon admission 1.98>> 1.77 with a BUN of 95>> 86. Likely contributing to fluid rentention.   -  lasix as above.   - consider nephrology consult.    Signed: Delicia White ACNPC-AG  01/20/2022, 1:36 PM

## 2022-01-20 NOTE — Progress Notes (Addendum)
Patient alert and oriented x4. Blood pressures soft, PRN medication given per order. Patient and family educated on pain management, and blood pressure management. Patient needs encouragement to change positions and keep heels elevated, at times not compliant. Patient and family educated on the call bell, and fall safety. Patient not compliant with safety measures. Bed alarm set. Patient PRN medications given per orders as tolerated by blood pressure throughout shift. When transferring to [facility] patient BP drops, patient reeducated on calling out and safety measures.   After speaking with the MD this evening, patient became very anxious regarding plan of treatment/prognosis. PRN medications given. Family notified nurse that the patient then complained of chest pain. When entering the room, Family at beside attempting to give home Nitroglycerin tablets. Family educated again at bedside not to give patient home medications. EKG obtained, MD and Cardiology MD notified. Patient very anxious with increased respirations, O2 sats decreased. Patient placed on high flow. Respiratory called. Placed on HHF. Patient reports increasingly more comfortable at bedside.   Patient currently denies chest pain, request oxy instead of nitro paste. Medication given. Education provided.

## 2022-01-21 ENCOUNTER — Inpatient Hospital Stay: Payer: Self-pay

## 2022-01-21 ENCOUNTER — Other Ambulatory Visit: Payer: Self-pay

## 2022-01-21 ENCOUNTER — Inpatient Hospital Stay: Payer: Medicare Other

## 2022-01-21 ENCOUNTER — Encounter: Admission: EM | Disposition: E | Payer: Self-pay | Source: Home / Self Care | Attending: Pulmonary Disease

## 2022-01-21 DIAGNOSIS — I5041 Acute combined systolic (congestive) and diastolic (congestive) heart failure: Secondary | ICD-10-CM | POA: Diagnosis not present

## 2022-01-21 DIAGNOSIS — N179 Acute kidney failure, unspecified: Secondary | ICD-10-CM

## 2022-01-21 LAB — HEPATIC FUNCTION PANEL
ALT: 30 U/L (ref 0–44)
AST: 45 U/L — ABNORMAL HIGH (ref 15–41)
Albumin: 2.8 g/dL — ABNORMAL LOW (ref 3.5–5.0)
Alkaline Phosphatase: 97 U/L (ref 38–126)
Bilirubin, Direct: 0.2 mg/dL (ref 0.0–0.2)
Indirect Bilirubin: 0.6 mg/dL (ref 0.3–0.9)
Total Bilirubin: 0.8 mg/dL (ref 0.3–1.2)
Total Protein: 6.4 g/dL — ABNORMAL LOW (ref 6.5–8.1)

## 2022-01-21 LAB — LACTIC ACID, PLASMA
Lactic Acid, Venous: 1.7 mmol/L (ref 0.5–1.9)
Lactic Acid, Venous: 1.1 mmol/L (ref 0.5–1.9)

## 2022-01-21 LAB — BASIC METABOLIC PANEL
Anion gap: 12 (ref 5–15)
BUN: 93 mg/dL — ABNORMAL HIGH (ref 8–23)
CO2: 21 mmol/L — ABNORMAL LOW (ref 22–32)
Calcium: 7.6 mg/dL — ABNORMAL LOW (ref 8.9–10.3)
Chloride: 109 mmol/L (ref 98–111)
Creatinine, Ser: 1.91 mg/dL — ABNORMAL HIGH (ref 0.61–1.24)
GFR, Estimated: 36 mL/min — ABNORMAL LOW (ref >60)
Glucose, Bld: 138 mg/dL — ABNORMAL HIGH (ref 70–99)
Potassium: 4.9 mmol/L (ref 3.5–5.1)
Sodium: 142 mmol/L (ref 135–145)

## 2022-01-21 LAB — GLUCOSE, CAPILLARY
Glucose-Capillary: 110 mg/dL — ABNORMAL HIGH (ref 70–99)
Glucose-Capillary: 114 mg/dL — ABNORMAL HIGH (ref 70–99)
Glucose-Capillary: 117 mg/dL — ABNORMAL HIGH (ref 70–99)
Glucose-Capillary: 122 mg/dL — ABNORMAL HIGH (ref 70–99)

## 2022-01-21 LAB — HEPARIN LEVEL (UNFRACTIONATED): Heparin Unfractionated: 0.34 IU/mL (ref 0.30–0.70)

## 2022-01-21 LAB — MAGNESIUM: Magnesium: 2.7 mg/dL — ABNORMAL HIGH (ref 1.7–2.4)

## 2022-01-21 LAB — CBC
HCT: 39.2 % (ref 39.0–52.0)
Hemoglobin: 12.7 g/dL — ABNORMAL LOW (ref 13.0–17.0)
MCH: 30.9 pg (ref 26.0–34.0)
MCHC: 32.4 g/dL (ref 30.0–36.0)
MCV: 95.4 fL (ref 80.0–100.0)
Platelets: 145 10*3/uL — ABNORMAL LOW (ref 150–400)
RBC: 4.11 MIL/uL — ABNORMAL LOW (ref 4.22–5.81)
RDW: 17 % — ABNORMAL HIGH (ref 11.5–15.5)
WBC: 10.3 10*3/uL (ref 4.0–10.5)
nRBC: 0.2 % (ref 0.0–0.2)

## 2022-01-21 LAB — TROPONIN I (HIGH SENSITIVITY)
Troponin I (High Sensitivity): 1476 ng/L (ref <18)
Troponin I (High Sensitivity): 1471 ng/L (ref <18)

## 2022-01-21 SURGERY — RIGHT/LEFT HEART CATH AND CORONARY ANGIOGRAPHY
Anesthesia: Moderate Sedation

## 2022-01-21 MED ORDER — GEMFIBROZIL 600 MG PO TABS
600.0000 mg | ORAL_TABLET | Freq: Two times a day (BID) | ORAL | Status: DC
  Administered 2022-01-21: 600 mg
  Filled 2022-01-21 (×2): qty 1

## 2022-01-21 MED ORDER — MILRINONE LACTATE IN DEXTROSE 20-5 MG/100ML-% IV SOLN
0.1250 ug/kg/min | INTRAVENOUS | Status: DC
  Administered 2022-01-21 – 2022-01-22 (×2): 0.25 ug/kg/min via INTRAVENOUS
  Filled 2022-01-21 (×2): qty 100

## 2022-01-21 MED ORDER — MIDAZOLAM HCL 2 MG/2ML IJ SOLN
INTRAMUSCULAR | Status: AC
  Administered 2022-01-21: 2 mg via INTRAVENOUS
  Filled 2022-01-21: qty 2

## 2022-01-21 MED ORDER — OXYCODONE HCL 5 MG PO TABS: 5.0000 mg | ORAL_TABLET | ORAL | Status: DC | PRN

## 2022-01-21 MED ORDER — MIDAZOLAM HCL 2 MG/2ML IJ SOLN: 2.0000 mg | Freq: Once | INTRAMUSCULAR | Status: AC

## 2022-01-21 MED ORDER — PANTOPRAZOLE 2 MG/ML SUSPENSION
40.0000 mg | Freq: Every day | ORAL | Status: DC
  Administered 2022-01-21: 40 mg
  Filled 2022-01-21: qty 20

## 2022-01-21 MED ORDER — BUSPIRONE HCL 10 MG PO TABS
15.0000 mg | ORAL_TABLET | Freq: Three times a day (TID) | ORAL | Status: DC
  Administered 2022-01-21 (×2): 15 mg
  Filled 2022-01-21 (×2): qty 2

## 2022-01-21 MED ORDER — FENTANYL 2500MCG IN NS 250ML (10MCG/ML) PREMIX INFUSION
0.0000 ug/h | INTRAVENOUS | Status: DC
  Administered 2022-01-21: 100 ug/h via INTRAVENOUS
  Filled 2022-01-21 (×2): qty 250

## 2022-01-21 MED ORDER — FENTANYL CITRATE (PF) 100 MCG/2ML IJ SOLN: 100.0000 ug | Freq: Once | INTRAMUSCULAR | Status: AC

## 2022-01-21 MED ORDER — DOCUSATE SODIUM 50 MG/5ML PO LIQD
100.0000 mg | Freq: Two times a day (BID) | ORAL | Status: DC
  Administered 2022-01-21 – 2022-01-22 (×2): 100 mg
  Filled 2022-01-21 (×2): qty 10

## 2022-01-21 MED ORDER — SODIUM CHLORIDE 0.9 % IV SOLN: 250.0000 mL | INTRAVENOUS | Status: DC

## 2022-01-21 MED ORDER — PROPOFOL 1000 MG/100ML IV EMUL
5.0000 ug/kg/min | INTRAVENOUS | Status: DC
  Administered 2022-01-21: 15 ug/kg/min via INTRAVENOUS
  Administered 2022-01-22: 25 ug/kg/min via INTRAVENOUS
  Administered 2022-01-22: 20 ug/kg/min via INTRAVENOUS
  Administered 2022-01-23: 15 ug/kg/min via INTRAVENOUS
  Filled 2022-01-21 (×4): qty 100

## 2022-01-21 MED ORDER — ORAL CARE MOUTH RINSE
15.0000 mL | OROMUCOSAL | Status: DC
  Administered 2022-01-21 – 2022-01-23 (×21): 15 mL via OROMUCOSAL

## 2022-01-21 MED ORDER — ROCURONIUM BROMIDE 10 MG/ML (PF) SYRINGE
50.0000 mg | PREFILLED_SYRINGE | Freq: Once | INTRAVENOUS | Status: AC
  Filled 2022-01-21: qty 5

## 2022-01-21 MED ORDER — SODIUM BICARBONATE 8.4 % IV SOLN
50.0000 meq | Freq: Once | INTRAVENOUS | Status: AC
  Administered 2022-01-21: 50 meq via INTRAVENOUS
  Filled 2022-01-21: qty 50

## 2022-01-21 MED ORDER — ATORVASTATIN CALCIUM 20 MG PO TABS: 20.0000 mg | ORAL_TABLET | Freq: Every day | ORAL | Status: DC

## 2022-01-21 MED ORDER — FUROSEMIDE 10 MG/ML IJ SOLN
10.0000 mg/h | INTRAVENOUS | Status: DC
  Administered 2022-01-21 – 2022-01-22 (×2): 10 mg/h via INTRAVENOUS
  Filled 2022-01-21 (×2): qty 20

## 2022-01-21 MED ORDER — ORAL CARE MOUTH RINSE: 15.0000 mL | OROMUCOSAL | Status: DC | PRN

## 2022-01-21 MED ORDER — PROPOFOL 1000 MG/100ML IV EMUL
INTRAVENOUS | Status: AC
  Administered 2022-01-21: 40 ug/kg/min via INTRAVENOUS
  Filled 2022-01-21: qty 100

## 2022-01-21 MED ORDER — AMIODARONE HCL 200 MG PO TABS
200.0000 mg | ORAL_TABLET | Freq: Two times a day (BID) | ORAL | Status: DC
  Administered 2022-01-21: 200 mg via ORAL
  Filled 2022-01-21: qty 1

## 2022-01-21 MED ORDER — MIDAZOLAM HCL 2 MG/2ML IJ SOLN
1.0000 mg | INTRAMUSCULAR | Status: DC | PRN
  Administered 2022-01-22: 1 mg via INTRAVENOUS
  Filled 2022-01-21: qty 2

## 2022-01-21 MED ORDER — MIDAZOLAM HCL 2 MG/2ML IJ SOLN: 1.0000 mg | INTRAMUSCULAR | Status: DC | PRN

## 2022-01-21 MED ORDER — FENTANYL CITRATE (PF) 100 MCG/2ML IJ SOLN
INTRAMUSCULAR | Status: AC
  Administered 2022-01-21: 100 ug
  Filled 2022-01-21: qty 2

## 2022-01-21 MED ORDER — ROCURONIUM BROMIDE 10 MG/ML (PF) SYRINGE
PREFILLED_SYRINGE | INTRAVENOUS | Status: AC
  Administered 2022-01-21: 50 mg via INTRAVENOUS
  Filled 2022-01-21: qty 10

## 2022-01-21 MED ORDER — DIAZEPAM 5 MG PO TABS
5.0000 mg | ORAL_TABLET | Freq: Four times a day (QID) | ORAL | Status: DC
  Administered 2022-01-21: 5 mg via ORAL
  Filled 2022-01-21: qty 1

## 2022-01-21 MED ORDER — FUROSEMIDE 10 MG/ML IJ SOLN
10.0000 mg | Freq: Once | INTRAMUSCULAR | Status: AC
  Administered 2022-01-21: 10 mg via INTRAVENOUS
  Filled 2022-01-21: qty 2

## 2022-01-21 MED ORDER — NOREPINEPHRINE 16 MG/250ML-% IV SOLN
0.0000 ug/min | INTRAVENOUS | Status: DC
  Administered 2022-01-21: 20 ug/min via INTRAVENOUS
  Administered 2022-01-22: 19 ug/min via INTRAVENOUS
  Administered 2022-01-22: 25 ug/min via INTRAVENOUS
  Administered 2022-01-23 (×2): 40 ug/min via INTRAVENOUS
  Filled 2022-01-21 (×6): qty 250

## 2022-01-21 MED ORDER — AMIODARONE HCL 200 MG PO TABS
200.0000 mg | ORAL_TABLET | Freq: Two times a day (BID) | ORAL | Status: DC
  Administered 2022-01-21: 200 mg
  Filled 2022-01-21: qty 1

## 2022-01-21 MED ORDER — CITALOPRAM HYDROBROMIDE 20 MG PO TABS: 20.0000 mg | ORAL_TABLET | Freq: Every day | ORAL | Status: DC

## 2022-01-21 MED ORDER — ALBUMIN HUMAN 25 % IV SOLN
25.0000 g | Freq: Once | INTRAVENOUS | Status: AC
  Administered 2022-01-21: 25 g via INTRAVENOUS
  Filled 2022-01-21: qty 100

## 2022-01-21 MED ORDER — MIDAZOLAM HCL 2 MG/2ML IJ SOLN
INTRAMUSCULAR | Status: AC
  Administered 2022-01-21: 2 mg
  Filled 2022-01-21: qty 2

## 2022-01-21 MED ORDER — POLYETHYLENE GLYCOL 3350 17 G PO PACK
17.0000 g | PACK | Freq: Every day | ORAL | Status: DC
  Administered 2022-01-21: 17 g
  Filled 2022-01-21: qty 1

## 2022-01-21 MED ORDER — MIDODRINE HCL 5 MG PO TABS
10.0000 mg | ORAL_TABLET | Freq: Three times a day (TID) | ORAL | Status: DC
  Administered 2022-01-21: 10 mg via ORAL
  Filled 2022-01-21: qty 2

## 2022-01-21 MED ORDER — FENTANYL BOLUS VIA INFUSION
25.0000 ug | INTRAVENOUS | Status: DC | PRN
  Administered 2022-01-22: 100 ug via INTRAVENOUS

## 2022-01-21 MED ORDER — NOREPINEPHRINE 4 MG/250ML-% IV SOLN
2.0000 ug/min | INTRAVENOUS | Status: DC
  Administered 2022-01-21: 2 ug/min via INTRAVENOUS
  Filled 2022-01-21: qty 250

## 2022-01-21 MED ORDER — PANTOPRAZOLE SODIUM 40 MG IV SOLR: 40.0000 mg | Freq: Every day | INTRAVENOUS | Status: DC

## 2022-01-21 NOTE — Consult Note (Signed)
ANTICOAGULATION CONSULT NOTE - Initial Consult  Pharmacy Consult for IV heparin infusion Indication: chest pain/ACS  Allergies  Allergen Reactions   Celebrex [Celecoxib] Other (See Comments) and Shortness Of Breath     CHEST PAIN Chest pain   Carvedilol Other (See Comments)    Extreme fatigue   Penicillins Other (See Comments) and Rash    unknown    Patient Measurements: Height: '5\' 8"'$  (172.7 cm) Weight: 82.6 kg (182 lb 1.6 oz) IBW/kg (Calculated) : 68.4 Heparin Dosing Weight: 82.6 kg   Vital Signs: Temp: 98.6 F (37 C) (09/28 0000) Temp Source: Oral (09/27 2000) BP: 99/39 (09/28 0615) Pulse Rate: 80 (09/28 0615)  Labs: Recent Labs    01/03/2022 1550 01/18/2022 1820 01/20/22 0642 01/20/22 1841 01/01/2022 0519 12/27/2021 0644  HGB 13.3  --  13.2  --  12.7*  --   HCT 41.8  --  40.2  --  39.2  --   PLT 149*  --  119*  --  145*  --   HEPARINUNFRC  --   --   --   --   --  0.34  CREATININE 1.98*  --  1.77*  --  1.91*  --   TROPONINIHS 641* 679*  --  840*  --   --      Estimated Creatinine Clearance: 35.6 mL/min (A) (by C-G formula based on SCr of 1.91 mg/dL (H)).   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-sided sciatica    COPD (chronic obstructive pulmonary disease) (HCC)    DDD (degenerative disc disease), lumbar    Hyperlipidemia    Hypertension    Large B-cell lymphoma (HCC)    Pain syndrome, chronic    Pre-diabetes    PVD (peripheral vascular disease) (HCC)    Spondylosis, lumbar, with myelopathy    Tobacco abuse     Medications:  Heparin 5000 units SQ - last dose 9/27 1346  Assessment: 76 year old male with history of anxiety, hypertension, hyperlipidemia, nonrheumatic mitral valve regurgitation, status post AVR, CAD status post CABG, atrial fibrillation, presented to ED 01/04/2022 for chief  concerns of shortness of breath for approximately 6 weeks with intermittent chest pain  Goal of Therapy:  Heparin level 0.3-0.7 units/ml Monitor platelets by anticoagulation protocol: Yes   Plan:  9/28:  HL @ 0644 = 0.34, therapeutic X 1  Will continue pt on current rate and recheck HL in 8 hrs on 9/28 @ 1500.   Orene Desanctis, PharmD Clinical Pharmacist   12/29/2021,7:18 AM

## 2022-01-21 NOTE — Procedures (Signed)
Intubation Procedure Note  [name]  [id number]  01-12-1946  Date:01/10/2022  Time:12:39 PM   Provider Performing:Amyla Heffner D [name]    Procedure: Intubation (31500)  Indication(s) Respiratory Failure  Consent Risks of the procedure as well as the alternatives and risks of each were explained to the patient and/or caregiver.  Consent for the procedure was obtained and is signed in the bedside chart   Anesthesia Versed, Fentanyl, and Rocuronium   Time Out Verified patient identification, verified procedure, site/side was marked, verified correct patient position, special equipment/implants available, medications/allergies/relevant history reviewed, required imaging and test results available.   Sterile Technique Usual hand hygeine, masks, and gloves were used   Procedure Description Patient positioned in bed supine.  Sedation given as noted above.  Patient was intubated with endotracheal tube using Glidescope.  View was Grade 1 full glottis .  Number of attempts was 1.  Colorimetric CO2 detector was consistent with tracheal placement.   Complications/Tolerance None; patient tolerated the procedure well. Chest X-ray is ordered to verify placement.   EBL Minimal   Specimen(s) None   Size 8.0 ETT Secured at 23 cm at lip   Darel Hong, AGACNP-BC [place] epic messenger for cross cover needs If after hours, please call E-link

## 2022-01-21 NOTE — Progress Notes (Signed)
   PATIENT:[name]    HRC:[id number] DOB: 09/29/1945    Cross Cover Overnight events Patient developed A fib with RVR and increasing shortness of breath with increasing oxygen requirements with borderline to low BPs. Per cardiologist Dr Clayborn Bigness, patient was started on amiodarone bolus + infusion and heparin. Also given dose of midodrine Was given previously ordered furosemide dose of 20 mg with very poor response Rate somewhat improved and then had episode of chest pain accompanied by diaphoresis and increased work of breathing. Improved after 3 SL doses of nitro but oxygen requirements now 100% HHF EKG shows sinus rhythm RBBB and LPFB ABG 7.35 - 29 - 67 - 16 Stat chest xray reviewed by me shows worsening pulmonary edema  Based on low EF, bicarb deficit,  low BP, AKI poor response to prior dose of lasix and need for diuresis 1 amp bicarb IV 25 gm albumin x1 f/b Furosemide 10 mg injection x1 f/b Furosemide infusion and 10 mg/h  Kathlene Cote NP [facility]

## 2022-01-21 NOTE — Progress Notes (Signed)
PROGRESS NOTE    [name]  ZOX:[id number] DOB: 21-Aug-1945 DOA: 01/03/2022 PCP: Idelle Crouch, MD  IC10A/IC10A-AA  LOS: 2 days   Brief hospital course:   Assessment & Plan: Mr. Marq Rebello is a 76 year old male with history of anxiety, depression, chronic back pain, hypertension, BPH, hyperlipidemia, nonrheumatic mitral valve regurgitation, status post AVR, CAD status post CABG (SVG-PDA, SVG-OM2), atrial fibrillation, non-Hodgkin's lymphoma, who presents emergency department for chief concerns of shortness of breath for approximately 6 weeks with intermittent chest pain.     * Acute combined systolic and diastolic congestive heart failure (HCC) --current Echo showed new reduced LVEF 35 to 40% with regional wall motion abnormalities. --cardio consulted --pt started on lasix gtt Plan: --cont lasix gtt --start milrinone gtt --defer cardiac cath at this time  Acute hypoxemic respiratory failure (HCC) 2/2 volume overload resulting in pulm edema and pleural effusion --currently on heated hf --cont diuresis   Hypotension --BP trending down to 80's --start Levo gtt  AKI (acute kidney injury) ([place]) Likely cardiorenal syndrome Plan: --consult nephrology today --cont lasix gtt  BPH (benign prostatic hyperplasia) - Holding home tamsulosin due to hypotension  Chronic constipation --due to large amount of home opioids use --aggressive Miralax  Anxiety and depression - Patient takes buspirone 15 mg 3 times daily, Celexa 20 mg daily, and Valium  --cont home regimen  Chronic back pain - Patient takes oxycodone 10 mg every 4 hours as needed for moderate pain, fentanyl 100 mcg transdermal patch every other day, these have been resumed --reduce oxycodone to 5 mg q4h PRN due to hypotension  Elevated troponin - trop 600 on presentation, heparin gtt not recommended initially --current Echo showed new reduced LVEF 35 to 40% with regional wall motion  abnormalities --trop trended up to 1400's, heparin gtt started Plan: --cont heparin gtt --defer cardiac cath at this time, per cardio   Tobacco abuse - Nicotine patch ordered by EDP  CAD (coronary artery disease) --cont statin   DVT prophylaxis: WJ:[id number] gtt Code Status: Full code  Family Communication: wife updated at bedside today Level of care: ICU Dispo:   The patient is from: home Anticipated d/c is to: undetermined Anticipated d/c date is: undetermined   Subjective and Interval History:  Pt worsened since yesterday, with increased O2 requirement now needing heated hf.  CXR showing worsening CHF, was started on lasix gtt overnight.    Pt also complained of chest pain, which he said was associated with Afib RVR.  Pt was started on amio gtt and heparin gtt.  Cardio started pt on milrinone gtt this morning.  BP trending down to 80's, so pt was started on pressor.    Due to rapid deterioration and being on pressor, pt was transferred over to ICU as primary.   Objective: Vitals:   12/27/2021 1645 12/30/2021 1700 01/18/2022 1715 01/11/2022 1730  BP: (!) 111/38 (!) 114/42 (!) 123/40 (!) 120/39  Pulse: 74 74 76 76  Resp: '16 16 17 16  '$ Temp:      TempSrc:      SpO2: 99% 98% 99% 98%  Weight:      Height:        Intake/Output Summary (Last 24 hours) at 01/02/2022 1829 Last data filed at 01/18/2022 1601 Gross per 24 hour  Intake 869.75 ml  Output 800 ml  Net 69.75 ml   Filed Weights   12/30/2021 1546 01/20/22 0217 01/11/2022 0645  Weight: 78 kg 82.6 kg 84.6 kg    Examination:  Constitutional: NAD, AAOx3 HEENT: conjunctivae and lids normal, EOMI CV: No cyanosis.   RESP: on heated hf Extremities: edema in BLE Neuro: II - XII grossly intact.   Psych: depressed mood and affect.     Data Reviewed: I have personally reviewed labs and imaging studies  Time spent: 50 minutes  Enzo Bi, MD [facility] If 7PM-7AM, please contact night-coverage 01/02/2022, 6:29 PM

## 2022-01-21 NOTE — Consult Note (Signed)
NAME:  [name], MRN:  [id number], DOB:  07/29/1945, LOS: 2 ADMISSION DATE:  01/05/2022, CONSULTATION DATE:  01/03/2022 REFERRING MD:  Dr. Billie Ruddy, CHIEF COMPLAINT:  Shortness of Breath, Cardiogenic shock   Brief Pt Description / Synopsis:  76 y.o. Male admitted with Acute Hypoxic Respiratory Failure in the setting of Acute Decompensated combined Systolic and Diastolic CHF, Cardiogenic shock, and Cardiorenal Syndrome.  History of Present Illness:  [name] is a 76 year old male with a past medical history significant for CAD status post CABG, atrial fibrillation, nonrheumatic mitral valve regurgitation, status post AVR, hypertension, hyperlipidemia, BPH non-Hodgkin's lymphoma, chronic back pain, anxiety, depression who presented to [facility] ED on 01/11/2022 due to complaints of shortness of breath and intermittent chest pain.  He endorsed symptoms have been progressive over the last 6 weeks.  He also endorsed orthopnea, dyspnea on exertion, and approximately 20 pound weight gain with bilateral lower extremity swelling and edema.  He denied fever, chills, abdominal pain, dysuria.  Initial vitals in the emergency department showed temperature 97.7, respiration rate of 20, heart rate of 101, blood pressure 113/40, now 110/41, SPO2 of 91% on 3 L nasal cannula.   Serum sodium is 140, potassium 4.9, chloride of 109, bicarb 22, BUN 95, serum creatinine 1.98, GFR 35, nonfasting blood glucose 173, WBC 8.4, hemoglobin 13.3, platelets of 149.   BNP was elevated at 4147.3.  High sensitive troponin was 641 and increased to 679.   ED treatment: Fentanyl 50 mcg, furosemide 40 mg IV one-time dose, nicotine patch, ondansetron 4 mg IV  He was admitted by the hospitalist for further work-up and treatment of acute decompensated CHF.  Cardiology was consulted.  Please see "significant hospital events" section below for full detailed hospital course.   Pertinent  Medical History   Past Medical History:   Diagnosis Date   Aortic stenosis    CAD (coronary artery disease)    Cervicalgia    CHF (congestive heart failure) (HCC)    Chronic bilateral low back pain with right-sided sciatica    COPD (chronic obstructive pulmonary disease) (HCC)    DDD (degenerative disc disease), lumbar    Hyperlipidemia    Hypertension    Large B-cell lymphoma (HCC)    Pain syndrome, chronic    Pre-diabetes    PVD (peripheral vascular disease) (HCC)    Spondylosis, lumbar, with myelopathy    Tobacco abuse      Micro Data:  9/27: SARS-CoV-2 PCR>> negative 9/27: MRSA PCR>> negative  Antimicrobials:  N/A  Significant Hospital Events: Including procedures, antibiotic start and stop dates in addition to other pertinent events   9/26: Admitted by hospitalist for CHF exacerbation.  Cardiology consulted. 9/27: Patient with chest pain, concern for possible NSTEMI.  Placed on heparin drip.  Later developed atrial fibrillation with RVR requiring amiodarone drip. 9/28: Developed hypotension, start on Milrinone due to developing cardiorenal syndrome as per nephrology/Cardiology.  Became hypotensive requiring Levophed, PCCM consulted. Code status changed to LTD Code (NO CPR). Worsening respiratory status requiring intubation.  Interim History / Subjective:  -Patient with worsening creatinine today, nephrology and cardiology recommended initiation of milrinone -However patient became hypotensive, therefore PCCM consulted for Levophed -On initial assessment patient with mild tachypnea and requiring high FiO2 of 70% on heated high flow nasal cannula ~ Patient with deterioration in respiratory status, tachypneic with increased work of breathing and patient reports like he is getting tired ~discussed with patient and family who want to try intubation short-term, do not want CPR  performed  Objective   Blood pressure (!) 113/45, pulse 78, temperature (!) 97.4 F (36.3 C), temperature source Oral, resp. rate (!) 23,  height _0  (1.727 m), weight 84.6 kg, SpO2 97 %.    FiO2 (%):  [60 %-100 %] 70 %   Intake/Output Summary (Last 24 hours) at 01/17/2022 1135 Last data filed at 01/11/2022 0800 Gross per 24 hour  Intake 516.29 ml  Output 800 ml  Net -283.71 ml   Filed Weights   01/04/2022 1546 01/20/22 0217 01/12/2022 0645  Weight: 78 kg 82.6 kg 84.6 kg    Examination: General: Acute on chronically ill-appearing male, laying in bed, on heated high flow nasal cannula, with moderate respiratory distress HENT: Atraumatic, normocephalic, neck supple, positive JVD Lungs: Coarse breath sounds bilaterally, tachypnea, mild accessory muscle use Cardiovascular: Regular rate and rhythm, S1-S2, no murmurs, rubs, gallops Abdomen: Soft, nontender, nondistended, no guarding rebound tenderness, bowel sounds positive x4 Extremities: Cool extremities, 3+ edema bilateral lower extremities Neuro: Awake and alert, oriented x4, moves all extremities to command, no focal deficits GU: External male catheter in place  Resolved Hospital Problem list     Assessment & Plan:   Cardiogenic Shock Acute Decompensated combined Systolic & Diastolic CHF Atrial Fibrillation w/ RVR ~ converted back to NSR Elevated troponin, demand ischemia vs NSTEMI. PMHx: CAD s/p CABG x2 in 2013, Moderate mitral regurgitation, s/p AVR Echocardiogram 01/20/2022: LVEF 35 to 18%, grade 3 diastolic dysfunction, RV systolic function low normal, mild to moderate tricuspid regurgitation, mild to moderate aortic regurgitation -Continuous cardiac monitoring -Maintain MAP >65 -Cardiology following, appreciate input -Inotrope's as per Cardiology ~  follow Coox panel -Vasopressors as needed to maintain MAP goal -Heparin gtt and Amiodarone gtt -Trend lactic acid until normalized -HS Troponin peaked at 1476 -Diuresis as renal function and blood pressure permits ~currently holding Lasix drip due to hypotension  Acute Hypoxic Respiratory Failure in the setting  of CHF PMHx: Tobacco abuse -Full vent support, implement lung protective strategies -Plateau pressures less than 30 cm H20 -Wean FiO2 & PEEP as tolerated to maintain O2 sats >92% -Follow intermittent Chest X-ray & ABG as needed -Spontaneous Breathing Trials when respiratory parameters met and mental status permits -Implement VAP Bundle -Prn Bronchodilators -Diuresis as blood pressure and renal function permits  AKI in setting of Cardiorenal Syndrome PMHx: BPH -Monitor I&O's / urinary output -Follow BMP -Ensure adequate renal perfusion -Avoid nephrotoxic agents as able -Replace electrolytes as indicated -Nephrology following, appreciate input  Sedation needs in setting of mechanical ventilation -Maintain a RASS goal of 0 to -1 -Fentanyl and Propofol as needed to maintain RASS goal -Avoid sedating medications as able -Daily wake up assessment    Patient is critically ill with end-stage combined CHF.  High risk for further deterioration, cardiac arrest and death.  Recommend full DNR status.  Long-term prognosis is extremely poor.  Will consult palliative care to assist with goals of care.    Best Practice (right click and "Reselect all SmartList Selections" daily)   Diet/type: NPO DVT prophylaxis: systemic heparin GI prophylaxis: PPI Lines: Central line and yes and it is still needed Foley:  Yes, and it is still needed Code Status:  limited (NO CPR) Last date of multidisciplinary goals of care discussion [01/14/2022]  9/28: Patient's wife and son updated at bedside  Labs   CBC: Recent Labs  Lab 01/14/2022 1550 01/20/22 0642 01/07/2022 0519  WBC 8.4 9.3 10.3  HGB 13.3 13.2 12.7*  HCT 41.8 40.2 39.2  MCV 96.5  95.7 95.4  PLT 149* 119* 145*    Basic Metabolic Panel: Recent Labs  Lab 01/13/2022 1550 01/20/22 0642 01/07/2022 0519  NA 140 141 142  K 4.9 4.6 4.9  CL 109 110 109  CO2 22 23 21*  GLUCOSE 173* 101* 138*  BUN 95* 86* 93*  CREATININE 1.98* 1.77* 1.91*   CALCIUM 8.3* 8.0* 7.6*  MG  --   --  2.7*   GFR: Estimated Creatinine Clearance: 35.9 mL/min (A) (by C-G formula based on SCr of 1.91 mg/dL (H)). Recent Labs  Lab 12/29/2021 1550 01/20/22 0642 01/16/2022 0519  WBC 8.4 9.3 10.3    Liver Function Tests: Recent Labs  Lab 01/15/2022 1820 12/31/2021 0519  AST 32 45*  ALT 20 30  ALKPHOS 105 97  BILITOT 0.8 0.8  PROT 6.9 6.4*  ALBUMIN 3.1* 2.8*   No results for input(s): "LIPASE", "AMYLASE" in the last 168 hours. No results for input(s): "AMMONIA" in the last 168 hours.  ABG    Component Value Date/Time   PHART 7.35 12/28/2021 0259   PCO2ART 29 (L) 01/09/2022 0259   PO2ART 67 (L) 01/18/2022 0259   HCO3 16.0 (L) 01/14/2022 0259   ACIDBASEDEF 8.2 (H) 12/28/2021 0259   O2SAT 93.8 01/14/2022 0259     Coagulation Profile: No results for input(s): "INR", "PROTIME" in the last 168 hours.  Cardiac Enzymes: No results for input(s): "CKTOTAL", "CKMB", "CKMBINDEX", "TROPONINI" in the last 168 hours.  HbA1C: No results found for: "HGBA1C"  CBG: Recent Labs  Lab 01/20/22 0212 01/06/2022 0754  GLUCAP 106* 114*    Review of Systems:   Positives in BOLD: Gen: Denies fever, chills, weight change, fatigue, night sweats HEENT: Denies blurred vision, double vision, hearing loss, tinnitus, sinus congestion, rhinorrhea, sore throat, neck stiffness, dysphagia PULM: Denies shortness of breath, cough, sputum production, hemoptysis, wheezing CV: Denies chest pain, edema, orthopnea, paroxysmal nocturnal dyspnea, palpitations GI: Denies abdominal pain, nausea, vomiting, diarrhea, hematochezia, melena, constipation, change in bowel habits GU: Denies dysuria, hematuria, polyuria, oliguria, urethral discharge Endocrine: Denies hot or cold intolerance, polyuria, polyphagia or appetite change Derm: Denies rash, dry skin, scaling or peeling skin change Heme: Denies easy bruising, bleeding, bleeding gums Neuro: Denies headache, numbness, weakness,  slurred speech, loss of memory or consciousness   Past Medical History:  He,  has a past medical history of Aortic stenosis, CAD (coronary artery disease), Cervicalgia, CHF (congestive heart failure) (HCC), 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   Surgical History:   Past Surgical History:  Procedure Laterality Date   cardiac stents     CORONARY ANGIOPLASTY     CORONARY ARTERY BYPASS GRAFT     VSD REPAIR       Social History:   reports that he has been smoking cigarettes. He has a 40.00 pack-year smoking history. He has never used smokeless tobacco. He reports that he does not drink alcohol and does not use drugs.   Family History:  His family history includes Arthritis in his mother; Asthma in his son; Early death in his father; Heart disease in his father and mother; Thyroid disease in his sister.   Allergies Allergies  Allergen Reactions   Celebrex [Celecoxib] Other (See Comments) and Shortness Of Breath     CHEST PAIN Chest pain   Carvedilol  Other (See Comments)    Extreme fatigue   Penicillins Other (See Comments) and Rash    unknown     Home Medications  Prior to Admission medications   Medication Sig Start Date End Date Taking? Authorizing Provider  atorvastatin (LIPITOR) 20 MG tablet Take 20 mg by mouth daily.  05/30/15  Yes [provider]  busPIRone (BUSPAR) 15 MG tablet Take 15 mg by mouth 3 (three) times daily.  05/30/15  Yes [provider]  citalopram (CELEXA) 20 MG tablet Take 20 mg by mouth daily.   Yes [provider]  diazepam (VALIUM) 5 MG tablet One pill 45-60 mins prior to scan; 1 pill 15 mins prior if needed. Patient taking  differently: Take 5 mg by mouth every 6 (six) hours as needed. One pill 45-60 mins prior to scan; 1 pill 15 mins prior if needed. 07/17/20  Yes Cammie Sickle, MD  fentaNYL (DURAGESIC) 100 MCG/HR 1 patch every other day. 06/18/20  Yes [provider]  furosemide (LASIX) 40 MG tablet Take 40 mg by mouth daily. 01/18/22  Yes [provider]  gemfibrozil (LOPID) 600 MG tablet Take 600 mg by mouth 2 (two) times daily before a meal.   Yes [provider]  lisinopril (PRINIVIL,ZESTRIL) 10 MG tablet Take 30 mg by mouth daily. (take 10 mg in the am and 20 mg in the evening to equal 30 mg daily)   Yes [provider]  metoprolol tartrate (LOPRESSOR) 25 MG tablet Take 25 mg by mouth 2 (two) times daily.   Yes [provider]  tamsulosin (FLOMAX) 0.4 MG CAPS capsule Take 0.4 mg by mouth daily.   Yes [provider]  butalbital-aspirin-caffeine Acquanetta Chain) 50-325-40 MG capsule Take 1 capsule by mouth every 6 (six) hours as needed for headache.     [provider]  docusate sodium (COLACE) 100 MG capsule Take 100 mg by mouth 3 (three) times daily.    [provider]  levofloxacin (LEVAQUIN) 500 MG tablet Take 500 mg by mouth daily. Patient not taking: Reported on 01/12/2022 01/08/22   [provider]  Oxycodone HCl 10 MG TABS Take 10 mg by mouth every 4 (four) hours as needed. 01/08/22   [provider]  trolamine salicylate (ASPERCREME) 10 % cream Apply 1 Application topically as needed.    [provider]     Critical care time: 55 minutes     Darel Hong, AGACNP-BC [facility] epic messenger for cross cover needs If after hours, please call E-link

## 2022-01-21 NOTE — Progress Notes (Addendum)
[name] Cardiology  Patient Description: 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   Overnight at approximately 2013  pm, the patient began to experience chest pain and worsening dyspnea. He was treated with high flow oxygen and oxycodone with improvement in symptoms. A heparin drip was initiated at that time. However, chest pain returned at approximately 0200 and the patient was given 3 does of SL nitro which did successfully resolve the symptoms. Later in the morning at approximately 0500 the patient went into atrial fibrillation with RVR. He was treated with 1 amp of bicarb, 25 gm of albumin, amiodarone bolus & infusion and a furosemide '10mg'$  injection followed by infusion. The patient remains on the following gtt: heparin, amiodarone and furosemide.   SUBJECTIVE: The patient is feeling fair on today. Denies having any chest pain at this time. His dyspnea has improved with the HF oxygen therapy and he is resting comfortably in bed on the cardiac monitor with his daughter at the bedside.   OBJECTIVE:   Vitals:   12/25/2021 0830 01/08/2022 0845 01/06/2022 0900 01/10/2022 0915  BP: (!) 91/42 (!) 99/42 (!) 100/43 (!) 92/39  Pulse: 77 80 81 78  Resp: 17 (!) 21 (!) 22 20  Temp:      TempSrc:      SpO2: 98% 99% 99% 97%  Weight:      Height:         Intake/Output Summary (Last 24 hours) at 01/02/2022 0956 Last data filed at 12/31/2021 0800 Gross per 24 hour  Intake 516.29 ml  Output 800 ml  Net -283.71 ml      PHYSICAL EXAM  General: Well developed, well nourished, in no acute distress HEENT:  Normocephalic and atraumatic.  PERRL Neck:   No obviouse JVD. +2 pulses, negative for bruit Lungs: diminished bilaterally to auscultation. Chest expansion symmetrical. No wheezes, rales or rhonchi.  Heart: HRRR . Normal S1 and S2 without gallops. Grade II/VI systolic murmur.  Abdomen: Bowel sounds are positive, abdomen soft and non-tender  Msk:  Normal tone for age. Decreased strength Extremities: +1 pitting BLE edema. No clubbing or cyanosis.  Neuro: Alert and oriented X 3. Psych: flat affect, responds appropriately   LABS: Basic Metabolic Panel: Recent Labs    01/20/22 0642 01/04/2022 0519  NA 141 142  K 4.6 4.9  CL 110 109  CO2 23 21*  GLUCOSE 101* 138*  BUN 86* 93*  CREATININE 1.77* 1.91*  CALCIUM 8.0* 7.6*  MG  --  2.7*   Liver Function Tests: Recent Labs    01/09/2022 1820 12/27/2021 0519  AST 32 45*  ALT 20 30  ALKPHOS 105 97  BILITOT 0.8 0.8  PROT 6.9 6.4*  ALBUMIN 3.1* 2.8*   No results for input(s): "LIPASE", "AMYLASE" in the last 72 hours. CBC: Recent Labs    01/20/22 0642 01/22/2022 0519  WBC 9.3 10.3  HGB 13.2 12.7*  HCT 40.2 39.2  MCV 95.7 95.4  PLT 119* 145*   Cardiac Enzymes: No results for input(s): "CKTOTAL", "CKMB", "CKMBINDEX", "TROPONINI" in the last 72 hours. BNP: Invalid input(s): "POCBNP" D-Dimer: No results for input(s): "DDIMER" in the last 72 hours. Hemoglobin A1C: No results  for input(s): "HGBA1C" in the last 72 hours. Fasting Lipid Panel: No results for input(s): "CHOL", "HDL", "LDLCALC", "TRIG", "CHOLHDL", "LDLDIRECT" in the last 72 hours. Thyroid Function Tests: Recent Labs    01/02/2022 1820  TSH 1.389   Anemia Panel: No results for input(s): "VITAMINB12", "FOLATE", "FERRITIN", "TIBC", "IRON", "RETICCTPCT" in the last 72 hours.  DG Chest Port 1 View  Result Date: 01/07/2022 CLINICAL DATA:  Chest pain and shortness of breath EXAM: PORTABLE CHEST 1 VIEW COMPARISON:  Radiographs 01/07/2022 FINDINGS: Stable cardiomediastinal silhouette. Aortic  atherosclerotic calcification. Sternotomy. Increased bilateral diffuse hazy interstitial and airspace opacities greatest in the lower lungs. Increased bilateral layering pleural effusions. No acute osseous abnormality. IMPRESSION: Findings compatible with worsening congestive heart failure. Electronically Signed   By: Placido Sou M.D.   On: 01/22/2022 03:25   ECHOCARDIOGRAM COMPLETE  Result Date: 01/20/2022    ECHOCARDIOGRAM REPORT   Patient Name:   [name] Date of Exam: 01/20/2022 Medical Rec #:  [id number]         Height:       68.0 in Accession #:    [id number]        Weight:       182.1 lb Date of Birth:  Jun 14, 1945        BSA:          1.964 m Patient Age:    66 years          BP:           119/36 mmHg Patient Gender: M                 HR:           101 bpm. Exam Location:  [facility] Procedure: 2D Echo, Color Doppler and Cardiac Doppler Indications:     R06.00 Dyspnea; Elevated troponin  History:         Patient has prior history of Echocardiogram examinations, most                  recent 07/18/2020. CHF, CAD, COPD and PVD; Risk Factors:Current                  Smoker, Hypertension and Dyslipidemia.  Sonographer:     Charmayne Sheer Referring Phys:  [id number] AMY N COX Diagnosing Phys: Yolonda Kida MD IMPRESSIONS  1. Ant/Apical/Lateral Hypo.  2. EF=35-40%.  3. Left ventricular ejection fraction, by estimation, is 35 to 40%. The left ventricle has moderately decreased function. The left ventricle demonstrates regional wall motion abnormalities (see scoring diagram/findings for description). The left ventricular internal cavity size was moderately dilated. There is mild asymmetric left ventricular hypertrophy of the septal segment. Left ventricular diastolic parameters are consistent with Grade III diastolic dysfunction (restrictive).  4. Right ventricular systolic function is low normal. The right ventricular size is mildly enlarged. Mildly increased right ventricular wall thickness.  5. Left atrial  size was mild to moderately dilated.  6. Right atrial size was mildly dilated.  7. The mitral valve is grossly normal. Mild mitral valve regurgitation.  8. Tricuspid valve regurgitation is mild to moderate.  9. The aortic valve is calcified. There is moderate calcification of the aortic valve. There is moderate thickening of the aortic valve. Aortic valve regurgitation is mild to moderate. Mild aortic valve stenosis. Conclusion(s)/Recommendation(s): Findings consistent with ischemic cardiomyopathy. FINDINGS  Left Ventricle: Left ventricular ejection fraction, by estimation, is 35 to 40%. The left ventricle has moderately decreased function. The left ventricle demonstrates  regional wall motion abnormalities. The left ventricular internal cavity size was moderately dilated. There is mild asymmetric left ventricular hypertrophy of the septal segment. Left ventricular diastolic parameters are consistent with Grade III diastolic dysfunction (restrictive). Right Ventricle: The right ventricular size is mildly enlarged. Mildly increased right ventricular wall thickness. Right ventricular systolic function is low normal. Left Atrium: Left atrial size was mild to moderately dilated. Right Atrium: Right atrial size was mildly dilated. Pericardium: There is no evidence of pericardial effusion. Mitral Valve: The mitral valve is grossly normal. Mild mitral valve regurgitation. Tricuspid Valve: The tricuspid valve is grossly normal. Tricuspid valve regurgitation is mild to moderate. Aortic Valve: The aortic valve is calcified. There is moderate calcification of the aortic valve. There is moderate thickening of the aortic valve. There is mild to moderate aortic valve annular calcification. Aortic valve regurgitation is mild to moderate. Mild aortic stenosis is present. Aortic valve mean gradient measures 13.5 mmHg. Aortic valve peak gradient measures 24.5 mmHg. Aortic valve area, by VTI measures 1.81 cm. Pulmonic Valve: The  pulmonic valve was normal in structure. Pulmonic valve regurgitation is not visualized. Aorta: The ascending aorta was not well visualized. IAS/Shunts: No atrial level shunt detected by color flow Doppler. Additional Comments: Ant/Apical/Lateral Hypo. EF=35-40%.  LEFT VENTRICLE PLAX 2D LVIDd:         5.40 cm   Diastology LVIDs:         3.90 cm   LV e' medial:    8.05 cm/s LV PW:         1.10 cm   LV E/e' medial:  16.1 LV IVS:        1.50 cm   LV e' lateral:   10.10 cm/s LVOT diam:     2.00 cm   LV E/e' lateral: 12.9 LV SV:         56 LV SV Index:   29 LVOT Area:     3.14 cm  RIGHT VENTRICLE RV Basal diam:  4.00 cm RV S prime:     5.55 cm/s LEFT ATRIUM             Index        RIGHT ATRIUM           Index LA diam:        4.50 cm 2.29 cm/m   RA Area:     12.80 cm LA Vol (A2C):   57.5 ml 29.28 ml/m  RA Volume:   29.50 ml  15.02 ml/m LA Vol (A4C):   52.1 ml 26.53 ml/m LA Biplane Vol: 59.3 ml 30.19 ml/m  AORTIC VALVE                     PULMONIC VALVE AV Area (Vmax):    1.52 cm      PV Vmax:       0.73 m/s AV Area (Vmean):   1.58 cm      PV Peak grad:  2.1 mmHg AV Area (VTI):     1.81 cm AV Vmax:           247.50 cm/s AV Vmean:          163.000 cm/s AV VTI:            0.312 m AV Peak Grad:      24.5 mmHg AV Mean Grad:      13.5 mmHg LVOT Vmax:         120.00 cm/s LVOT Vmean:  82.100 cm/s LVOT VTI:          0.179 m LVOT/AV VTI ratio: 0.57  AORTA Ao Root diam: 3.00 cm MITRAL VALVE MV Area (PHT): 6.27 cm     SHUNTS MV Decel Time: 121 msec     Systemic VTI:  0.18 m MV E velocity: 130.00 cm/s  Systemic Diam: 2.00 cm MV A velocity: 55.70 cm/s MV E/A ratio:  2.33 Dwayne D Callwood MD Electronically signed by Yolonda Kida MD Signature Date/Time: 01/20/2022/1:06:32 PM    Final    DG Chest 2 View  Result Date: 12/31/2021 CLINICAL DATA:  Shortness of breath, intermittent chest pain and weakness for 6 weeks. EXAM: CHEST - 2 VIEW COMPARISON:  None available FINDINGS: Previous median sternotomy and CABG  procedure. Heart size appears normal. Aortic atherosclerotic calcifications. There is a small right pleural effusion. Diffuse increase interstitial markings are identified throughout both lungs. There is platelike atelectasis in the right base. No airspace consolidation. IMPRESSION: Small right pleural effusion and diffuse increased interstitial markings compatible with congestive heart failure. Electronically Signed   By: Kerby Moors M.D.   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)   Tobacco abuse   Hypotension   Essential hypertension   Elevated troponin   Chronic back pain   Anxiety and depression   Polypharmacy   Chronic constipation   BPH (benign prostatic hyperplasia)   Acute hypoxemic respiratory failure (HCC)    # fluid volume overload likely secondary to acute systolic CHF and ischemic cardiomyopathy # hypotension # acute respiratory failure with hypoxia  The patient's is in fluid volume overload with labs confirming elevated BNP >4,000 and CXR revealing a small right pleural effusion and diffuse increased interstitial markings compatible with congestive heart failure. This was further confirmed by his inpatient echocardiogram  which was significant for anterior, apical & lateral hypokineses with an estimated EF of 35-40% suggestive of ischemic cardiomyopathy. This is new for the patient as his outpatient echocardiogram from 11/18/21 revealed normal LV function and moderate LVH, an estimated EF >55%,moderate mitral regurgitation and prosthetic aortic valve with good function. Echo from 2020 notable for grade 2 abnormal relaxation. Diuresis had been limited due to hypotension  and worsening AKI. A cardiac catheterization was considered, but deferred per patient request and due to worsening AKI and hemodynamic instability. He was started on a heparin gtt overnight.               - Cath deferred at this time.               - continue IV lasix gtt for diuresis with close monitoring of BP.               - continue to hold lisinopril and metoprolol at this time.              - strict I&O's.              - daily weights.              - continue HF oxygen. RT evaluation appreciated.    # s/p AVR, stable  # Moderate mitral regurgitation,  chronic, stable Echo as above. These are likely contributing factors, but less likely to be the primary cause of fluid volume overload.              - management as above.    # CAD, multivessel s/p CABG x2 x2 (SVG-PDA, SVG-OM 2) in 2013  # elevated troponin due to NSTEMI # tobacco use  The patient is without chest pain at this time. Was given 3 doses of SL nitro overnight with successful resolution of symptoms.  Repeat troponin slightly elevated at now at 840; (initial troponin was 641>> second was 679) likely secondary to NSTEMI. Echo findings as above.              - cardiac cath deferred as above.   - heparin gtt as above.   - heart healthy diet.              - continue aspirin and atorvastatin.             - hold lisinopril and metoprolol due to hypotension and AKI.              - continue nicotine patch.  - recommend tobacco cessation.   # atrial fibrillation with RVR The patient went into a-fib with RVR overnight that was treated with amiodarone load and infusion which successfully converted him back to NSR. The patient has a CHADVASc of 3. He continues to be in SR with a HR in this 70's at this time.   - continue heparin gtt as above.   - continue amiodarone gtt.   - hold beta blocker.    # AKI Appears new for the patient. His creatinine from 12/10/21 was normal at 1.0 (BUN= 21), then elevated at 2.1 (BUN =92) on 01/18/22  and upon admission 1.98>> 1.77 with a BUN of 95>> 86. Repeat creat/Bun this morning is 86/1.77.              - Nephrology has been consulted.    Lamar, ACNPC-AG  01/20/2022 9:56 AM

## 2022-01-21 NOTE — Procedures (Signed)
Central Venous Catheter Insertion Procedure Note  [name]  [id number]  [birth year]  Date:01/07/2022  Time:1:09 PM   Provider Performing:Laira Penninger D [name]   Procedure: Insertion of Non-tunneled Central Venous [phone]) with US guidance ([id number])   Indication(s) Medication administration and Difficult access  Consent Risks of the procedure as well as the alternatives and risks of each were explained to the patient and/or caregiver.  Consent for the procedure was obtained and is signed in the bedside chart  Anesthesia Topical only with 1% lidocaine   Timeout Verified patient identification, verified procedure, site/side was marked, verified correct patient position, special equipment/implants available, medications/allergies/relevant history reviewed, required imaging and test results available.  Sterile Technique Maximal sterile technique including full sterile barrier drape, hand hygiene, sterile gown, sterile gloves, mask, hair covering, sterile ultrasound probe cover (if used).  Procedure Description Area of catheter insertion was cleaned with chlorhexidine and draped in sterile fashion.  With real-time ultrasound guidance a central venous catheter was placed into the left internal jugular vein. Nonpulsatile blood flow and easy flushing noted in all ports.  The catheter was sutured in place and sterile dressing applied.  Complications/Tolerance None; patient tolerated the procedure well. Chest X-ray is ordered to verify placement for internal jugular or subclavian cannulation.   Chest x-ray is not ordered for femoral cannulation.  EBL Minimal  Specimen(s) None   Line secured at the 20 cm mark. BIOPATCH applied to the insertion site.   Darel Hong, AGACNP-BC [facility] Prefer epic messenger for cross cover needs If after hours, please call E-link

## 2022-01-21 NOTE — Assessment & Plan Note (Addendum)
Likely cardiorenal syndrome Plan: --consult nephrology today --cont lasix gtt

## 2022-01-21 NOTE — Progress Notes (Signed)
ABG obtained and resulted. See Results tab.

## 2022-01-21 NOTE — Progress Notes (Addendum)
Spoke with Andi Hence, RN for pt. Regarding PICC placement. RN stated pt currently has PIV access until the PICC can be placed today, and consent will be obtained per wife.

## 2022-01-21 NOTE — Procedures (Signed)
Arterial Catheter Insertion Procedure Note  [name]  [id number]  June 14, 1945  Date:01/03/2022  Time:10:35 PM   Provider Performing: Karen Kays   Procedure: Insertion of Arterial Line [phone]) with US guidance ([id number])   Indication(s) Blood pressure monitoring and/or need for frequent ABGs  Consent Unable to obtain consent due to emergent nature of procedure.  Anesthesia None  Time Out Verified patient identification, verified procedure, site/side was marked, verified correct patient position, special equipment/implants available, medications/allergies/relevant history reviewed, required imaging and test results available.  Sterile Technique Maximal sterile technique including full sterile barrier drape, hand hygiene, sterile gown, sterile gloves, mask, hair covering, sterile ultrasound probe cover (if used).  Procedure Description Area of catheter insertion was cleaned with chlorhexidine and draped in sterile fashion. With real-time ultrasound guidance an arterial catheter was placed into the right radial artery.  Appropriate arterial tracings confirmed on monitor.    Complications/Tolerance None; patient tolerated the procedure well.  EBL Minimal  Specimen(s) None  Rufina Falco, DNP, CCRN, FNP-C, AGACNP-BC Acute Care & Family Nurse Practitioner  [facility]  See Amion for personal pager PCCM on call pager [phone] until 7 am

## 2022-01-21 NOTE — Consult Note (Signed)
[facility]  CONSULT NOTE    Date: 12/29/2021                  Patient Name:  [name]  MRN: [id number]  DOB: 1945-06-27  Age / Sex: 76 y.o., male         PCP: Idelle Crouch, MD                 Service Requesting Consult: Cardiology: [facility]                 Reason for Consult: Acute kidney injury            History of Present Illness: [name] is a 76 y.o.  male with past medical history including BPH, hypertension, mitral valve regurgitation, anxiety and depression, hyperlipidemia, CAD with CABG, atrial fibrillation, non-Hodgkin's lymphoma, and chronic back pain, who was admitted to [facility] on 12/26/2021 for NSTEMI (non-ST elevated myocardial infarction) ([place]) [I21.4] Acute exacerbation of CHF (congestive heart failure) ([place]) [I50.9] Acute congestive heart failure, unspecified heart failure type ([place]) [I50.9]  Patient presented with progressive shortness of breath for 6 weeks.  Patient also stated he had intermittent chest pain during that time.  Patient is seen and evaluated at bedside in ICU.  Patient is alert and oriented.  Currently on high flow nasal cannula, denies shortness of breath at this time.  Per chart review patient had A-fib with RVR and was placed on amiodarone and heparin drip.  Lower extremity edema present, patient currently on Lasix drip.  Patient has had some intermittent hypotension, levo ordered if needed.  Labs on ED arrival significant for glucose 173, BUN 95, creatinine 1.98 with GFR 35, albumin 3.1, troponin 641, BNP greater than 4100.  Chest x-ray shows small right pleural effusion with some congestive heart failure.  Renal panel negative for COVID-19.   Medications: Outpatient medications: Medications Prior to Admission  Medication Sig Dispense Refill Last Dose   atorvastatin (LIPITOR) 20 MG tablet Take 20 mg by mouth daily.    01/18/2022 at 1800   busPIRone (BUSPAR) 15 MG tablet Take 15 mg by mouth 3 (three)  times daily.    01/03/2022 at 0800   citalopram (CELEXA) 20 MG tablet Take 20 mg by mouth daily.   01/10/2022 at 0800   diazepam (VALIUM) 5 MG tablet One pill 45-60 mins prior to scan; 1 pill 15 mins prior if needed. (Patient taking differently: Take 5 mg by mouth every 6 (six) hours as needed. One pill 45-60 mins prior to scan; 1 pill 15 mins prior if needed.) 3 tablet 0 prn at prn   fentaNYL (DURAGESIC) 100 MCG/HR 1 patch every other day.   01/13/2022   furosemide (LASIX) 40 MG tablet Take 40 mg by mouth daily.   01/16/2022 at 0800   gemfibrozil (LOPID) 600 MG tablet Take 600 mg by mouth 2 (two) times daily before a meal.   12/28/2021 at 0800   lisinopril (PRINIVIL,ZESTRIL) 10 MG tablet Take 30 mg by mouth daily. (take 10 mg in the am and 20 mg in the evening to equal 30 mg daily)   01/05/2022 at 0800   metoprolol tartrate (LOPRESSOR) 25 MG tablet Take 25 mg by mouth 2 (two) times daily.   12/28/2021 at 0800   tamsulosin (FLOMAX) 0.4 MG CAPS capsule Take 0.4 mg by mouth daily.   01/18/2022   butalbital-aspirin-caffeine (FIORINAL) 50-325-40 MG capsule Take 1 capsule by mouth every 6 (six) hours as  needed for headache.    prn at prn   docusate sodium (COLACE) 100 MG capsule Take 100 mg by mouth 3 (three) times daily.   prn at prn   levofloxacin (LEVAQUIN) 500 MG tablet Take 500 mg by mouth daily. (Patient not taking: Reported on 01/22/2022)   Completed Course   Oxycodone HCl 10 MG TABS Take 10 mg by mouth every 4 (four) hours as needed.   prn at prn   trolamine salicylate (ASPERCREME) 10 % cream Apply 1 Application topically as needed.   prn at prn    Current medications: Current Facility-Administered Medications  Medication Dose Route Frequency Provider Last Rate Last Admin   0.9 %  sodium chloride infusion  250 mL Intravenous Continuous Bradly Bienenstock, NP       acetaminophen (TYLENOL) tablet 650 mg  650 mg Oral Q6H PRN Cox, Amy N, DO       Or   acetaminophen (TYLENOL) suppository 650 mg  650 mg  Rectal Q6H PRN Cox, Amy N, DO       amiodarone (PACERONE) tablet 200 mg  200 mg Oral BID Callwood, Dwayne D, MD   200 mg at 01/10/2022 1114   atorvastatin (LIPITOR) tablet 20 mg  20 mg Oral Daily Cox, Amy N, DO   20 mg at 01/11/2022 1114   busPIRone (BUSPAR) tablet 15 mg  15 mg Oral TID Cox, Amy N, DO   15 mg at 01/18/2022 1114   Chlorhexidine Gluconate Cloth 2 % PADS 6 each  6 each Topical Q0600 Cox, Amy N, DO   6 each at 01/20/22 0752   citalopram (CELEXA) tablet 20 mg  20 mg Oral Daily Cox, Amy N, DO   20 mg at 01/01/2022 1114   diazepam (VALIUM) tablet 5 mg  5 mg Oral Q6H Enzo Bi, MD   5 mg at 12/26/2021 1114   docusate sodium (COLACE) capsule 100 mg  100 mg Oral BID Cox, Amy N, DO   100 mg at 01/07/2022 1114   fentaNYL (DURAGESIC) 100 MCG/HR 1 patch  1 patch Transdermal QODAY Cox, Amy N, DO       furosemide (LASIX) 200 mg in dextrose 5 % 100 mL (2 mg/mL) infusion  10 mg/hr Intravenous Continuous Sharion Settler, NP 5 mL/hr at 01/01/2022 0700 10 mg/hr at 01/22/2022 0700   gemfibrozil (LOPID) tablet 600 mg  600 mg Oral BID AC Cox, Amy N, DO   600 mg at 01/03/2022 0749   heparin ADULT infusion 100 units/mL (25000 units/262m)  1,000 Units/hr Intravenous Continuous DDorothe Pea RPH 10 mL/hr at 01/12/2022 0700 1,000 Units/hr at 01/22/2022 0700   hydrALAZINE (APRESOLINE) tablet 10 mg  10 mg Oral Q6H PRN Cox, Amy N, DO       midodrine (PROAMATINE) tablet 10 mg  10 mg Oral TID WC LEnzo Bi MD   10 mg at 01/08/2022 0742   milrinone (PRIMACOR) 20 MG/100 ML (0.2 mg/mL) infusion  0.25 mcg/kg/min Intravenous Continuous BColon Flattery NP   Held at 01/05/2022 1118   nicotine (NICODERM CQ - dosed in mg/24 hours) patch 21 mg  21 mg Transdermal Daily Cox, Amy N, DO   21 mg at 01/09/2022 1126   nitroGLYCERIN (NITROSTAT) SL tablet 0.4 mg  0.4 mg Sublingual Q5 min PRN LEnzo Bi MD   0.4 mg at 01/17/2022 0227   norepinephrine (LEVOPHED) '4mg'$  in 252m(0.016 mg/mL) premix infusion  2-10 mcg/min Intravenous Titrated KeBradly Bienenstock NP  ondansetron (ZOFRAN) tablet 4 mg  4 mg Oral Q6H PRN Cox, Amy N, DO       Or   ondansetron (ZOFRAN) injection 4 mg  4 mg Intravenous Q6H PRN Cox, Amy N, DO       Oral care mouth rinse  15 mL Mouth Rinse PRN Enzo Bi, MD       oxyCODONE (Oxy IR/ROXICODONE) immediate release tablet 5 mg  5 mg Oral Q4H PRN Enzo Bi, MD       senna-docusate (Senokot-S) tablet 1 tablet  1 tablet Oral QHS PRN Cox, Amy N, DO       sodium chloride flush (NS) 0.9 % injection 3 mL  3 mL Intravenous K53Z White, Delicia, NP   3 mL at 76/73/41 1119      Allergies: Allergies  Allergen Reactions   Celebrex [Celecoxib] Other (See Comments) and Shortness Of Breath     CHEST PAIN Chest pain   Carvedilol Other (See Comments)    Extreme fatigue   Penicillins Other (See Comments) and Rash    unknown      Past 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-sided sciatica    COPD (chronic obstructive pulmonary disease) (HCC)    DDD (degenerative disc disease), lumbar    Hyperlipidemia    Hypertension    Large B-cell lymphoma (HCC)    Pain syndrome, chronic    Pre-diabetes    PVD (peripheral vascular disease) (HCC)    Spondylosis, lumbar, with myelopathy    Tobacco abuse      Past Surgical History: Past Surgical History:  Procedure Laterality Date   cardiac stents     CORONARY ANGIOPLASTY     CORONARY ARTERY BYPASS GRAFT     VSD REPAIR       Family History: Family History  Problem Relation Age of Onset   Arthritis Mother    Heart disease Mother        died During open heart surgery   Early death Father    Heart disease Father        Coronary Artery Disease    Asthma Son    Thyroid disease Sister      Social History: Social History   Socioeconomic History   Marital status: Married    Spouse name: Not on file   Number  of children: Not on file   Years of education: Not on file   Highest education level: Not on file  Occupational History   Not on file  Tobacco Use   Smoking status: Every Day    Packs/day: 1.00    Years: 40.00    Total pack years: 40.00    Types: Cigarettes   Smokeless tobacco: Never   Tobacco comments:    6-8 cigarettes per day  Substance and Sexual Activity   Alcohol use: No    Alcohol/week: 0.0 standard drinks of alcohol   Drug use: No   Sexual activity: Yes  Other Topics Concern   Not on file  Social History Narrative   Not on file   Social Determinants of Health   Financial Resource Strain: Not on file  Food Insecurity: Not on file  Transportation Needs: Not on file  Physical Activity: Not on file  Stress: Not on file  Social Connections: Not on file  Intimate Partner Violence: Not on file     Review of Systems: Review of Systems  Constitutional:  Negative for chills, fever and malaise/fatigue.  HENT:  Negative for congestion and sore throat.   Eyes:  Negative for blurred vision and redness.  Respiratory:  Positive for shortness of breath. Negative for cough and wheezing.   Cardiovascular:  Positive for chest pain and leg swelling. Negative for palpitations and claudication.  Gastrointestinal:  Negative for abdominal pain, diarrhea, nausea and vomiting.  Genitourinary:  Negative for flank pain, frequency and hematuria.  Musculoskeletal:  Negative for back pain, falls and myalgias.  Skin:  Negative for rash.  Neurological:  Negative for dizziness, weakness and headaches.  Endo/Heme/Allergies:  Does not bruise/bleed easily.  Psychiatric/Behavioral:  Negative for depression. The patient is not nervous/anxious and does not have insomnia.     Vital Signs: Blood pressure (!) 113/45, pulse 78, temperature (!) 97.4 F (36.3 C), temperature source Oral, resp. rate (!) 23, height '5\' 8"'$  (1.727 m), weight 84.6 kg, SpO2 97 %.  Weight trends: Filed Weights   01/14/2022  1546 01/20/22 0217 01/05/2022 0645  Weight: 78 kg 82.6 kg 84.6 kg    Physical Exam: General: NAD  Head: Normocephalic, atraumatic. Moist oral mucosal membranes  Eyes: Anicteric  Lungs:  Diminished in bases, normal effort, HFNC  Heart: Regular rate and rhythm  Abdomen:  Soft, nontender  Extremities: 2+ peripheral edema.  Neurologic: Nonfocal, moving all four extremities  Skin: No lesions  Access: None     Lab results: Basic Metabolic Panel: Recent Labs  Lab 01/07/2022 1550 01/20/22 0642 12/30/2021 0519  NA 140 141 142  K 4.9 4.6 4.9  CL 109 110 109  CO2 22 23 21*  GLUCOSE 173* 101* 138*  BUN 95* 86* 93*  CREATININE 1.98* 1.77* 1.91*  CALCIUM 8.3* 8.0* 7.6*  MG  --   --  2.7*    Liver Function Tests: Recent Labs  Lab 01/09/2022 1820 01/15/2022 0519  AST 32 45*  ALT 20 30  ALKPHOS 105 97  BILITOT 0.8 0.8  PROT 6.9 6.4*  ALBUMIN 3.1* 2.8*   No results for input(s): "LIPASE", "AMYLASE" in the last 168 hours. No results for input(s): "AMMONIA" in the last 168 hours.  CBC: Recent Labs  Lab 01/11/2022 1550 01/20/22 0642 01/16/2022 0519  WBC 8.4 9.3 10.3  HGB 13.3 13.2 12.7*  HCT 41.8 40.2 39.2  MCV 96.5 95.7 95.4  PLT 149* 119* 145*    Cardiac Enzymes: No results for input(s): "CKTOTAL", "CKMB", "CKMBINDEX", "TROPONINI" in the last 168 hours.  BNP: Invalid input(s): "POCBNP"  CBG: Recent Labs  Lab 01/20/22 0212 01/01/2022 0754  GLUCAP 106* 114*    Microbiology: Results for orders placed or performed during the hospital encounter of 01/20/2022  SARS Coronavirus 2 by RT PCR (hospital order, performed in [facility] hospital lab) *cepheid single result test* Anterior Nasal Swab     Status: None   Collection Time: 01/20/22  1:05 AM   Specimen: Anterior Nasal Swab  Result Value Ref Range Status   SARS Coronavirus 2 by RT PCR NEGATIVE NEGATIVE Final    Comment: (NOTE) SARS-CoV-2 target nucleic acids are NOT DETECTED.  The SARS-CoV-2 RNA is generally detectable  in upper and lower respiratory specimens during the acute phase of infection. The lowest concentration of SARS-CoV-2 viral copies this assay can detect is 250 copies / mL. A negative result does not preclude SARS-CoV-2 infection and should not be used as the sole basis  for treatment or other patient management decisions.  A negative result may occur with improper specimen collection / handling, submission of specimen other than nasopharyngeal swab, presence of viral mutation(s) within the areas targeted by this assay, and inadequate number of viral copies (<250 copies / mL). A negative result must be combined with clinical observations, patient history, and epidemiological information.  Fact Sheet for Patients:   https://www.patel.info/  Fact Sheet for Healthcare Providers: https://hall.com/  This test is not yet approved or  cleared by the [place] FDA and has been authorized for detection and/or diagnosis of SARS-CoV-2 by FDA under an Emergency Use Authorization (EUA).  This EUA will remain in effect (meaning this test can be used) for the duration of the COVID-19 declaration under Section 564(b)(1) of the Act, 21 U.S.C. section 360bbb-3(b)(1), unless the authorization is terminated or revoked sooner.  Performed at [facility], [address] [place], [place] [address]   MRSA Next Gen by PCR, Nasal     Status: None   Collection Time: 01/20/22  2:20 AM   Specimen: Nasal Mucosa; Nasal Swab  Result Value Ref Range Status   MRSA by PCR Next Gen NOT DETECTED NOT DETECTED Final    Comment: (NOTE) The GeneXpert MRSA Assay (FDA approved for NASAL specimens only), is one component of a comprehensive MRSA colonization surveillance program. It is not intended to diagnose MRSA infection nor to guide or monitor treatment for MRSA infections. Test performance is not FDA approved in patients less than 68 years old. Performed at  [facility], [address] [place], [place] [address]     Coagulation Studies: No results for input(s): "LABPROT", "INR" in the last 72 hours.  Urinalysis: No results for input(s): "COLORURINE", "LABSPEC", "PHURINE", "GLUCOSEU", "HGBUR", "BILIRUBINUR", "KETONESUR", "PROTEINUR", "UROBILINOGEN", "NITRITE", "LEUKOCYTESUR" in the last 72 hours.  Invalid input(s): "APPERANCEUR"    Imaging: [place] EKG SITE RITE  Result Date: 01/15/2022 If Site Rite image not attached, placement could not be confirmed due to current cardiac rhythm.  [place] EKG SITE RITE  Result Date: 01/11/2022 If Site Rite image not attached, placement could not be confirmed due to current cardiac rhythm.  DG Chest Port 1 View  Result Date: 12/27/2021 CLINICAL DATA:  Chest pain and shortness of breath EXAM: PORTABLE CHEST 1 VIEW COMPARISON:  Radiographs 12/31/2021 FINDINGS: Stable cardiomediastinal silhouette. Aortic atherosclerotic calcification. Sternotomy. Increased bilateral diffuse hazy interstitial and airspace opacities greatest in the lower lungs. Increased bilateral layering pleural effusions. No acute osseous abnormality. IMPRESSION: Findings compatible with worsening congestive heart failure. Electronically Signed   By: Placido Sou M.D.   On: 01/17/2022 03:25   ECHOCARDIOGRAM COMPLETE  Result Date: 01/20/2022    ECHOCARDIOGRAM REPORT   Patient Name:   [name] Date of Exam: 01/20/2022 Medical Rec #:  [id number]         Height:       68.0 in Accession #:    [id number]        Weight:       182.1 lb Date of Birth:  [birth year]        BSA:          1.964 m Patient Age:    71 years          BP:           119/36 mmHg Patient Gender: M                 HR:  101 bpm. Exam Location:  [facility] Procedure: 2D Echo, Color Doppler and Cardiac Doppler Indications:     R06.00 Dyspnea; Elevated troponin  History:         Patient has prior history of Echocardiogram examinations, most                  recent  07/18/2020. CHF, CAD, COPD and PVD; Risk Factors:Current                  Smoker, Hypertension and Dyslipidemia.  Sonographer:     Charmayne Sheer Referring Phys:  [id number] AMY N COX Diagnosing Phys: Yolonda Kida MD IMPRESSIONS  1. Ant/Apical/Lateral Hypo.  2. EF=35-40%.  3. Left ventricular ejection fraction, by estimation, is 35 to 40%. The left ventricle has moderately decreased function. The left ventricle demonstrates regional wall motion abnormalities (see scoring diagram/findings for description). The left ventricular internal cavity size was moderately dilated. There is mild asymmetric left ventricular hypertrophy of the septal segment. Left ventricular diastolic parameters are consistent with Grade III diastolic dysfunction (restrictive).  4. Right ventricular systolic function is low normal. The right ventricular size is mildly enlarged. Mildly increased right ventricular wall thickness.  5. Left atrial size was mild to moderately dilated.  6. Right atrial size was mildly dilated.  7. The mitral valve is grossly normal. Mild mitral valve regurgitation.  8. Tricuspid valve regurgitation is mild to moderate.  9. The aortic valve is calcified. There is moderate calcification of the aortic valve. There is moderate thickening of the aortic valve. Aortic valve regurgitation is mild to moderate. Mild aortic valve stenosis. Conclusion(s)/Recommendation(s): Findings consistent with ischemic cardiomyopathy. FINDINGS  Left Ventricle: Left ventricular ejection fraction, by estimation, is 35 to 40%. The left ventricle has moderately decreased function. The left ventricle demonstrates regional wall motion abnormalities. The left ventricular internal cavity size was moderately dilated. There is mild asymmetric left ventricular hypertrophy of the septal segment. Left ventricular diastolic parameters are consistent with Grade III diastolic dysfunction (restrictive). Right Ventricle: The right ventricular size is mildly  enlarged. Mildly increased right ventricular wall thickness. Right ventricular systolic function is low normal. Left Atrium: Left atrial size was mild to moderately dilated. Right Atrium: Right atrial size was mildly dilated. Pericardium: There is no evidence of pericardial effusion. Mitral Valve: The mitral valve is grossly normal. Mild mitral valve regurgitation. Tricuspid Valve: The tricuspid valve is grossly normal. Tricuspid valve regurgitation is mild to moderate. Aortic Valve: The aortic valve is calcified. There is moderate calcification of the aortic valve. There is moderate thickening of the aortic valve. There is mild to moderate aortic valve annular calcification. Aortic valve regurgitation is mild to moderate. Mild aortic stenosis is present. Aortic valve mean gradient measures 13.5 mmHg. Aortic valve peak gradient measures 24.5 mmHg. Aortic valve area, by VTI measures 1.81 cm. Pulmonic Valve: The pulmonic valve was normal in structure. Pulmonic valve regurgitation is not visualized. Aorta: The ascending aorta was not well visualized. IAS/Shunts: No atrial level shunt detected by color flow Doppler. Additional Comments: Ant/Apical/Lateral Hypo. EF=35-40%.  LEFT VENTRICLE PLAX 2D LVIDd:         5.40 cm   Diastology LVIDs:         3.90 cm   LV e' medial:    8.05 cm/s LV PW:         1.10 cm   LV E/e' medial:  16.1 LV IVS:        1.50 cm   LV e' lateral:  10.10 cm/s LVOT diam:     2.00 cm   LV E/e' lateral: 12.9 LV SV:         56 LV SV Index:   29 LVOT Area:     3.14 cm  RIGHT VENTRICLE RV Basal diam:  4.00 cm RV S prime:     5.55 cm/s LEFT ATRIUM             Index        RIGHT ATRIUM           Index LA diam:        4.50 cm 2.29 cm/m   RA Area:     12.80 cm LA Vol (A2C):   57.5 ml 29.28 ml/m  RA Volume:   29.50 ml  15.02 ml/m LA Vol (A4C):   52.1 ml 26.53 ml/m LA Biplane Vol: 59.3 ml 30.19 ml/m  AORTIC VALVE                     PULMONIC VALVE AV Area (Vmax):    1.52 cm      PV Vmax:       0.73 m/s  AV Area (Vmean):   1.58 cm      PV Peak grad:  2.1 mmHg AV Area (VTI):     1.81 cm AV Vmax:           247.50 cm/s AV Vmean:          163.000 cm/s AV VTI:            0.312 m AV Peak Grad:      24.5 mmHg AV Mean Grad:      13.5 mmHg LVOT Vmax:         120.00 cm/s LVOT Vmean:        82.100 cm/s LVOT VTI:          0.179 m LVOT/AV VTI ratio: 0.57  AORTA Ao Root diam: 3.00 cm MITRAL VALVE MV Area (PHT): 6.27 cm     SHUNTS MV Decel Time: 121 msec     Systemic VTI:  0.18 m MV E velocity: 130.00 cm/s  Systemic Diam: 2.00 cm MV A velocity: 55.70 cm/s MV E/A ratio:  2.33 Dwayne D Callwood MD Electronically signed by Yolonda Kida MD Signature Date/Time: 01/20/2022/1:06:32 PM    Final    DG Chest 2 View  Result Date: 01/05/2022 CLINICAL DATA:  Shortness of breath, intermittent chest pain and weakness for 6 weeks. EXAM: CHEST - 2 VIEW COMPARISON:  None available FINDINGS: Previous median sternotomy and CABG procedure. Heart size appears normal. Aortic atherosclerotic calcifications. There is a small right pleural effusion. Diffuse increase interstitial markings are identified throughout both lungs. There is platelike atelectasis in the right base. No airspace consolidation. IMPRESSION: Small right pleural effusion and diffuse increased interstitial markings compatible with congestive heart failure. Electronically Signed   By: Kerby Moors M.D.   On: 12/29/2021 16:24     Assessment & Plan: [name] is a 76 y.o.  male with past medical history including BPH, hypertension, mitral valve regurgitation, anxiety and depression, hyperlipidemia, CAD with CABG, atrial fibrillation, non-Hodgkin's lymphoma, and chronic back pain, who was admitted to [facility] on 01/17/2022 for NSTEMI (non-ST elevated myocardial infarction) ([place]) [I21.4] Acute exacerbation of CHF (congestive heart failure) ([place]) [I50.9] Acute congestive heart failure, unspecified heart failure type ([place]) [I50.9]   Acute kidney injury on chronic  kidney disease stage II.  Baseline creatinine 1.0 with GFR  73 on 12/10/2021.  Acute kidney injury secondary to cardiorenal syndrome.  No IV contrast exposure.  Home diuresis held in place of furosemide drip.  Adequate urine output recorded.  Creatinine on admission 1.98.  BUN elevated, 95.  No acute need for dialysis at this time.  Continue to diurese with furosemide drip.  Avoid nephrotoxic agents and therapies if possible.  2.  Acute on chronic systolic heart failure.  Elevated BNP and chest x-ray with small pleural effusions.  Echo completed on 01/20/2022 shows EF 35 to 40% with a mild asymmetric LVH and grade 3 diastolic dysfunction.  Also showing a moderate calcification of the aortic valve.  We will continue furosemide drip.  Patient currently on amiodarone started overnight.  We will stop this and transition to milrinone.  Team may place PICC for require treatment.  3.  Acute respiratory failure likely due to volume overload.  Patient receiving efficient diuresis with furosemide drip, 800 mL recorded overnight.  Remains on high flow nasal cannula, slowly weaning.    LOS: 2   9/28/202311:27 AM

## 2022-01-21 NOTE — Progress Notes (Signed)
Patient alert and oriented x4 at bedside this morning. Multiple contacts with MD regarding blood pressures, gave medications per orders. Spoke with cardiology at bedside and nephrology regarding patient and plan of care. ICU notified of patient status, gave medications per new orders.   Patient in bed, with agonal breaths, MD notified. Interventions performed per orders. MD and NP obtained verbal consent for intubation and central line placement with patient's spouse and son.  Patient was intubated and central line placed. Patient tolerated well. Family updated at bedside.  Patient currently NSR, with improved pressures. Patient able to follow commands. Family updated via phone.

## 2022-01-21 NOTE — IPAL (Signed)
GOALS OF CARE FAMILY CONFERENCE   Current clinical status, hospital findings and medical plan was reviewed with family.   Updated and notified of patients ongoing immediate critical medical problems.   Patient remains critically ill with circulatory shock and severe hypoxemia with agonal breathing    Patient is unable to breathe independently, unable to protect airway and unable to mobilize secretions.    Explained to family course of therapy and the modalities   Patient with Progressive multiorgan failure with high probability of a very minimal chance of meaningful recovery despite aggressive and optimal medical therapy.   Family is appreciative of care and relate understanding that patient is severely critically ill with anticipation of passing away during this hospitalization.   They have consented and agreed to DNR but they doe want intubation temporarily to attempt reversing any treatable pathology  Code status has been changed to DNR  Family are satisfied with Plan of action and management. All questions answered  Additional Critical Care time 35 mins    Ottie Glazier, M.D.  Pulmonary & [place]

## 2022-01-22 ENCOUNTER — Inpatient Hospital Stay (HOSPITAL_COMMUNITY): Admit: 2022-01-22 | Payer: Medicare Other | Admitting: Critical Care Medicine

## 2022-01-22 ENCOUNTER — Encounter (HOSPITAL_COMMUNITY): Payer: Self-pay

## 2022-01-22 ENCOUNTER — Inpatient Hospital Stay: Payer: Medicare Other

## 2022-01-22 DIAGNOSIS — E440 Moderate protein-calorie malnutrition: Secondary | ICD-10-CM | POA: Insufficient documentation

## 2022-01-22 LAB — GLUCOSE, CAPILLARY
Glucose-Capillary: 121 mg/dL — ABNORMAL HIGH (ref 70–99)
Glucose-Capillary: 105 mg/dL — ABNORMAL HIGH (ref 70–99)

## 2022-01-22 LAB — HEPARIN LEVEL (UNFRACTIONATED)
Heparin Unfractionated: 0.17 IU/mL — ABNORMAL LOW (ref 0.30–0.70)
Heparin Unfractionated: 0.48 IU/mL (ref 0.30–0.70)
Heparin Unfractionated: 0.48 IU/mL (ref 0.30–0.70)

## 2022-01-22 LAB — BASIC METABOLIC PANEL
Anion gap: 10 (ref 5–15)
BUN: 94 mg/dL — ABNORMAL HIGH (ref 8–23)
CO2: 22 mmol/L (ref 22–32)
Calcium: 7.5 mg/dL — ABNORMAL LOW (ref 8.9–10.3)
Chloride: 108 mmol/L (ref 98–111)
Creatinine, Ser: 2.45 mg/dL — ABNORMAL HIGH (ref 0.61–1.24)
GFR, Estimated: 27 mL/min — ABNORMAL LOW (ref >60)
Glucose, Bld: 124 mg/dL — ABNORMAL HIGH (ref 70–99)
Potassium: 4.6 mmol/L (ref 3.5–5.1)
Sodium: 140 mmol/L (ref 135–145)

## 2022-01-22 LAB — CBC
HCT: 38.2 % — ABNORMAL LOW (ref 39.0–52.0)
Hemoglobin: 12.4 g/dL — ABNORMAL LOW (ref 13.0–17.0)
MCH: 30.8 pg (ref 26.0–34.0)
MCHC: 32.5 g/dL (ref 30.0–36.0)
MCV: 95 fL (ref 80.0–100.0)
Platelets: 151 10*3/uL (ref 150–400)
RBC: 4.02 MIL/uL — ABNORMAL LOW (ref 4.22–5.81)
RDW: 16.9 % — ABNORMAL HIGH (ref 11.5–15.5)
WBC: 12.1 10*3/uL — ABNORMAL HIGH (ref 4.0–10.5)
nRBC: 0.2 % (ref 0.0–0.2)

## 2022-01-22 LAB — TRIGLYCERIDES: Triglycerides: 76 mg/dL (ref <150)

## 2022-01-22 LAB — MAGNESIUM: Magnesium: 2.9 mg/dL — ABNORMAL HIGH (ref 1.7–2.4)

## 2022-01-22 MED ORDER — SODIUM CHLORIDE 0.9 % IV SOLN: 0.5000 mg/h | INTRAVENOUS | 0 refills | Status: AC

## 2022-01-22 MED ORDER — FENTANYL 100 MCG/HR TD PT72: 1.0000 | MEDICATED_PATCH | TRANSDERMAL | 0 refills | Status: AC

## 2022-01-22 MED ORDER — DOBUTAMINE IN D5W 4-5 MG/ML-% IV SOLN
2.5000 ug/kg/min | INTRAVENOUS | Status: DC
  Administered 2022-01-22: 2.5 ug/kg/min via INTRAVENOUS
  Filled 2022-01-22: qty 250

## 2022-01-22 MED ORDER — DIAZEPAM 5 MG/ML IJ SOLN: 5.0000 mg | Freq: Four times a day (QID) | INTRAMUSCULAR | Status: AC

## 2022-01-22 MED ORDER — ORAL CARE MOUTH RINSE: 15.0000 mL | OROMUCOSAL | 0 refills | Status: AC | PRN

## 2022-01-22 MED ORDER — DIAZEPAM 5 MG/ML IJ SOLN
5.0000 mg | Freq: Four times a day (QID) | INTRAMUSCULAR | Status: DC
  Administered 2022-01-22 – 2022-01-23 (×3): 5 mg via INTRAVENOUS
  Filled 2022-01-22 (×3): qty 2

## 2022-01-22 MED ORDER — NOREPINEPHRINE 16 MG/250ML-% IV SOLN: 0.0000 ug/min | INTRAVENOUS | Status: AC

## 2022-01-22 MED ORDER — ORAL CARE MOUTH RINSE: 15.0000 mL | OROMUCOSAL | 0 refills | Status: AC

## 2022-01-22 MED ORDER — PROSOURCE TF20 ENFIT COMPATIBL EN LIQD: 60.0000 mL | Freq: Every day | ENTERAL | Status: AC

## 2022-01-22 MED ORDER — FENTANYL 100 MCG/HR TD PT72
1.0000 | MEDICATED_PATCH | TRANSDERMAL | Status: DC
  Administered 2022-01-22: 1 via TRANSDERMAL
  Filled 2022-01-22 (×2): qty 1

## 2022-01-22 MED ORDER — CHLORHEXIDINE GLUCONATE CLOTH 2 % EX PADS: 6.0000 | MEDICATED_PAD | Freq: Every day | CUTANEOUS | Status: AC

## 2022-01-22 MED ORDER — PROPOFOL 1000 MG/100ML IV EMUL: 5.0000 ug/kg/min | INTRAVENOUS | Status: AC

## 2022-01-22 MED ORDER — DIAZEPAM 5 MG PO TABS: 5.0000 mg | ORAL_TABLET | Freq: Four times a day (QID) | ORAL | Status: DC

## 2022-01-22 MED ORDER — NICOTINE 21 MG/24HR TD PT24: 21.0000 mg | MEDICATED_PATCH | Freq: Every day | TRANSDERMAL | 0 refills | Status: AC

## 2022-01-22 MED ORDER — HYDROMORPHONE HCL 1 MG/ML IJ SOLN
0.5000 mg | Freq: Once | INTRAMUSCULAR | Status: AC
  Administered 2022-01-22: 0.5 mg via INTRAVENOUS
  Filled 2022-01-22: qty 1

## 2022-01-22 MED ORDER — FREE WATER
30.0000 mL | Status: DC
  Administered 2022-01-22 – 2022-01-23 (×3): 30 mL

## 2022-01-22 MED ORDER — HYDROMORPHONE BOLUS VIA INFUSION
0.2500 mg | INTRAVENOUS | Status: DC | PRN
  Administered 2022-01-23: 1 mg via INTRAVENOUS

## 2022-01-22 MED ORDER — PROSOURCE TF20 ENFIT COMPATIBL EN LIQD
60.0000 mL | Freq: Every day | ENTERAL | Status: DC
  Filled 2022-01-22: qty 60

## 2022-01-22 MED ORDER — MIDAZOLAM HCL 2 MG/2ML IJ SOLN: 2.0000 mg | INTRAMUSCULAR | 0 refills | Status: AC | PRN

## 2022-01-22 MED ORDER — MIDAZOLAM HCL 2 MG/2ML IJ SOLN
2.0000 mg | INTRAMUSCULAR | Status: DC | PRN
  Administered 2022-01-23 (×4): 2 mg via INTRAVENOUS
  Filled 2022-01-22 (×4): qty 2

## 2022-01-22 MED ORDER — SENNOSIDES-DOCUSATE SODIUM 8.6-50 MG PO TABS: 1.0000 | ORAL_TABLET | Freq: Every evening | ORAL | Status: AC | PRN

## 2022-01-22 MED ORDER — DOBUTAMINE IN D5W 4-5 MG/ML-% IV SOLN: 2.5000 ug/kg/min | INTRAVENOUS | Status: AC

## 2022-01-22 MED ORDER — HEPARIN (PORCINE) 25000 UT/250ML-% IV SOLN: 1400.0000 [IU]/h | INTRAVENOUS | Status: AC

## 2022-01-22 MED ORDER — SODIUM CHLORIDE 0.9 % IV SOLN
0.5000 mg/h | INTRAVENOUS | Status: DC
  Administered 2022-01-22: 0.5 mg/h via INTRAVENOUS
  Filled 2022-01-22: qty 5

## 2022-01-22 MED ORDER — FREE WATER: 30.0000 mL | Status: AC

## 2022-01-22 MED ORDER — AMIODARONE HCL IN DEXTROSE 360-4.14 MG/200ML-% IV SOLN
30.0000 mg/h | INTRAVENOUS | Status: DC
  Administered 2022-01-22 – 2022-01-23 (×3): 30 mg/h via INTRAVENOUS
  Filled 2022-01-22 (×2): qty 200

## 2022-01-22 MED ORDER — AMIODARONE HCL IN DEXTROSE 360-4.14 MG/200ML-% IV SOLN: 30.0000 mg/h | INTRAVENOUS | Status: AC

## 2022-01-22 MED ORDER — SODIUM CHLORIDE 0.9% FLUSH: 3.0000 mL | Freq: Two times a day (BID) | INTRAVENOUS | Status: AC

## 2022-01-22 MED ORDER — METOPROLOL TARTRATE 5 MG/5ML IV SOLN
2.5000 mg | INTRAVENOUS | Status: DC | PRN
  Administered 2022-01-22: 2.5 mg via INTRAVENOUS
  Filled 2022-01-22: qty 5

## 2022-01-22 MED ORDER — VITAL AF 1.2 CAL PO LIQD
1000.0000 mL | ORAL | Status: DC
  Administered 2022-01-22: 1000 mL

## 2022-01-22 MED ORDER — ONDANSETRON HCL 4 MG PO TABS: 4.0000 mg | ORAL_TABLET | Freq: Four times a day (QID) | ORAL | 0 refills | Status: AC | PRN

## 2022-01-22 MED ORDER — HYDROMORPHONE BOLUS VIA INFUSION: 0.2500 mg | INTRAVENOUS | 0 refills | Status: AC | PRN

## 2022-01-22 MED ORDER — SODIUM CHLORIDE 0.9 % IV SOLN
0.0000 mg/h | INTRAVENOUS | Status: DC
  Filled 2022-01-22: qty 5

## 2022-01-22 MED ORDER — ACETAMINOPHEN 325 MG PO TABS: 650.0000 mg | ORAL_TABLET | Freq: Four times a day (QID) | ORAL | Status: AC | PRN

## 2022-01-22 MED ORDER — SODIUM CHLORIDE 0.9 % IV SOLN: 250.0000 mL | INTRAVENOUS | 0 refills | Status: AC

## 2022-01-22 MED ORDER — VITAL AF 1.2 CAL PO LIQD: 1000.0000 mL | ORAL | Status: AC

## 2022-01-22 MED ORDER — POLYETHYLENE GLYCOL 3350 17 G PO PACK: 17.0000 g | PACK | Freq: Every day | ORAL | 0 refills | Status: AC

## 2022-01-22 MED ORDER — OXYCODONE HCL 5 MG PO TABS: 5.0000 mg | ORAL_TABLET | ORAL | 0 refills | Status: AC | PRN

## 2022-01-22 MED ORDER — AMIODARONE HCL IN DEXTROSE 360-4.14 MG/200ML-% IV SOLN
INTRAVENOUS | Status: AC
  Filled 2022-01-22: qty 200

## 2022-01-22 MED ORDER — PANTOPRAZOLE SODIUM 40 MG PO PACK: 40.0000 mg | PACK | Freq: Every day | ORAL | Status: AC

## 2022-01-22 MED ORDER — HEPARIN BOLUS VIA INFUSION
2400.0000 [IU] | Freq: Once | INTRAVENOUS | Status: AC
  Administered 2022-01-22: 2400 [IU] via INTRAVENOUS
  Filled 2022-01-22: qty 2400

## 2022-01-22 MED ORDER — DOCUSATE SODIUM 50 MG/5ML PO LIQD: 100.0000 mg | Freq: Two times a day (BID) | ORAL | 0 refills | Status: AC

## 2022-01-22 MED ORDER — AMIODARONE IV BOLUS ONLY 150 MG/100ML
150.0000 mg | Freq: Once | INTRAVENOUS | Status: AC
  Administered 2022-01-22: 150 mg via INTRAVENOUS
  Filled 2022-01-22: qty 100

## 2022-01-22 NOTE — Consult Note (Signed)
ANTICOAGULATION CONSULT NOTE  Pharmacy Consult for Heparin Infusion Indication: chest pain/ACS and atrial fibrillation  Patient Measurements: Height: '5\' 8"'$  (172.7 cm) Weight: 80.4 kg (177 lb 4 oz) IBW/kg (Calculated) : 68.4 Heparin Dosing Weight: 82.6 kg   Labs: Recent Labs    01/20/22 0642 01/20/22 1841 12/26/2021 0519 01/03/2022 0644 12/30/2021 0843 01/14/2022 1047 01/22/22 0128 01/22/22 1025  HGB 13.2  --  12.7*  --   --   --  12.4*  --   HCT 40.2  --  39.2  --   --   --  38.2*  --   PLT 119*  --  145*  --   --   --  151  --   HEPARINUNFRC  --   --   --  0.34  --   --  0.17* 0.48  CREATININE 1.77*  --  1.91*  --   --   --  2.45*  --   TROPONINIHS  --  840*  --   --  1,476* 1,471*  --   --      Estimated Creatinine Clearance: 25.6 mL/min (A) (by C-G formula based on SCr of 2.45 mg/dL (H)).   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-sided sciatica    COPD (chronic obstructive pulmonary disease) (HCC)    DDD (degenerative disc disease), lumbar    Hyperlipidemia    Hypertension    Large B-cell lymphoma (HCC)    Pain syndrome, chronic    Pre-diabetes    PVD (peripheral vascular disease) (HCC)    Spondylosis, lumbar, with myelopathy    Tobacco abuse     Medications:  No anticoagulation prior to admission  Assessment: 76 year old male with history of anxiety, hypertension, hyperlipidemia, nonrheumatic mitral valve regurgitation, status post AVR, CAD status post CABG, atrial fibrillation, presented to ED 01/02/2022 for chief concerns of shortness of breath for approximately 6 weeks with intermittent chest pain  Goal of Therapy:  Heparin level 0.3-0.7 units/ml Monitor platelets by anticoagulation protocol: Yes   Plan:  --Heparin level is therapeutic x 1 --Continue heparin infusion at 1400  units/hr --Re-check confirmatory HL in 8 hours --Daily CBC per protocol while on IV heparin  Benita Gutter 01/22/2022,12:04 PM

## 2022-01-22 NOTE — Consult Note (Signed)
ANTICOAGULATION CONSULT NOTE - Initial Consult  Pharmacy Consult for IV heparin infusion Indication: chest pain/ACS  Allergies  Allergen Reactions   Celebrex [Celecoxib] Other (See Comments) and Shortness Of Breath     CHEST PAIN Chest pain   Carvedilol Other (See Comments)    Extreme fatigue   Penicillins Other (See Comments) and Rash    unknown    Patient Measurements: Height: '5\' 8"'$  (172.7 cm) Weight: 84.6 kg (186 lb 8.2 oz) IBW/kg (Calculated) : 68.4 Heparin Dosing Weight: 82.6 kg   Vital Signs: Temp: 97.7 F (36.5 C) (09/28 2000) Temp Source: Axillary (09/28 2000) BP: 110/38 (09/29 0115) Pulse Rate: 84 (09/29 0115)  Labs: Recent Labs    01/07/2022 1550 12/28/2021 1820 01/20/22 6195 01/20/22 1841 12/25/2021 0519 01/20/2022 0644 01/14/2022 0843 01/18/2022 1047 01/22/22 0128  HGB 13.3  --  13.2  --  12.7*  --   --   --  12.4*  HCT 41.8  --  40.2  --  39.2  --   --   --  38.2*  PLT 149*  --  119*  --  145*  --   --   --  151  HEPARINUNFRC  --   --   --   --   --  0.34  --   --  0.17*  CREATININE 1.98*  --  1.77*  --  1.91*  --   --   --   --   TROPONINIHS 641*   < >  --  840*  --   --  1,476* 1,471*  --    < > = values in this interval not displayed.     Estimated Creatinine Clearance: 35.9 mL/min (A) (by C-G formula based on SCr of 1.91 mg/dL (H)).   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-sided sciatica    COPD (chronic obstructive pulmonary disease) (HCC)    DDD (degenerative disc disease), lumbar    Hyperlipidemia    Hypertension    Large B-cell lymphoma (HCC)    Pain syndrome, chronic    Pre-diabetes    PVD (peripheral vascular disease) (HCC)    Spondylosis, lumbar, with myelopathy    Tobacco abuse     Medications:  Heparin 5000 units SQ - last dose 9/27  1346  Assessment: 76 year old male with history of anxiety, hypertension, hyperlipidemia, nonrheumatic mitral valve regurgitation, status post AVR, CAD status post CABG, atrial fibrillation, presented to ED 12/31/2021 for chief concerns of shortness of breath for approximately 6 weeks with intermittent chest pain  Goal of Therapy:  Heparin level 0.3-0.7 units/ml Monitor platelets by anticoagulation protocol: Yes   Plan:  9/29:  HL @ 0128 = 0.17, SUBtherapeutic  Will order heparin 2400 units IV X 1 bolus and increase drip rate to 1400 units/hr.  Will recheck HL 8 hrs after rate change.   Orene Desanctis, PharmD Clinical Pharmacist   01/22/2022,2:05 AM

## 2022-01-22 NOTE — Progress Notes (Signed)
Attempted to call pt's wife to update her on conversation with [name], however unable to reach her.  Was able to reach the patient's son Corderro Koloski and update him who will relay to his mother.   Darel Hong, AGACNP-BC [facility] Prefer epic messenger for cross cover needs If after hours, please call E-link

## 2022-01-22 NOTE — TOC Initial Note (Signed)
Transition of Care [facility]) - Initial/Assessment Note    Patient Details  Name: [name] MRN: [id number] Date of Birth: 03/09/1946  Transition of Care [facility]) CM/SW Contact:    [name] Hutching, RN Phone Number: 01/22/2022, 2:33 PM  Clinical Narrative:                 Patient admitted to the hospital with acute combined systolic and diastolic congestive heart failure.  Patient went into respiratory distress yesterday and required intubation.  Patient is currently in the ICU intubated and sedated requiring pressors.   Patient is from home with is wife.  Prognosis is looking poor.  TOC will cont to follow.   Expected Discharge Plan:  (TBD) Barriers to Discharge: Continued Medical Work up   Patient Goals and CMS Choice Patient states their goals for this hospitalization and ongoing recovery are:: Intubated and sedated      Expected Discharge Plan and Services Expected Discharge Plan:  (TBD)       Living arrangements for the past 2 months: Single Family Home                 DME Arranged: N/A DME Agency: NA                  Prior Living Arrangements/Services Living arrangements for the past 2 months: Single Family Home Lives with:: Spouse Patient language and need for interpreter reviewed:: No        Need for Family Participation in Patient Care: Yes (Comment) Care giver support system in place?: Yes (comment)   Criminal Activity/Legal Involvement Pertinent to Current Situation/Hospitalization: No - Comment as needed  Activities of Daily Living      Permission Sought/Granted      Share Information with NAME: [name]     Permission granted to share info w Relationship: spouse  Permission granted to share info w Contact Information: [phone]  Emotional Assessment   Attitude/Demeanor/Rapport: Intubated (Following Commands or Not Following Commands) Affect (typically observed): Unable to Assess   Alcohol / Substance Use: Not Applicable Psych  Involvement: No (comment)  Admission diagnosis:  NSTEMI (non-ST elevated myocardial infarction) ([place]) [I21.4] Acute exacerbation of CHF (congestive heart failure) ([place]) [I50.9] Acute congestive heart failure, unspecified heart failure type ([place]) [I50.9] Patient Active Problem List   Diagnosis Date Noted   Malnutrition of moderate degree 01/22/2022   AKI (acute kidney injury) ([place]) 01/13/2022   Acute hypoxemic respiratory failure ([place]) 01/20/2022   Acute combined systolic and diastolic congestive heart failure ([place]) 01/02/2022   Tobacco abuse 12/30/2021   Hypotension 01/05/2022   Elevated troponin 01/10/2022   Chronic back pain 01/20/2022   Anxiety and depression 01/02/2022   Polypharmacy 01/22/2022   Chronic constipation 12/30/2021   BPH (benign prostatic hyperplasia) 01/10/2022   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 ([place]) 07/15/2020   Encounter for monitoring cardiotoxic drug therapy 07/15/2020   Stage I pressure ulcer of back 11/03/2015   Facet arthritis of lumbar region 09/26/2014   DDD (degenerative disc disease), lumbar 09/26/2014   Myofacial muscle pain 09/26/2014   Aortic stenosis 10/19/2011   CAD (coronary artery disease) 10/19/2011   PCP:  [name] Crouch, MD Pharmacy:   [facility] [place], [place] - [place] AT [facility] [address] [place] [address] Phone: [phone] Fax: [phone]     Social Determinants of Health (SDOH) Interventions    Readmission Risk Interventions     No data to display

## 2022-01-22 NOTE — Plan of Care (Signed)
Continuing with plan of care. 

## 2022-01-22 NOTE — Progress Notes (Signed)
Patient has had an eventful day, patients heart rate went into the 150s and NP notified who ordered amiodarone bolus and then begin continuous drip, patient was also changed from fentanyl to dilaudid drip, also changed from milrinone to dobutamine. Towards the end of the shift patients MAPs were sustained in the mid to high 50s despite increases in gtts, NP notified and ok with MAPs due to low diastolic pressure and desires for systolic to stay above 945.Will continue to monitor and endorse to oncoming shift.

## 2022-01-22 NOTE — Progress Notes (Signed)
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   Vitals:   01/22/22 1223 01/22/22 1230 01/22/22 1245 01/22/22 1300  BP:    (!) 115/50  Pulse: (!) 129 (!) 132 (!) 124 (!) 133  Resp: (!) 21 (!) 21 (!) 23 (!) 24  Temp:      TempSrc:      SpO2: 94% 95% 94% 95%  Weight:      Height:        Intake/Output Summary (Last 24 hours) at 01/22/2022 1338 Last data filed at 01/22/2022 1234 Gross per 24 hour  Intake 1564.21 ml  Output 850 ml  Net 714.21 ml    LABS: Basic Metabolic Panel: Recent Labs    12/26/2021 0519 01/22/22 0128  NA 142 140  K 4.9 4.6  CL 109 108  CO2 21* 22  GLUCOSE 138* 124*  BUN 93* 94*  CREATININE 1.91* 2.45*  CALCIUM 7.6* 7.5*  MG 2.7* 2.9*   Liver Function Tests: Recent Labs    01/14/2022 1820 01/14/2022 0519  AST 32 45*  ALT 20 30  ALKPHOS 105 97  BILITOT 0.8 0.8  PROT 6.9 6.4*  ALBUMIN 3.1* 2.8*   No results for input(s): "LIPASE", "AMYLASE" in the last 72 hours. CBC: Recent Labs    01/06/2022 0519 01/22/22 0128  WBC 10.3 12.1*  HGB 12.7* 12.4*  HCT 39.2 38.2*  MCV 95.4 95.0  PLT 145* 151   Cardiac Enzymes: No results for input(s): "CKTOTAL", "CKMB", "CKMBINDEX", "TROPONINI" in the last 72 hours. BNP: Invalid input(s): "POCBNP" D-Dimer: No results for input(s): "DDIMER" in the last 72 hours. Hemoglobin A1C: No results for input(s): "HGBA1C" in the last 72 hours. Fasting Lipid Panel: Recent Labs    01/22/22 0501  TRIG 76   Thyroid Function Tests: Recent Labs    12/31/2021 1820  TSH 1.389   Anemia Panel: No results for input(s): "VITAMINB12", "FOLATE",  "FERRITIN", "TIBC", "IRON", "RETICCTPCT" in the last 72 hours.   PHYSICAL EXAM General: Well developed, well nourished, in no acute distress HEENT:  Normocephalic and atramatic Neck:  No JVD.  Lungs: Clear bilaterally to auscultation and percussion. Heart: HRRR . Normal S1 and S2 without gallops or murmurs.  Abdomen: Bowel sounds are positive, abdomen soft and non-tender  Msk:  Back normal, normal gait. Normal strength and tone for age. Extremities: No clubbing, cyanosis or edema.   Neuro: Alert and oriented X 3. Psych:  Good affect, responds appropriately  TELEMETRY: atrial fibrillation  ASSESSMENT AND PLAN: Patient currently intubated for respiratory failure. On amiodarone, Lasix drip for diuresis. Agree with plan. Will follow closely.   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

## 2022-01-22 NOTE — Progress Notes (Signed)
Initial Nutrition Assessment  DOCUMENTATION CODES:   Non-severe (moderate) malnutrition in context of chronic illness  INTERVENTION:   Vital 1.2'@60ml'$ /hr- Initiate at 23m/hr and increase by 181mhr q 8 hours until goal rate is reached.   ProSource TF 20- Give 6029maily via tube, each supplement provides 80kcal and 20g of protein.   Free water flushes 42m60m hours to maintain tube patency   Regimen provides 1808kcal/day, 128g/day protein and 1204ml13m of free water.   Pt at high refeed risk; recommend monitor potassium, magnesium and phosphorus labs daily until stable  NUTRITION DIAGNOSIS:   Moderate Malnutrition related to chronic illness (COPD, lymphoma) as evidenced by moderate fat depletion, moderate muscle depletion.  GOAL:   Provide needs based on ASPEN/SCCM guidelines  MONITOR:   Vent status, Labs, Weight trends, TF tolerance, Skin, I & O's  REASON FOR ASSESSMENT:   Rounds    ASSESSMENT:   75 y/68male with h/o diffuse large B-cell lymphoma right neck (diagnosed 06/2020 and not currently on treatment as pt keeps deferring), CHF, CAD s/p CABG x2, aortic stenosis s/p bioprosthetic AVR, post-op atrial fibrillation, nonrheumatic mitral valve regurgitation, hypertension, hyperlipidemia, anxiety, depression, BPH, COPD, PVD and CKD III who is admitted with CHF and AKI.  Pt sedated and ventilated. OGT in place to LIS with 300ml 12mut. Plan is for possible transfer to tertiary care facility. Will plan to initiate tube feeds if pt does not transfer. Pt is likely at refeed risk. Per chart, pt appears weight stable at baseline. Pt is down ~5lbs since admission but remains up ~2-4lbs from his UBW. Pt -1.1L on his I & Os. Palliative care consult is pending.   Medications reviewed and include: colace, nicotine, protonix, miralax, dobutamine, heparin, levophed, propofol   Labs reviewed: K 4.6 wnl, BUN 94(H), creat 2.45(H), Mg 2.9(H) BNP 4147(H)- 9/26 Wbc- 12.1(H) Cbgs- 105, 121  x 24 hrs  Patient is currently intubated on ventilator support MV: 11.4 L/min Temp (24hrs), Avg:98.2 F (36.8 C), Min:97.7 F (36.5 C), Max:98.6 F (37 C)  Propofol: 12.69 ml/hr- provides 335kcal/day   MAP >65mmHg22mOP- 900ml   72mITION - FOCUSED PHYSICAL EXAM:  Flowsheet Row Most Recent Value  Orbital Region Mild depletion  Upper Arm Region Severe depletion  Thoracic and Lumbar Region Moderate depletion  Buccal Region Mild depletion  Temple Region Severe depletion  Clavicle Bone Region Severe depletion  Clavicle and Acromion Bone Region Severe depletion  Scapular Bone Region Moderate depletion  Dorsal Hand Unable to assess  Patellar Region No depletion  Anterior Thigh Region No depletion  Posterior Calf Region No depletion  Edema (RD Assessment) Mild  Hair Reviewed  Eyes Reviewed  Mouth Reviewed  Skin Reviewed  Nails Reviewed   Diet Order:   Diet Order             Diet NPO time specified  Diet effective now                  EDUCATION NEEDS:   No education needs have been identified at this time  Skin:  Skin Assessment: Reviewed RN Assessment (Skin tear R buttocks)  Last BM:  9/24  Height:   Ht Readings from Last 1 Encounters:  01/20/22 '5\' 8"'$  (1.727 m)    Weight:   Wt Readings from Last 1 Encounters:  01/22/22 80.4 kg    Ideal Body Weight:  70 kg  BMI:  Body mass index is 26.95 kg/m.  Estimated Nutritional Needs:   Kcal:  1783kcal/day  Protein:  120-135g/day  Fluid:  1.8-2.1L/day  Koleen Distance MS, RD, LDN Please refer to [facility] for RD and/or RD on-call/weekend/after hours pager

## 2022-01-22 NOTE — Progress Notes (Signed)
[facility] Cardiology  Patient Description: 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   SUBJECTIVE: UTA; patient intubated   01/22/22: The patient is feeling fair on today. Denies having any chest pain at this time. His dyspnea has improved with the HF oxygen therapy and he is resting comfortably in bed on the cardiac monitor with his daughter at the bedside.   OBJECTIVE:   On 12/27/2021 the patient was found to have ischemic cardiomyopathy with acute systolic CHF. NSTEMI suspected and heparin was started.   On 01/20/22 the patient developed atrial fibrillation but converted back to NSR with amiodarone bolus and gtt; he experienced chest pain overnight that was treated with SL nitroglycerin.    On 01/14/2022 Nephrology consulted and lasix gtt and milrinone gtt started.   Yesterday, 01/05/2022, the patient was intubated at approximately 1239 due to respiratory failure. Norepinephrine started due to hypotension.    Vitals:   01/22/22 0700 01/22/22 0744 01/22/22 0800 01/22/22 0828  BP: (!) 111/41  (!) 114/39   Pulse: 92  94 (!) 130  Resp: '14  14 15  '$ Temp:   98.4 F (36.9 C)   TempSrc:   Axillary   SpO2: 100% 100% 100% 98%  Weight:      Height:         Intake/Output Summary (Last 24 hours) at 01/22/2022 1201 Last data filed at 01/22/2022 0700 Gross per 24 hour  Intake 1073.26 ml  Output 850 ml  Net 223.26 ml      PHYSICAL EXAM  General: Well developed, well nourished HEENT:  Normocephalic and atraumatic.  Neck:   No obviouse JVD. +2 pulses, negative for bruit Lungs: clear bilaterally to auscultation. Chest  expansion symmetrical. No wheezes, rales or rhonchi. intubated.  Heart: HRRR . Normal S1 and S2 without gallops. Grade II/VI systolic murmur.  Abdomen: Bowel sounds are positive, abdomen soft and non-tender  Msk:  Normal tone for age. Decreased strength Extremities: +1 pitting BLE edema. No clubbing or cyanosis.  Neuro: sedated.  Psych: sedated    LABS: Basic Metabolic Panel: Recent Labs    01/10/2022 0519 01/22/22 0128  NA 142 140  K 4.9 4.6  CL 109 108  CO2 21* 22  GLUCOSE 138* 124*  BUN 93* 94*  CREATININE 1.91* 2.45*  CALCIUM 7.6* 7.5*  MG 2.7* 2.9*   Liver Function Tests: Recent Labs    12/28/2021 1820 01/04/2022 0519  AST 32 45*  ALT 20 30  ALKPHOS 105 97  BILITOT 0.8 0.8  PROT 6.9 6.4*  ALBUMIN 3.1* 2.8*   No results for input(s): "LIPASE", "AMYLASE" in the last 72 hours. CBC: Recent Labs    12/31/2021 0519 01/22/22 0128  WBC 10.3 12.1*  HGB 12.7* 12.4*  HCT 39.2 38.2*  MCV 95.4 95.0  PLT 145* 151   Cardiac Enzymes: No results for input(s): "CKTOTAL", "CKMB", "CKMBINDEX", "TROPONINI" in the last 72 hours. BNP: Invalid input(s): "POCBNP" D-Dimer: No results for input(s): "DDIMER" in the last 72 hours. Hemoglobin A1C: No results for input(s): "HGBA1C" in the last 72 hours. Fasting Lipid Panel: Recent Labs    01/22/22 0501  TRIG 76   Thyroid Function Tests: Recent Labs  01/14/2022 1820  TSH 1.389   Anemia Panel: No results for input(s): "VITAMINB12", "FOLATE", "FERRITIN", "TIBC", "IRON", "RETICCTPCT" in the last 72 hours.  DG Chest Port 1 View  Result Date: 01/22/2022 CLINICAL DATA:  Acute respiratory failure with hypoxia EXAM: PORTABLE CHEST 1 VIEW COMPARISON:  Prior chest x-ray 01/16/2022 FINDINGS: The patient remains intubated. The tip of the endotracheal tube is 4.5 cm above the carina. Left IJ central venous catheter remains in unchanged position. The tip overlies the upper SVC. Gastric tube is well position. The proximal side-hole of the  tube overlies the gastric body. Persistent cardiomegaly and pulmonary vascular congestion with mild interstitial edema. Bibasilar airspace opacities likely reflect a combination of pleural fluid and atelectasis. No significant interval change in the appearance of the chest. Atherosclerotic calcifications again noted in the transverse aorta. Patient is status post median sternotomy with evidence of multivessel CABG. IMPRESSION: 1. No interval change in the appearance of the chest. 2. Stable and satisfactory support apparatus. 3. Persistent CHF with probable bilateral pleural effusions and bibasilar atelectasis. Electronically Signed   By: Jacqulynn Cadet M.D.   On: 01/22/2022 07:59   DG Chest Port 1 View  Result Date: 01/22/2022 CLINICAL DATA:  ETT and OG tube placement EXAM: PORTABLE CHEST 1 VIEW COMPARISON:  Chest x-ray dated January 21, 2022 FINDINGS: Interval intubation, ETT tip is proximally 5 cm from the carina. Left IJ line with tip upper SVC. OG tube tip and side port project over the expected area of the stomach. Cardiac and mediastinal contours are unchanged. Status post median sternotomy and CABG. Diffuse bilateral interstitial opacities, likely due to pulmonary edema. Small right pleural effusion. Evidence of pneumothorax. IMPRESSION: 1. Interval intubation, ETT tip is proximally 5 cm from the carina. 2. OG tube tip and side port project over the stomach. 3. Left IJ line with tip in the upper SVC. 4. Unchanged pulmonary edema. Electronically Signed   By: Yetta Glassman M.D.   On: 12/29/2021 14:25   DG Abd 1 View  Result Date: 01/17/2022 CLINICAL DATA:  NG tube placement. EXAM: ABDOMEN - 1 VIEW COMPARISON:  None Available. FINDINGS: The NG tube tip is in the body region of the stomach. IMPRESSION: NG tube tip is in the body region of the stomach. Electronically Signed   By: Marijo Sanes M.D.   On: 12/27/2021 14:23   [place] EKG SITE RITE  Result Date: 12/29/2021 If Site Rite image not  attached, placement could not be confirmed due to current cardiac rhythm.  [place] EKG SITE RITE  Result Date: 01/20/2022 If Site Rite image not attached, placement could not be confirmed due to current cardiac rhythm.  DG Chest Port 1 View  Result Date: 01/16/2022 CLINICAL DATA:  Chest pain and shortness of breath EXAM: PORTABLE CHEST 1 VIEW COMPARISON:  Radiographs 01/16/2022 FINDINGS: Stable cardiomediastinal silhouette. Aortic atherosclerotic calcification. Sternotomy. Increased bilateral diffuse hazy interstitial and airspace opacities greatest in the lower lungs. Increased bilateral layering pleural effusions. No acute osseous abnormality. IMPRESSION: Findings compatible with worsening congestive heart failure. Electronically Signed   By: Placido Sou M.D.   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)  Tobacco abuse   Hypotension   Elevated troponin   Chronic back pain   Anxiety and depression   Polypharmacy   Chronic constipation   BPH (benign prostatic hyperplasia)   Acute hypoxemic respiratory failure (HCC)   AKI (acute kidney injury) ([place])    # fluid volume overload likely secondary to acute, decompensated systolic CHF and ischemic cardiomyopathy, unstable  # acute cardiorenal syndrome  # hypotension # acute respiratory failure with hypoxia requiring intubation  The patient's is in fluid volume overload with initial labs confirming elevated BNP >4,000 and CXR revealing a small right pleural effusion and diffuse increased interstitial markings compatible with congestive heart failure. This was further confirmed by his inpatient echocardiogram  which  was significant for anterior, apical & lateral hypokineses with an estimated EF of 35-40% suggestive of ischemic cardiomyopathy. This is new for the patient as his outpatient echocardiogram from 11/18/21 revealed normal LV function and moderate LVH, an estimated EF >55%,moderate mitral regurgitation and prosthetic aortic valve with good function. Echo from 2020 notable for grade 2 abnormal relaxation. Diuresis had been limited due to hypotension and worsening AKI. A cardiac catheterization was considered, but deferred per patient request and due to worsening AKI and hemodynamic instability. He was started on a heparin gtt on 01/20/2022; Nephrology was consulted and the patient was started on milrinone gtt and lasix gtt on 01/20/22. Has total output of -1200 over 24 hour. He began to decompensate requiring intubation on 01/04/2022.              - Cath deferred at this time due to hemodynamic instability.              - continue IV lasix gtt for diuresis. - agree with norepinephrine to help improve blood pressure.               - continue to hold lisinopril at this time.              - strict I&O's.              - daily weights.              - currently intubated. Agree with current management. RT evaluation appreciated.   - recommend transfer to tertiary care center (preferably [facility]) for further management. ICU team made aware of recommendations per Dr. Clayborn Bigness. Dr. Humphrey Rolls will be seeing this patient today and following him from this point.   # atrial fibrillation with RVR, unstable  The patient went into a-fib with RVR on 9/27 that was treated with amiodarone load and infusion which successfully converted him back to NSR; however, the patient converted back to paroxsymal atrial fibrillation now with RVR and HR in the 140's. The patient has a CHADVASc of 3.   - we would like the goal HR to be at 120 bpm. We will start metoprolol 2.'5mg'$  IV every 4 hours as needed for HR above 120 bpm.   -  continue heparin gtt as above.   - continue amiodarone gtt.    # s/p AVR, stable  # Moderate mitral regurgitation, chronic, stable Echo as above. These are likely contributing factors, but less likely to be the primary cause of fluid volume overload.              - management as above.    # CAD, multivessel s/p CABG x2 x2 (SVG-PDA, SVG-OM 2) in 2013  # elevated troponin due to NSTEMI # tobacco use  Previous chest pain resolve with 3 doses of SL nitro. Troponin trending flat at 641>>6479>>840>>1476>>1471 likely secondary to NSTEMI. Echo findings as above.              - cardiac cath deferred as above.   - heparin gtt as above.   - heart healthy diet.              - continue aspirin and atorvastatin.             - continue nicotine patch.  # AKI # acute cardio ren Appears new for the patient. His creatinine from 12/10/21 was normal at 1.0 (BUN= 21), then elevated at 2.1 (BUN =92) on 01/18/22 and upon admission 1.98>> 1.77 with a BUN of 95>> 86 and 86/1.77 on 9/28. Creatinine continues to be elevated on today at  2.45.              - Nephrology consulted appreciated. Continues to follow.   - continue milrinone per recommendations.     - Dr. Laurelyn Sickle will being seeing the patient on today for Cardiology and will follow from this point.    Megann Easterwood, ACNPC-AG  01/22/2022 12:01 PM

## 2022-01-22 NOTE — Progress Notes (Signed)
[facility]  ROUNDING NOTE   Subjective:   Patient seen and evaluated at bedside in ICU Patient experienced decline in respiratory status yesterday afternoon and is now intubated on vent with 40% FiO2, 5% PEEP.  Propofol and fentanyl used for sedation. Pressors-levo Amiodarone milrinone Heparin drip in place Foley catheter in place with clear yellow urine  Urine output 900 mL in preceding 24 hours. Creatinine 2.45  Objective:  Vital signs in last 24 hours:  Temp:  [97.7 F (36.5 C)-98.6 F (37 C)] 98.4 F (36.9 C) (09/29 1200) Pulse Rate:  [60-165] 127 (09/29 1200) Resp:  [13-22] 18 (09/29 1200) BP: (85-139)/(29-55) 95/45 (09/29 1200) SpO2:  [91 %-100 %] 96 % (09/29 1200) Arterial Line BP: (15-160)/(9-105) 106/34 (09/29 1200) FiO2 (%):  [40 %-60 %] 40 % (09/29 1200) Weight:  [80.4 kg] 80.4 kg (09/29 0600)  Weight change: -4.2 kg Filed Weights   01/20/22 0217 12/28/2021 0645 01/22/22 0600  Weight: 82.6 kg 84.6 kg 80.4 kg    Intake/Output: I/O last 3 completed shifts: In: 1696.6 [P.O.:120; I.V.:1527.6; IV Piggyback:49] Out: 2000 [Urine:1700; Emesis/NG output:300]   Intake/Output this shift:  No intake/output data recorded.  Physical Exam: General: Critically ill-appearing  Head: Normocephalic, atraumatic.  Eyes: Anicteric  Lungs:  Intubation with vent support  Heart: Regular, tachycardic  Abdomen:  Soft, nontender  Extremities: 2+ peripheral edema.  Neurologic: Nonfocal, moving all four extremities  Skin: No lesions  Access: None    Basic Metabolic Panel: Recent Labs  Lab 01/14/2022 1550 01/20/22 0642 01/12/2022 0519 01/22/22 0128  NA 140 141 142 140  K 4.9 4.6 4.9 4.6  CL 109 110 109 108  CO2 22 23 21* 22  GLUCOSE 173* 101* 138* 124*  BUN 95* 86* 93* 94*  CREATININE 1.98* 1.77* 1.91* 2.45*  CALCIUM 8.3* 8.0* 7.6* 7.5*  MG  --   --  2.7* 2.9*    Liver Function Tests: Recent Labs  Lab 01/01/2022 1820 01/14/2022 0519  AST 32 45*  ALT 20  30  ALKPHOS 105 97  BILITOT 0.8 0.8  PROT 6.9 6.4*  ALBUMIN 3.1* 2.8*   No results for input(s): "LIPASE", "AMYLASE" in the last 168 hours. No results for input(s): "AMMONIA" in the last 168 hours.  CBC: Recent Labs  Lab 12/27/2021 1550 01/20/22 0642 12/25/2021 0519 01/22/22 0128  WBC 8.4 9.3 10.3 12.1*  HGB 13.3 13.2 12.7* 12.4*  HCT 41.8 40.2 39.2 38.2*  MCV 96.5 95.7 95.4 95.0  PLT 149* 119* 145* 151    Cardiac Enzymes: No results for input(s): "CKTOTAL", "CKMB", "CKMBINDEX", "TROPONINI" in the last 168 hours.  BNP: Invalid input(s): "POCBNP"  CBG: Recent Labs  Lab 12/31/2021 1638 01/02/2022 1933 12/25/2021 2340 01/22/22 0332 01/22/22 0751  GLUCAP 122* 117* 110* 121* 105*    Microbiology: Results for orders placed or performed during the hospital encounter of 12/25/2021  SARS Coronavirus 2 by RT PCR (hospital order, performed in [facility] hospital lab) *cepheid single result test* Anterior Nasal Swab     Status: None   Collection Time: 01/20/22  1:05 AM   Specimen: Anterior Nasal Swab  Result Value Ref Range Status   SARS Coronavirus 2 by RT PCR NEGATIVE NEGATIVE Final    Comment: (NOTE) SARS-CoV-2 target nucleic acids are NOT DETECTED.  The SARS-CoV-2 RNA is generally detectable in upper and lower respiratory specimens during the acute phase of infection. The lowest concentration of SARS-CoV-2 viral copies this assay can detect is 250 copies / mL. A  negative result does not preclude SARS-CoV-2 infection and should not be used as the sole basis for treatment or other patient management decisions.  A negative result may occur with improper specimen collection / handling, submission of specimen other than nasopharyngeal swab, presence of viral mutation(s) within the areas targeted by this assay, and inadequate number of viral copies (<250 copies / mL). A negative result must be combined with clinical observations, patient history, and epidemiological  information.  Fact Sheet for Patients:   https://www.patel.info/  Fact Sheet for Healthcare Providers: https://hall.com/  This test is not yet approved or  cleared by the [place] FDA and has been authorized for detection and/or diagnosis of SARS-CoV-2 by FDA under an Emergency Use Authorization (EUA).  This EUA will remain in effect (meaning this test can be used) for the duration of the COVID-19 declaration under Section 564(b)(1) of the Act, 21 U.S.C. section 360bbb-3(b)(1), unless the authorization is terminated or revoked sooner.  Performed at [facility], [address] [place], [place] [address]   MRSA Next Gen by PCR, Nasal     Status: None   Collection Time: 01/20/22  2:20 AM   Specimen: Nasal Mucosa; Nasal Swab  Result Value Ref Range Status   MRSA by PCR Next Gen NOT DETECTED NOT DETECTED Final    Comment: (NOTE) The GeneXpert MRSA Assay (FDA approved for NASAL specimens only), is one component of a comprehensive MRSA colonization surveillance program. It is not intended to diagnose MRSA infection nor to guide or monitor treatment for MRSA infections. Test performance is not FDA approved in patients less than 89 years old. Performed at [facility], [address] [place], [place] [address]     Coagulation Studies: No results for input(s): "LABPROT", "INR" in the last 72 hours.  Urinalysis: No results for input(s): "COLORURINE", "LABSPEC", "PHURINE", "GLUCOSEU", "HGBUR", "BILIRUBINUR", "KETONESUR", "PROTEINUR", "UROBILINOGEN", "NITRITE", "LEUKOCYTESUR" in the last 72 hours.  Invalid input(s): "APPERANCEUR"    Imaging: DG Chest Port 1 View  Result Date: 01/22/2022 CLINICAL DATA:  Acute respiratory failure with hypoxia EXAM: PORTABLE CHEST 1 VIEW COMPARISON:  Prior chest x-ray 01/13/2022 FINDINGS: The patient remains intubated. The tip of the endotracheal tube is 4.5 cm above the carina. Left  IJ central venous catheter remains in unchanged position. The tip overlies the upper SVC. Gastric tube is well position. The proximal side-hole of the tube overlies the gastric body. Persistent cardiomegaly and pulmonary vascular congestion with mild interstitial edema. Bibasilar airspace opacities likely reflect a combination of pleural fluid and atelectasis. No significant interval change in the appearance of the chest. Atherosclerotic calcifications again noted in the transverse aorta. Patient is status post median sternotomy with evidence of multivessel CABG. IMPRESSION: 1. No interval change in the appearance of the chest. 2. Stable and satisfactory support apparatus. 3. Persistent CHF with probable bilateral pleural effusions and bibasilar atelectasis. Electronically Signed   By: Jacqulynn Cadet M.D.   On: 01/22/2022 07:59   DG Chest Port 1 View  Result Date: 01/20/2022 CLINICAL DATA:  ETT and OG tube placement EXAM: PORTABLE CHEST 1 VIEW COMPARISON:  Chest x-ray dated January 21, 2022 FINDINGS: Interval intubation, ETT tip is proximally 5 cm from the carina. Left IJ line with tip upper SVC. OG tube tip and side port project over the expected area of the stomach. Cardiac and mediastinal contours are unchanged. Status post median sternotomy and CABG. Diffuse bilateral interstitial opacities, likely due to pulmonary edema. Small right pleural effusion. Evidence of pneumothorax.  IMPRESSION: 1. Interval intubation, ETT tip is proximally 5 cm from the carina. 2. OG tube tip and side port project over the stomach. 3. Left IJ line with tip in the upper SVC. 4. Unchanged pulmonary edema. Electronically Signed   By: Yetta Glassman M.D.   On: 01/07/2022 14:25   DG Abd 1 View  Result Date: 12/30/2021 CLINICAL DATA:  NG tube placement. EXAM: ABDOMEN - 1 VIEW COMPARISON:  None Available. FINDINGS: The NG tube tip is in the body region of the stomach. IMPRESSION: NG tube tip is in the body region of the  stomach. Electronically Signed   By: Marijo Sanes M.D.   On: 12/27/2021 14:23   [place] EKG SITE RITE  Result Date: 12/31/2021 If Site Rite image not attached, placement could not be confirmed due to current cardiac rhythm.  [place] EKG SITE RITE  Result Date: 01/12/2022 If Site Rite image not attached, placement could not be confirmed due to current cardiac rhythm.  DG Chest Port 1 View  Result Date: 01/20/2022 CLINICAL DATA:  Chest pain and shortness of breath EXAM: PORTABLE CHEST 1 VIEW COMPARISON:  Radiographs 01/11/2022 FINDINGS: Stable cardiomediastinal silhouette. Aortic atherosclerotic calcification. Sternotomy. Increased bilateral diffuse hazy interstitial and airspace opacities greatest in the lower lungs. Increased bilateral layering pleural effusions. No acute osseous abnormality. IMPRESSION: Findings compatible with worsening congestive heart failure. Electronically Signed   By: Placido Sou M.D.   On: 01/20/2022 03:25     Medications:    sodium chloride     amiodarone 30 mg/hr (01/22/22 0827)   amiodarone     DOBUTamine 2.5 mcg/kg/min (01/22/22 1156)   furosemide (LASIX) 200 mg in dextrose 5 % 100 mL (2 mg/mL) infusion Stopped (01/22/22 0422)   heparin 1,400 Units/hr (01/22/22 0700)   HYDROmorphone     norepinephrine (LEVOPHED) Adult infusion 19 mcg/min (01/22/22 0700)   propofol (DIPRIVAN) infusion 20 mcg/kg/min (01/22/22 0738)    Chlorhexidine Gluconate Cloth  6 each Topical Q0600   diazepam  5 mg Per Tube Q6H   docusate  100 mg Per Tube BID   fentaNYL  1 patch Transdermal QODAY    HYDROmorphone (DILAUDID) injection  0.5 mg Intravenous Once   nicotine  21 mg Transdermal Daily   mouth rinse  15 mL Mouth Rinse Q2H   pantoprazole  40 mg Per Tube Daily   polyethylene glycol  17 g Per Tube Daily   sodium chloride flush  3 mL Intravenous Q12H   acetaminophen **OR** acetaminophen, amiodarone, hydrALAZINE, HYDROmorphone, metoprolol tartrate, midazolam, nitroGLYCERIN,  ondansetron **OR** ondansetron (ZOFRAN) IV, mouth rinse, mouth rinse, oxyCODONE, senna-docusate  Assessment/ Plan:  [name] is a 76 y.o.  male with past medical history including BPH, hypertension, mitral valve regurgitation, anxiety and depression, hyperlipidemia, CAD with CABG, atrial fibrillation, non-Hodgkin's lymphoma, and chronic back pain, who was admitted to [facility] on 01/04/2022 for NSTEMI (non-ST elevated myocardial infarction) ([place]) [I21.4] Acute exacerbation of CHF (congestive heart failure) ([place]) [I50.9] Acute congestive heart failure, unspecified heart failure type ([place]) [I50.9]   Acute kidney injury on chronic kidney disease stage II.  Baseline creatinine 1.0 with GFR 73 on 12/10/2021.  Acute kidney injury secondary to cardiorenal syndrome.  No IV contrast exposure.  Home diuresis held. Furosemide drip stopped overnight. Creatinine elevated today, but urine output is increased. No acute need for dialysis. Continue current treatments.   Lab Results  Component Value Date   CREATININE 2.45 (H) 01/22/2022   CREATININE 1.91 (H) 01/20/2022   CREATININE  1.77 (H) 01/20/2022    Intake/Output Summary (Last 24 hours) at 01/22/2022 1224 Last data filed at 01/22/2022 0700 Gross per 24 hour  Intake 1073.26 ml  Output 850 ml  Net 223.26 ml   2.  Acute on chronic systolic heart failure.  Elevated BNP and chest x-ray with small pleural effusions.  Echo completed on 01/20/2022 shows EF 35 to 40% with a mild asymmetric LVH and grade 3 diastolic dysfunction.  Also showing a moderate calcification of the aortic valve.  Furosemide stopped and milrinone started yesterday.     3.  Acute respiratory failure likely due to volume overload. Decompensated and intubated yesterday.       LOS: 3 [place] 9/29/202312:24 PM

## 2022-01-22 NOTE — Progress Notes (Signed)
Cardiology had recommended initiating transfer to tertiary care center for advanced heart failure team.  Called and discussed case with PCCM physician Dr. Carlis Abbott at [facility].  Dr. Carlis Abbott then discussed with advanced heart failure team at [facility].    Given his history of diffuse large B cell lymphoma with hypermetabolic adenopathy of the neck, right chest, anterior spleen, and possible osseous involvement of which he was offered chemotherapy, but declined chemotherapy, the Heart Failure team at [facility] does not feel that he is a candidate for mechanical therapy.  They agree with current measures including inotropes and vasopressors, diuresis as blood pressure and renal function permits.  Will not transfer at this time.  Will relay this information to the family.  Will consult palliative care to assist with further goals of care discussions.    Darel Hong, AGACNP-BC [facility] Prefer epic messenger for cross cover needs If after hours, please call E-link

## 2022-01-22 NOTE — Progress Notes (Signed)
NAME:  [name], MRN:  [id number], DOB:  Feb 09, 1946, LOS: 3 ADMISSION DATE:  01/20/2022, CONSULTATION DATE:  01/15/2022 REFERRING MD:  Dr. Billie Ruddy, CHIEF COMPLAINT:  Shortness of Breath, Cardiogenic shock   Brief Pt Description / Synopsis:  76 y.o. 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  Cardiology recommends transfer for Advanced Heart Failure.  History of Present Illness:  [name] is a 76 year old male with a past medical history significant for CAD status post CABG, atrial fibrillation, nonrheumatic mitral valve regurgitation, status post AVR, hypertension, hyperlipidemia, BPH non-Hodgkin's lymphoma, chronic back pain, anxiety, depression who presented to [facility] ED on 01/22/2022 due to complaints of shortness of breath and intermittent chest pain.  He endorsed symptoms have been progressive over the last 6 weeks.  He also endorsed orthopnea, dyspnea on exertion, and approximately 20 pound weight gain with bilateral lower extremity swelling and edema.  He denied fever, chills, abdominal pain, dysuria.  Initial vitals in the emergency department showed temperature 97.7, respiration rate of 20, heart rate of 101, blood pressure 113/40, now 110/41, SPO2 of 91% on 3 L nasal cannula.   Serum sodium is 140, potassium 4.9, chloride of 109, bicarb 22, BUN 95, serum creatinine 1.98, GFR 35, nonfasting blood glucose 173, WBC 8.4, hemoglobin 13.3, platelets of 149.   BNP was elevated at 4147.3.  High sensitive troponin was 641 and increased to 679.   ED treatment: Fentanyl 50 mcg, furosemide 40 mg IV one-time dose, nicotine patch, ondansetron 4 mg IV  He was admitted by the hospitalist for further work-up and treatment of acute decompensated CHF.  Cardiology was consulted.  Please see "significant hospital events" section  below for full detailed hospital course.   Pertinent  Medical History   Past Medical History:  Diagnosis Date   Aortic stenosis    CAD (coronary artery disease)    Cervicalgia    CHF (congestive heart failure) (HCC)    Chronic bilateral low back pain with right-sided sciatica    COPD (chronic obstructive pulmonary disease) (HCC)    DDD (degenerative disc disease), lumbar    Hyperlipidemia    Hypertension    Large B-cell lymphoma (HCC)    Pain syndrome, chronic    Pre-diabetes    PVD (peripheral vascular disease) (HCC)    Spondylosis, lumbar, with myelopathy    Tobacco abuse      Micro Data:  9/27: SARS-CoV-2 PCR>> negative 9/27: MRSA PCR>> negative  Antimicrobials:  N/A  Significant Hospital Events: Including procedures, antibiotic start and stop dates in addition to other pertinent events   9/26: Admitted by hospitalist for CHF exacerbation.  Cardiology consulted. 9/27: Patient with chest pain, concern for possible NSTEMI.  Placed on heparin drip.  Later developed atrial fibrillation with RVR requiring amiodarone drip. 9/28: Developed hypotension, start on Milrinone due to developing cardiorenal syndrome as per nephrology/Cardiology.  Became hypotensive requiring Levophed, PCCM consulted. Code status changed to LTD Code (NO CPR). Worsening respiratory status requiring intubation. 9/29: Worsening renal function.  Developed A. Fib with RVR, Amiodarone bolus and drip initiated.  Milrinone changed to Dobutamine.  Interim History / Subjective:  -No significant events noted overnight, right radial A-line placed -Afebrile, continues to requires Levophed (19 mcg) -This morning again developed A. Fib w/ RVR ~ give Amiodarone bolus and place back on Amiodarone gtt (d/c PO amio)  -Given arrhythmia, will change Milrinone to  Dobutamine -Creatinine worsened to 2.45 from 1.9, UOP 900 cc last 24 hrs (net - 1.7 L) -Cardiology recommends transfer to Higher level of care for Advanced Heart  Failure team -Discussed cardiology's recommendations with family, they would like to consider transfer to [facility]  Objective   Blood pressure (!) 111/41, pulse 92, temperature 97.9 F (36.6 C), temperature source Axillary, resp. rate 14, height 5' 8"  (1.727 m), weight 80.4 kg, SpO2 100 %.    Vent Mode: PRVC FiO2 (%):  [40 %-60 %] 40 % Set Rate:  [16 bmp] 16 bmp Vt Set:  [550 mL] 550 mL PEEP:  [5 cmH20] 5 cmH20 Plateau Pressure:  [13 cmH20-18 cmH20] 14 cmH20   Intake/Output Summary (Last 24 hours) at 01/22/2022 4818 Last data filed at 01/22/2022 0700 Gross per 24 hour  Intake 1153.22 ml  Output 1200 ml  Net -46.78 ml    Filed Weights   01/20/22 0217 01/17/2022 0645 01/22/22 0600  Weight: 82.6 kg 84.6 kg 80.4 kg    Examination: General: Acute on chronically ill-appearing male, laying in bed, intubated and lightly sedated, no acute distress HENT: Atraumatic, normocephalic, neck supple, positive JVD Lungs: Coarse breath sounds bilaterally, even, synchronous with the vent Cardiovascular: Tachycardia, irregular irregular rhythm, no murmurs, rubs or gallops Abdomen: Soft, nontender, nondistended, no guarding rebound tenderness, bowel sounds positive x4 Extremities: Cool extremities, 3+ edema bilateral lower extremities Neuro: Lightly sedated to a RASS of -1, arouses to voice and moves all extremities to command, no focal deficits noted, pupils PERRLA GU: Foley catheter in place draining yellow urine  Resolved Hospital Problem list     Assessment & Plan:   Cardiogenic Shock Acute Decompensated combined Systolic & Diastolic CHF Atrial Fibrillation w/ RVR ~ converted back to NSR Elevated troponin, demand ischemia vs NSTEMI. PMHx: CAD s/p CABG x2 in 2013, Moderate mitral regurgitation, s/p AVR Echocardiogram 01/20/2022: LVEF 35 to 56%, grade 3 diastolic dysfunction, RV systolic function low normal, mild to moderate tricuspid regurgitation, mild to moderate aortic  regurgitation -Continuous cardiac monitoring -Maintain MAP >65 -Cardiology following, appreciate input ~ RECOMMENDING TRANSFER TO HIGHER LEVEL OF CARE FOR ADVANCED HEART FAILURE -Inotrope's as per Cardiology ~  follow Coox panel -Vasopressors as needed to maintain MAP goal -Heparin gtt and Amiodarone gtt -Trend lactic acid until normalized -HS Troponin peaked at 1476 -Diuresis as renal function and blood pressure permits ~currently holding Lasix drip due to hypotension  Acute Hypoxic Respiratory Failure in the setting of CHF PMHx: Tobacco abuse -Full vent support, implement lung protective strategies -Plateau pressures less than 30 cm H20 -Wean FiO2 & PEEP as tolerated to maintain O2 sats >92% -Follow intermittent Chest X-ray & ABG as needed -Spontaneous Breathing Trials when respiratory parameters met and mental status permits -Implement VAP Bundle -Prn Bronchodilators -Diuresis as blood pressure and renal function permits  AKI in setting of Cardiorenal Syndrome PMHx: BPH -Monitor I&O's / urinary output -Follow BMP -Ensure adequate renal perfusion -Avoid nephrotoxic agents as able -Replace electrolytes as indicated -Nephrology following, appreciate input  Sedation needs in setting of mechanical ventilation -Maintain a RASS goal of 0 to -1 -Fentanyl and Propofol as needed to maintain RASS goal -Avoid sedating medications as able -Daily wake up assessment    Patient is critically ill with end-stage combined CHF.  High risk for further deterioration, cardiac arrest and death.  Overall long term prognosis is poor.  Pt's family request LTD code status (NO CPR).    Best Practice (right click and "Reselect all SmartList Selections"  daily)   Diet/type: NPO DVT prophylaxis: systemic heparin GI prophylaxis: PPI Lines: Central line and yes and it is still needed, right radial A-line and is still needed Foley:  Yes, and it is still needed Code Status:  limited (NO CPR) Last  date of multidisciplinary goals of care discussion [01/22/2022]  9/29: Patient's wife, daughter, and son updated at bedside  Labs   CBC: Recent Labs  Lab 01/08/2022 1550 01/20/22 0642 01/02/2022 0519 01/22/22 0128  WBC 8.4 9.3 10.3 12.1*  HGB 13.3 13.2 12.7* 12.4*  HCT 41.8 40.2 39.2 38.2*  MCV 96.5 95.7 95.4 95.0  PLT 149* 119* 145* 151     Basic Metabolic Panel: Recent Labs  Lab 01/07/2022 1550 01/20/22 0642 01/08/2022 0519 01/22/22 0128  NA 140 141 142 140  K 4.9 4.6 4.9 4.6  CL 109 110 109 108  CO2 22 23 21* 22  GLUCOSE 173* 101* 138* 124*  BUN 95* 86* 93* 94*  CREATININE 1.98* 1.77* 1.91* 2.45*  CALCIUM 8.3* 8.0* 7.6* 7.5*  MG  --   --  2.7* 2.9*    GFR: Estimated Creatinine Clearance: 25.6 mL/min (A) (by C-G formula based on SCr of 2.45 mg/dL (H)). Recent Labs  Lab 01/06/2022 1550 01/20/22 0642 12/28/2021 0519 01/18/2022 1826 01/14/2022 2301 01/22/22 0128  WBC 8.4 9.3 10.3  --   --  12.1*  LATICACIDVEN  --   --   --  1.7 1.1  --      Liver Function Tests: Recent Labs  Lab 12/27/2021 1820 01/20/2022 0519  AST 32 45*  ALT 20 30  ALKPHOS 105 97  BILITOT 0.8 0.8  PROT 6.9 6.4*  ALBUMIN 3.1* 2.8*    No results for input(s): "LIPASE", "AMYLASE" in the last 168 hours. No results for input(s): "AMMONIA" in the last 168 hours.  ABG    Component Value Date/Time   PHART 7.35 01/01/2022 0259   PCO2ART 29 (L) 12/29/2021 0259   PO2ART 67 (L) 12/27/2021 0259   HCO3 20.2 01/18/2022 1405   ACIDBASEDEF 6.8 (H) 01/04/2022 1405   O2SAT 72.8 12/27/2021 1405     Coagulation Profile: No results for input(s): "INR", "PROTIME" in the last 168 hours.  Cardiac Enzymes: No results for input(s): "CKTOTAL", "CKMB", "CKMBINDEX", "TROPONINI" in the last 168 hours.  HbA1C: No results found for: "HGBA1C"  CBG: Recent Labs  Lab 01/01/2022 1638 01/18/2022 1933 01/16/2022 2340 01/22/22 0332 01/22/22 0751  GLUCAP 122* 117* 110* 121* 105*     Review of Systems:   Unable to  assess due to intubation/sedation/critical illness   Past Medical History:  He,  has a past medical history of Aortic stenosis, CAD (coronary artery disease), Cervicalgia, CHF (congestive heart failure) (HCC), Chronic bilateral low back pain with right-sided sciatica, COPD (chronic obstructive pulmonary disease) ([place]), DDD (degenerative disc disease), lumbar, Hyperlipidemia, Hypertension, Large B-cell lymphoma ([place]), Pain syndrome, chronic, Pre-diabetes, PVD (peripheral vascular disease) ([place]), Spondylosis, lumbar, with myelopathy, and Tobacco abuse.   Surgical History:   Past Surgical History:  Procedure Laterality Date   cardiac stents     CORONARY ANGIOPLASTY     CORONARY ARTERY BYPASS GRAFT     VSD REPAIR       Social History:   reports that he has been smoking cigarettes. He has a 40.00 pack-year smoking history. He has never used smokeless tobacco. He reports that he does not drink alcohol and does not use drugs.   Family History:  His family history includes Arthritis in his  mother; Asthma in his son; Early death in his father; Heart disease in his father and mother; Thyroid disease in his sister.   Allergies Allergies  Allergen Reactions   Celebrex [Celecoxib] Other (See Comments) and Shortness Of Breath     CHEST PAIN Chest pain   Carvedilol Other (See Comments)    Extreme fatigue   Penicillins Other (See Comments) and Rash    unknown     Home Medications  Prior to Admission medications   Medication Sig Start Date End Date Taking? Authorizing Provider  atorvastatin (LIPITOR) 20 MG tablet Take 20 mg by mouth daily.  05/30/15  Yes [provider]  busPIRone (BUSPAR) 15 MG tablet Take 15 mg by mouth 3 (three) times daily.  05/30/15  Yes [provider]  citalopram (CELEXA) 20 MG tablet Take 20 mg by mouth daily.   Yes [provider]  diazepam (VALIUM) 5 MG tablet One pill 45-60 mins prior to scan; 1 pill 15 mins prior if needed. Patient taking  differently: Take 5 mg by mouth every 6 (six) hours as needed. One pill 45-60 mins prior to scan; 1 pill 15 mins prior if needed. 07/17/20  Yes Cammie Sickle, MD  fentaNYL (DURAGESIC) 100 MCG/HR 1 patch every other day. 06/18/20  Yes [provider]  furosemide (LASIX) 40 MG tablet Take 40 mg by mouth daily. 01/18/22  Yes [provider]  gemfibrozil (LOPID) 600 MG tablet Take 600 mg by mouth 2 (two) times daily before a meal.   Yes [provider]  lisinopril (PRINIVIL,ZESTRIL) 10 MG tablet Take 30 mg by mouth daily. (take 10 mg in the am and 20 mg in the evening to equal 30 mg daily)   Yes [provider]  metoprolol tartrate (LOPRESSOR) 25 MG tablet Take 25 mg by mouth 2 (two) times daily.   Yes [provider]  tamsulosin (FLOMAX) 0.4 MG CAPS capsule Take 0.4 mg by mouth daily.   Yes [provider]  butalbital-aspirin-caffeine Acquanetta Chain) 50-325-40 MG capsule Take 1 capsule by mouth every 6 (six) hours as needed for headache.     [provider]  docusate sodium (COLACE) 100 MG capsule Take 100 mg by mouth 3 (three) times daily.    [provider]  levofloxacin (LEVAQUIN) 500 MG tablet Take 500 mg by mouth daily. Patient not taking: Reported on 01/10/2022 01/08/22   [provider]  Oxycodone HCl 10 MG TABS Take 10 mg by mouth every 4 (four) hours as needed. 01/08/22   [provider]  trolamine salicylate (ASPERCREME) 10 % cream Apply 1 Application topically as needed.    [provider]     Critical care time: 40 minutes     Darel Hong, AGACNP-BC [facility] epic messenger for cross cover needs If after hours, please call E-link

## 2022-01-22 NOTE — Consult Note (Signed)
ANTICOAGULATION CONSULT NOTE  Pharmacy Consult for Heparin Infusion Indication: chest pain/ACS and atrial fibrillation  Patient Measurements: Height: '5\' 8"'$  (172.7 cm) Weight: 80.4 kg (177 lb 4 oz) IBW/kg (Calculated) : 68.4 Heparin Dosing Weight: 82.6 kg   Labs: Recent Labs    01/20/22 0642 01/20/22 1841 12/27/2021 0519 01/07/2022 0644 01/02/2022 0843 01/04/2022 1047 01/22/22 0128 01/22/22 1025 01/22/22 1806  HGB 13.2  --  12.7*  --   --   --  12.4*  --   --   HCT 40.2  --  39.2  --   --   --  38.2*  --   --   PLT 119*  --  145*  --   --   --  151  --   --   HEPARINUNFRC  --   --   --    < >  --   --  0.17* 0.48 0.48  CREATININE 1.77*  --  1.91*  --   --   --  2.45*  --   --   TROPONINIHS  --  840*  --   --  1,476* 1,471*  --   --   --    < > = values in this interval not displayed.    Estimated Creatinine Clearance: 25.6 mL/min (A) (by C-G formula based on SCr of 2.45 mg/dL (H)).   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-sided sciatica    COPD (chronic obstructive pulmonary disease) (HCC)    DDD (degenerative disc disease), lumbar    Hyperlipidemia    Hypertension    Large B-cell lymphoma (HCC)    Pain syndrome, chronic    Pre-diabetes    PVD (peripheral vascular disease) (HCC)    Spondylosis, lumbar, with myelopathy    Tobacco abuse     Medications:  No anticoagulation prior to admission  Assessment: 76 year old male with history of anxiety, hypertension, hyperlipidemia, nonrheumatic mitral valve regurgitation, status post AVR, CAD status post CABG, atrial fibrillation, presented to ED 01/22/2022 for chief concerns of shortness of breath for approximately 6 weeks with intermittent chest pain  Goal of Therapy:  Heparin level 0.3-0.7 units/ml Monitor platelets by anticoagulation protocol:  Yes  Date Time HL Rate/Comment 9/29 1025 0.48 Therapeutic x1 9/29 1904 0.48 Therapeutic x2  Plan:  Continue heparin infusion at 1400 units/hr Check anti-Xa level with AM labs Continue to monitor H&H and platelets daily while on heparin gtt.  Darrick Penna 01/22/2022,7:13 PM

## 2022-01-23 ENCOUNTER — Inpatient Hospital Stay: Payer: Medicare Other

## 2022-01-23 DIAGNOSIS — I509 Heart failure, unspecified: Secondary | ICD-10-CM

## 2022-01-23 LAB — BASIC METABOLIC PANEL
Anion gap: 15 (ref 5–15)
BUN: 95 mg/dL — ABNORMAL HIGH (ref 8–23)
CO2: 17 mmol/L — ABNORMAL LOW (ref 22–32)
Calcium: 7.1 mg/dL — ABNORMAL LOW (ref 8.9–10.3)
Chloride: 109 mmol/L (ref 98–111)
Creatinine, Ser: 2.6 mg/dL — ABNORMAL HIGH (ref 0.61–1.24)
GFR, Estimated: 25 mL/min — ABNORMAL LOW (ref >60)
Glucose, Bld: 173 mg/dL — ABNORMAL HIGH (ref 70–99)
Potassium: 4.3 mmol/L (ref 3.5–5.1)
Sodium: 141 mmol/L (ref 135–145)

## 2022-01-23 LAB — BLOOD GAS, ARTERIAL
Acid-base deficit: 6.6 mmol/L — ABNORMAL HIGH (ref 0.0–2.0)
Bicarbonate: 18.3 mmol/L — ABNORMAL LOW (ref 20.0–28.0)
FIO2: 40 %
MECHVT: 550 mL
Mechanical Rate: 16
O2 Saturation: 90.7 %
PEEP: 5 cmH2O
Patient temperature: 37
pCO2 arterial: 34 mmHg (ref 32–48)
pH, Arterial: 7.34 — ABNORMAL LOW (ref 7.35–7.45)
pO2, Arterial: 63 mmHg — ABNORMAL LOW (ref 83–108)

## 2022-01-23 LAB — MAGNESIUM: Magnesium: 2.7 mg/dL — ABNORMAL HIGH (ref 1.7–2.4)

## 2022-01-23 LAB — CBC
HCT: 38.8 % — ABNORMAL LOW (ref 39.0–52.0)
Hemoglobin: 12.4 g/dL — ABNORMAL LOW (ref 13.0–17.0)
MCH: 30.6 pg (ref 26.0–34.0)
MCHC: 32 g/dL (ref 30.0–36.0)
MCV: 95.8 fL (ref 80.0–100.0)
Platelets: 166 10*3/uL (ref 150–400)
RBC: 4.05 MIL/uL — ABNORMAL LOW (ref 4.22–5.81)
RDW: 17 % — ABNORMAL HIGH (ref 11.5–15.5)
WBC: 16.4 10*3/uL — ABNORMAL HIGH (ref 4.0–10.5)
nRBC: 0.2 % (ref 0.0–0.2)

## 2022-01-23 LAB — LACTIC ACID, PLASMA: Lactic Acid, Venous: 2.4 mmol/L (ref 0.5–1.9)

## 2022-01-23 LAB — HEPARIN LEVEL (UNFRACTIONATED): Heparin Unfractionated: 0.47 IU/mL (ref 0.30–0.70)

## 2022-01-23 LAB — PHOSPHORUS: Phosphorus: 8.1 mg/dL — ABNORMAL HIGH (ref 2.5–4.6)

## 2022-01-23 MED ORDER — HYDROCORTISONE SOD SUC (PF) 100 MG IJ SOLR
100.0000 mg | Freq: Two times a day (BID) | INTRAMUSCULAR | Status: DC
  Administered 2022-01-23: 100 mg via INTRAVENOUS
  Filled 2022-01-23 (×2): qty 2

## 2022-01-23 MED ORDER — SODIUM CHLORIDE 0.9 % IV SOLN
2.0000 mg/h | INTRAVENOUS | Status: DC
  Administered 2022-01-23: 2 mg/h via INTRAVENOUS
  Filled 2022-01-23: qty 5

## 2022-01-23 MED ORDER — GLYCOPYRROLATE 0.2 MG/ML IJ SOLN: 0.2000 mg | INTRAMUSCULAR | Status: DC | PRN

## 2022-01-23 MED ORDER — GLYCOPYRROLATE 1 MG PO TABS: 1.0000 mg | ORAL_TABLET | ORAL | Status: DC | PRN

## 2022-01-23 MED ORDER — ACETAMINOPHEN 325 MG PO TABS: 650.0000 mg | ORAL_TABLET | Freq: Four times a day (QID) | ORAL | Status: DC | PRN

## 2022-01-23 MED ORDER — SODIUM CHLORIDE 0.9 % IV SOLN: INTRAVENOUS | Status: DC

## 2022-01-23 MED ORDER — LORAZEPAM BOLUS VIA INFUSION
3.0000 mg | INTRAVENOUS | Status: DC | PRN
  Administered 2022-01-23: 3 mg via INTRAVENOUS

## 2022-01-23 MED ORDER — POLYVINYL ALCOHOL 1.4 % OP SOLN: 1.0000 [drp] | Freq: Four times a day (QID) | OPHTHALMIC | Status: DC | PRN

## 2022-01-23 MED ORDER — HYDROMORPHONE BOLUS VIA INFUSION
1.0000 mg | INTRAVENOUS | Status: DC | PRN
  Administered 2022-01-23: 1 mg via INTRAVENOUS

## 2022-01-23 MED ORDER — HALOPERIDOL LACTATE 5 MG/ML IJ SOLN: 2.5000 mg | INTRAMUSCULAR | Status: DC | PRN

## 2022-01-23 MED ORDER — SODIUM CHLORIDE 0.9 % IV BOLUS
500.0000 mL | Freq: Once | INTRAVENOUS | Status: AC
  Administered 2022-01-23: 500 mL via INTRAVENOUS

## 2022-01-23 MED ORDER — LORAZEPAM 2 MG/ML IJ SOLN
5.0000 mg/h | INTRAVENOUS | Status: DC
  Administered 2022-01-23: 5 mg/h via INTRAVENOUS
  Filled 2022-01-23: qty 25

## 2022-01-23 MED ORDER — ACETAMINOPHEN 650 MG RE SUPP: 650.0000 mg | Freq: Four times a day (QID) | RECTAL | Status: DC | PRN

## 2022-01-23 MED ORDER — LORAZEPAM BOLUS VIA INFUSION: 1.0000 mg | INTRAVENOUS | Status: DC | PRN

## 2022-01-24 NOTE — Consult Note (Signed)
ANTICOAGULATION CONSULT NOTE  Pharmacy Consult for Heparin Infusion Indication: chest pain/ACS and atrial fibrillation  Patient Measurements: Height: '5\' 8"'$  (172.7 cm) Weight: 80.4 kg (177 lb 4 oz) IBW/kg (Calculated) : 68.4 Heparin Dosing Weight: 82.6 kg   Labs: Recent Labs    01/20/22 1841 01/17/2022 0519 01/03/2022 0644 01/11/2022 0843 01/14/2022 1047 01/22/22 0128 01/22/22 1025 01/22/22 1806 27-Jan-2022 0308 01-27-22 0447  HGB  --  12.7*  --   --   --  12.4*  --   --  12.4*  --   HCT  --  39.2  --   --   --  38.2*  --   --  38.8*  --   PLT  --  145*  --   --   --  151  --   --  166  --   HEPARINUNFRC  --   --    < >  --   --  0.17* 0.48 0.48  --  0.47  CREATININE  --  1.91*  --   --   --  2.45*  --   --  2.60*  --   TROPONINIHS 840*  --   --  1,476* 1,471*  --   --   --   --   --    < > = values in this interval not displayed.     Estimated Creatinine Clearance: 24.1 mL/min (A) (by C-G formula based on SCr of 2.6 mg/dL (H)).   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-sided sciatica    COPD (chronic obstructive pulmonary disease) (HCC)    DDD (degenerative disc disease), lumbar    Hyperlipidemia    Hypertension    Large B-cell lymphoma (HCC)    Pain syndrome, chronic    Pre-diabetes    PVD (peripheral vascular disease) (HCC)    Spondylosis, lumbar, with myelopathy    Tobacco abuse     Medications:  No anticoagulation prior to admission  Assessment: 76 year old male with history of anxiety, hypertension, hyperlipidemia, nonrheumatic mitral valve regurgitation, status post AVR, CAD status post CABG, atrial fibrillation, presented to ED 12/31/2021 for chief concerns of shortness of breath for approximately 6 weeks with intermittent chest pain  Goal of Therapy:  Heparin level 0.3-0.7 units/ml Monitor  platelets by anticoagulation protocol: Yes  Date Time HL Rate/Comment 9/29 1025 0.48 Therapeutic x1 9/29 1904 0.48 Therapeutic x2 9/30     0447    0.47    Therapeutic X 3   Plan:  9/30:  HL @ 0447 = 0.47, therapeutic X 3 Will continue pt on current rate and recheck HL on 10/01 with AM labs.  Avalyn Molino D 27-Jan-2022,5:28 AM

## 2022-01-24 NOTE — Progress Notes (Signed)
AT 10:30am patient's family members (spouse, son, and daughter), MD, NP, and this nurse present met in conference room and NP reviewed goals of care.  Family voiced desire for patient to be comfort care and would like to call the patients sister before proceeding with comfort measures.  Family to let this nurse know when ready to initiate comfort care.  Chaplain services offered, support given to patient and patient's family.

## 2022-01-24 NOTE — Progress Notes (Signed)
Chaplain provided family supportive presence before, during and after extubation. Chaplain prepared family for extubation and they desired spiritual presence in the room at time of death. Family was able to visit at time of death. Family is very grateful for nursing care that got him comfort and in less pain over the past 24 hours. Chaplain offered prayer with family at bedside.     17-Feb-2022 1600  Clinical Encounter Type  Visited With Patient and family together  Visit Type Patient actively dying;Critical Care;Death

## 2022-01-24 NOTE — Progress Notes (Signed)
  Patient accepted for transfer to [name] per previous discussion with cardiology. Patient was already assigned a bed to [facility] and they  called to initiate transfer. 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 Per discussion with day team provider, heart failure team agreed with current measures including inotropes and vasopressors, diuresis as blood pressure and renal function permits. I discussed this with patient's wife who opted not to transfer patient to [facility] at this time. They plan to have a meeting on 9/29 at 10 am to discuss goals of care.     Rufina Falco, DNP, CCRN, FNP-C, AGACNP-BC Acute Care & Family Nurse Practitioner  [facility]  See Amion for personal pager PCCM on call pager [phone] until 7 am

## 2022-01-24 NOTE — Death Summary Note (Signed)
DEATH SUMMARY   Patient Details  Name: [name] MRN: [id number] DOB: March 31, 1946  Admission/Discharge Information   Admit Date:  2022/01/29  Date of Death:  02-02-2022  Time of Death:  1543-07-18  Length of Stay: 4  Referring Physician: Idelle Crouch, MD   Reason(s) for Hospitalization  Shortness of breath and chest pain   Diagnoses  Preliminary cause of death: Combined congestive systolic and diastolic heart failure ([place]) Secondary Diagnoses (including complications and co-morbidities):  Principal Problem:   Acute combined systolic and diastolic congestive heart failure ([place]) Active Problems:   Facet arthritis of lumbar region   DDD (degenerative disc disease), lumbar   CAD (coronary artery disease)   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 NSTEMI    Cardiorenal syndrome    Cardiogenic shock     Brief Hospital Course (including significant findings, care, treatment, and services provided and events leading to death)  KAYSHAUN POLANCO is a 76 y.o. year old male with with a past medical history significant for CAD status post CABG, atrial fibrillation, nonrheumatic mitral valve regurgitation, status post AVR, hypertension, hyperlipidemia, BPH non-Hodgkin's lymphoma, chronic back pain, anxiety, depression who presented to [facility] ED on 2022/01/29 due to complaints of shortness of breath, intermittent chest pain, and a 20 pound weight gain with bilateral lower extremity swelling.  Pt admitted to the hospitalist service and Cardiology consulted.  See significant hospital events below.   Significant  Hospital Events:  2023-01-30: Admitted by hospitalist for CHF exacerbation.  Cardiology consulted. 9/27: Patient with chest pain, concern for possible NSTEMI.  Placed on heparin drip.  Later developed atrial fibrillation with RVR requiring amiodarone drip. 9/28: Developed hypotension, start on Milrinone due to developing cardiorenal syndrome as per nephrology/Cardiology.  Became hypotensive requiring Levophed, PCCM consulted. Code status changed to LTD Code (NO CPR). Worsening respiratory status requiring intubation. 9/29: Worsening renal function.  Developed A. Fib with RVR, Amiodarone bolus and drip initiated.  Milrinone changed to Dobutamine. Advanced HF team at [facility] declined 03-Feb-2023: Declined overnight. Increased pressor requirements (40 mcg Levo), increase in FiO2 to 60% and 8 PEEP, worsening creatinine and urine output.  Following goals of care conversation pt transitioned to Comfort Measures Only.  Pt expired at 1545 on 02-Feb-2022  Pertinent Labs and Studies  Significant Diagnostic Studies DG Chest 1 View  Result Date: 2022/02/02 CLINICAL DATA:  Acute respiratory failure EXAM: PORTABLE CHEST 1 VIEW COMPARISON:  01/22/2022 FINDINGS: Cardiac shadow is enlarged but stable. Postsurgical changes are again seen. Endotracheal tube, gastric catheter and left jugular central line are again seen and stable. Persistent changes of CHF for noted with increasing right-sided pleural effusion. There is likely some underlying increased consolidation in the right base as well. No other focal abnormality is noted. IMPRESSION: Stable CHF with increasing right-sided pleural effusion and basilar consolidation. Electronically Signed   By: Inez Catalina M.D.   On: 2022-02-02 03:21   DG Chest Port 1 View  Result Date: 01/22/2022 CLINICAL DATA:  Acute respiratory failure with hypoxia EXAM: PORTABLE CHEST 1 VIEW COMPARISON:  Prior chest x-ray 01/09/2022 FINDINGS:  The patient remains intubated. The tip of the endotracheal  tube is 4.5 cm above the carina. Left IJ central venous catheter remains in unchanged position. The tip overlies the upper SVC. Gastric tube is well position. The proximal side-hole of the tube overlies the gastric body. Persistent cardiomegaly and pulmonary vascular congestion with mild interstitial edema. Bibasilar airspace opacities likely reflect a combination of pleural fluid and atelectasis. No significant interval change in the appearance of the chest. Atherosclerotic calcifications again noted in the transverse aorta. Patient is status post median sternotomy with evidence of multivessel CABG. IMPRESSION: 1. No interval change in the appearance of the chest. 2. Stable and satisfactory support apparatus. 3. Persistent CHF with probable bilateral pleural effusions and bibasilar atelectasis. Electronically Signed   By: Jacqulynn Cadet M.D.   On: 01/22/2022 07:59   DG Chest Port 1 View  Result Date: 12/31/2021 CLINICAL DATA:  ETT and OG tube placement EXAM: PORTABLE CHEST 1 VIEW COMPARISON:  Chest x-ray dated January 21, 2022 FINDINGS: Interval intubation, ETT tip is proximally 5 cm from the carina. Left IJ line with tip upper SVC. OG tube tip and side port project over the expected area of the stomach. Cardiac and mediastinal contours are unchanged. Status post median sternotomy and CABG. Diffuse bilateral interstitial opacities, likely due to pulmonary edema. Small right pleural effusion. Evidence of pneumothorax. IMPRESSION: 1. Interval intubation, ETT tip is proximally 5 cm from the carina. 2. OG tube tip and side port project over the stomach. 3. Left IJ line with tip in the upper SVC. 4. Unchanged pulmonary edema. Electronically Signed   By: Yetta Glassman M.D.   On: 01/15/2022 14:25   DG Abd 1 View  Result Date: 01/22/2022 CLINICAL DATA:  NG tube placement. EXAM: ABDOMEN - 1 VIEW COMPARISON:  None Available. FINDINGS: The NG tube tip is in the body region of the stomach. IMPRESSION: NG tube  tip is in the body region of the stomach. Electronically Signed   By: Marijo Sanes M.D.   On: 01/10/2022 14:23   [place] EKG SITE RITE  Result Date: 12/25/2021 If Site Rite image not attached, placement could not be confirmed due to current cardiac rhythm.  [place] EKG SITE RITE  Result Date: 01/20/2022 If Site Rite image not attached, placement could not be confirmed due to current cardiac rhythm.  DG Chest Port 1 View  Result Date: 01/09/2022 CLINICAL DATA:  Chest pain and shortness of breath EXAM: PORTABLE CHEST 1 VIEW COMPARISON:  Radiographs 01/05/2022 FINDINGS: Stable cardiomediastinal silhouette. Aortic atherosclerotic calcification. Sternotomy. Increased bilateral diffuse hazy interstitial and airspace opacities greatest in the lower lungs. Increased bilateral layering pleural effusions. No acute osseous abnormality. IMPRESSION: Findings compatible with worsening congestive heart failure. Electronically Signed   By: Placido Sou M.D.   On: 12/28/2021 03:25   ECHOCARDIOGRAM COMPLETE  Result Date: 01/20/2022    ECHOCARDIOGRAM REPORT   Patient Name:   JASMON GRAFFAM Date of Exam: 01/20/2022 Medical Rec #:  [id number]         Height:       68.0 in Accession #:    [id number]        Weight:       182.1 lb Date of Birth:  06/06/1945        BSA:          1.964 m Patient Age:    73 years          BP:  119/36 mmHg Patient Gender: M                 HR:           101 bpm. Exam Location:  [facility] Procedure: 2D Echo, Color Doppler and Cardiac Doppler Indications:     R06.00 Dyspnea; Elevated troponin  History:         Patient has prior history of Echocardiogram examinations, most                  recent 07/18/2020. CHF, CAD, COPD and PVD; Risk Factors:Current                  Smoker, Hypertension and Dyslipidemia.  Sonographer:     Charmayne Sheer Referring Phys:  [id number] AMY N COX Diagnosing Phys: Yolonda Kida MD IMPRESSIONS  1. Ant/Apical/Lateral Hypo.  2. EF=35-40%.  3. Left ventricular ejection  fraction, by estimation, is 35 to 40%. The left ventricle has moderately decreased function. The left ventricle demonstrates regional wall motion abnormalities (see scoring diagram/findings for description). The left ventricular internal cavity size was moderately dilated. There is mild asymmetric left ventricular hypertrophy of the septal segment. Left ventricular diastolic parameters are consistent with Grade III diastolic dysfunction (restrictive).  4. Right ventricular systolic function is low normal. The right ventricular size is mildly enlarged. Mildly increased right ventricular wall thickness.  5. Left atrial size was mild to moderately dilated.  6. Right atrial size was mildly dilated.  7. The mitral valve is grossly normal. Mild mitral valve regurgitation.  8. Tricuspid valve regurgitation is mild to moderate.  9. The aortic valve is calcified. There is moderate calcification of the aortic valve. There is moderate thickening of the aortic valve. Aortic valve regurgitation is mild to moderate. Mild aortic valve stenosis. Conclusion(s)/Recommendation(s): Findings consistent with ischemic cardiomyopathy. FINDINGS  Left Ventricle: Left ventricular ejection fraction, by estimation, is 35 to 40%. The left ventricle has moderately decreased function. The left ventricle demonstrates regional wall motion abnormalities. The left ventricular internal cavity size was moderately dilated. There is mild asymmetric left ventricular hypertrophy of the septal segment. Left ventricular diastolic parameters are consistent with Grade III diastolic dysfunction (restrictive). Right Ventricle: The right ventricular size is mildly enlarged. Mildly increased right ventricular wall thickness. Right ventricular systolic function is low normal. Left Atrium: Left atrial size was mild to moderately dilated. Right Atrium: Right atrial size was mildly dilated. Pericardium: There is no evidence of pericardial effusion. Mitral Valve: The  mitral valve is grossly normal. Mild mitral valve regurgitation. Tricuspid Valve: The tricuspid valve is grossly normal. Tricuspid valve regurgitation is mild to moderate. Aortic Valve: The aortic valve is calcified. There is moderate calcification of the aortic valve. There is moderate thickening of the aortic valve. There is mild to moderate aortic valve annular calcification. Aortic valve regurgitation is mild to moderate. Mild aortic stenosis is present. Aortic valve mean gradient measures 13.5 mmHg. Aortic valve peak gradient measures 24.5 mmHg. Aortic valve area, by VTI measures 1.81 cm. Pulmonic Valve: The pulmonic valve was normal in structure. Pulmonic valve regurgitation is not visualized. Aorta: The ascending aorta was not well visualized. IAS/Shunts: No atrial level shunt detected by color flow Doppler. Additional Comments: Ant/Apical/Lateral Hypo. EF=35-40%.  LEFT VENTRICLE PLAX 2D LVIDd:         5.40 cm   Diastology LVIDs:         3.90 cm   LV e' medial:    8.05 cm/s LV PW:  1.10 cm   LV E/e' medial:  16.1 LV IVS:        1.50 cm   LV e' lateral:   10.10 cm/s LVOT diam:     2.00 cm   LV E/e' lateral: 12.9 LV SV:         56 LV SV Index:   29 LVOT Area:     3.14 cm  RIGHT VENTRICLE RV Basal diam:  4.00 cm RV S prime:     5.55 cm/s LEFT ATRIUM             Index        RIGHT ATRIUM           Index LA diam:        4.50 cm 2.29 cm/m   RA Area:     12.80 cm LA Vol (A2C):   57.5 ml 29.28 ml/m  RA Volume:   29.50 ml  15.02 ml/m LA Vol (A4C):   52.1 ml 26.53 ml/m LA Biplane Vol: 59.3 ml 30.19 ml/m  AORTIC VALVE                     PULMONIC VALVE AV Area (Vmax):    1.52 cm      PV Vmax:       0.73 m/s AV Area (Vmean):   1.58 cm      PV Peak grad:  2.1 mmHg AV Area (VTI):     1.81 cm AV Vmax:           247.50 cm/s AV Vmean:          163.000 cm/s AV VTI:            0.312 m AV Peak Grad:      24.5 mmHg AV Mean Grad:      13.5 mmHg LVOT Vmax:         120.00 cm/s LVOT Vmean:        82.100 cm/s LVOT  VTI:          0.179 m LVOT/AV VTI ratio: 0.57  AORTA Ao Root diam: 3.00 cm MITRAL VALVE MV Area (PHT): 6.27 cm     SHUNTS MV Decel Time: 121 msec     Systemic VTI:  0.18 m MV E velocity: 130.00 cm/s  Systemic Diam: 2.00 cm MV A velocity: 55.70 cm/s MV E/A ratio:  2.33 Dwayne D Callwood MD Electronically signed by Yolonda Kida MD Signature Date/Time: 01/20/2022/1:06:32 PM    Final    DG Chest 2 View  Result Date: 01/07/2022 CLINICAL DATA:  Shortness of breath, intermittent chest pain and weakness for 6 weeks. EXAM: CHEST - 2 VIEW COMPARISON:  None available FINDINGS: Previous median sternotomy and CABG procedure. Heart size appears normal. Aortic atherosclerotic calcifications. There is a small right pleural effusion. Diffuse increase interstitial markings are identified throughout both lungs. There is platelike atelectasis in the right base. No airspace consolidation. IMPRESSION: Small right pleural effusion and diffuse increased interstitial markings compatible with congestive heart failure. Electronically Signed   By: Kerby Moors M.D.   On: 01/14/2022 16:24    Microbiology Recent Results (from the past 240 hour(s))  SARS Coronavirus 2 by RT PCR (hospital order, performed in [facility] hospital lab) *cepheid single result test* Anterior Nasal Swab     Status: None   Collection Time: 01/20/22  1:05 AM   Specimen: Anterior Nasal Swab  Result Value Ref Range Status   SARS Coronavirus 2 by RT PCR  NEGATIVE NEGATIVE Final    Comment: (NOTE) SARS-CoV-2 target nucleic acids are NOT DETECTED.  The SARS-CoV-2 RNA is generally detectable in upper and lower respiratory specimens during the acute phase of infection. The lowest concentration of SARS-CoV-2 viral copies this assay can detect is 250 copies / mL. A negative result does not preclude SARS-CoV-2 infection and should not be used as the sole basis for treatment or other patient management decisions.  A negative result may occur  with improper specimen collection / handling, submission of specimen other than nasopharyngeal swab, presence of viral mutation(s) within the areas targeted by this assay, and inadequate number of viral copies (<250 copies / mL). A negative result must be combined with clinical observations, patient history, and epidemiological information.  Fact Sheet for Patients:   https://www.patel.info/  Fact Sheet for Healthcare Providers: https://hall.com/  This test is not yet approved or  cleared by the [place] FDA and has been authorized for detection and/or diagnosis of SARS-CoV-2 by FDA under an Emergency Use Authorization (EUA).  This EUA will remain in effect (meaning this test can be used) for the duration of the COVID-19 declaration under Section 564(b)(1) of the Act, 21 U.S.C. section 360bbb-3(b)(1), unless the authorization is terminated or revoked sooner.  Performed at [facility], [address] [place], [place] [address]   MRSA Next Gen by PCR, Nasal     Status: None   Collection Time: 01/20/22  2:20 AM   Specimen: Nasal Mucosa; Nasal Swab  Result Value Ref Range Status   MRSA by PCR Next Gen NOT DETECTED NOT DETECTED Final    Comment: (NOTE) The GeneXpert MRSA Assay (FDA approved for NASAL specimens only), is one component of a comprehensive MRSA colonization surveillance program. It is not intended to diagnose MRSA infection nor to guide or monitor treatment for MRSA infections. Test performance is not FDA approved in patients less than 67 years old. Performed at [facility], [address] [place], [place] [address]     Lab Basic Metabolic Panel: Recent Labs  Lab 12/29/2021 1550 01/20/22 0642 01/08/2022 0519 01/22/22 0128 02-18-22 0308  NA 140 141 142 140 141  K 4.9 4.6 4.9 4.6 4.3  CL 109 110 109 108 109  CO2 22 23 21* 22 17*  GLUCOSE 173* 101* 138* 124* 173*  BUN 95* 86* 93* 94* 95*   CREATININE 1.98* 1.77* 1.91* 2.45* 2.60*  CALCIUM 8.3* 8.0* 7.6* 7.5* 7.1*  MG  --   --  2.7* 2.9* 2.7*  PHOS  --   --   --   --  8.1*   Liver Function Tests: Recent Labs  Lab 12/29/2021 1820 01/05/2022 0519  AST 32 45*  ALT 20 30  ALKPHOS 105 97  BILITOT 0.8 0.8  PROT 6.9 6.4*  ALBUMIN 3.1* 2.8*   No results for input(s): "LIPASE", "AMYLASE" in the last 168 hours. No results for input(s): "AMMONIA" in the last 168 hours. CBC: Recent Labs  Lab 12/29/2021 1550 01/20/22 0642 01/22/2022 0519 01/22/22 0128 02/18/22 0308  WBC 8.4 9.3 10.3 12.1* 16.4*  HGB 13.3 13.2 12.7* 12.4* 12.4*  HCT 41.8 40.2 39.2 38.2* 38.8*  MCV 96.5 95.7 95.4 95.0 95.8  PLT 149* 119* 145* 151 166   Cardiac Enzymes: No results for input(s): "CKTOTAL", "CKMB", "CKMBINDEX", "TROPONINI" in the last 168 hours. Sepsis Labs: Recent Labs  Lab 01/20/22 0642 01/01/2022 0519 01/18/2022 1826 12/30/2021 2301 01/22/22 0128 02/18/2022 0308  WBC 9.3 10.3  --   --  12.1* 16.4*  LATICACIDVEN  --   --  1.7 1.1  --  2.4*    Procedures/Operations  Mechanical Intubation  Left Internal Jugular Central Line Placement Right Radial Arterial Line Placement   Donell Beers, [place] Pager [phone] (please enter 7 digits) PCCM Consult Pager [phone] (please enter 7 digits)

## 2022-01-24 NOTE — Progress Notes (Signed)
NAME:  [name], MRN:  [id number], DOB:  [birth year], LOS: 4 ADMISSION DATE:  01/14/2022, CONSULTATION DATE:  01/18/2022 REFERRING MD:  Dr. Billie Ruddy, CHIEF COMPLAINT:  Shortness of Breath, Cardiogenic shock   Brief Pt Description / Synopsis:  76 y.o. 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  Cardiology recommends transfer for Advanced Heart Failure.  History of Present Illness:  [name] is a 76 year old male with a past medical history significant for CAD status post CABG, atrial fibrillation, nonrheumatic mitral valve regurgitation, status post AVR, hypertension, hyperlipidemia, BPH non-Hodgkin's lymphoma, chronic back pain, anxiety, depression who presented to [facility] ED on 12/27/2021 due to complaints of shortness of breath and intermittent chest pain.  He endorsed symptoms have been progressive over the last 6 weeks.  He also endorsed orthopnea, dyspnea on exertion, and approximately 20 pound weight gain with bilateral lower extremity swelling and edema.  He denied fever, chills, abdominal pain, dysuria.  Initial vitals in the emergency department showed temperature 97.7, respiration rate of 20, heart rate of 101, blood pressure 113/40, now 110/41, SPO2 of 91% on 3 L nasal cannula.   Serum sodium is 140, potassium 4.9, chloride of 109, bicarb 22, BUN 95, serum creatinine 1.98, GFR 35, nonfasting blood glucose 173, WBC 8.4, hemoglobin 13.3, platelets of 149.   BNP was elevated at 4147.3.  High sensitive troponin was 641 and increased to 679.   ED treatment: Fentanyl 50 mcg, furosemide 40 mg IV one-time dose, nicotine patch, ondansetron 4 mg IV  He was admitted by the hospitalist for further work-up and treatment of acute decompensated CHF.  Cardiology was consulted.  Please see "significant hospital events" section  below for full detailed hospital course.   Pertinent  Medical History   Past Medical History:  Diagnosis Date   Aortic stenosis    CAD (coronary artery disease)    Cervicalgia    CHF (congestive heart failure) (HCC)    Chronic bilateral low back pain with right-sided sciatica    COPD (chronic obstructive pulmonary disease) (HCC)    DDD (degenerative disc disease), lumbar    Hyperlipidemia    Hypertension    Large B-cell lymphoma (HCC)    Pain syndrome, chronic    Pre-diabetes    PVD (peripheral vascular disease) (HCC)    Spondylosis, lumbar, with myelopathy    Tobacco abuse      Micro Data:  9/27: SARS-CoV-2 PCR>> negative 9/27: MRSA PCR>> negative  Antimicrobials:  N/A  Significant Hospital Events: Including procedures, antibiotic start and stop dates in addition to other pertinent events   9/26: Admitted by hospitalist for CHF exacerbation.  Cardiology consulted. 9/27: Patient with chest pain, concern for possible NSTEMI.  Placed on heparin drip.  Later developed atrial fibrillation with RVR requiring amiodarone drip. 9/28: Developed hypotension, start on Milrinone due to developing cardiorenal syndrome as per nephrology/Cardiology.  Became hypotensive requiring Levophed, PCCM consulted. Code status changed to LTD Code (NO CPR). Worsening respiratory status requiring intubation. 9/29: Worsening renal function.  Developed A. Fib with RVR, Amiodarone bolus and drip initiated.  Milrinone changed to Dobutamine. Advanced HF team at [facility] declined 9/30: Declined overnight. Increased pressor requirements (40 mcg Levo), increase in FiO2 to 60% and 8 PEEP, worsening creatinine and urine output.  [place] conversation planned with family, recommend transitioning to Comfort measures  Interim History / Subjective:  -Yesterday cardiology recommended transfer  to tertiary center for advanced heart failure ~discussed with [name], who felt patient was not a candidate for mechanical  interventions given his extensive cancer and refusal of treatment for cancer, however they were in agreement to accept the patient to manage current and inotropes and vasopressors ~ however patient's family decided against the transfer, would like to discuss further goals of care today -Declined overnight, significantly more hypotensive requiring doubling of Levophed to 40 mics and down titration of sedation of which patient is significantly working as the ventilator and diaphoretic this morning (increase in FiO2 requirements to 60% and 8 of PEEP, along with worsening AKI and and decreasing urine output -Did not tolerate tube feeds with episode of vomiting -Concern patient has progression of cancer including possible intra-abdominal process that is possibly obstructing venous return ~ too unstable to transfer off of the unit for any imaging at this time -Patient is declining, suffering, and in dying process ~recommend transition to comfort measures  Objective   Blood pressure (!) 90/37, pulse (!) 104, temperature 99.3 F (37.4 C), temperature source Esophageal, resp. rate 17, height 5' 8" (1.727 m), weight 80.4 kg, SpO2 99 %.    Vent Mode: PRVC FiO2 (%):  [40 %-60 %] 60 % Set Rate:  [16 bmp] 16 bmp Vt Set:  [550 mL] 550 mL PEEP:  [5 cmH20-8 cmH20] 8 cmH20 Plateau Pressure:  [20 cmH20-22 cmH20] 22 cmH20   Intake/Output Summary (Last 24 hours) at 02-22-2022 0858 Last data filed at 02-22-22 0441 Gross per 24 hour  Intake 2455.27 ml  Output 1185 ml  Net 1270.27 ml    Filed Weights   01/20/22 0217 01/10/2022 0645 01/22/22 0600  Weight: 82.6 kg 84.6 kg 80.4 kg    Examination: General: Acute on chronically ill-appearing male, laying in bed, intubated and lightly sedated, with mild respiratory distress HENT: Atraumatic, normocephalic, neck supple, positive JVD Lungs: Coarse breath sounds bilaterally, even, Overbreathing the vent, assessory muscle use Cardiovascular: Tachycardia, regular  rhythm, no murmurs, rubs or gallops Abdomen: Soft, nontender, nondistended, no guarding rebound tenderness, bowel sounds positive x4 Extremities: Cool extremities, 3+ edema bilateral lower extremities Neuro: Lightly sedated to a RASS of -1, arouses to voice and moves all extremities to command, no focal deficits noted, pupils PERRLA GU: Foley catheter in place draining dark yellow urine  Resolved Hospital Problem list     Assessment & Plan:   Cardiogenic Shock ~ WORSENING  Concern patient has progression of cancer including possible intra-abdominal process that is possibly obstructing venous return Acute Decompensated combined Systolic & Diastolic CHF Atrial Fibrillation w/ RVR ~ converted back to ST Elevated troponin, demand ischemia vs NSTEMI. PMHx: CAD s/p CABG x2 in 2013, Moderate mitral regurgitation, s/p AVR Echocardiogram 01/20/2022: LVEF 35 to 34%, grade 3 diastolic dysfunction, RV systolic function low normal, mild to moderate tricuspid regurgitation, mild to moderate aortic regurgitation -Continuous cardiac monitoring -Maintain MAP >65 -Cardiology following, appreciate input ~ RECOMMENDING TRANSFER TO HIGHER LEVEL OF CARE FOR ADVANCED HEART FAILURE ~ spoke with [name] pt not a candidate for advanced mechanical interventions -Inotrope's as per Cardiology ~  follow Coox panel -Vasopressors as needed to maintain MAP goal -Heparin gtt and Amiodarone gtt -Trend lactic acid until normalized -HS Troponin peaked at 1476 -Diuresis as renal function and blood pressure permits ~currently holding Lasix drip due to severe hypotension  Acute Hypoxic Respiratory Failure in the setting of CHF ~ WORSENING PMHx: Tobacco abuse -Full vent support, implement lung protective strategies -Plateau pressures less than  30 cm H20 -Wean FiO2 & PEEP as tolerated to maintain O2 sats >92% -Follow intermittent Chest X-ray & ABG as needed -Spontaneous Breathing Trials when respiratory parameters met and  mental status permits -Implement VAP Bundle -Prn Bronchodilators -Diuresis as blood pressure and renal function permits  AKI in setting of Cardiorenal Syndrome ~ WORSENING PMHx: BPH -Monitor I&O's / urinary output -Follow BMP -Ensure adequate renal perfusion -Avoid nephrotoxic agents as able -Replace electrolytes as indicated -Nephrology following, appreciate input  Sedation needs in setting of mechanical ventilation -Maintain a RASS goal of 0 to -1 -Fentanyl and Propofol as needed to maintain RASS goal -Avoid sedating medications as able -Daily wake up assessment    Patient is critically ill with end-stage combined CHF.  High risk for further deterioration, cardiac arrest and death.  Overall long term prognosis is poor.  Pt's family request LTD code status (NO CPR).  Pt is declining, suffering, and in dying process.  Recommend full DNR and transition to comfort measures.    Best Practice (right click and "Reselect all SmartList Selections" daily)   Diet/type: NPO DVT prophylaxis: systemic heparin GI prophylaxis: PPI Lines: Central line and yes and it is still needed, right radial A-line and is still needed Foley:  Yes, and it is still needed Code Status:  limited (NO CPR) Last date of multidisciplinary goals of care discussion [01/22/2022]  9/30: [place] discussion planned for 10:00 am with Patient's wife, daughter, and son at bedside.  Labs   CBC: Recent Labs  Lab 01/03/2022 1550 01/20/22 0642 01/15/2022 0519 01/22/22 0128 01-27-2022 0308  WBC 8.4 9.3 10.3 12.1* 16.4*  HGB 13.3 13.2 12.7* 12.4* 12.4*  HCT 41.8 40.2 39.2 38.2* 38.8*  MCV 96.5 95.7 95.4 95.0 95.8  PLT 149* 119* 145* 151 166     Basic Metabolic Panel: Recent Labs  Lab 12/26/2021 1550 01/20/22 0642 12/26/2021 0519 01/22/22 0128 2022-01-27 0308  NA 140 141 142 140 141  K 4.9 4.6 4.9 4.6 4.3  CL 109 110 109 108 109  CO2 22 23 21* 22 17*  GLUCOSE 173* 101* 138* 124* 173*  BUN 95* 86* 93* 94* 95*   CREATININE 1.98* 1.77* 1.91* 2.45* 2.60*  CALCIUM 8.3* 8.0* 7.6* 7.5* 7.1*  MG  --   --  2.7* 2.9* 2.7*  PHOS  --   --   --   --  8.1*    GFR: Estimated Creatinine Clearance: 24.1 mL/min (A) (by C-G formula based on SCr of 2.6 mg/dL (H)). Recent Labs  Lab 01/20/22 0642 01/15/2022 0519 01/15/2022 1826 01/02/2022 2301 01/22/22 0128 2022-01-27 0308  WBC 9.3 10.3  --   --  12.1* 16.4*  LATICACIDVEN  --   --  1.7 1.1  --  2.4*     Liver Function Tests: Recent Labs  Lab 01/05/2022 1820 12/29/2021 0519  AST 32 45*  ALT 20 30  ALKPHOS 105 97  BILITOT 0.8 0.8  PROT 6.9 6.4*  ALBUMIN 3.1* 2.8*    No results for input(s): "LIPASE", "AMYLASE" in the last 168 hours. No results for input(s): "AMMONIA" in the last 168 hours.  ABG    Component Value Date/Time   PHART 7.34 (L) 2022-01-27 0226   PCO2ART 34 2022-01-27 0226   PO2ART 63 (L) January 27, 2022 0226   HCO3 18.3 (L) 2022/01/27 0226   ACIDBASEDEF 6.6 (H) Jan 27, 2022 0226   O2SAT 90.7 2022/01/27 0226     Coagulation Profile: No results for input(s): "INR", "PROTIME" in the last 168 hours.  Cardiac Enzymes: No results for input(s): "CKTOTAL", "CKMB", "CKMBINDEX", "TROPONINI" in the last 168 hours.  HbA1C: No results found for: "HGBA1C"  CBG: Recent Labs  Lab 01/02/2022 1638 01/20/2022 1933 01/03/2022 2340 01/22/22 0332 01/22/22 0751  GLUCAP 122* 117* 110* 121* 105*     Review of Systems:   Unable to assess due to intubation/sedation/critical illness   Past Medical History:  He,  has a past medical history of Aortic stenosis, CAD (coronary artery disease), Cervicalgia, CHF (congestive heart failure) (HCC), Chronic bilateral low back pain with right-sided sciatica, COPD (chronic obstructive pulmonary disease) ([place]), DDD (degenerative disc disease), lumbar, Hyperlipidemia, Hypertension, Large B-cell lymphoma ([place]), Pain syndrome, chronic, Pre-diabetes, PVD (peripheral vascular disease) ([place]), Spondylosis, lumbar, with myelopathy,  and Tobacco abuse.   Surgical History:   Past Surgical History:  Procedure Laterality Date   cardiac stents     CORONARY ANGIOPLASTY     CORONARY ARTERY BYPASS GRAFT     VSD REPAIR       Social History:   reports that he has been smoking cigarettes. He has a 40.00 pack-year smoking history. He has never used smokeless tobacco. He reports that he does not drink alcohol and does not use drugs.   Family History:  His family history includes Arthritis in his mother; Asthma in his son; Early death in his father; Heart disease in his father and mother; Thyroid disease in his sister.   Allergies Allergies  Allergen Reactions   Celebrex [Celecoxib] Other (See Comments) and Shortness Of Breath     CHEST PAIN Chest pain   Carvedilol Other (See Comments)    Extreme fatigue   Penicillins Other (See Comments) and Rash    unknown     Home Medications  Prior to Admission medications   Medication Sig Start Date End Date Taking? Authorizing Provider  atorvastatin (LIPITOR) 20 MG tablet Take 20 mg by mouth daily.  05/30/15  Yes [provider]  busPIRone (BUSPAR) 15 MG tablet Take 15 mg by mouth 3 (three) times daily.  05/30/15  Yes [provider]  citalopram (CELEXA) 20 MG tablet Take 20 mg by mouth daily.   Yes [provider]  diazepam (VALIUM) 5 MG tablet One pill 45-60 mins prior to scan; 1 pill 15 mins prior if needed. Patient taking differently: Take 5 mg by mouth every 6 (six) hours as needed. One pill 45-60 mins prior to scan; 1 pill 15 mins prior if needed. 07/17/20  Yes Cammie Sickle, MD  fentaNYL (DURAGESIC) 100 MCG/HR 1 patch every other day. 06/18/20  Yes [provider]  furosemide (LASIX) 40 MG tablet Take 40 mg by mouth daily. 01/18/22  Yes [provider]  gemfibrozil (LOPID) 600 MG tablet Take 600 mg by mouth 2 (two) times daily before a meal.   Yes [provider]  lisinopril (PRINIVIL,ZESTRIL) 10 MG tablet Take 30 mg  by mouth daily. (take 10 mg in the am and 20 mg in the evening to equal 30 mg daily)   Yes [provider]  metoprolol tartrate (LOPRESSOR) 25 MG tablet Take 25 mg by mouth 2 (two) times daily.   Yes [provider]  tamsulosin (FLOMAX) 0.4 MG CAPS capsule Take 0.4 mg by mouth daily.   Yes [provider]  butalbital-aspirin-caffeine [place] Chain) 50-325-40 MG capsule Take 1 capsule by mouth every 6 (six) hours as needed for headache.     [provider]  docusate sodium (COLACE) 100 MG capsule Take 100 mg by  mouth 3 (three) times daily.    [provider]  levofloxacin (LEVAQUIN) 500 MG tablet Take 500 mg by mouth daily. Patient not taking: Reported on 12/26/2021 01/08/22   [provider]  Oxycodone HCl 10 MG TABS Take 10 mg by mouth every 4 (four) hours as needed. 01/08/22   [provider]  trolamine salicylate (ASPERCREME) 10 % cream Apply 1 Application topically as needed.    [provider]     Critical care time: 40 minutes     Darel Hong, AGACNP-BC [facility] epic messenger for cross cover needs If after hours, please call E-link

## 2022-01-24 NOTE — Progress Notes (Addendum)
Patient evaluated by Dr. Dillard Cannon, extubations orders placed, RT at bedside and extubated patient at 1535, e-link notified honor bridge. Patient placed on 2L of [place] after extubation. Comfort medications continue, per order all other gtts stopped at initiation of comfort medication administration.  Patients family brought back to the bedside by chaplain.  Patient passed away at 72 with family and chaplain present, passing was verified via auscultation for 1 minute by myself and the charge nurse, family updated this nurse upon leaving.  postmortom care done and all required paperwork.

## 2022-01-24 NOTE — IPAL (Signed)
  Interdisciplinary Goals of Care Family Meeting   Date carried out: 02-04-22  Location of the meeting: Conference room  Member's involved: Physician, Nurse Practitioner, Bedside Registered Nurse, and Family Member or next of kin  Durable Power of Attorney or acting medical decision maker: Pt's wife [name], son [name], daughter [name]  Discussion: We discussed goals of care for [name] . Discussed conversations with [name] team that pt not a candidate for advanced mechanical support for heart failure.  Discussed significant decline overnight including: increased vent support with worsening CXR, doubling or pressors, and worsening renal function and declining urine output.  Discussed suspicion of progression of cancer including possible intra-abdominal process that is possibly obstructing venous return.  They understand that pt is suffering and in the dying process.  They state he would not want any of this, and they are ready to withdraw care and transition to [place].  They just want to ensure that he is not suffering or in any pain.  There is 1 sister who lives in [place] who they would like to visit before proceeding with withdrawal today.  They will let nursing know when they are ready.   Code status: Full DNR  Disposition: In-patient comfort care  Time spent for the meeting: 25 minutes    Darel Hong, AGACNP-BC [place] epic messenger for cross cover needs If after hours, please call E-link  Bradly Bienenstock, NP  02/04/22, 11:16 AM

## 2022-01-24 NOTE — Plan of Care (Signed)
Continuing with plan of care at this time.

## 2022-01-24 NOTE — Progress Notes (Signed)
[facility]  ROUNDING NOTE   Subjective:   Patient seen and evaluated at bedside in ICU, he remains on ventilator support with ET tube He remains dobutamine drip, heparin drip, amiodarone drip as well as Levophed.  Patient had urine output of 1985 mls over the preceding 24 hours  Objective:  Vital signs in last 24 hours:  Temp:  [97.5 F (36.4 C)-99.5 F (37.5 C)] 99.3 F (37.4 C) (09/30 1100) Pulse Rate:  [97-142] 102 (09/30 1100) Resp:  [14-26] 16 (09/30 1100) BP: (81-115)/(37-50) 98/44 (09/30 1100) SpO2:  [89 %-99 %] 96 % (09/30 1100) Arterial Line BP: (82-137)/(29-73) 114/39 (09/30 1100) FiO2 (%):  [40 %-60 %] 60 % (09/30 1100)  Weight change:  Filed Weights   01/20/22 0217 01/11/2022 0645 01/22/22 0600  Weight: 82.6 kg 84.6 kg 80.4 kg    Intake/Output: I/O last 3 completed shifts: In: 3282.2 [I.V.:2605.1; NG/GT:404; IV Piggyback:273.1] Out: 2985 [Urine:2435; Emesis/NG output:550]   Intake/Output this shift:  Total I/O In: 732.4 [I.V.:579.4; NG/GT:153] Out: -   Physical Exam: General: Critically ill-appearing  Head: Normocephalic, atraumatic.  Eyes: Anicteric  Lungs:  Intubation with vent support  Heart: Regular, tachycardic  Abdomen:  Soft, nontender  Extremities: 2+ peripheral edema.  Neurologic: Nonfocal, moving all four extremities  Skin: No lesions  Access: None    Basic Metabolic Panel: Recent Labs  Lab 01/06/2022 1550 01/20/22 0642 12/27/2021 0519 01/22/22 0128 14-Feb-2022 0308  NA 140 141 142 140 141  K 4.9 4.6 4.9 4.6 4.3  CL 109 110 109 108 109  CO2 22 23 21* 22 17*  GLUCOSE 173* 101* 138* 124* 173*  BUN 95* 86* 93* 94* 95*  CREATININE 1.98* 1.77* 1.91* 2.45* 2.60*  CALCIUM 8.3* 8.0* 7.6* 7.5* 7.1*  MG  --   --  2.7* 2.9* 2.7*  PHOS  --   --   --   --  8.1*     Liver Function Tests: Recent Labs  Lab 01/08/2022 1820 12/30/2021 0519  AST 32 45*  ALT 20 30  ALKPHOS 105 97  BILITOT 0.8 0.8  PROT 6.9 6.4*  ALBUMIN 3.1* 2.8*     No results for input(s): "LIPASE", "AMYLASE" in the last 168 hours. No results for input(s): "AMMONIA" in the last 168 hours.  CBC: Recent Labs  Lab 01/11/2022 1550 01/20/22 0642 12/30/2021 0519 01/22/22 0128 Feb 14, 2022 0308  WBC 8.4 9.3 10.3 12.1* 16.4*  HGB 13.3 13.2 12.7* 12.4* 12.4*  HCT 41.8 40.2 39.2 38.2* 38.8*  MCV 96.5 95.7 95.4 95.0 95.8  PLT 149* 119* 145* 151 166     Cardiac Enzymes: No results for input(s): "CKTOTAL", "CKMB", "CKMBINDEX", "TROPONINI" in the last 168 hours.  BNP: Invalid input(s): "POCBNP"  CBG: Recent Labs  Lab 01/18/2022 1638 01/22/2022 1933 12/27/2021 2340 01/22/22 0332 01/22/22 0751  GLUCAP 122* 117* 110* 121* 105*     Microbiology: Results for orders placed or performed during the hospital encounter of 01/01/2022  SARS Coronavirus 2 by RT PCR (hospital order, performed in [facility] hospital lab) *cepheid single result test* Anterior Nasal Swab     Status: None   Collection Time: 01/20/22  1:05 AM   Specimen: Anterior Nasal Swab  Result Value Ref Range Status   SARS Coronavirus 2 by RT PCR NEGATIVE NEGATIVE Final    Comment: (NOTE) SARS-CoV-2 target nucleic acids are NOT DETECTED.  The SARS-CoV-2 RNA is generally detectable in upper and lower respiratory specimens during the acute phase of infection. The lowest concentration  of SARS-CoV-2 viral copies this assay can detect is 250 copies / mL. A negative result does not preclude SARS-CoV-2 infection and should not be used as the sole basis for treatment or other patient management decisions.  A negative result may occur with improper specimen collection / handling, submission of specimen other than nasopharyngeal swab, presence of viral mutation(s) within the areas targeted by this assay, and inadequate number of viral copies (<250 copies / mL). A negative result must be combined with clinical observations, patient history, and epidemiological information.  Fact Sheet for Patients:    https://www.patel.info/  Fact Sheet for Healthcare Providers: https://hall.com/  This test is not yet approved or  cleared by the [place] FDA and has been authorized for detection and/or diagnosis of SARS-CoV-2 by FDA under an Emergency Use Authorization (EUA).  This EUA will remain in effect (meaning this test can be used) for the duration of the COVID-19 declaration under Section 564(b)(1) of the Act, 21 U.S.C. section 360bbb-3(b)(1), unless the authorization is terminated or revoked sooner.  Performed at [facility], [address] [place], [place] [address]   MRSA Next Gen by PCR, Nasal     Status: None   Collection Time: 01/20/22  2:20 AM   Specimen: Nasal Mucosa; Nasal Swab  Result Value Ref Range Status   MRSA by PCR Next Gen NOT DETECTED NOT DETECTED Final    Comment: (NOTE) The GeneXpert MRSA Assay (FDA approved for NASAL specimens only), is one component of a comprehensive MRSA colonization surveillance program. It is not intended to diagnose MRSA infection nor to guide or monitor treatment for MRSA infections. Test performance is not FDA approved in patients less than 33 years old. Performed at [facility], [address] [place],  [address]     Coagulation Studies: No results for input(s): "LABPROT", "INR" in the last 72 hours.  Urinalysis: No results for input(s): "COLORURINE", "LABSPEC", "PHURINE", "GLUCOSEU", "HGBUR", "BILIRUBINUR", "KETONESUR", "PROTEINUR", "UROBILINOGEN", "NITRITE", "LEUKOCYTESUR" in the last 72 hours.  Invalid input(s): "APPERANCEUR"    Imaging: DG Chest 1 View  Result Date: Feb 05, 2022 CLINICAL DATA:  Acute respiratory failure EXAM: PORTABLE CHEST 1 VIEW COMPARISON:  01/22/2022 FINDINGS: Cardiac shadow is enlarged but stable. Postsurgical changes are again seen. Endotracheal tube, gastric catheter and left jugular central line are again seen and stable.  Persistent changes of CHF for noted with increasing right-sided pleural effusion. There is likely some underlying increased consolidation in the right base as well. No other focal abnormality is noted. IMPRESSION: Stable CHF with increasing right-sided pleural effusion and basilar consolidation. Electronically Signed   By: Inez Catalina M.D.   On: 2022/02/05 03:21   DG Chest Port 1 View  Result Date: 01/22/2022 CLINICAL DATA:  Acute respiratory failure with hypoxia EXAM: PORTABLE CHEST 1 VIEW COMPARISON:  Prior chest x-ray 12/31/2021 FINDINGS: The patient remains intubated. The tip of the endotracheal tube is 4.5 cm above the carina. Left IJ central venous catheter remains in unchanged position. The tip overlies the upper SVC. Gastric tube is well position. The proximal side-hole of the tube overlies the gastric body. Persistent cardiomegaly and pulmonary vascular congestion with mild interstitial edema. Bibasilar airspace opacities likely reflect a combination of pleural fluid and atelectasis. No significant interval change in the appearance of the chest. Atherosclerotic calcifications again noted in the transverse aorta. Patient is status post median sternotomy with evidence of multivessel CABG. IMPRESSION: 1. No interval change in the appearance of the chest. 2. Stable and satisfactory support  apparatus. 3. Persistent CHF with probable bilateral pleural effusions and bibasilar atelectasis. Electronically Signed   By: Jacqulynn Cadet M.D.   On: 01/22/2022 07:59   DG Chest Port 1 View  Result Date: 01/03/2022 CLINICAL DATA:  ETT and OG tube placement EXAM: PORTABLE CHEST 1 VIEW COMPARISON:  Chest x-ray dated January 21, 2022 FINDINGS: Interval intubation, ETT tip is proximally 5 cm from the carina. Left IJ line with tip upper SVC. OG tube tip and side port project over the expected area of the stomach. Cardiac and mediastinal contours are unchanged. Status post median sternotomy and CABG. Diffuse  bilateral interstitial opacities, likely due to pulmonary edema. Small right pleural effusion. Evidence of pneumothorax. IMPRESSION: 1. Interval intubation, ETT tip is proximally 5 cm from the carina. 2. OG tube tip and side port project over the stomach. 3. Left IJ line with tip in the upper SVC. 4. Unchanged pulmonary edema. Electronically Signed   By: Yetta Glassman M.D.   On: 01/05/2022 14:25   DG Abd 1 View  Result Date: 01/04/2022 CLINICAL DATA:  NG tube placement. EXAM: ABDOMEN - 1 VIEW COMPARISON:  None Available. FINDINGS: The NG tube tip is in the body region of the stomach. IMPRESSION: NG tube tip is in the body region of the stomach. Electronically Signed   By: Marijo Sanes M.D.   On: 12/28/2021 14:23     Medications:    sodium chloride     sodium chloride     amiodarone 30 mg/hr (February 08, 2022 1111)   DOBUTamine 6.5 mcg/kg/min (2022-02-08 1111)   heparin 1,400 Units/hr (February 08, 2022 1111)   HYDROmorphone     LORazepam (ATIVAN) 50 mg in dextrose 5 % 50 mL (1 mg/mL) infusion     norepinephrine (LEVOPHED) Adult infusion 40 mcg/min (Feb 08, 2022 1111)   propofol (DIPRIVAN) infusion 10 mcg/kg/min (08-Feb-2022 1111)    Chlorhexidine Gluconate Cloth  6 each Topical Q0600   docusate  100 mg Per Tube BID   fentaNYL  1 patch Transdermal QODAY   hydrocortisone sod succinate (SOLU-CORTEF) inj  100 mg Intravenous Q12H   mouth rinse  15 mL Mouth Rinse Q2H   polyethylene glycol  17 g Per Tube Daily   sodium chloride flush  3 mL Intravenous Q12H   acetaminophen **OR** acetaminophen, glycopyrrolate **OR** glycopyrrolate **OR** glycopyrrolate, haloperidol lactate, hydrALAZINE, HYDROmorphone, LORazepam, ondansetron **OR** ondansetron (ZOFRAN) IV, mouth rinse, mouth rinse, polyvinyl alcohol, senna-docusate  Assessment/ Plan:  [name] is a 76 y.o.  male with past medical history including BPH, hypertension, mitral valve regurgitation, anxiety and depression, hyperlipidemia, CAD with CABG, atrial  fibrillation, non-Hodgkin's lymphoma, and chronic back pain, who was admitted to [facility] on 01/18/2022 for NSTEMI (non-ST elevated myocardial infarction) ([place]) [I21.4] Acute exacerbation of CHF (congestive heart failure) ([place]) [I50.9] Acute congestive heart failure, unspecified heart failure type ([place]) [I50.9]   Acute kidney injury on chronic kidney disease stage II.  Baseline creatinine 1.0 with GFR 73 on 12/10/2021.  Acute kidney injury secondary to cardiorenal syndrome.  No IV contrast exposure.  S/P lasix drip.  Creatinine remains elevated with latest creatinine level 2.6.  However patient noted to have improvement in urine output. No acute need for dialysis at present. Continue current treatments.   Lab Results  Component Value Date   CREATININE 2.60 (H) Feb 08, 2022   CREATININE 2.45 (H) 01/22/2022   CREATININE 1.91 (H) 01/16/2022    Intake/Output Summary (Last 24 hours) at 02-08-22 1244 Last data filed at Feb 08, 2022 1111 Gross per 24 hour  Intake 2651.38 ml  Output 660 ml  Net 1991.38 ml    2.  Acute on chronic systolic heart failure.  Echo completed on 01/20/2022 shows EF 35 to 40% with a mild asymmetric LVH and grade 3 diastolic dysfunction.  Also showing a moderate calcification of the aortic valve. S/P lasix drip.  Currently on dobutamine drip.  Continue with cardiology follow-up  3.  Acute respiratory failure likely due to volume overload with decompensation.  Remains on ventilator support with ET tube.  Continue with pulmonary follow-up.      LOS: [place] 2023-01-2411:44 PM

## 2022-01-24 NOTE — Progress Notes (Signed)
Patient was agitated, sweating, and grimacing upon entering the room.  NP, Darlyn Chamber, at bedside and gave orders to bolus '1mg'$  from pump, restart propofol up to 36mg, and administer a second dose of versed '2mg'$ . Patient's oxygen saturation began to drop to the high 80s, RT at bedside and increase oxygen to 60%.  Will continue to monitor patient.

## 2022-01-24 NOTE — Progress Notes (Signed)
SUBJECTIVE: Patient remains intubated   Vitals:   02/11/22 1015 2022/02/11 1030 Feb 11, 2022 1045 February 11, 2022 1100  BP:    (!) 98/44  Pulse: (!) 102 (!) 104 (!) 103 (!) 102  Resp: '16 18 15 16  '$ Temp: 99 F (37.2 C) 99.1 F (37.3 C) 99.3 F (37.4 C) 99.3 F (37.4 C)  TempSrc: Esophageal Esophageal Esophageal Esophageal  SpO2: 97% 97% 97% 96%  Weight:      Height:        Intake/Output Summary (Last 24 hours) at 02-11-2022 1145 Last data filed at 2022-02-11 1111 Gross per 24 hour  Intake 3187.69 ml  Output 1185 ml  Net 2002.69 ml    LABS: Basic Metabolic Panel: Recent Labs    01/22/22 0128 02/11/2022 0308  NA 140 141  K 4.6 4.3  CL 108 109  CO2 22 17*  GLUCOSE 124* 173*  BUN 94* 95*  CREATININE 2.45* 2.60*  CALCIUM 7.5* 7.1*  MG 2.9* 2.7*  PHOS  --  8.1*   Liver Function Tests: Recent Labs    01/18/2022 0519  AST 45*  ALT 30  ALKPHOS 97  BILITOT 0.8  PROT 6.4*  ALBUMIN 2.8*   No results for input(s): "LIPASE", "AMYLASE" in the last 72 hours. CBC: Recent Labs    01/22/22 0128 2022-02-11 0308  WBC 12.1* 16.4*  HGB 12.4* 12.4*  HCT 38.2* 38.8*  MCV 95.0 95.8  PLT 151 166   Cardiac Enzymes: No results for input(s): "CKTOTAL", "CKMB", "CKMBINDEX", "TROPONINI" in the last 72 hours. BNP: Invalid input(s): "POCBNP" D-Dimer: No results for input(s): "DDIMER" in the last 72 hours. Hemoglobin A1C: No results for input(s): "HGBA1C" in the last 72 hours. Fasting Lipid Panel: Recent Labs    01/22/22 0501  TRIG 76   Thyroid Function Tests: No results for input(s): "TSH", "T4TOTAL", "T3FREE", "THYROIDAB" in the last 72 hours.  Invalid input(s): "FREET3" Anemia Panel: No results for input(s): "VITAMINB12", "FOLATE", "FERRITIN", "TIBC", "IRON", "RETICCTPCT" in the last 72 hours.   PHYSICAL EXAM General: Well developed, well nourished, in no acute distress HEENT:  Normocephalic and atramatic Neck:  No JVD.  Lungs: Clear bilaterally to auscultation and  percussion. Heart: HRRR . Normal S1 and S2 without gallops or murmurs.  Abdomen: Bowel sounds are positive, abdomen soft and non-tender  Msk:  Back normal, normal gait. Normal strength and tone for age. Extremities: No clubbing, cyanosis or edema.   Neuro: Alert and oriented X 3. Psych:  Good affect, responds appropriately  TELEMETRY: Atrial fibrillation with controlled ventricular rate  ASSESSMENT AND PLAN: Respiratory failure due to acute systolic and diastolic heart failure with a history of CABG and malignant lymphoma with non-STEMI.  Agree with continuing the patient on IV Lasix And amiodarone. 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

## 2022-01-24 DEATH — deceased

## 2022-01-25 LAB — BLOOD GAS, ARTERIAL
Acid-base deficit: 8.2 mmol/L — ABNORMAL HIGH (ref 0.0–2.0)
Acid-base deficit: 3.6 mmol/L — ABNORMAL HIGH (ref 0.0–2.0)
Bicarbonate: 16 mmol/L — ABNORMAL LOW (ref 20.0–28.0)
Bicarbonate: 21.5 mmol/L (ref 20.0–28.0)
FIO2: 90 %
FIO2: 40 %
MECHVT: 550 mL
Mechanical Rate: 16
O2 Content: 50 L/min
O2 Saturation: 93.8 %
O2 Saturation: 94.2 %
PEEP: 5 cmH2O
Patient temperature: 37
Patient temperature: 37
pCO2 arterial: 29 mmHg — ABNORMAL LOW (ref 32–48)
pCO2 arterial: 38 mmHg (ref 32–48)
pH, Arterial: 7.35 (ref 7.35–7.45)
pH, Arterial: 7.36 (ref 7.35–7.45)
pO2, Arterial: 67 mmHg — ABNORMAL LOW (ref 83–108)
pO2, Arterial: 70 mmHg — ABNORMAL LOW (ref 83–108)

## 2022-01-25 LAB — BLOOD GAS, VENOUS
Acid-base deficit: 6.8 mmol/L — ABNORMAL HIGH (ref 0.0–2.0)
Bicarbonate: 20.2 mmol/L (ref 20.0–28.0)
FIO2: 60 %
O2 Saturation: 72.8 %
PEEP: 5 cmH2O
Patient temperature: 37
pCO2, Ven: 45 mmHg (ref 44–60)
pH, Ven: 7.26 (ref 7.25–7.43)
pO2, Ven: 49 mmHg — ABNORMAL HIGH (ref 32–45)

## 2022-01-27 ENCOUNTER — Ambulatory Visit: Payer: Medicare Other | Admitting: Family

## 2022-05-12 IMAGING — US US SOFT TISSUE HEAD/NECK
1 series · 14 of 25 positions shown · non-contrast
Comparison: None.

CLINICAL DATA: Multiple neck masses noted over the last 3 weeks.

EXAM:
ULTRASOUND OF HEAD/NECK SOFT TISSUES
TECHNIQUE: Ultrasound examination of the head and neck soft tissues was
performed in the area of clinical concern.

[Series 1: us soft tissue head & neck (non-thyroid) · 14 of 38 slices shown]
[im 1/38]
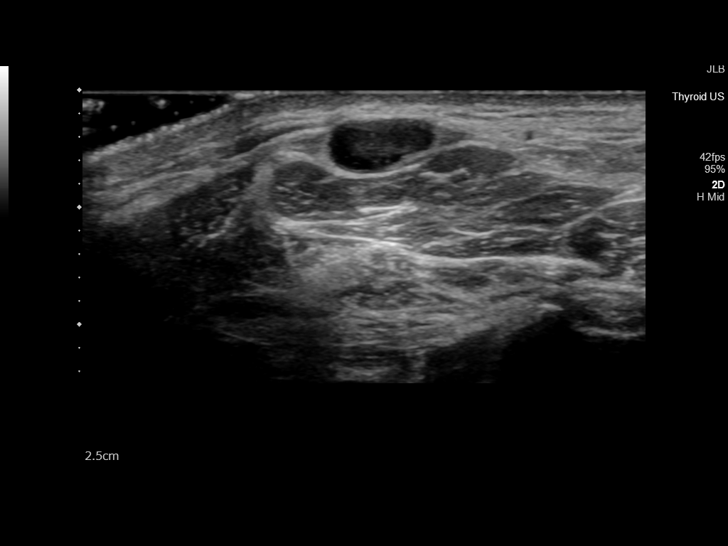
[im 4/38]
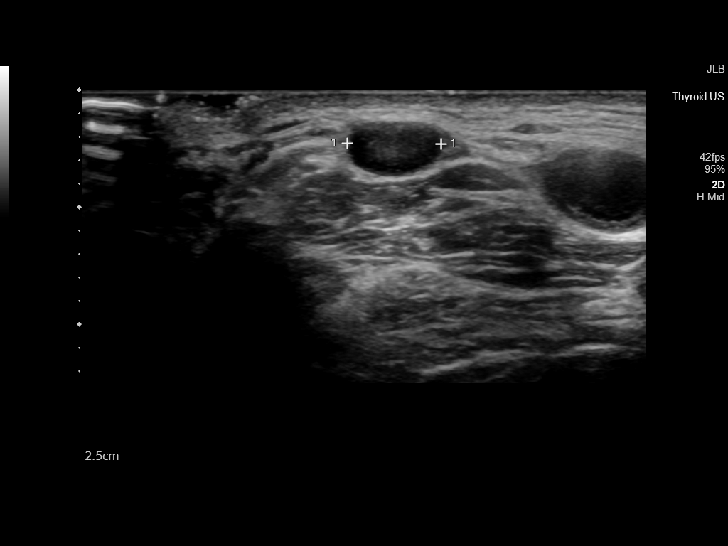
[im 7/38]
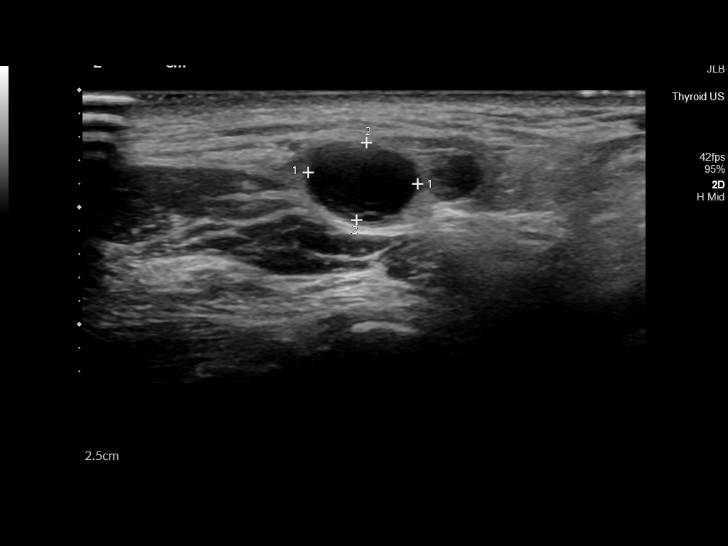
[im 10/38]
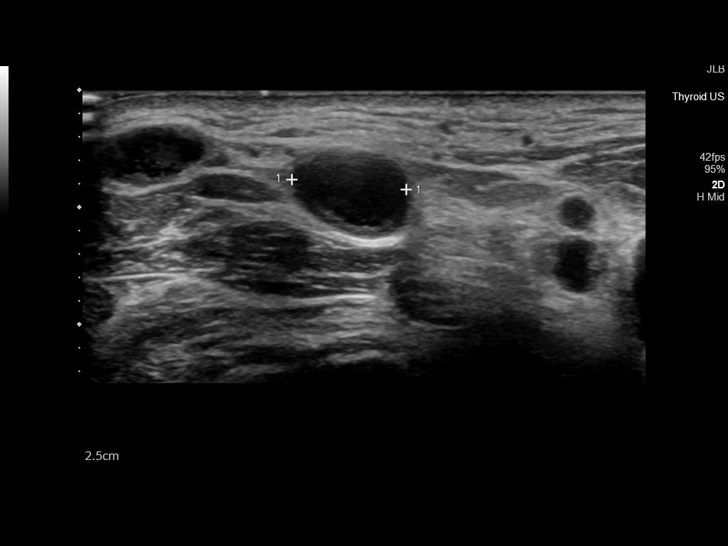
[im 13/38]
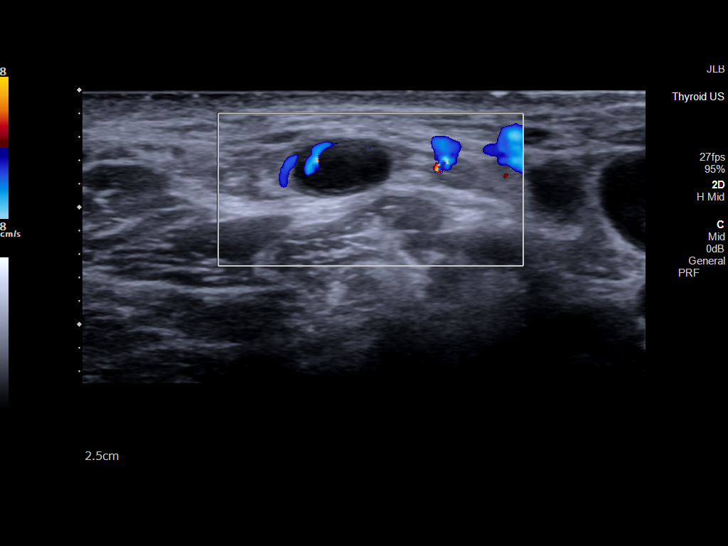
[im 14/38]
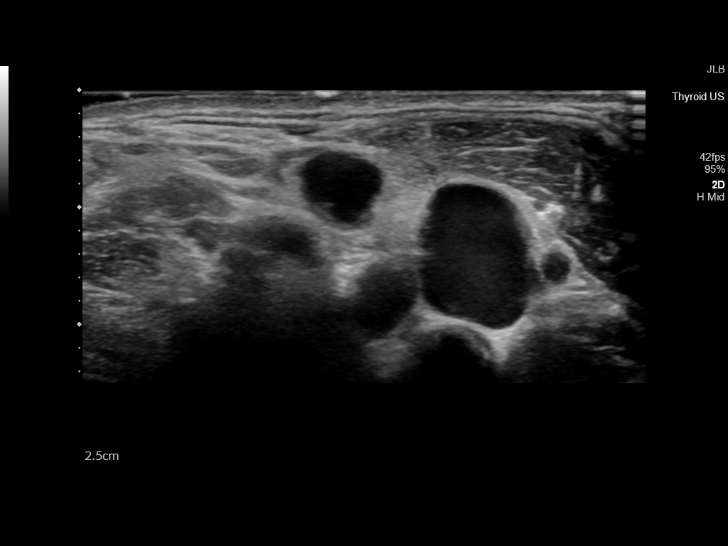
[im 17/38]
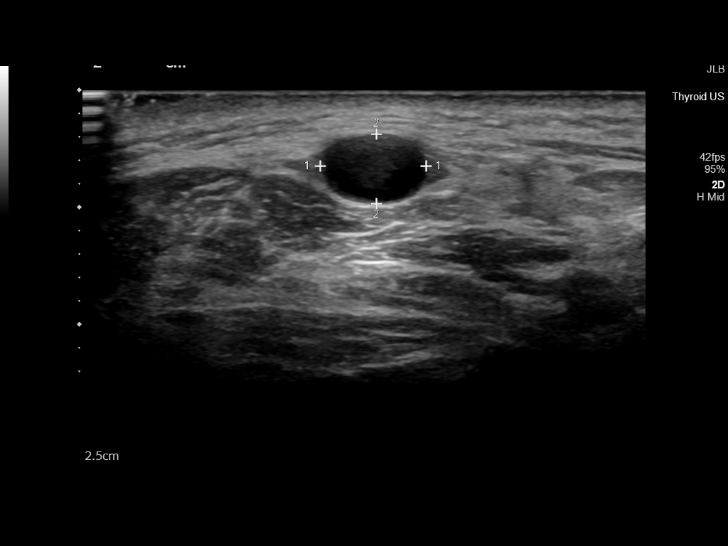
[im 21/38]
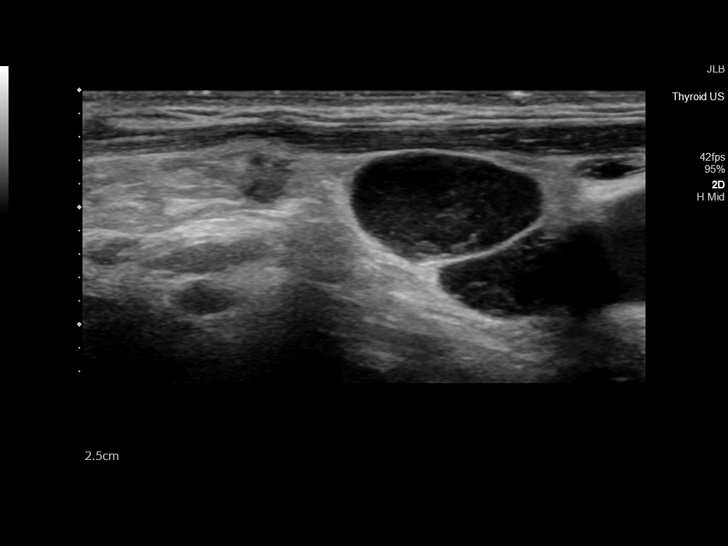
[im 24/38]
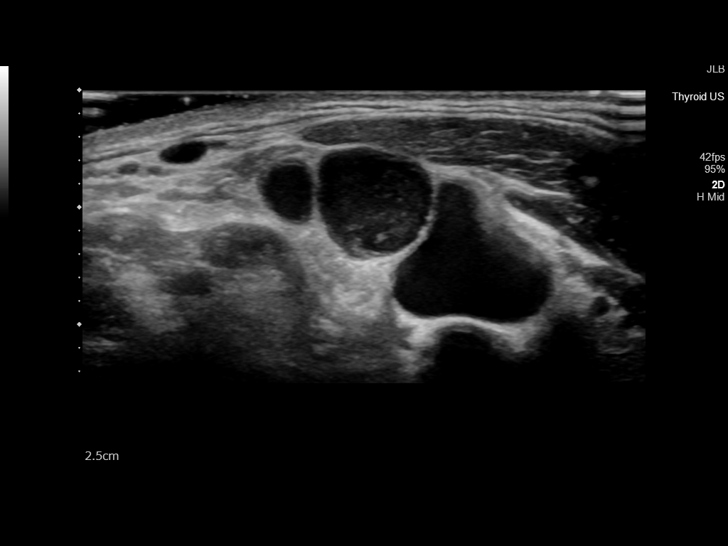
[im 25/38]
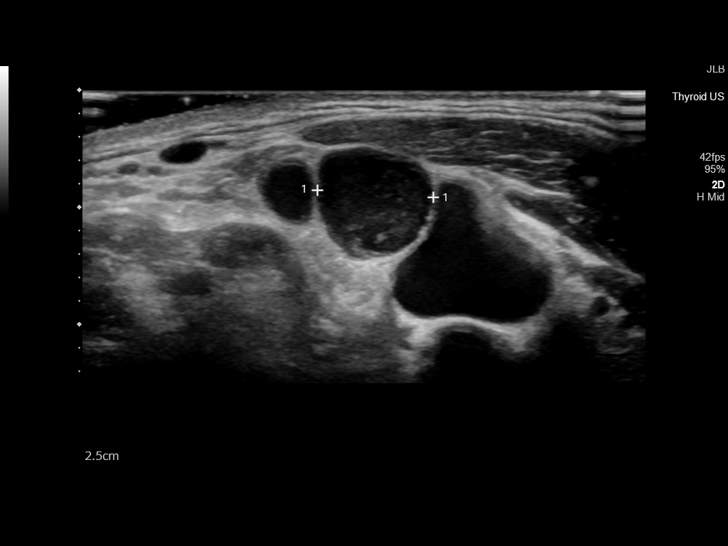
[im 28/38]
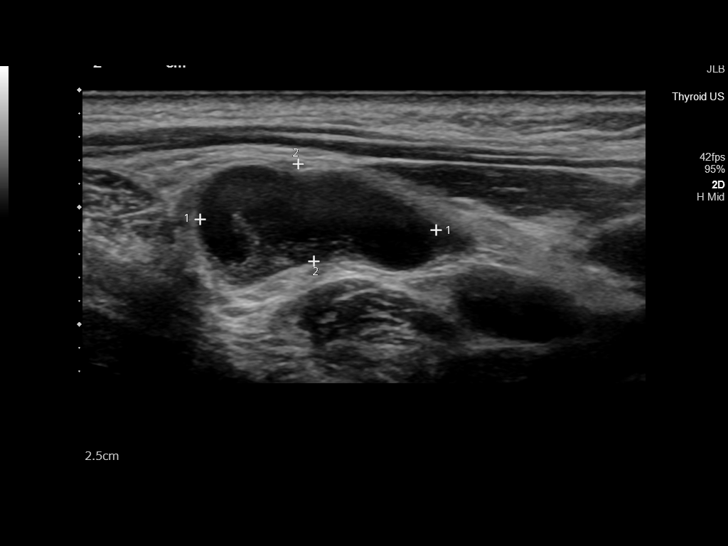
[im 31/38]
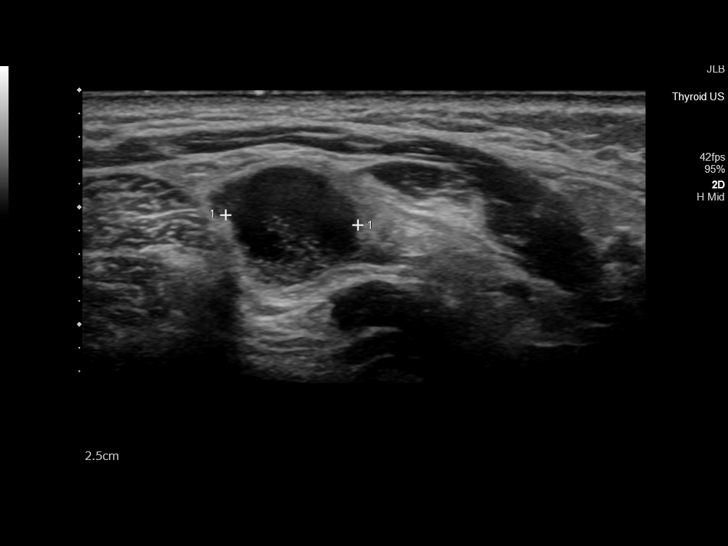
[im 34/38]
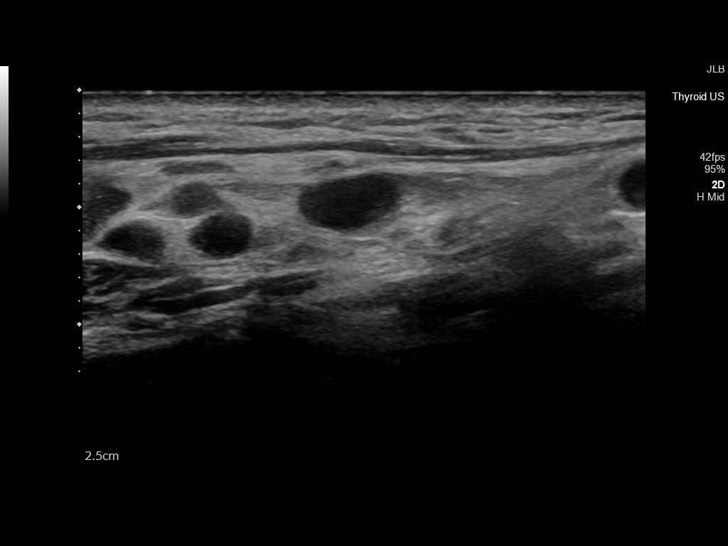
[im 38/38]
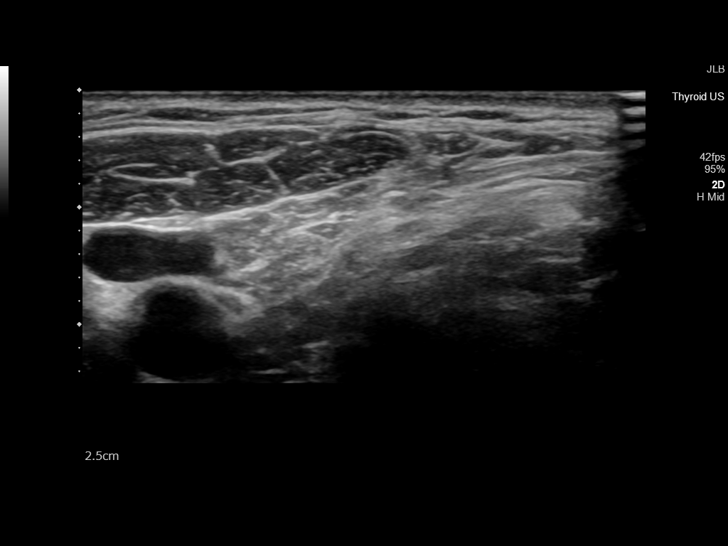

[14 of 25 positions shown; findings below may reference images not displayed]

FINDINGS: The study shows right neck lymphadenopathy in the level 2 level 3
region with multiple enlarged lymph nodes, many showing central
necrosis. The single largest node measures 2 x 1.1 x 0.8 mm. Whereas
the findings could relate to inflammatory disease, malignant
lymphadenopathy is not excluded particularly in a person of this
age. Neck CT with contrast recommended.
IMPRESSION: Multiple abnormal level 2 and level 3 lymph nodes on the right, some
with necrosis. Single largest node measures 2 x 1.1 x 0.8 mm. CT
scan of the neck with contrast suggested, as metastatic
lymphadenopathy is possible.

## 2022-06-01 IMAGING — CT CT NECK W/ CM
5 series · 16 of 33 positions shown, 18 images · IV contrast (omnipaque)
Comparison: Cervical ultrasound May 09, 2020.

CLINICAL DATA: Neck mass.

EXAM:
CT NECK WITH CONTRAST
TECHNIQUE: Multidetector CT imaging of the neck was performed using the
standard protocol following the bolus administration of intravenous
contrast.
CONTRAST:  75mL OMNIPAQUE IOHEXOL 300 MG/ML  SOLN

[Series 2: axial neck neck (person_name) 2.00 · axial · 0.62mm/px · z∈[-716,-588]mm · 3 of 128 slices shown]
[im 32/128  bone]
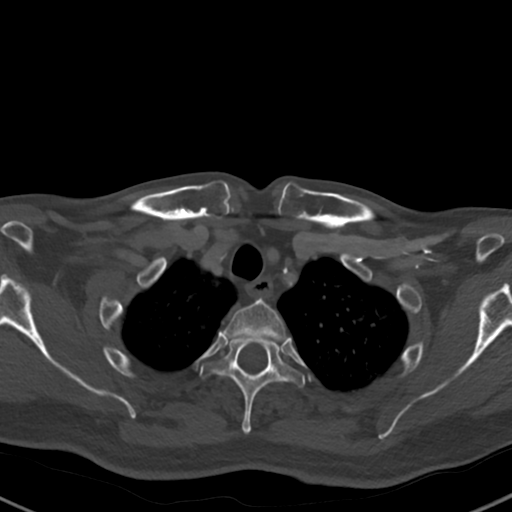
[im 64/128  bone]
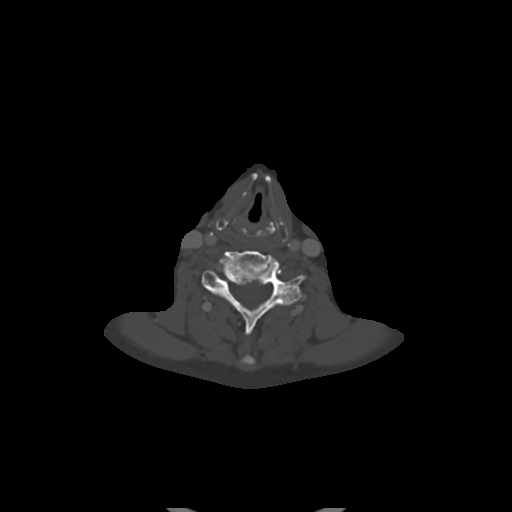
[im 96/128  bone]
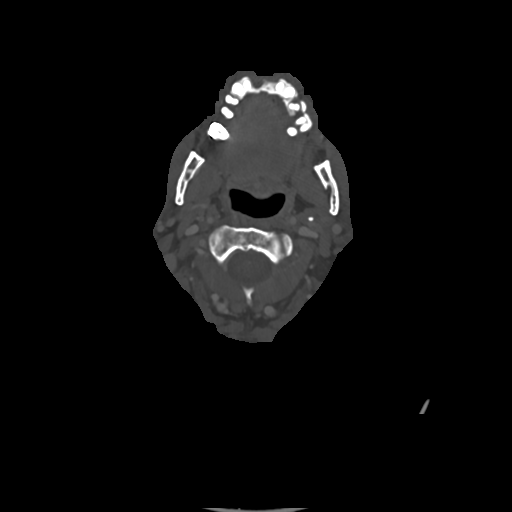

[Series 3: axial bone neck 2.00 · axial · 0.62mm/px · z∈[-694,-610]mm · 2 of 128 slices shown]
[im 43/128  bone]
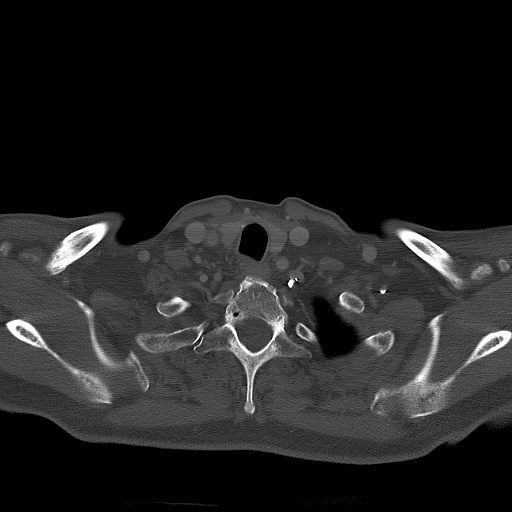
[im 85/128  bone]
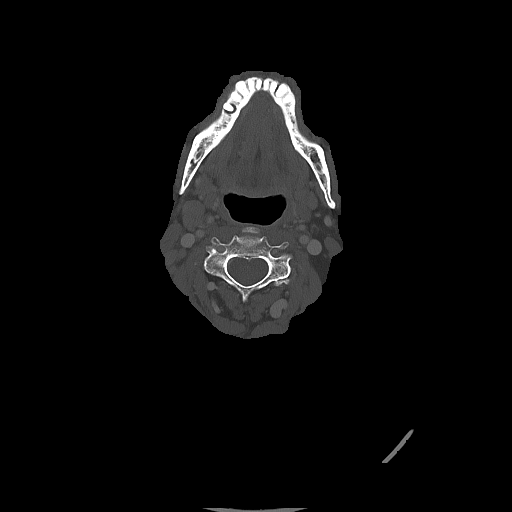

[Series 4: coronal neck neck (person_name) 2.00 cor · coronal · 0.61mm/px · 3 of 150 slices shown]
[im 51/150  bone]
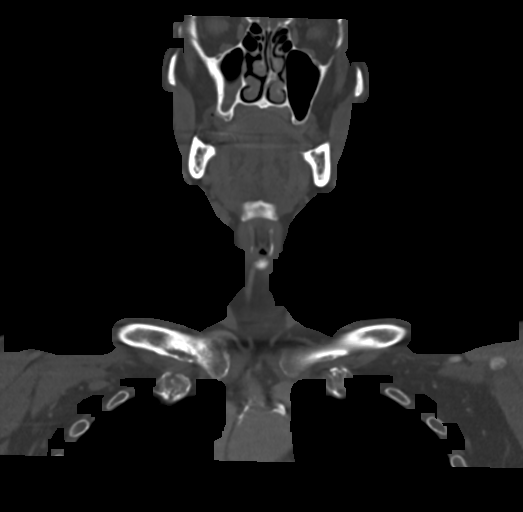
[im 67/150  bone]
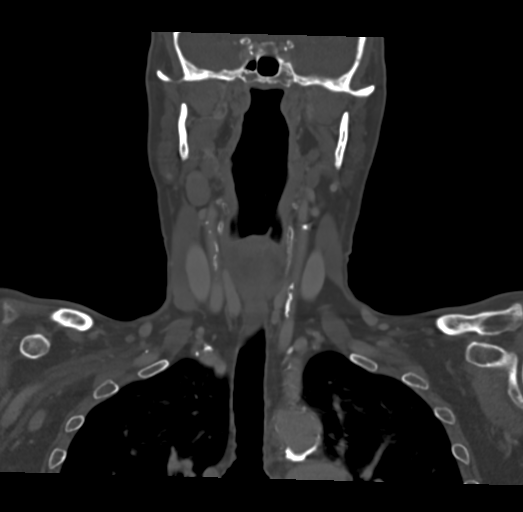
[im 83/150  bone]
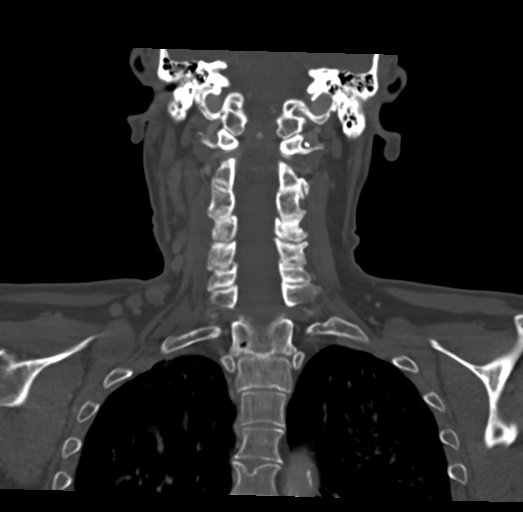

[Series 6: sagittal neck neck (person_name) 2.00 sag · sagittal · 0.59mm/px · 5 of 160 slices shown, 6 images]
[im 54/160  bone]
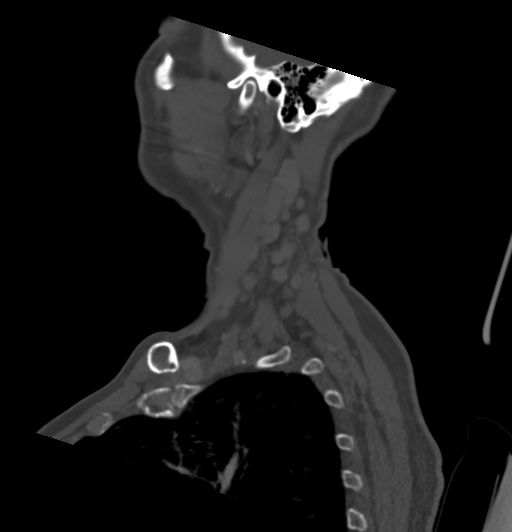
[im 67/160  bone]
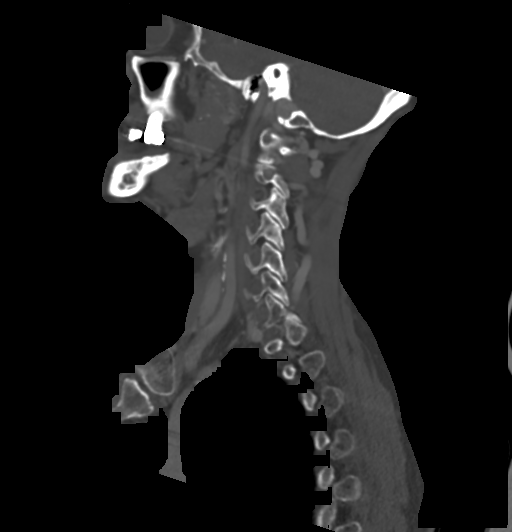
[im 80/160  soft-tissue]
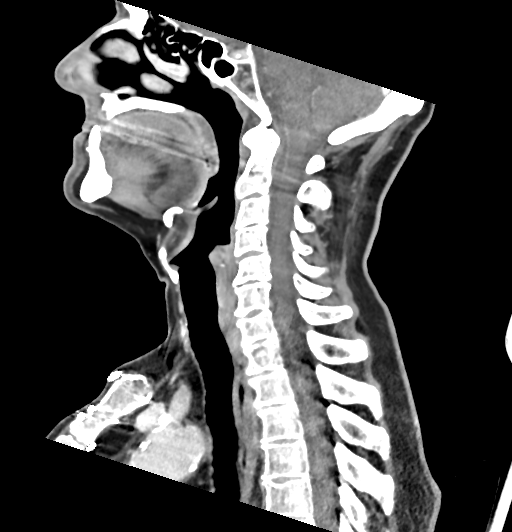
[im 80/160  bone]
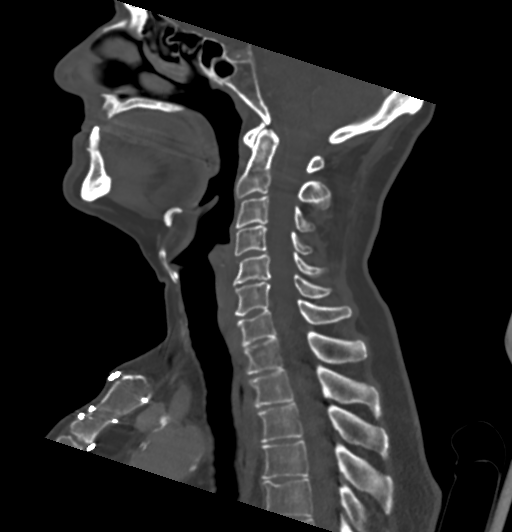
[im 93/160  bone]
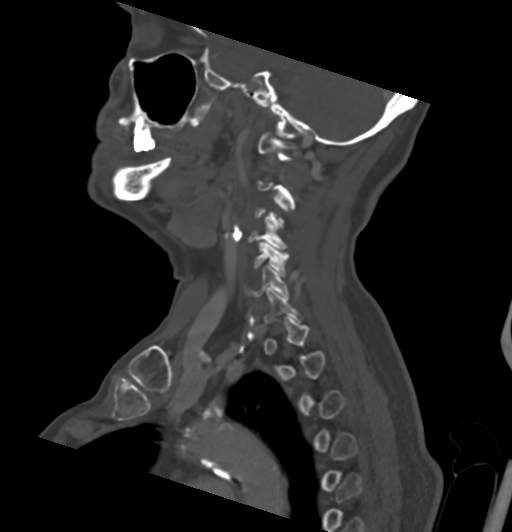
[im 107/160  bone]
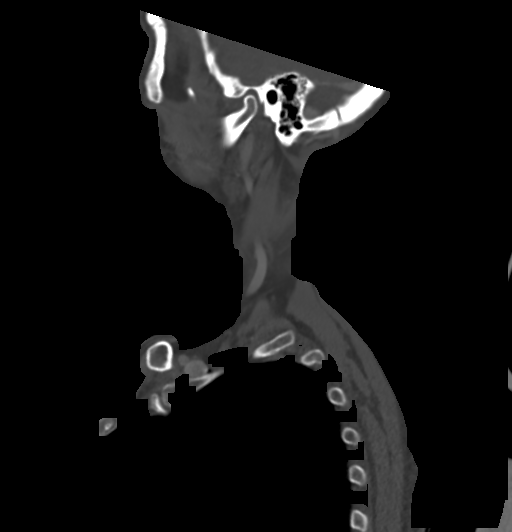

[Series 8: ax oropharynx neck neck (person_name) 2.00 ax · axial · 0.59mm/px · z∈[-771,-623]mm · 3 of 156 slices shown, 4 images]
[im 39/156  soft-tissue]
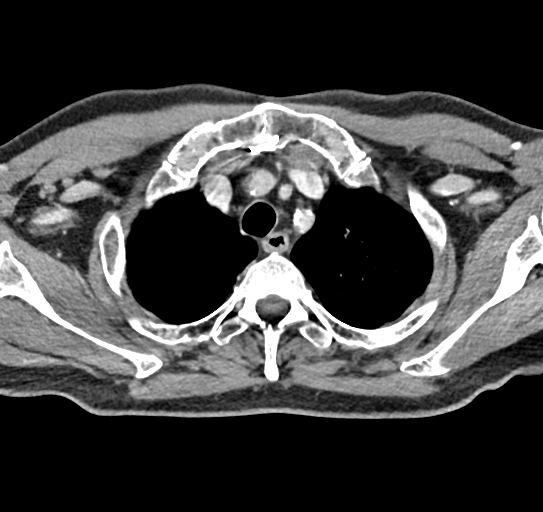
[im 39/156  bone]
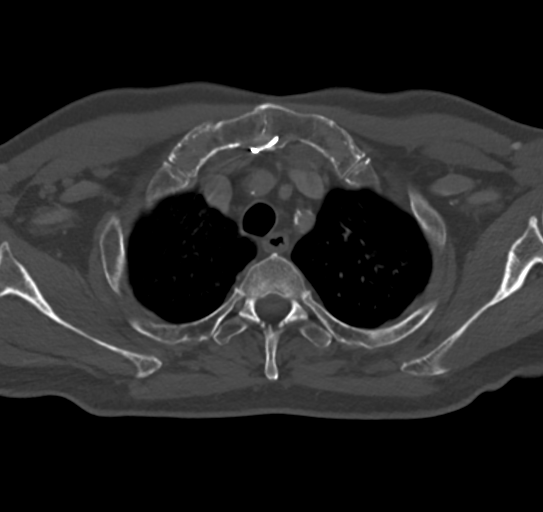
[im 78/156  bone]
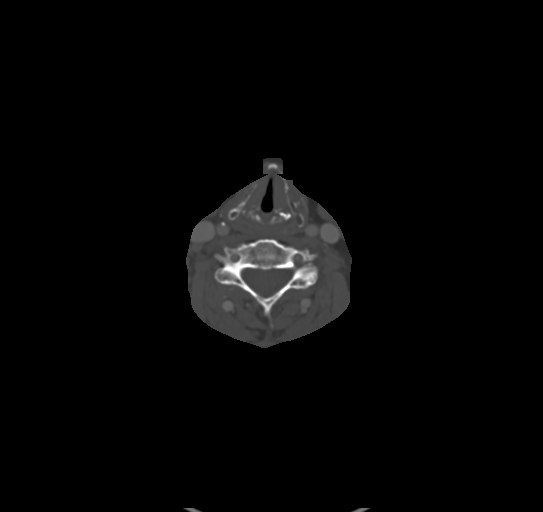
[im 117/156  bone]
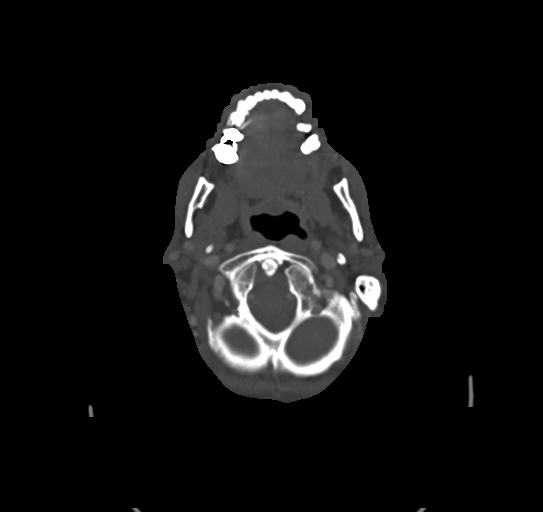

[16 of 33 positions shown; findings below may reference images not displayed]

FINDINGS: Pharynx and larynx: Normal. No mass or swelling.

Salivary glands: No inflammation, mass, or stone.

Thyroid: Normal.

Lymph nodes: Multiple enlarged lymph nodes are seen in levels IB,
II, II, IV and V on the right side.

Vascular: Calcified atherosclerotic plaques in the aortic arch,
major neck arteries origins and carotid bifurcations.

Limited intracranial: Negative.

Visualized orbits: Negative.

Mastoids and visualized paranasal sinuses: Thickening of the right
maxillary sinus walls with associated mucosal thickening and fluid
level, suggesting acute on chronic sinusitis. Mastoids are clear.

Skeleton: Degenerative changes of the cervical spine. Postsurgical
changes from prior sternotomy.

Upper chest: Centrilobular emphysema.
IMPRESSION: 1. Multiple enlarged lymph nodes in the right neck chains.
Metastatic lymphadenopathy cannot be excluded.
2. Acute on chronic right maxillary sinusitis.
3. Centrilobular emphysema.
4. Emphysema and aortic atherosclerosis.

Aortic Atherosclerosis (Q2N0G-FON.N) and Emphysema (Q2N0G-WQG.K).

## 2022-07-05 IMAGING — US US BIOPSY LYMPH NODE
1 series · 14 of 25 positions shown · non-contrast
Comparison: Neck CT-05/29/2020

MEDICATIONS:
None

ANESTHESIA/SEDATION:
None

COMPLICATIONS:
None immediate.

INDICATION: No known primary, now with right cervical lymphadenopathy. Please
perform ultrasound-guided biopsy for tissue diagnostic purposes.

EXAM:
ULTRASOUND-GUIDED RIGHT CERVICAL LYMPH NODE BIOPSY
TECHNIQUE: Informed written consent was obtained from the patient after a
discussion of the risks, benefits and alternatives to treatment.
Questions regarding the procedure were encouraged and answered.
Initial ultrasound scanning demonstrated several enlarged right
cervical lymph nodes. A dominant approximately 1.7 x 1.0 cm
hypoechoic right cervical lymph node, likely correlating with the
cervical lymph node seen on preceding neck CT image 79, series 2 was
targeted for biopsy given location and sonographic window. An
ultrasound image was saved for documentation purposes. The procedure
was planned. A timeout was performed prior to the initiation of the
procedure.

[Series 1: us biopsy · 14 of 26 slices shown]
[im 1/26]
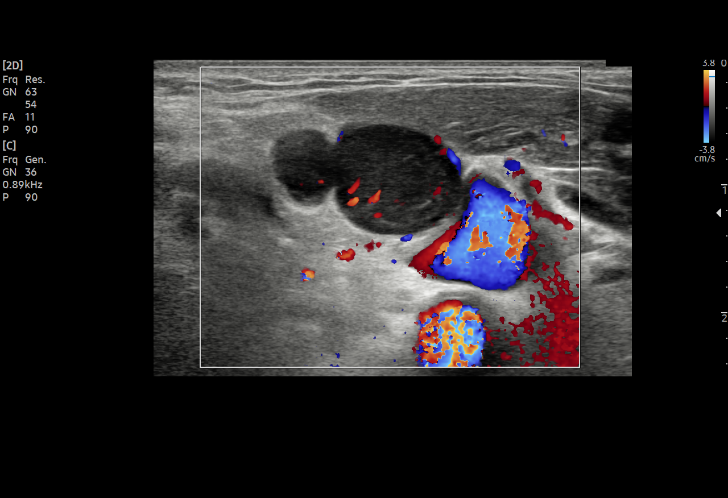
[im 3/26]
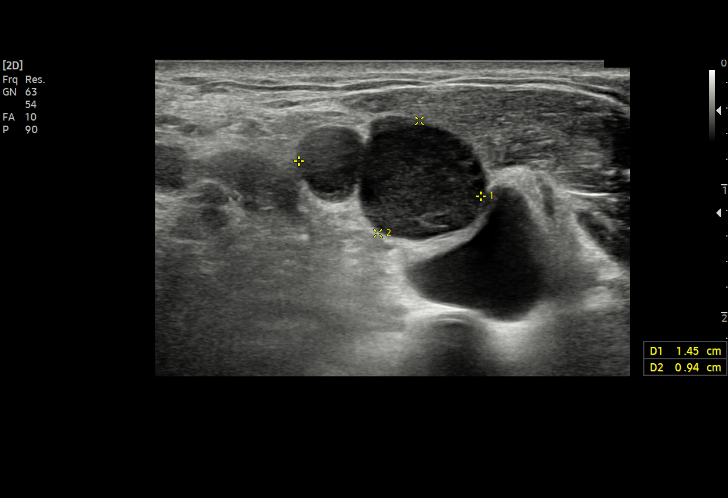
[im 5/26]
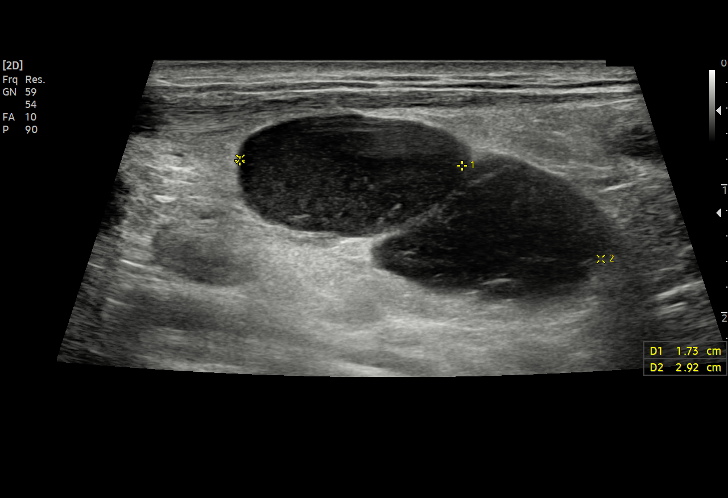
[im 7/26]
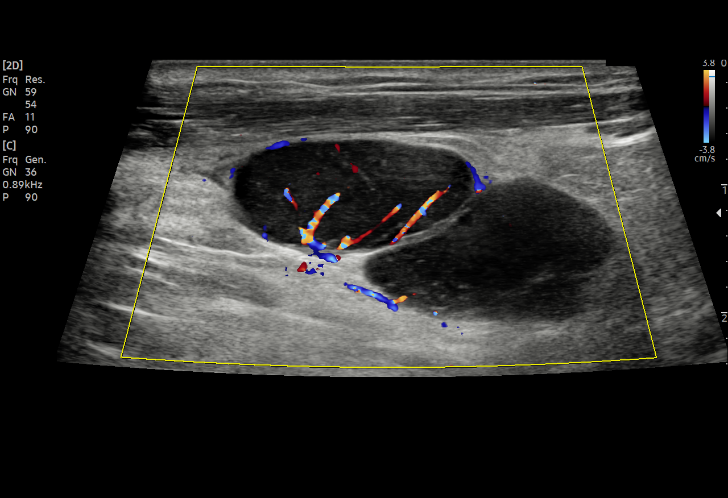
[im 9/26]
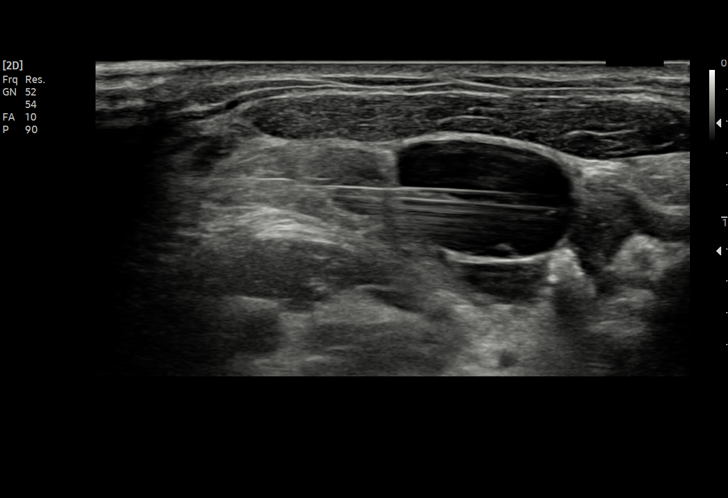
[im 10/26]
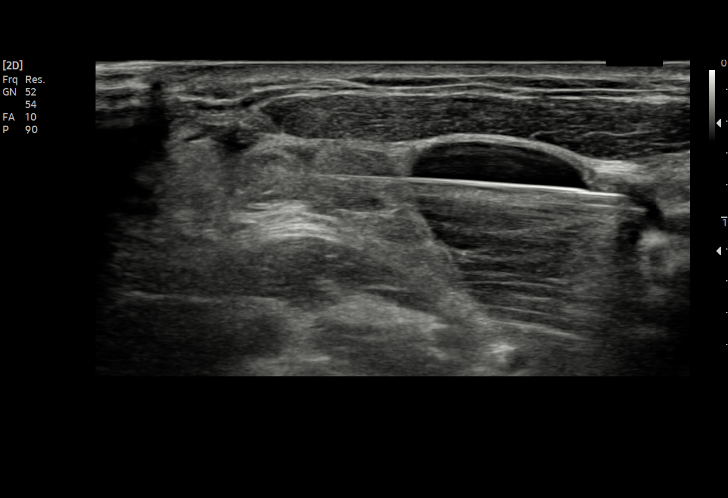
[im 12/26]
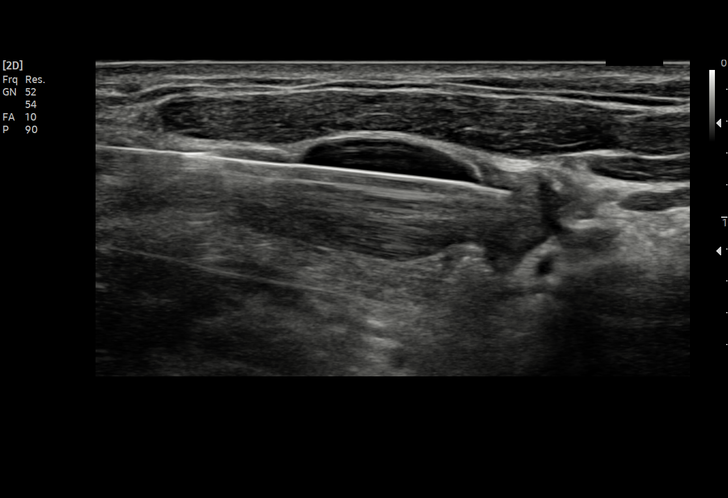
[im 14/26]
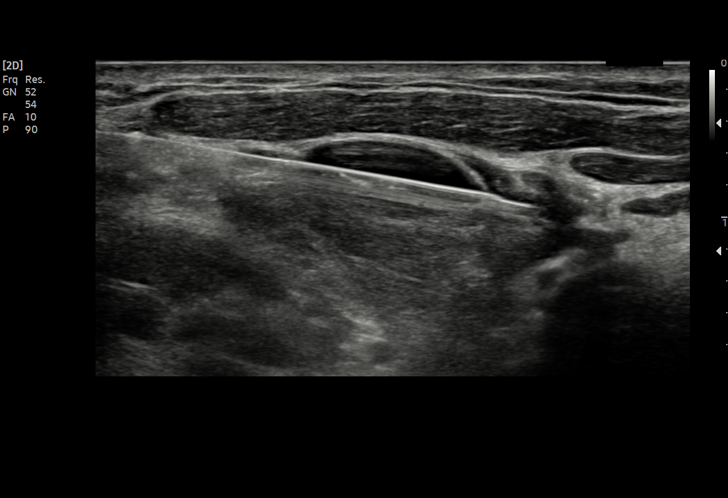
[im 16/26]
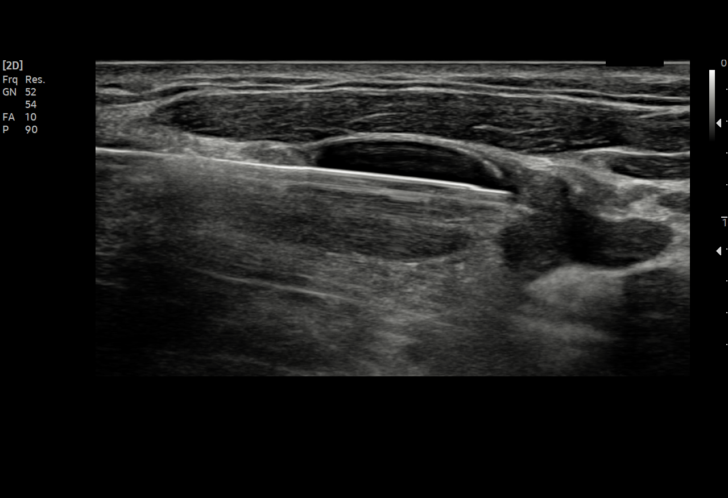
[im 17/26]
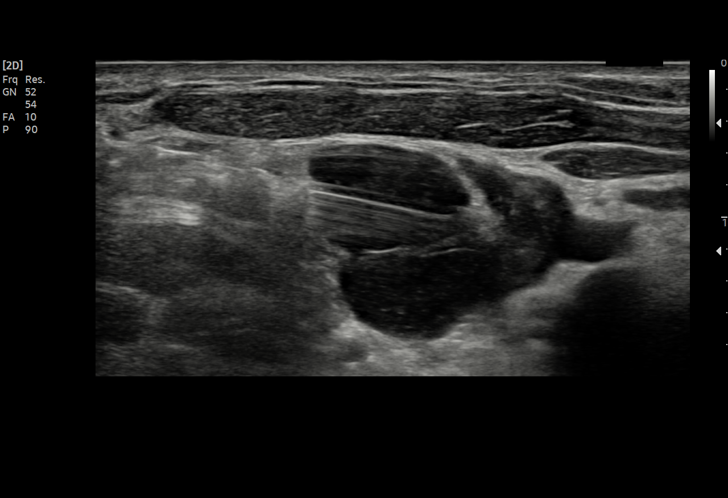
[im 19/26]
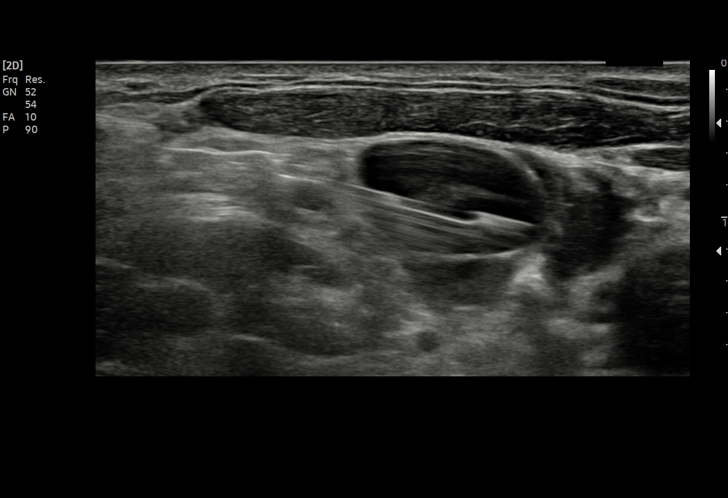
[im 21/26]
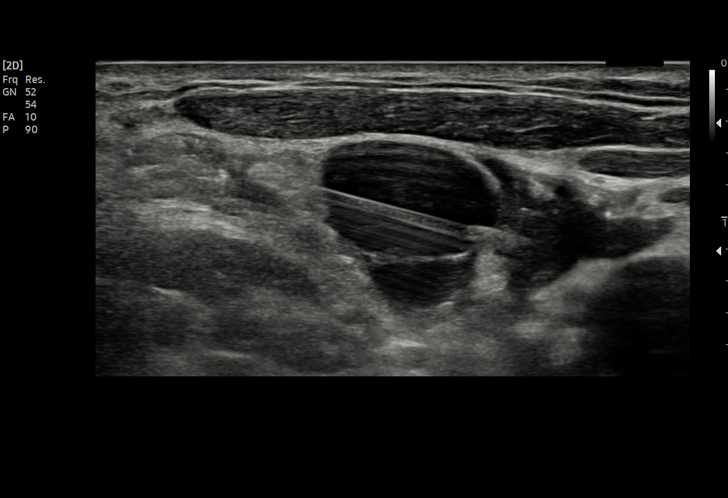
[im 23/26]
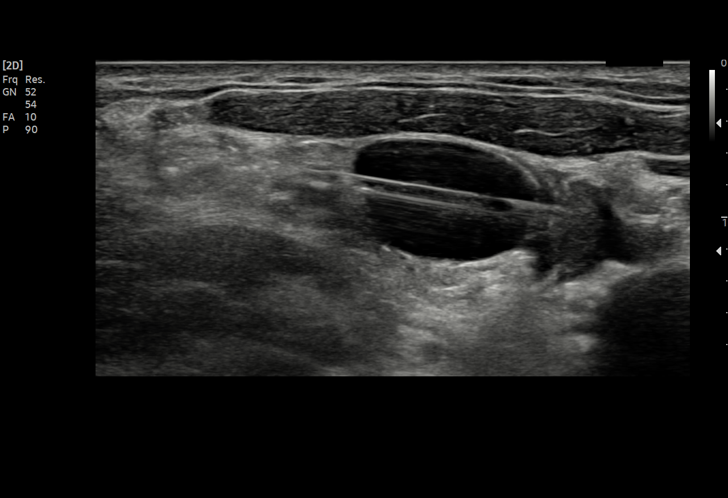
[im 26/26]
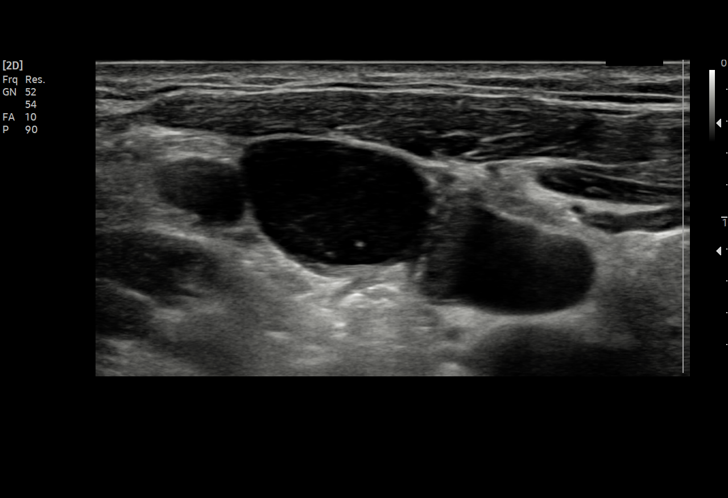

[14 of 25 positions shown; findings below may reference images not displayed]

The operative was prepped and draped in the usual sterile fashion,
and a sterile drape was applied covering the operative field. A
timeout was performed prior to the initiation of the procedure.
Local anesthesia was provided with 1% lidocaine with epinephrine.

Under direct ultrasound guidance, an 18 gauge core needle device was
utilized to obtain to obtain 8 core needle biopsies of the
indeterminate right cervical lymph node.

The samples were placed in saline and submitted to pathology. The
needle was removed and hemostasis was achieved with manual
compression. Post procedure scan was negative for significant
hematoma. A dressing was placed. The patient tolerated the procedure
well without immediate postprocedural complication.
IMPRESSION: Technically successful ultrasound guided biopsy of indeterminate
right cervical lymph node.
